# Patient Record
Sex: Male | Born: 1945 | Race: White | Hispanic: No | Marital: Married | Smoking: Never smoker
Health system: Southern US, Community
[De-identification: ages and names within clinical notes are randomized; demographics above are authoritative.]

## PROBLEM LIST (undated history)

## (undated) DIAGNOSIS — K2970 Gastritis, unspecified, without bleeding: Secondary | ICD-10-CM

## (undated) DIAGNOSIS — F0280 Dementia in other diseases classified elsewhere without behavioral disturbance: Secondary | ICD-10-CM

## (undated) DIAGNOSIS — G20A1 Parkinson's disease without dyskinesia, without mention of fluctuations: Secondary | ICD-10-CM

## (undated) DIAGNOSIS — G629 Polyneuropathy, unspecified: Secondary | ICD-10-CM

## (undated) DIAGNOSIS — M5030 Other cervical disc degeneration, unspecified cervical region: Secondary | ICD-10-CM

## (undated) DIAGNOSIS — G20 Parkinson's disease: Secondary | ICD-10-CM

## (undated) DIAGNOSIS — G459 Transient cerebral ischemic attack, unspecified: Secondary | ICD-10-CM

## (undated) DIAGNOSIS — G20C Parkinsonism, unspecified: Secondary | ICD-10-CM

## (undated) DIAGNOSIS — E785 Hyperlipidemia, unspecified: Secondary | ICD-10-CM

## (undated) DIAGNOSIS — IMO0002 Reserved for concepts with insufficient information to code with codable children: Secondary | ICD-10-CM

## (undated) DIAGNOSIS — E538 Deficiency of other specified B group vitamins: Secondary | ICD-10-CM

## (undated) HISTORY — DX: Parkinson's disease without dyskinesia, without mention of fluctuations: G20.A1

## (undated) HISTORY — DX: Polyneuropathy, unspecified: G62.9

## (undated) HISTORY — DX: Gastritis, unspecified, without bleeding: K29.70

## (undated) HISTORY — PX: APPENDECTOMY: SHX54

## (undated) HISTORY — PX: BACK SURGERY: SHX140

## (undated) HISTORY — DX: Transient cerebral ischemic attack, unspecified: G45.9

## (undated) HISTORY — DX: Reserved for concepts with insufficient information to code with codable children: IMO0002

## (undated) HISTORY — DX: Deficiency of other specified B group vitamins: E53.8

## (undated) HISTORY — DX: Hyperlipidemia, unspecified: E78.5

## (undated) HISTORY — DX: Parkinson's disease: G20

## (undated) HISTORY — DX: Dementia in other diseases classified elsewhere, unspecified severity, without behavioral disturbance, psychotic disturbance, mood disturbance, and anxiety: F02.80

## (undated) HISTORY — DX: Parkinsonism, unspecified: G20.C

---

## 2001-01-24 ENCOUNTER — Encounter: Payer: Self-pay | Admitting: Emergency Medicine

## 2001-01-24 ENCOUNTER — Emergency Department (HOSPITAL_COMMUNITY): Admission: EM | Admit: 2001-01-24 | Discharge: 2001-01-24 | Payer: Self-pay | Admitting: Emergency Medicine

## 2005-02-26 ENCOUNTER — Emergency Department (HOSPITAL_COMMUNITY): Admission: EM | Admit: 2005-02-26 | Discharge: 2005-02-26 | Payer: Self-pay | Admitting: Family Medicine

## 2005-03-02 ENCOUNTER — Emergency Department (HOSPITAL_COMMUNITY): Admission: EM | Admit: 2005-03-02 | Discharge: 2005-03-02 | Payer: Self-pay | Admitting: Family Medicine

## 2006-08-19 ENCOUNTER — Emergency Department (HOSPITAL_COMMUNITY): Admission: EM | Admit: 2006-08-19 | Discharge: 2006-08-19 | Payer: Self-pay | Admitting: Family Medicine

## 2006-09-28 ENCOUNTER — Encounter: Admission: RE | Admit: 2006-09-28 | Discharge: 2006-09-28 | Payer: Self-pay | Admitting: Neurosurgery

## 2006-10-02 ENCOUNTER — Ambulatory Visit (HOSPITAL_COMMUNITY): Admission: RE | Admit: 2006-10-02 | Discharge: 2006-10-03 | Payer: Self-pay | Admitting: Neurosurgery

## 2007-01-05 ENCOUNTER — Encounter: Admission: RE | Admit: 2007-01-05 | Discharge: 2007-02-04 | Payer: Self-pay | Admitting: Neurology

## 2008-11-01 ENCOUNTER — Encounter: Admission: RE | Admit: 2008-11-01 | Discharge: 2008-11-01 | Payer: Self-pay | Admitting: Family Medicine

## 2008-11-06 ENCOUNTER — Emergency Department (HOSPITAL_COMMUNITY): Admission: EM | Admit: 2008-11-06 | Discharge: 2008-11-06 | Payer: Self-pay | Admitting: Emergency Medicine

## 2008-11-24 ENCOUNTER — Encounter: Admission: RE | Admit: 2008-11-24 | Discharge: 2008-11-24 | Payer: Self-pay | Admitting: Gastroenterology

## 2008-12-07 LAB — HM COLONOSCOPY

## 2010-03-25 ENCOUNTER — Encounter (HOSPITAL_COMMUNITY): Admission: RE | Admit: 2010-03-25 | Discharge: 2010-04-24 | Payer: Self-pay | Admitting: Family Medicine

## 2010-04-25 ENCOUNTER — Encounter (HOSPITAL_COMMUNITY): Admission: RE | Admit: 2010-04-25 | Discharge: 2010-05-25 | Payer: Self-pay | Admitting: Family Medicine

## 2010-12-19 LAB — DIFFERENTIAL
Basophils Absolute: 0 10*3/uL (ref 0.0–0.1)
Eosinophils Absolute: 0.1 10*3/uL (ref 0.0–0.7)
Lymphocytes Relative: 18 % (ref 12–46)
Lymphs Abs: 0.9 10*3/uL (ref 0.7–4.0)
Monocytes Absolute: 0.5 10*3/uL (ref 0.1–1.0)
Neutro Abs: 3.7 10*3/uL (ref 1.7–7.7)

## 2010-12-19 LAB — CBC
HCT: 38.7 % — ABNORMAL LOW (ref 39.0–52.0)
MCHC: 34.8 g/dL (ref 30.0–36.0)
MCV: 86.6 fL (ref 78.0–100.0)
Platelets: 194 10*3/uL (ref 150–400)
RDW: 14 % (ref 11.5–15.5)
WBC: 5.3 10*3/uL (ref 4.0–10.5)

## 2010-12-19 LAB — COMPREHENSIVE METABOLIC PANEL
AST: 24 U/L (ref 0–37)
Alkaline Phosphatase: 64 U/L (ref 39–117)
Chloride: 102 mEq/L (ref 96–112)
Glucose, Bld: 99 mg/dL (ref 70–99)
Potassium: 4.2 mEq/L (ref 3.5–5.1)
Sodium: 138 mEq/L (ref 135–145)
Total Protein: 5.9 g/dL — ABNORMAL LOW (ref 6.0–8.3)

## 2010-12-19 LAB — URINALYSIS, ROUTINE W REFLEX MICROSCOPIC
Bilirubin Urine: NEGATIVE
Hgb urine dipstick: NEGATIVE
pH: 6 (ref 5.0–8.0)

## 2010-12-19 LAB — URINE MICROSCOPIC-ADD ON

## 2011-01-24 NOTE — Op Note (Signed)
Alex Frazier, Alex Frazier                ACCOUNT NO.:  [id number]   MEDICAL RECORD NO.:  [id number]          PATIENT TYPE:  OIB   LOCATION:  3002                         FACILITY:  [facility]   PHYSICIAN:  Kathaleen Maser. Pool, M.D.    DATE OF BIRTH:  07/29/1946   DATE OF PROCEDURE:  10/02/2006  DATE OF DISCHARGE:                               OPERATIVE REPORT   PREOPERATIVE DIAGNOSIS:  L4-L5 stenosis.   POSTOPERATIVE DIAGNOSIS:  L4-L5 stenosis.   PROCEDURE NOTE:  Bilateral L4-L5 decompressive laminotomies and  foraminotomies.   SURGEON:  Kathaleen Maser. Pool, M.D.   ASSISTANT:  Tia Alert, M.D.   ANESTHESIA:  General endotracheal anesthesia.   INDICATIONS FOR PROCEDURE:  Mr. Code is a 65 year old male with a  history of back and bilateral lower extremity pain, paresthesias and  weakness consistent with L5 radiculopathies bilaterally.  The patient  failed conservative management.  Workup demonstrates evidence of lateral  recess stenosis bilaterally affecting both L5 nerve roots.  The patient  presents now for bilateral decompressive laminotomies and foraminotomies  in hopes of improving his symptoms.   OPERATIVE NOTE:  The patient was taken to the operating room and placed  on the operating table in the supine position.  After an adequate level  of anesthesia was achieved, the patient was positioned prone onto a  Wilson frame and appropriately padded.  The patient's lumbar region was  prepped and draped sterilely.  A 10 blade was used to make a linear  incision overlying L4-L5 interspace.  This was carried down sharply and  subperiosteal dissection performed bilaterally exposing the lamina and  facet joints of L4-L5.  Deep self-retaining retractor was placed.  Interoperative x-ray was taken and the level was confirmed.   Decompressive laminotomies were then performed bilaterally using a high  speed drill and Kerrison rongeurs to remove the inferior aspect of the  lamina L4, medial aspect of  the L4-L5 facet joint, and the superior rim  of L5 lamina.  The ligamentum flavum was then elevated and resected in  piecemeal fashion using Kerrison rongeurs.  The underlying thecal sac  and exiting L5 nerve roots were identified.  Wide decompressive  foraminotomy was then performed along the course of the exiting L5 nerve  roots bilaterally.  At this point, a very thorough decompression had  been achieved.  The disc space was examined and found to be free from  herniation.  There was no residual stenosis.  There was no evidence of  injury to the thecal sac or nerve roots.  The wound was then irrigated  with antibiotic solution.  Gelfoam was placed topically for hemostasis  which was found to be good.  The microscope and  retractor system were removed.  Hemostasis in the muscle was achieved  with electrocautery. The wound was then closed in layers with Vicryl  sutures.  Steri-Strips and sterile dressing were applied.  There were no  interoperative complications.  The patient tolerated the procedure well  and he returns to the recovery room for postoperative care.  ______________________________  Kathaleen Maser Pool, M.D.     HAP/MEDQ  D:  10/02/2006  T:  10/02/2006  Job:  [id number]

## 2011-03-18 ENCOUNTER — Encounter: Payer: Self-pay | Admitting: Family Medicine

## 2011-03-18 DIAGNOSIS — G20 Parkinson's disease: Secondary | ICD-10-CM | POA: Insufficient documentation

## 2011-03-18 DIAGNOSIS — K2970 Gastritis, unspecified, without bleeding: Secondary | ICD-10-CM | POA: Insufficient documentation

## 2011-03-18 DIAGNOSIS — IMO0002 Reserved for concepts with insufficient information to code with codable children: Secondary | ICD-10-CM | POA: Insufficient documentation

## 2011-03-18 DIAGNOSIS — E785 Hyperlipidemia, unspecified: Secondary | ICD-10-CM | POA: Insufficient documentation

## 2011-07-08 DIAGNOSIS — G609 Hereditary and idiopathic neuropathy, unspecified: Secondary | ICD-10-CM | POA: Insufficient documentation

## 2011-07-08 DIAGNOSIS — G20A1 Parkinson's disease without dyskinesia, without mention of fluctuations: Secondary | ICD-10-CM | POA: Insufficient documentation

## 2011-07-08 DIAGNOSIS — G20C Parkinsonism, unspecified: Secondary | ICD-10-CM | POA: Insufficient documentation

## 2012-01-12 DIAGNOSIS — E785 Hyperlipidemia, unspecified: Secondary | ICD-10-CM | POA: Diagnosis not present

## 2012-01-12 DIAGNOSIS — G609 Hereditary and idiopathic neuropathy, unspecified: Secondary | ICD-10-CM | POA: Diagnosis not present

## 2012-01-12 DIAGNOSIS — G20 Parkinson's disease: Secondary | ICD-10-CM | POA: Diagnosis not present

## 2012-03-09 DIAGNOSIS — G20 Parkinson's disease: Secondary | ICD-10-CM | POA: Diagnosis not present

## 2012-03-09 DIAGNOSIS — G609 Hereditary and idiopathic neuropathy, unspecified: Secondary | ICD-10-CM | POA: Diagnosis not present

## 2012-05-12 ENCOUNTER — Ambulatory Visit
Admission: RE | Admit: 2012-05-12 | Discharge: 2012-05-12 | Disposition: A | Discharge status: Home / Self Care | Payer: Medicare Other | Source: Ambulatory Visit | Attending: Family Medicine | Admitting: Family Medicine

## 2012-05-12 ENCOUNTER — Other Ambulatory Visit: Payer: Self-pay | Admitting: Family Medicine

## 2012-05-12 DIAGNOSIS — T1490XA Injury, unspecified, initial encounter: Secondary | ICD-10-CM

## 2012-05-12 DIAGNOSIS — S51809A Unspecified open wound of unspecified forearm, initial encounter: Secondary | ICD-10-CM | POA: Diagnosis not present

## 2012-05-12 DIAGNOSIS — M25529 Pain in unspecified elbow: Secondary | ICD-10-CM | POA: Diagnosis not present

## 2012-05-14 DIAGNOSIS — M7150 Other bursitis, not elsewhere classified, unspecified site: Secondary | ICD-10-CM | POA: Diagnosis not present

## 2012-05-18 DIAGNOSIS — M7150 Other bursitis, not elsewhere classified, unspecified site: Secondary | ICD-10-CM | POA: Diagnosis not present

## 2012-06-02 DIAGNOSIS — Z23 Encounter for immunization: Secondary | ICD-10-CM | POA: Diagnosis not present

## 2012-07-26 DIAGNOSIS — H25019 Cortical age-related cataract, unspecified eye: Secondary | ICD-10-CM | POA: Diagnosis not present

## 2012-07-26 DIAGNOSIS — H2510 Age-related nuclear cataract, unspecified eye: Secondary | ICD-10-CM | POA: Diagnosis not present

## 2012-07-26 DIAGNOSIS — H52209 Unspecified astigmatism, unspecified eye: Secondary | ICD-10-CM | POA: Diagnosis not present

## 2012-11-07 ENCOUNTER — Encounter: Payer: Self-pay | Admitting: *Deleted

## 2012-12-14 DIAGNOSIS — G609 Hereditary and idiopathic neuropathy, unspecified: Secondary | ICD-10-CM | POA: Diagnosis not present

## 2012-12-14 DIAGNOSIS — G20 Parkinson's disease: Secondary | ICD-10-CM | POA: Diagnosis not present

## 2012-12-14 DIAGNOSIS — G20A1 Parkinson's disease without dyskinesia, without mention of fluctuations: Secondary | ICD-10-CM | POA: Diagnosis not present

## 2013-01-05 ENCOUNTER — Telehealth: Payer: Self-pay | Admitting: Family Medicine

## 2013-01-05 MED ORDER — CARBIDOPA-LEVODOPA 25-100 MG PO TABS: 1.0000 | ORAL_TABLET | Freq: Three times a day (TID) | ORAL | Status: DC

## 2013-01-05 NOTE — Telephone Encounter (Signed)
Rx Refilled  

## 2013-01-07 ENCOUNTER — Encounter: Payer: Self-pay | Admitting: Family Medicine

## 2013-01-07 ENCOUNTER — Ambulatory Visit (INDEPENDENT_AMBULATORY_CARE_PROVIDER_SITE_OTHER): Payer: Medicare Other | Admitting: Family Medicine

## 2013-01-07 VITALS — BP 110/72 | HR 70 | Temp 98.0°F | Resp 14 | Wt 165.0 lb

## 2013-01-07 DIAGNOSIS — E785 Hyperlipidemia, unspecified: Secondary | ICD-10-CM

## 2013-01-07 LAB — BASIC METABOLIC PANEL
BUN: 17 mg/dL (ref 6–23)
Creat: 0.87 mg/dL (ref 0.50–1.35)
Potassium: 4.9 mEq/L (ref 3.5–5.3)
Sodium: 140 mEq/L (ref 135–145)

## 2013-01-07 LAB — HEPATIC FUNCTION PANEL
ALT: 25 U/L (ref 0–53)
AST: 24 U/L (ref 0–37)
Alkaline Phosphatase: 86 U/L (ref 39–117)
Bilirubin, Direct: 0.1 mg/dL (ref 0.0–0.3)
Total Bilirubin: 0.5 mg/dL (ref 0.3–1.2)

## 2013-01-07 LAB — LIPID PANEL
Cholesterol: 135 mg/dL (ref 0–200)
HDL: 41 mg/dL (ref >39)

## 2013-01-07 NOTE — Progress Notes (Signed)
Subjective:    Patient ID: Alex Frazier, male    DOB: [birth year], 67 y.o.   MRN: [id number]  HPI Patient is here for followup of his hyperlipidemia. He is currently taking pravastatin 80 mg by mouth daily. He denies any myalgias or right quadrant pain. Of note his Parkinson's disease seems to be worsening. He hasn't a pronounced resting tremor in his right upper extremity. He is reporting more disequilibrium. Having more frequent falls. He is currently taking Sinemet immediate release 25/100 by mouth 3 times a day.  He is also taking Cymbalta 60 mg a day and let us 75 mg twice a day for neuropathy.  He is scheduled to see the Parkinson's disease specialist at [facility] in July.  Past Medical History  Diagnosis Date  . Bulging discs   . Gastritis   . Parkinsonian syndrome   . Hyperlipidemia   . Peripheral neuropathy   . B12 deficiency     Borderline   Current Outpatient Prescriptions on File Prior to Visit  Medication Sig Dispense Refill  . aspirin 325 MG tablet Take 325 mg by mouth daily.        . carbidopa-levodopa (SINEMET IR) 25-100 MG per tablet Take 1 tablet by mouth 3 (three) times daily.  90 tablet  5  . DULoxetine (CYMBALTA) 60 MG capsule Take 60 mg by mouth daily.      . pravastatin (PRAVACHOL) 80 MG tablet Take 80 mg by mouth daily.         No current facility-administered medications on file prior to visit.   Allergies  Allergen Reactions  . Cymbalta (Duloxetine Hcl)   . Indomethacin   . Lyrica (Pregabalin)    History   Social History  . Marital Status: Married    Spouse Name: N/A    Number of Children: N/A  . Years of Education: N/A   Occupational History  . Not on file.   Social History Main Topics  . Smoking status: Never Smoker   . Smokeless tobacco: Not on file  . Alcohol Use: No  . Drug Use: No  . Sexually Active: Not on file   Other Topics Concern  . Not on file   Social History Narrative  . No narrative on file      Review of  Systems  All other systems reviewed and are negative.       Objective:   Physical Exam  Vitals reviewed. Constitutional: He is oriented to person, place, and time. He appears well-developed and well-nourished.  HENT:  Head: Normocephalic.  Right Ear: External ear normal.  Left Ear: External ear normal.  Nose: Nose normal.  Mouth/Throat: Oropharynx is clear and moist.  Neck: Neck supple. No JVD present. No thyromegaly present.  Cardiovascular: Normal rate, regular rhythm and normal heart sounds.   No murmur heard. Pulmonary/Chest: Effort normal and breath sounds normal. No respiratory distress. He has no wheezes. He has no rales.  Abdominal: Soft. Bowel sounds are normal. He exhibits no distension. There is no tenderness. There is no rebound.  Lymphadenopathy:    He has no cervical adenopathy.  Neurological: He is alert and oriented to person, place, and time.   patient has a flat affect. He also has slowing of his facial expressions. He has decreased eye blinking. He has a resting tremor in his entire right upper extremity.  He has a slightly ataxic gait. He is walking with a cane.        Assessment & Plan:  1. HLD (hyperlipidemia) Check a lipid panel. His goal LDL is less than 1:30. From a general medical standpoint patient seems to be stable. I am concerned about his worsening symptoms related to Parkinson's.  We could consider increasing the Sinemet versus adding Mirapex. I would like to defer this decision to the specialist at [facility]. If there is a delay in seeing a specialist we could empirically try one of these options to this clinic. - Basic Metabolic Panel - Hepatic Function Panel - Lipid Panel

## 2013-03-25 DIAGNOSIS — G20 Parkinson's disease: Secondary | ICD-10-CM | POA: Diagnosis not present

## 2013-03-25 DIAGNOSIS — G4752 REM sleep behavior disorder: Secondary | ICD-10-CM | POA: Diagnosis not present

## 2013-03-30 ENCOUNTER — Encounter: Payer: Self-pay | Admitting: Family Medicine

## 2013-03-30 ENCOUNTER — Telehealth: Payer: Self-pay | Admitting: Family Medicine

## 2013-03-31 MED ORDER — PREGABALIN 75 MG PO CAPS: 75.0000 mg | ORAL_CAPSULE | Freq: Two times a day (BID) | ORAL | Status: DC

## 2013-03-31 MED ORDER — DULOXETINE HCL 60 MG PO CPEP: 60.0000 mg | ORAL_CAPSULE | Freq: Every day | ORAL | Status: DC

## 2013-03-31 NOTE — Telephone Encounter (Signed)
Rx Refilled and lyrica faxed

## 2013-04-04 ENCOUNTER — Encounter: Payer: Self-pay | Admitting: Family Medicine

## 2013-04-04 ENCOUNTER — Ambulatory Visit (INDEPENDENT_AMBULATORY_CARE_PROVIDER_SITE_OTHER): Payer: Medicare Other | Admitting: Family Medicine

## 2013-04-04 VITALS — BP 110/68 | HR 78 | Temp 98.2°F | Resp 16 | Wt 167.0 lb

## 2013-04-04 DIAGNOSIS — G629 Polyneuropathy, unspecified: Secondary | ICD-10-CM | POA: Insufficient documentation

## 2013-04-04 DIAGNOSIS — L723 Sebaceous cyst: Secondary | ICD-10-CM

## 2013-04-04 MED ORDER — SULFAMETHOXAZOLE-TMP DS 800-160 MG PO TABS: 1.0000 | ORAL_TABLET | Freq: Two times a day (BID) | ORAL | Status: DC

## 2013-04-04 NOTE — Progress Notes (Signed)
  Subjective:    Patient ID: Alex Frazier, male    DOB: 1946/02/17, 67 y.o.   MRN: [id number]  HPI  Patient has had a history of a small sebaceous cyst on the right posterior neck around the level of C5 for years.  Recently he began "picking" at the cyst.  The area is now red hot fluctuant and painful for 4 days. He denies fevers chills nausea or vomiting.  He denies headache or neck pain.  Past Medical History  Diagnosis Date  . Bulging discs   . Gastritis   . Parkinsonian syndrome   . Hyperlipidemia   . Peripheral neuropathy   . B12 deficiency     Borderline  . Neuropathy    Current Outpatient Prescriptions on File Prior to Visit  Medication Sig Dispense Refill  . aspirin 325 MG tablet Take 325 mg by mouth daily.        Marland Kitchen docusate sodium (COLACE) 50 MG capsule Take by mouth 2 (two) times daily.      . DULoxetine (CYMBALTA) 60 MG capsule Take 1 capsule (60 mg total) by mouth daily.  90 capsule  2  . Fish Oil-Cholecalciferol (OMEGA-3 FISH OIL/VITAMIN D3) 1000-1000 MG-UNIT CAPS Take by mouth.      . pravastatin (PRAVACHOL) 80 MG tablet Take 80 mg by mouth daily.        . pregabalin (LYRICA) 75 MG capsule Take 1 capsule (75 mg total) by mouth 2 (two) times daily.  180 capsule  2   No current facility-administered medications on file prior to visit.   Allergies  Allergen Reactions  . Cymbalta (Duloxetine Hcl)   . Indomethacin   . Lyrica (Pregabalin)    History   Social History  . Marital Status: Married    Spouse Name: N/A    Number of Children: N/A  . Years of Education: N/A   Occupational History  . Not on file.   Social History Main Topics  . Smoking status: Never Smoker   . Smokeless tobacco: Not on file  . Alcohol Use: No  . Drug Use: No  . Sexually Active: Not on file   Other Topics Concern  . Not on file   Social History Narrative  . No narrative on file     Review of Systems  All other systems reviewed and are negative.       Objective:   Physical Exam  Constitutional: He appears well-developed and well-nourished.  Cardiovascular: Normal rate and regular rhythm.   Pulmonary/Chest: Effort normal and breath sounds normal.  Skin: Skin is warm. There is erythema.   3 cm red, hot, inflamed sebaceous cyst on the right posterior neck at the level of C5.        Assessment & Plan:  1. Inflamed sebaceous cyst The area was anesthetized with 0.1% lidocaine without epinephrine.  A 4 mm cruciate incision was made.  Copious purulent exudate was expressed and a wound culture was sent. The cyst cavity was then probed with Q-tips soaked in hydrogen peroxide.  The cavity was packed with 10 cm of one quarter-inch iodoform gauze.  Wound care was discussed. The wound was dressed with gauze and Coban.  I sent a prescription for Bactrim 1 by mouth twice a day for 7 days to their pharmacy. However I instructed them not to fill the prescription and less signs of infection develop as I feel the incision and drainage is sufficient.  Followup in one week or sooner if worsening.

## 2013-04-04 NOTE — Addendum Note (Signed)
Addended by: Reginia Forts on: 04/04/2013 02:58 PM   Modules accepted: Orders

## 2013-04-08 LAB — CULTURE, ROUTINE-ABSCESS

## 2013-05-05 DIAGNOSIS — G20 Parkinson's disease: Secondary | ICD-10-CM | POA: Diagnosis not present

## 2013-05-11 DIAGNOSIS — G20 Parkinson's disease: Secondary | ICD-10-CM | POA: Diagnosis not present

## 2013-06-16 ENCOUNTER — Ambulatory Visit (INDEPENDENT_AMBULATORY_CARE_PROVIDER_SITE_OTHER): Payer: Medicare Other | Admitting: *Deleted

## 2013-06-16 VITALS — Temp 98.5°F

## 2013-06-16 DIAGNOSIS — Z23 Encounter for immunization: Secondary | ICD-10-CM

## 2013-07-08 ENCOUNTER — Telehealth: Payer: Self-pay | Admitting: Family Medicine

## 2013-07-08 MED ORDER — PRAVASTATIN SODIUM 80 MG PO TABS: 80.0000 mg | ORAL_TABLET | Freq: Every day | ORAL | Status: DC

## 2013-07-08 NOTE — Telephone Encounter (Signed)
Medication refilled per protocol. 

## 2013-07-11 ENCOUNTER — Encounter: Payer: Self-pay | Admitting: Family Medicine

## 2013-07-11 ENCOUNTER — Ambulatory Visit (INDEPENDENT_AMBULATORY_CARE_PROVIDER_SITE_OTHER): Payer: Medicare Other | Admitting: Family Medicine

## 2013-07-11 VITALS — BP 126/74 | HR 68 | Temp 97.8°F | Resp 14 | Wt 170.0 lb

## 2013-07-11 DIAGNOSIS — E785 Hyperlipidemia, unspecified: Secondary | ICD-10-CM

## 2013-07-11 LAB — COMPLETE METABOLIC PANEL WITH GFR
AST: 21 U/L (ref 0–37)
Albumin: 4.1 g/dL (ref 3.5–5.2)
BUN: 12 mg/dL (ref 6–23)
Calcium: 9.5 mg/dL (ref 8.4–10.5)
Chloride: 104 mEq/L (ref 96–112)
Glucose, Bld: 90 mg/dL (ref 70–99)
Potassium: 5.3 mEq/L (ref 3.5–5.3)
Total Protein: 6.4 g/dL (ref 6.0–8.3)

## 2013-07-11 LAB — CBC WITH DIFFERENTIAL/PLATELET
Eosinophils Absolute: 0.1 10*3/uL (ref 0.0–0.7)
Hemoglobin: 14.8 g/dL (ref 13.0–17.0)
Lymphocytes Relative: 26 % (ref 12–46)
Lymphs Abs: 1.1 10*3/uL (ref 0.7–4.0)
MCH: 30.1 pg (ref 26.0–34.0)
Monocytes Relative: 12 % (ref 3–12)
Neutro Abs: 2.4 10*3/uL (ref 1.7–7.7)
Neutrophils Relative %: 60 % (ref 43–77)
RBC: 4.92 MIL/uL (ref 4.22–5.81)
WBC: 4 10*3/uL (ref 4.0–10.5)

## 2013-07-11 LAB — LIPID PANEL
Cholesterol: 135 mg/dL (ref 0–200)
VLDL: 30 mg/dL (ref 0–40)

## 2013-07-11 NOTE — Progress Notes (Signed)
Subjective:    Patient ID: Alex Frazier, male    DOB: [birth year], 67 y.o.   MRN: [id number]  HPI  Patient is here today for followup of his hyperlipidemia. He is currently taking Pravachol 80 mg by mouth daily. He denies any myalgia or right upper quadrant pain. He does suffer from Parkinson's disease. He also suffers from peripheral neuropathy. The side effects of his medical conditions are unchanged. He has had his flu shot. He is due for Prevnar 13. Past Medical History  Diagnosis Date  . Bulging discs   . Gastritis   . Parkinsonian syndrome   . Hyperlipidemia   . Peripheral neuropathy   . B12 deficiency     Borderline  . Neuropathy    Current Outpatient Prescriptions on File Prior to Visit  Medication Sig Dispense Refill  . aspirin 325 MG tablet Take 325 mg by mouth daily.        . carbidopa-levodopa (SINEMET IR) 25-100 MG per tablet Take 2 tablets by mouth 3 (three) times daily.       . clonazePAM (KLONOPIN) 0.5 MG tablet Take 0.5 mg by mouth 2 (two) times daily as needed for anxiety.      . docusate sodium (COLACE) 50 MG capsule Take by mouth 2 (two) times daily.      . DULoxetine (CYMBALTA) 60 MG capsule Take 1 capsule (60 mg total) by mouth daily.  90 capsule  2  . Fish Oil-Cholecalciferol (OMEGA-3 FISH OIL/VITAMIN D3) 1000-1000 MG-UNIT CAPS Take by mouth.      . pravastatin (PRAVACHOL) 80 MG tablet Take 1 tablet (80 mg total) by mouth daily.  90 tablet  1  . pregabalin (LYRICA) 75 MG capsule Take 1 capsule (75 mg total) by mouth 2 (two) times daily.  180 capsule  2   No current facility-administered medications on file prior to visit.   Allergies  Allergen Reactions  . Cymbalta [Duloxetine Hcl]   . Indomethacin   . Lyrica [Pregabalin]    History   Social History  . Marital Status: Married    Spouse Name: N/A    Number of Children: N/A  . Years of Education: N/A   Occupational History  . Not on file.   Social History Main Topics  . Smoking status: Never  Smoker   . Smokeless tobacco: Not on file  . Alcohol Use: No  . Drug Use: No  . Sexual Activity: Not on file   Other Topics Concern  . Not on file   Social History Narrative  . No narrative on file     Review of Systems  All other systems reviewed and are negative.       Objective:   Physical Exam  Vitals reviewed. HENT:  Mouth/Throat: No oropharyngeal exudate.  Eyes: Conjunctivae are normal. No scleral icterus.  Neck: Neck supple. No JVD present. No thyromegaly present.  Cardiovascular: Normal rate, regular rhythm and normal heart sounds.  Exam reveals no gallop and no friction rub.   No murmur heard. Pulmonary/Chest: Effort normal and breath sounds normal. No respiratory distress. He has no wheezes. He has no rales. He exhibits no tenderness.  Abdominal: Soft. Bowel sounds are normal. He exhibits no distension. There is no tenderness. There is no rebound.  Musculoskeletal: He exhibits no edema.  Lymphadenopathy:    He has no cervical adenopathy.    Patient has bradykinesia. He has no resting tremor. His movement seems more fluid since they have increased the dose of Sinemet  Assessment & Plan:  1. HLD (hyperlipidemia) , Check a fasting lipid panel. His goal LDL is less than 130. I recommended the Prevnar 13 for pneumonia prevention. The remainder of his immunizations and preventive care are up to date - COMPLETE METABOLIC PANEL WITH GFR - Lipid panel - CBC with Differential

## 2013-07-13 ENCOUNTER — Other Ambulatory Visit: Payer: Self-pay | Admitting: Family Medicine

## 2013-07-13 ENCOUNTER — Encounter: Payer: Self-pay | Admitting: Family Medicine

## 2013-07-13 MED ORDER — PRAVASTATIN SODIUM 80 MG PO TABS: 80.0000 mg | ORAL_TABLET | Freq: Every day | ORAL | Status: DC

## 2013-07-13 NOTE — Telephone Encounter (Signed)
Rx Refilled  

## 2013-08-01 DIAGNOSIS — H25019 Cortical age-related cataract, unspecified eye: Secondary | ICD-10-CM | POA: Diagnosis not present

## 2013-08-01 DIAGNOSIS — D2310 Other benign neoplasm of skin of unspecified eyelid, including canthus: Secondary | ICD-10-CM | POA: Diagnosis not present

## 2013-08-01 DIAGNOSIS — H2510 Age-related nuclear cataract, unspecified eye: Secondary | ICD-10-CM | POA: Diagnosis not present

## 2013-08-17 DIAGNOSIS — G20 Parkinson's disease: Secondary | ICD-10-CM | POA: Diagnosis not present

## 2013-09-20 DIAGNOSIS — G589 Mononeuropathy, unspecified: Secondary | ICD-10-CM | POA: Diagnosis not present

## 2013-09-20 DIAGNOSIS — G20 Parkinson's disease: Secondary | ICD-10-CM | POA: Diagnosis not present

## 2013-09-20 DIAGNOSIS — M216X9 Other acquired deformities of unspecified foot: Secondary | ICD-10-CM | POA: Diagnosis not present

## 2013-10-10 ENCOUNTER — Other Ambulatory Visit: Payer: Self-pay | Admitting: Family Medicine

## 2013-10-10 MED ORDER — PREGABALIN 75 MG PO CAPS: 75.0000 mg | ORAL_CAPSULE | Freq: Two times a day (BID) | ORAL | Status: DC

## 2013-10-10 NOTE — Telephone Encounter (Signed)
rx was printed and faxed to pharmacy 

## 2013-10-18 ENCOUNTER — Ambulatory Visit (INDEPENDENT_AMBULATORY_CARE_PROVIDER_SITE_OTHER): Payer: Medicare Other | Admitting: Family Medicine

## 2013-10-18 DIAGNOSIS — Z23 Encounter for immunization: Secondary | ICD-10-CM | POA: Diagnosis not present

## 2014-01-09 ENCOUNTER — Ambulatory Visit: Payer: Medicare Other | Admitting: Family Medicine

## 2014-01-09 ENCOUNTER — Ambulatory Visit (INDEPENDENT_AMBULATORY_CARE_PROVIDER_SITE_OTHER): Payer: Medicare Other | Admitting: Family Medicine

## 2014-01-09 ENCOUNTER — Encounter: Payer: Self-pay | Admitting: Family Medicine

## 2014-01-09 VITALS — BP 122/74 | HR 64 | Temp 97.7°F | Resp 16 | Ht 67.0 in | Wt 169.0 lb

## 2014-01-09 DIAGNOSIS — E785 Hyperlipidemia, unspecified: Secondary | ICD-10-CM

## 2014-01-09 MED ORDER — FLUTICASONE PROPIONATE 50 MCG/ACT NA SUSP: 2.0000 | Freq: Every day | NASAL | Status: DC

## 2014-01-09 NOTE — Progress Notes (Signed)
Subjective:    Patient ID: Alex Frazier, male    DOB: 14-Jul-1946, 68 y.o.   MRN: [id number]  HPI   Patient is here today for followup of his hyperlipidemia. He is currently taking Pravachol 80 mg by mouth daily. He denies any myalgia or right upper quadrant pain. He does suffer from Parkinson's disease. He also suffers from peripheral neuropathy. Past Medical History  Diagnosis Date  . Bulging discs   . Gastritis   . Parkinsonian syndrome   . Hyperlipidemia   . Peripheral neuropathy   . B12 deficiency     Borderline  . Neuropathy    Current Outpatient Prescriptions on File Prior to Visit  Medication Sig Dispense Refill  . aspirin 325 MG tablet Take 325 mg by mouth daily.        . carbidopa-levodopa (SINEMET IR) 25-100 MG per tablet Take 2 tablets by mouth 3 (three) times daily.       . clonazePAM (KLONOPIN) 0.5 MG tablet Take 0.5 mg by mouth 2 (two) times daily as needed for anxiety.      . docusate sodium (COLACE) 50 MG capsule Take by mouth 2 (two) times daily.      . DULoxetine (CYMBALTA) 60 MG capsule Take 1 capsule (60 mg total) by mouth daily.  90 capsule  2  . Fish Oil-Cholecalciferol (OMEGA-3 FISH OIL/VITAMIN D3) 1000-1000 MG-UNIT CAPS Take by mouth.      . pravastatin (PRAVACHOL) 80 MG tablet Take 1 tablet (80 mg total) by mouth daily.  90 tablet  3  . pregabalin (LYRICA) 75 MG capsule Take 1 capsule (75 mg total) by mouth 2 (two) times daily.  180 capsule  4   No current facility-administered medications on file prior to visit.   Allergies  Allergen Reactions  . Cymbalta [Duloxetine Hcl]   . Indomethacin   . Lyrica [Pregabalin]    History   Social History  . Marital Status: Married    Spouse Name: N/A    Number of Children: N/A  . Years of Education: N/A   Occupational History  . Not on file.   Social History Main Topics  . Smoking status: Never Smoker   . Smokeless tobacco: Not on file  . Alcohol Use: No  . Drug Use: No  . Sexual Activity: Not on  file   Other Topics Concern  . Not on file   Social History Narrative  . No narrative on file     Review of Systems  All other systems reviewed and are negative.      Objective:   Physical Exam  Vitals reviewed. HENT:  Mouth/Throat: No oropharyngeal exudate.  Eyes: Conjunctivae are normal. No scleral icterus.  Neck: Neck supple. No JVD present. No thyromegaly present.  Cardiovascular: Normal rate, regular rhythm and normal heart sounds.  Exam reveals no gallop and no friction rub.   No murmur heard. Pulmonary/Chest: Effort normal and breath sounds normal. No respiratory distress. He has no wheezes. He has no rales. He exhibits no tenderness.  Abdominal: Soft. Bowel sounds are normal. He exhibits no distension. There is no tenderness. There is no rebound.  Musculoskeletal: He exhibits no edema.  Lymphadenopathy:    He has no cervical adenopathy.    Patient has bradykinesia. He has no resting tremor. His movement seems more fluid since they have increased the dose of Sinemet      Assessment & Plan:  1. HLD (hyperlipidemia) , Check a fasting lipid panel. His goal LDL is  less than 130. The remainder of his immunizations and preventive care are up to date - COMPLETE METABOLIC PANEL WITH GFR - Lipid panel - CBC with Differential

## 2014-01-11 ENCOUNTER — Other Ambulatory Visit: Payer: Medicare Other

## 2014-01-11 DIAGNOSIS — E785 Hyperlipidemia, unspecified: Secondary | ICD-10-CM

## 2014-01-11 LAB — COMPLETE METABOLIC PANEL WITH GFR
ALK PHOS: 126 U/L — AB (ref 39–117)
ALT: 9 U/L (ref 0–53)
AST: 24 U/L (ref 0–37)
Albumin: 4.2 g/dL (ref 3.5–5.2)
BUN: 24 mg/dL — AB (ref 6–23)
CO2: 26 mEq/L (ref 19–32)
CREATININE: 0.9 mg/dL (ref 0.50–1.35)
Calcium: 9.1 mg/dL (ref 8.4–10.5)
Chloride: 101 mEq/L (ref 96–112)
GFR, Est African American: >89 mL/min
GFR, Est Non African American: 88 mL/min
Glucose, Bld: 97 mg/dL (ref 70–99)
Potassium: 4.2 mEq/L (ref 3.5–5.3)
Sodium: 137 mEq/L (ref 135–145)
Total Bilirubin: 0.7 mg/dL (ref 0.2–1.2)
Total Protein: 6.8 g/dL (ref 6.0–8.3)

## 2014-01-11 LAB — LIPID PANEL
CHOLESTEROL: 126 mg/dL (ref 0–200)
HDL: 39 mg/dL — ABNORMAL LOW (ref >39)
LDL Cholesterol: 75 mg/dL (ref 0–99)
TRIGLYCERIDES: 62 mg/dL (ref <150)
Total CHOL/HDL Ratio: 3.2 Ratio
VLDL: 12 mg/dL (ref 0–40)

## 2014-01-12 ENCOUNTER — Other Ambulatory Visit: Payer: Self-pay | Admitting: *Deleted

## 2014-01-12 MED ORDER — DULOXETINE HCL 60 MG PO CPEP: 60.0000 mg | ORAL_CAPSULE | Freq: Every day | ORAL | Status: DC

## 2014-01-12 NOTE — Telephone Encounter (Signed)
Refill appropriate and filled per protocol. 

## 2014-01-18 ENCOUNTER — Other Ambulatory Visit: Payer: Self-pay | Admitting: Family Medicine

## 2014-01-18 MED ORDER — DULOXETINE HCL 60 MG PO CPEP: 60.0000 mg | ORAL_CAPSULE | Freq: Every day | ORAL | Status: DC

## 2014-01-18 NOTE — Telephone Encounter (Signed)
Rx Refilled  

## 2014-01-20 DIAGNOSIS — G609 Hereditary and idiopathic neuropathy, unspecified: Secondary | ICD-10-CM | POA: Diagnosis not present

## 2014-01-20 DIAGNOSIS — R259 Unspecified abnormal involuntary movements: Secondary | ICD-10-CM | POA: Diagnosis not present

## 2014-01-20 DIAGNOSIS — Z7982 Long term (current) use of aspirin: Secondary | ICD-10-CM | POA: Diagnosis not present

## 2014-01-20 DIAGNOSIS — Z9181 History of falling: Secondary | ICD-10-CM | POA: Diagnosis not present

## 2014-01-20 DIAGNOSIS — G20 Parkinson's disease: Secondary | ICD-10-CM | POA: Diagnosis not present

## 2014-01-20 DIAGNOSIS — R269 Unspecified abnormalities of gait and mobility: Secondary | ICD-10-CM | POA: Diagnosis not present

## 2014-01-31 ENCOUNTER — Other Ambulatory Visit: Payer: Medicare Other

## 2014-01-31 DIAGNOSIS — R748 Abnormal levels of other serum enzymes: Secondary | ICD-10-CM | POA: Diagnosis not present

## 2014-01-31 LAB — COMPREHENSIVE METABOLIC PANEL
ALT: 10 U/L (ref 0–53)
AST: 17 U/L (ref 0–37)
Albumin: 4 g/dL (ref 3.5–5.2)
Alkaline Phosphatase: 96 U/L (ref 39–117)
BUN: 20 mg/dL (ref 6–23)
CALCIUM: 9.2 mg/dL (ref 8.4–10.5)
CHLORIDE: 103 meq/L (ref 96–112)
CO2: 31 meq/L (ref 19–32)
Creat: 0.75 mg/dL (ref 0.50–1.35)
Glucose, Bld: 108 mg/dL — ABNORMAL HIGH (ref 70–99)
Potassium: 4.3 mEq/L (ref 3.5–5.3)
SODIUM: 140 meq/L (ref 135–145)
Total Bilirubin: 0.4 mg/dL (ref 0.2–1.2)
Total Protein: 6.2 g/dL (ref 6.0–8.3)

## 2014-01-31 LAB — GAMMA GT: GGT: 9 U/L (ref 7–51)

## 2014-04-07 ENCOUNTER — Other Ambulatory Visit: Payer: Self-pay | Admitting: Family Medicine

## 2014-04-07 MED ORDER — PREGABALIN 75 MG PO CAPS: 75.0000 mg | ORAL_CAPSULE | Freq: Two times a day (BID) | ORAL | Status: DC

## 2014-04-07 NOTE — Telephone Encounter (Signed)
Rx printed, signed and faxed to pharm 

## 2014-06-20 DIAGNOSIS — G20 Parkinson's disease: Secondary | ICD-10-CM | POA: Diagnosis not present

## 2014-06-20 DIAGNOSIS — G629 Polyneuropathy, unspecified: Secondary | ICD-10-CM | POA: Diagnosis not present

## 2014-06-29 ENCOUNTER — Ambulatory Visit (INDEPENDENT_AMBULATORY_CARE_PROVIDER_SITE_OTHER): Payer: Medicare Other | Admitting: *Deleted

## 2014-06-29 DIAGNOSIS — Z23 Encounter for immunization: Secondary | ICD-10-CM | POA: Diagnosis not present

## 2014-06-30 ENCOUNTER — Other Ambulatory Visit: Payer: Self-pay | Admitting: Family Medicine

## 2014-06-30 MED ORDER — PRAVASTATIN SODIUM 80 MG PO TABS: 80.0000 mg | ORAL_TABLET | Freq: Every day | ORAL | Status: DC

## 2014-06-30 NOTE — Telephone Encounter (Signed)
med sent to [facility]

## 2014-07-13 ENCOUNTER — Ambulatory Visit: Payer: Medicare Other | Admitting: Family Medicine

## 2014-08-01 ENCOUNTER — Ambulatory Visit (INDEPENDENT_AMBULATORY_CARE_PROVIDER_SITE_OTHER): Payer: Medicare Other | Admitting: Family Medicine

## 2014-08-01 ENCOUNTER — Encounter: Payer: Self-pay | Admitting: Family Medicine

## 2014-08-01 VITALS — BP 128/72 | HR 70 | Temp 97.9°F | Resp 16 | Ht 67.0 in | Wt 167.0 lb

## 2014-08-01 DIAGNOSIS — E785 Hyperlipidemia, unspecified: Secondary | ICD-10-CM

## 2014-08-02 ENCOUNTER — Encounter: Payer: Self-pay | Admitting: Family Medicine

## 2014-08-02 NOTE — Progress Notes (Signed)
Subjective:    Patient ID: Alex Frazier, male    DOB: 1945-11-23, 68 y.o.   MRN: [id number]  HPI   Patient is here today for followup of his hyperlipidemia. He is currently taking Pravachol 80 mg by mouth daily. He denies any myalgia or right upper quadrant pain. He does suffer from Parkinson's disease. He also suffers from peripheral neuropathy.  Recently his neurologist had him discontinue Lyrica. Patient has not seen any increase in his neuropathic pain since discontinuing Lyrica. Overall he is doing well and his review of systems is completely negative. He denies any chest pain, shortness of breath, dyspnea on exertion. He denies any nausea vomiting or diarrhea. He continues to have problems with motion due to his movement disorder. This is severely debilitating for the patient. Past Medical History  Diagnosis Date  . Bulging discs   . Gastritis   . Parkinsonian syndrome   . Hyperlipidemia   . Peripheral neuropathy   . B12 deficiency     Borderline  . Neuropathy    Current Outpatient Prescriptions on File Prior to Visit  Medication Sig Dispense Refill  . aspirin 325 MG tablet Take 325 mg by mouth daily.      . carbidopa-levodopa (SINEMET IR) 25-100 MG per tablet Take 2 tablets by mouth 3 (three) times daily.     . clonazePAM (KLONOPIN) 0.5 MG tablet Take 0.5 mg by mouth 2 (two) times daily as needed for anxiety.    . docusate sodium (COLACE) 50 MG capsule Take by mouth 2 (two) times daily.    . DULoxetine (CYMBALTA) 60 MG capsule Take 1 capsule (60 mg total) by mouth daily. 90 capsule 3  . Fish Oil-Cholecalciferol (OMEGA-3 FISH OIL/VITAMIN D3) 1000-1000 MG-UNIT CAPS Take by mouth.    . fluticasone (FLONASE) 50 MCG/ACT nasal spray Place 2 sprays into both nostrils daily. 16 g 6  . pravastatin (PRAVACHOL) 80 MG tablet Take 1 tablet (80 mg total) by mouth daily. 90 tablet 3  . pregabalin (LYRICA) 75 MG capsule Take 1 capsule (75 mg total) by mouth 2 (two) times daily. (Patient not  taking: Reported on 08/01/2014) 180 capsule 4   No current facility-administered medications on file prior to visit.   Allergies  Allergen Reactions  . Cymbalta [Duloxetine Hcl]   . Indomethacin   . Lyrica [Pregabalin]    History   Social History  . Marital Status: Married    Spouse Name: N/A    Number of Children: N/A  . Years of Education: N/A   Occupational History  . Not on file.   Social History Main Topics  . Smoking status: Never Smoker   . Smokeless tobacco: Not on file  . Alcohol Use: No  . Drug Use: No  . Sexual Activity: Not on file   Other Topics Concern  . Not on file   Social History Narrative     Review of Systems  All other systems reviewed and are negative.      Objective:   Physical Exam  Constitutional: He appears well-developed and well-nourished.  HENT:  Nose: Nose normal.  Mouth/Throat: Oropharynx is clear and moist.  Eyes: Conjunctivae are normal. No scleral icterus.  Neck: Neck supple. No JVD present. No thyromegaly present.  Cardiovascular: Normal rate and regular rhythm.  Exam reveals no friction rub.   No murmur heard. Pulmonary/Chest: Effort normal and breath sounds normal. No respiratory distress. He has no wheezes. He has no rales.  Abdominal: Soft. Bowel sounds are normal.  He exhibits no distension. There is no tenderness. There is no rebound and no guarding.  Musculoskeletal: He exhibits no edema.  Lymphadenopathy:    He has no cervical adenopathy.  Skin: Skin is warm. No rash noted. No erythema. No pallor.  Vitals reviewed.   Patient has bradykinesia. He has no resting tremor. His movement seems more fluid since they have increased the dose of Sinemet      Assessment & Plan:  1. HLD (hyperlipidemia) , Check a fasting lipid panel. His goal LDL is less than 130. The remainder of his immunizations and preventive care are up to date - COMPLETE METABOLIC PANEL WITH GFR - Lipid panel - CBC with Differential

## 2014-08-07 DIAGNOSIS — H25013 Cortical age-related cataract, bilateral: Secondary | ICD-10-CM | POA: Diagnosis not present

## 2014-08-07 DIAGNOSIS — D2312 Other benign neoplasm of skin of left eyelid, including canthus: Secondary | ICD-10-CM | POA: Diagnosis not present

## 2014-08-07 DIAGNOSIS — H2513 Age-related nuclear cataract, bilateral: Secondary | ICD-10-CM | POA: Diagnosis not present

## 2014-08-09 ENCOUNTER — Other Ambulatory Visit: Payer: Medicare Other

## 2014-08-09 DIAGNOSIS — E785 Hyperlipidemia, unspecified: Secondary | ICD-10-CM

## 2014-08-09 LAB — COMPLETE METABOLIC PANEL WITH GFR
ALK PHOS: 106 U/L (ref 39–117)
ALT: 9 U/L (ref 0–53)
AST: 26 U/L (ref 0–37)
Albumin: 4.4 g/dL (ref 3.5–5.2)
BILIRUBIN TOTAL: 0.6 mg/dL (ref 0.2–1.2)
BUN: 17 mg/dL (ref 6–23)
CO2: 29 mEq/L (ref 19–32)
CREATININE: 0.81 mg/dL (ref 0.50–1.35)
Calcium: 9.5 mg/dL (ref 8.4–10.5)
Chloride: 102 mEq/L (ref 96–112)
GLUCOSE: 91 mg/dL (ref 70–99)
Potassium: 4.3 mEq/L (ref 3.5–5.3)
Sodium: 140 mEq/L (ref 135–145)
Total Protein: 7 g/dL (ref 6.0–8.3)

## 2014-08-09 LAB — LIPID PANEL
CHOLESTEROL: 130 mg/dL (ref 0–200)
HDL: 42 mg/dL (ref >39)
LDL Cholesterol: 69 mg/dL (ref 0–99)
TRIGLYCERIDES: 95 mg/dL (ref <150)
Total CHOL/HDL Ratio: 3.1 Ratio
VLDL: 19 mg/dL (ref 0–40)

## 2014-08-09 LAB — CBC WITH DIFFERENTIAL/PLATELET
Basophils Absolute: 0.1 10*3/uL (ref 0.0–0.1)
Basophils Relative: 1 % (ref 0–1)
EOS ABS: 0.1 10*3/uL (ref 0.0–0.7)
Eosinophils Relative: 1 % (ref 0–5)
HEMATOCRIT: 44.4 % (ref 39.0–52.0)
Hemoglobin: 15.5 g/dL (ref 13.0–17.0)
Lymphocytes Relative: 23 % (ref 12–46)
Lymphs Abs: 1.3 10*3/uL (ref 0.7–4.0)
MCH: 29.9 pg (ref 26.0–34.0)
MCHC: 34.9 g/dL (ref 30.0–36.0)
MCV: 85.5 fL (ref 78.0–100.0)
MONOS PCT: 8 % (ref 3–12)
MPV: 9 fL — ABNORMAL LOW (ref 9.4–12.4)
Monocytes Absolute: 0.4 10*3/uL (ref 0.1–1.0)
NEUTROS ABS: 3.8 10*3/uL (ref 1.7–7.7)
Neutrophils Relative %: 67 % (ref 43–77)
Platelets: 218 10*3/uL (ref 150–400)
RBC: 5.19 MIL/uL (ref 4.22–5.81)
RDW: 14 % (ref 11.5–15.5)
WBC: 5.6 10*3/uL (ref 4.0–10.5)

## 2014-08-10 ENCOUNTER — Encounter: Payer: Self-pay | Admitting: Family Medicine

## 2014-08-15 ENCOUNTER — Telehealth: Payer: Self-pay | Admitting: Family Medicine

## 2014-08-15 NOTE — Telephone Encounter (Signed)
Patient is calling about lab results, their computer is not working and we sent them through my chart  [phone]

## 2014-08-16 NOTE — Telephone Encounter (Signed)
Pt computer not working.  Told lab work all normal.  Make appt for 6 months.  Pt ask copy of lab be mailed to him - done

## 2014-09-14 DIAGNOSIS — G20 Parkinson's disease: Secondary | ICD-10-CM | POA: Diagnosis not present

## 2014-09-15 ENCOUNTER — Telehealth: Payer: Self-pay | Admitting: Family Medicine

## 2014-09-15 NOTE — Telephone Encounter (Signed)
Patient is calling to speak with with dr pickard or you, would not give me ANY details  [phone]

## 2014-09-18 NOTE — Telephone Encounter (Signed)
Patient would like to try viagra.  I left a box of samples for him to pick up up front, 50 mg poqday prn.

## 2014-09-18 NOTE — Telephone Encounter (Signed)
Called pt and he would not tell me what he wanted but he wants you to call him so he can talk to you about "him"

## 2014-10-10 DIAGNOSIS — G20 Parkinson's disease: Secondary | ICD-10-CM | POA: Diagnosis not present

## 2014-12-19 DIAGNOSIS — G20 Parkinson's disease: Secondary | ICD-10-CM | POA: Diagnosis not present

## 2014-12-19 DIAGNOSIS — Z79899 Other long term (current) drug therapy: Secondary | ICD-10-CM | POA: Diagnosis not present

## 2014-12-27 ENCOUNTER — Other Ambulatory Visit: Payer: Self-pay | Admitting: Family Medicine

## 2014-12-27 NOTE — Telephone Encounter (Signed)
Refill appropriate and filled per protocol. 

## 2015-01-30 ENCOUNTER — Ambulatory Visit (INDEPENDENT_AMBULATORY_CARE_PROVIDER_SITE_OTHER): Payer: Medicare Other | Admitting: Family Medicine

## 2015-01-30 ENCOUNTER — Encounter: Payer: Self-pay | Admitting: Family Medicine

## 2015-01-30 VITALS — BP 110/70 | HR 68 | Temp 98.1°F | Resp 16 | Wt 165.0 lb

## 2015-01-30 DIAGNOSIS — D485 Neoplasm of uncertain behavior of skin: Secondary | ICD-10-CM

## 2015-01-30 DIAGNOSIS — Z125 Encounter for screening for malignant neoplasm of prostate: Secondary | ICD-10-CM | POA: Diagnosis not present

## 2015-01-30 DIAGNOSIS — E785 Hyperlipidemia, unspecified: Secondary | ICD-10-CM | POA: Diagnosis not present

## 2015-01-30 LAB — COMPLETE METABOLIC PANEL WITH GFR
ALT: 12 U/L (ref 0–53)
AST: 28 U/L (ref 0–37)
Albumin: 4.1 g/dL (ref 3.5–5.2)
Alkaline Phosphatase: 98 U/L (ref 39–117)
BILIRUBIN TOTAL: 0.5 mg/dL (ref 0.2–1.2)
BUN: 23 mg/dL (ref 6–23)
CALCIUM: 8.8 mg/dL (ref 8.4–10.5)
CO2: 27 mEq/L (ref 19–32)
Chloride: 104 mEq/L (ref 96–112)
Creat: 0.79 mg/dL (ref 0.50–1.35)
Glucose, Bld: 94 mg/dL (ref 70–99)
POTASSIUM: 4.2 meq/L (ref 3.5–5.3)
Sodium: 138 mEq/L (ref 135–145)
TOTAL PROTEIN: 6.6 g/dL (ref 6.0–8.3)

## 2015-01-30 LAB — LIPID PANEL
Cholesterol: 117 mg/dL (ref 0–200)
HDL: 37 mg/dL — AB (ref >=40)
LDL Cholesterol: 63 mg/dL (ref 0–99)
Total CHOL/HDL Ratio: 3.2 Ratio
Triglycerides: 84 mg/dL (ref <150)
VLDL: 17 mg/dL (ref 0–40)

## 2015-01-30 NOTE — Progress Notes (Signed)
Subjective:    Patient ID: Alex Frazier, male    DOB: [birth year], 69 y.o.   MRN: [id number]  HPI   Patient is here today for followup of his hyperlipidemia. He is currently taking Pravachol 80 mg by mouth daily. He denies any myalgia or right upper quadrant pain. Overall he is doing well and his review of systems is completely negative. He denies any chest pain, shortness of breath, dyspnea on exertion. He denies any nausea vomiting or diarrhea. He continues to have problems with motion due to his movement disorder. Pneumovax, prevnar, and zostavax are up to date.  Colonoscopy was last in 2010.  Patient is due for a PSA/DRE.  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 Past Medical History  Diagnosis Date  . Bulging discs   . Gastritis   . Parkinsonian syndrome   . Hyperlipidemia   . Peripheral neuropathy   . B12 deficiency     Borderline  . Neuropathy    Current Outpatient Prescriptions on File Prior to Visit  Medication Sig Dispense Refill  . aspirin 325 MG tablet Take 325 mg by mouth daily.      . carbidopa-levodopa (SINEMET IR) 25-100 MG per tablet Take 2 tablets by mouth 3 (three) times daily.     . clonazePAM (KLONOPIN) 0.5 MG tablet Take 0.5 mg by mouth 2 (two) times daily as needed for anxiety.    . docusate sodium (COLACE) 50 MG capsule Take by mouth 2 (two) times daily.    . DULoxetine (CYMBALTA) 60 MG capsule TAKE 1 BY MOUTH DAILY 90 capsule 0  . Fish Oil-Cholecalciferol (OMEGA-3 FISH OIL/VITAMIN D3) 1000-1000 MG-UNIT CAPS Take by mouth.    . fluticasone (FLONASE) 50 MCG/ACT nasal spray Place 2 sprays into both nostrils daily. 16 g 6  . pravastatin (PRAVACHOL) 80 MG tablet Take 1 tablet (80 mg total) by mouth daily. 90 tablet 3  . pregabalin (LYRICA) 75 MG capsule Take 1 capsule (75 mg total) by mouth 2 (two) times  daily. (Patient not taking: Reported on 08/01/2014) 180 capsule 4   No current facility-administered medications on file prior to visit.   Allergies  Allergen Reactions  . Cymbalta [Duloxetine Hcl]   . Indomethacin   . Lyrica [Pregabalin]    History   Social History  . Marital Status: Married    Spouse Name: N/A  . Number of Children: N/A  . Years of Education: N/A   Occupational History  . Not on file.   Social History Main Topics  . Smoking status: Never Smoker   . Smokeless tobacco: Not on file  . Alcohol Use: No  . Drug Use: No  . Sexual Activity: Not on file   Other Topics Concern  . Not on file   Social History Narrative     Review of Systems  All other systems reviewed and are negative.      Objective:   Physical Exam  Constitutional: He appears well-developed and well-nourished.  HENT:  Nose: Nose normal.  Mouth/Throat: Oropharynx is clear and moist.  Eyes: Conjunctivae are normal. No scleral icterus.  Neck: Neck supple. No JVD present. No thyromegaly present.  Cardiovascular: Normal rate and regular rhythm.  Exam reveals no friction rub.   No murmur heard. Pulmonary/Chest: Effort normal and breath sounds normal. No respiratory distress. He has no  wheezes. He has no rales.  Abdominal: Soft. Bowel sounds are normal. He exhibits no distension. There is no tenderness. There is no rebound and no guarding.  Musculoskeletal: He exhibits no edema.  Lymphadenopathy:    He has no cervical adenopathy.  Skin: Skin is warm. No rash noted. No erythema. No pallor.  Vitals reviewed.   Patient has bradykinesia. He has no resting tremor. Has a slow shuffling gait and walks with a four-pronged cane.    Assessment & Plan:  HLD (hyperlipidemia) - Plan: COMPLETE METABOLIC PANEL WITH GFR, Lipid panel  Prostate cancer screening - Plan: PSA, Medicare  Neoplasm of uncertain behavior of skin   , Check a fasting lipid panel. His goal LDL is less than 130. The  remainder of his immunizations and preventive care are up to date - COMPLETE METABOLIC PANEL WITH GFR - Lipid panel  I will also check a PSA. His prostate exam today is completely normal. The patient does have external skin tags from previous hemorrhoids. Other precancerous lesions on his left temple were treated with cryotherapy using liquid nitrogen for a total of 30 seconds each. If the lesions persist I would recommend a surgical biopsy. Also recommended that the patient start performing more physical exercise at home such as stand up for Down's out of a chair using a walker for balance, biceps curls, tricep extensions, and overhead presses using low weights which is 5 pounds just a bit up his stamina and his poor muscle strength to help with his balance and prevent falling - CBC with Differential

## 2015-01-31 LAB — PSA, MEDICARE: PSA: 0.91 ng/mL (ref <=4.00)

## 2015-02-01 ENCOUNTER — Encounter: Payer: Self-pay | Admitting: Family Medicine

## 2015-03-27 DIAGNOSIS — G20 Parkinson's disease: Secondary | ICD-10-CM | POA: Diagnosis not present

## 2015-03-27 DIAGNOSIS — G629 Polyneuropathy, unspecified: Secondary | ICD-10-CM | POA: Diagnosis not present

## 2015-04-10 ENCOUNTER — Other Ambulatory Visit: Payer: Self-pay | Admitting: Family Medicine

## 2015-04-10 NOTE — Telephone Encounter (Signed)
Refill appropriate and filled per protocol. 

## 2015-04-26 DIAGNOSIS — R1310 Dysphagia, unspecified: Secondary | ICD-10-CM | POA: Diagnosis not present

## 2015-04-26 DIAGNOSIS — G20 Parkinson's disease: Secondary | ICD-10-CM | POA: Diagnosis not present

## 2015-04-26 DIAGNOSIS — R948 Abnormal results of function studies of other organs and systems: Secondary | ICD-10-CM | POA: Diagnosis not present

## 2015-04-26 DIAGNOSIS — G629 Polyneuropathy, unspecified: Secondary | ICD-10-CM | POA: Diagnosis not present

## 2015-06-28 ENCOUNTER — Ambulatory Visit (INDEPENDENT_AMBULATORY_CARE_PROVIDER_SITE_OTHER): Payer: Medicare Other | Admitting: *Deleted

## 2015-06-28 DIAGNOSIS — Z23 Encounter for immunization: Secondary | ICD-10-CM

## 2015-06-28 NOTE — Progress Notes (Signed)
Patient ID: Alex Frazier, male   DOB: Jan 11, 1946, 69 y.o.   MRN: [id number]  Patient seen in office for Influenza Vaccination.   Tolerated IM administration well.   Immunization history updated.

## 2015-07-09 ENCOUNTER — Other Ambulatory Visit: Payer: Self-pay | Admitting: Family Medicine

## 2015-07-30 ENCOUNTER — Ambulatory Visit (INDEPENDENT_AMBULATORY_CARE_PROVIDER_SITE_OTHER): Payer: Medicare Other | Admitting: Family Medicine

## 2015-07-30 ENCOUNTER — Encounter: Payer: Self-pay | Admitting: Family Medicine

## 2015-07-30 VITALS — BP 106/68 | HR 84 | Temp 98.1°F | Resp 18 | Wt 167.0 lb

## 2015-07-30 DIAGNOSIS — E785 Hyperlipidemia, unspecified: Secondary | ICD-10-CM

## 2015-07-30 DIAGNOSIS — Z1211 Encounter for screening for malignant neoplasm of colon: Secondary | ICD-10-CM

## 2015-07-30 LAB — COMPLETE METABOLIC PANEL WITH GFR
ALT: 12 U/L (ref 9–46)
AST: 22 U/L (ref 10–35)
Albumin: 3.7 g/dL (ref 3.6–5.1)
Alkaline Phosphatase: 100 U/L (ref 40–115)
BILIRUBIN TOTAL: 0.5 mg/dL (ref 0.2–1.2)
BUN: 17 mg/dL (ref 7–25)
CALCIUM: 9.1 mg/dL (ref 8.6–10.3)
CHLORIDE: 104 mmol/L (ref 98–110)
CO2: 26 mmol/L (ref 20–31)
Creat: 0.79 mg/dL (ref 0.70–1.25)
GFR, Est African American: >89 mL/min (ref >=60)
GFR, Est Non African American: >89 mL/min (ref >=60)
GLUCOSE: 85 mg/dL (ref 70–99)
Potassium: 4.2 mmol/L (ref 3.5–5.3)
Sodium: 138 mmol/L (ref 135–146)
Total Protein: 6 g/dL — ABNORMAL LOW (ref 6.1–8.1)

## 2015-07-30 LAB — LIPID PANEL
Cholesterol: 117 mg/dL — ABNORMAL LOW (ref 125–200)
HDL: 30 mg/dL — ABNORMAL LOW (ref >=40)
LDL CALC: 56 mg/dL (ref <130)
TRIGLYCERIDES: 154 mg/dL — AB (ref <150)
Total CHOL/HDL Ratio: 3.9 Ratio (ref <=5.0)
VLDL: 31 mg/dL — AB (ref <30)

## 2015-07-30 LAB — CBC WITH DIFFERENTIAL/PLATELET
Basophils Absolute: 0 10*3/uL (ref 0.0–0.1)
Basophils Relative: 0 % (ref 0–1)
EOS PCT: 3 % (ref 0–5)
Eosinophils Absolute: 0.1 10*3/uL (ref 0.0–0.7)
HCT: 41.1 % (ref 39.0–52.0)
Hemoglobin: 14.8 g/dL (ref 13.0–17.0)
LYMPHS PCT: 30 % (ref 12–46)
Lymphs Abs: 1.4 10*3/uL (ref 0.7–4.0)
MCH: 30.6 pg (ref 26.0–34.0)
MCHC: 36 g/dL (ref 30.0–36.0)
MCV: 84.9 fL (ref 78.0–100.0)
MPV: 9.1 fL (ref 8.6–12.4)
Monocytes Absolute: 0.5 10*3/uL (ref 0.1–1.0)
Monocytes Relative: 11 % (ref 3–12)
NEUTROS ABS: 2.6 10*3/uL (ref 1.7–7.7)
NEUTROS PCT: 56 % (ref 43–77)
PLATELETS: 207 10*3/uL (ref 150–400)
RBC: 4.84 MIL/uL (ref 4.22–5.81)
RDW: 13.6 % (ref 11.5–15.5)
WBC: 4.6 10*3/uL (ref 4.0–10.5)

## 2015-07-30 NOTE — Progress Notes (Signed)
Subjective:    Patient ID: Alex Frazier, male    DOB: Jun 03, 1946, 69 y.o.   MRN: [id number]  HPI  01/30/15 Patient is here today for followup of his hyperlipidemia. He is currently taking Pravachol 80 mg by mouth daily. He denies any myalgia or right upper quadrant pain. Overall he is doing well and his review of systems is completely negative. He denies any chest pain, shortness of breath, dyspnea on exertion. He denies any nausea vomiting or diarrhea. He continues to have problems with motion due to his movement disorder. Pneumovax, prevnar, and zostavax are up to date.  Colonoscopy was last in 2010.  Patient is due for a PSA/DRE.  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  At that time, my plan was: , Check a fasting lipid panel. His goal LDL is less than 130. The remainder of his immunizations and preventive care are up to date - COMPLETE METABOLIC PANEL WITH GFR - Lipid panel - CBC with Differential  I will also check a PSA. His prostate exam today is completely normal. The patient does have external skin tags from previous hemorrhoids. Other precancerous lesions on his left temple were treated with cryotherapy using liquid nitrogen for a total of 30 seconds each. If the lesions persist I would recommend a surgical biopsy. Also recommended that the patient start performing more physical exercise at home such as stand up/sit downs out of a chair using a walker for balance, biceps curls, tricep extensions, and overhead presses using low weights such as 5 pounds just to build up his stamina and improve his poor muscle strength to help with his balance and prevent falling  07/30/15 He is here for follow up.  Its been 5 years since his last colonoscopy.  I would like to give him stool cards to screen for colon cancer. His immunizations are  UTD.  Continues to have poor balance and generalized weakness.  He has been trying to use the stationary bike daily.    Past Medical History  Diagnosis Date  . Bulging discs   . Gastritis   . Parkinsonian syndrome   . Hyperlipidemia   . Peripheral neuropathy   . B12 deficiency     Borderline  . Neuropathy    Current Outpatient Prescriptions on File Prior to Visit  Medication Sig Dispense Refill  . aspirin 325 MG tablet Take 325 mg by mouth daily.      . carbidopa-levodopa (SINEMET IR) 25-100 MG per tablet Take 2 tablets by mouth 3 (three) times daily.     . DULoxetine (CYMBALTA) 60 MG capsule TAKE 1 BY MOUTH DAILY 90 capsule 3  . Fish Oil-Cholecalciferol (OMEGA-3 FISH OIL/VITAMIN D3) 1000-1000 MG-UNIT CAPS Take by mouth.    . fluticasone (FLONASE) 50 MCG/ACT nasal spray Place 2 sprays into both nostrils daily. 16 g 6  . Polyethylene Glycol 3350 (MIRALAX PO) Take by mouth.    . pramipexole (MIRAPEX) 0.125 MG tablet Take 0.125 mg by mouth 3 (three) times daily.    . pravastatin (PRAVACHOL) 80 MG tablet TAKE 1 BY MOUTH DAILY 90 tablet 3  . saw palmetto 500 MG capsule Take 500 mg by mouth daily.     No current facility-administered medications on file prior to visit.   Allergies  Allergen Reactions  . Indomethacin   . Lyrica [Pregabalin]  Social History   Social History  . Marital Status: Married    Spouse Name: N/A  . Number of Children: N/A  . Years of Education: N/A   Occupational History  . Not on file.   Social History Main Topics  . Smoking status: Never Smoker   . Smokeless tobacco: Not on file  . Alcohol Use: No  . Drug Use: No  . Sexual Activity: Not on file   Other Topics Concern  . Not on file   Social History Narrative     Review of Systems  All other systems reviewed and are negative.      Objective:   Physical Exam  Constitutional: He appears well-developed and well-nourished.  HENT:  Nose: Nose normal.  Mouth/Throat: Oropharynx is clear  and moist.  Eyes: Conjunctivae are normal. No scleral icterus.  Neck: Neck supple. No JVD present. No thyromegaly present.  Cardiovascular: Normal rate and regular rhythm.  Exam reveals no friction rub.   No murmur heard. Pulmonary/Chest: Effort normal and breath sounds normal. No respiratory distress. He has no wheezes. He has no rales.  Abdominal: Soft. Bowel sounds are normal. He exhibits no distension. There is no tenderness. There is no rebound and no guarding.  Musculoskeletal: He exhibits no edema.  Lymphadenopathy:    He has no cervical adenopathy.  Skin: Skin is warm. No rash noted. No erythema. No pallor.  Vitals reviewed.   Patient has bradykinesia. He has no resting tremor. Has a slow shuffling gait and walks with a four-pronged cane.    Assessment & Plan:  Screen for colon cancer - Plan: Fecal occult blood, imunochemical, Fecal occult blood, imunochemical, Fecal occult blood, imunochemical  HLD (hyperlipidemia) - Plan: CBC with Differential/Platelet, COMPLETE METABOLIC PANEL WITH GFR, Lipid panel  Check fasting lipid panel. Goal LDL cholesterol is less than 130. Check CMP to monitor for liver toxicity. I continue to try to encourage knee extensions and knee flexion exercises. I will also check fecal occult blood cards 3 to screen for colon cancer.

## 2015-08-01 ENCOUNTER — Encounter: Payer: Self-pay | Admitting: Family Medicine

## 2015-08-01 ENCOUNTER — Telehealth: Payer: Self-pay | Admitting: Family Medicine

## 2015-08-01 DIAGNOSIS — E785 Hyperlipidemia, unspecified: Secondary | ICD-10-CM

## 2015-08-01 MED ORDER — PRAVASTATIN SODIUM 20 MG PO TABS: 20.0000 mg | ORAL_TABLET | Freq: Every day | ORAL | Status: DC

## 2015-08-01 NOTE — Telephone Encounter (Signed)
-----   Message from Susy Frizzle, MD sent at 07/31/2015  7:05 AM EST ----- Labs are excellent.  I think his cholesterol is so good we can decrease pravastatin to 20 mg poqday and recheck in 6 months.

## 2015-08-01 NOTE — Telephone Encounter (Signed)
Wife aware of lab results and med change.

## 2015-08-10 ENCOUNTER — Encounter: Payer: Self-pay | Admitting: Family Medicine

## 2015-08-10 DIAGNOSIS — H25011 Cortical age-related cataract, right eye: Secondary | ICD-10-CM | POA: Diagnosis not present

## 2015-08-10 DIAGNOSIS — H524 Presbyopia: Secondary | ICD-10-CM | POA: Diagnosis not present

## 2015-08-10 DIAGNOSIS — H2513 Age-related nuclear cataract, bilateral: Secondary | ICD-10-CM | POA: Diagnosis not present

## 2015-08-10 DIAGNOSIS — D2312 Other benign neoplasm of skin of left eyelid, including canthus: Secondary | ICD-10-CM | POA: Diagnosis not present

## 2015-08-10 LAB — HM DIABETES EYE EXAM

## 2015-11-13 ENCOUNTER — Encounter: Payer: Self-pay | Admitting: *Deleted

## 2016-01-08 DIAGNOSIS — E139 Other specified diabetes mellitus without complications: Secondary | ICD-10-CM | POA: Diagnosis not present

## 2016-01-08 DIAGNOSIS — G20 Parkinson's disease: Secondary | ICD-10-CM | POA: Diagnosis not present

## 2016-01-08 DIAGNOSIS — G629 Polyneuropathy, unspecified: Secondary | ICD-10-CM | POA: Diagnosis not present

## 2016-01-08 DIAGNOSIS — Z7982 Long term (current) use of aspirin: Secondary | ICD-10-CM | POA: Diagnosis not present

## 2016-01-08 DIAGNOSIS — Z79899 Other long term (current) drug therapy: Secondary | ICD-10-CM | POA: Diagnosis not present

## 2016-01-31 ENCOUNTER — Ambulatory Visit (INDEPENDENT_AMBULATORY_CARE_PROVIDER_SITE_OTHER): Payer: Medicare Other | Admitting: Family Medicine

## 2016-01-31 ENCOUNTER — Encounter: Payer: Self-pay | Admitting: Family Medicine

## 2016-01-31 VITALS — BP 110/70 | HR 72 | Temp 98.1°F | Resp 18 | Wt 168.0 lb

## 2016-01-31 DIAGNOSIS — E785 Hyperlipidemia, unspecified: Secondary | ICD-10-CM | POA: Diagnosis not present

## 2016-01-31 DIAGNOSIS — Z125 Encounter for screening for malignant neoplasm of prostate: Secondary | ICD-10-CM | POA: Diagnosis not present

## 2016-01-31 LAB — LIPID PANEL
CHOL/HDL RATIO: 3.6 ratio (ref <=5.0)
Cholesterol: 160 mg/dL (ref 125–200)
HDL: 45 mg/dL (ref >=40)
LDL Cholesterol: 92 mg/dL (ref <130)
Triglycerides: 115 mg/dL (ref <150)
VLDL: 23 mg/dL (ref <30)

## 2016-01-31 NOTE — Progress Notes (Signed)
Subjective:    Patient ID: Alex Frazier, male    DOB: 08-19-1946, 70 y.o.   MRN: [id number]  HPI  01/30/15 Patient is here today for followup of his hyperlipidemia. He is currently taking Pravachol 80 mg by mouth daily. He denies any myalgia or right upper quadrant pain. Overall he is doing well and his review of systems is completely negative. He denies any chest pain, shortness of breath, dyspnea on exertion. He denies any nausea vomiting or diarrhea. He continues to have problems with motion due to his movement disorder. Pneumovax, prevnar, and zostavax are up to date.  Colonoscopy was last in 2010.  Patient is due for a PSA/DRE.  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  At that time, my plan was: , Check a fasting lipid panel. His goal LDL is less than 130. The remainder of his immunizations and preventive care are up to date - COMPLETE METABOLIC PANEL WITH GFR - Lipid panel - CBC with Differential  I will also check a PSA. His prostate exam today is completely normal. The patient does have external skin tags from previous hemorrhoids. Other precancerous lesions on his left temple were treated with cryotherapy using liquid nitrogen for a total of 30 seconds each. If the lesions persist I would recommend a surgical biopsy. Also recommended that the patient start performing more physical exercise at home such as stand up/sit downs out of a chair using a walker for balance, biceps curls, tricep extensions, and overhead presses using low weights such as 5 pounds just to build up his stamina and improve his poor muscle strength to help with his balance and prevent falling  07/30/15 He is here for follow up.  Its been 5 years since his last colonoscopy.  I would like to give him stool cards to screen for colon cancer. His immunizations are  UTD.  Continues to have poor balance and generalized weakness.  He has been trying to use the stationary bike daily.  At that time, my plan was: Check fasting lipid panel. Goal LDL cholesterol is less than 130. Check CMP to monitor for liver toxicity. I continue to try to encourage knee extensions and knee flexion exercises. I will also check fecal occult blood cards 3 to screen for colon cancer.  01/31/16 Patient is here today for follow-up. At his last visit, his LDL cholesterol was found to be well below 100. His HDL cholesterol was low at 30. Therefore I decreased his pravastatin to 20 mg a day and he is here today to recheck that. Is also been 1 year since his last PSA and he is due to recheck that today. Otherwise he is doing fairly well. He is also still battling Parkinson's disease with bradykinesia and difficulty moving and weakness. He also reports more neuropathy in his feet at night. His neurologist recently switched to Cymbalta to nighttime which seems to help and also gave him a cream for neuropathic pain which seems to help.  Past Medical History  Diagnosis Date  . Bulging discs   . Gastritis   . Parkinsonian syndrome ([place])   . Hyperlipidemia   . Peripheral neuropathy ([place])   . B12 deficiency     Borderline  . Neuropathy [facility])    Current Outpatient Prescriptions on File Prior to Visit  Medication Sig Dispense Refill  .  aspirin 325 MG tablet Take 325 mg by mouth daily.      . Carbidopa-Levodopa ER (SINEMET CR) 25-100 MG tablet controlled release Take 1 tablet by mouth at bedtime.  2  . DULoxetine (CYMBALTA) 60 MG capsule TAKE 1 BY MOUTH DAILY 90 capsule 3  . Fish Oil-Cholecalciferol (OMEGA-3 FISH OIL/VITAMIN D3) 1000-1000 MG-UNIT CAPS Take by mouth.    . fluticasone (FLONASE) 50 MCG/ACT nasal spray Place 2 sprays into both nostrils daily. 16 g 6  . Polyethylene Glycol 3350 (MIRALAX PO) Take by mouth.    . pramipexole (MIRAPEX) 0.125 MG tablet Take 0.125 mg by mouth 3 (three)  times daily.    . pravastatin (PRAVACHOL) 20 MG tablet Take 1 tablet (20 mg total) by mouth daily at 6 PM. 90 tablet 3  . saw palmetto 500 MG capsule Take 500 mg by mouth daily.     No current facility-administered medications on file prior to visit.   Allergies  Allergen Reactions  . Indomethacin   . Lyrica [Pregabalin]    Social History   Social History  . Marital Status: Married    Spouse Name: N/A  . Number of Children: N/A  . Years of Education: N/A   Occupational History  . Not on file.   Social History Main Topics  . Smoking status: Never Smoker   . Smokeless tobacco: Not on file  . Alcohol Use: No  . Drug Use: No  . Sexual Activity: Not on file   Other Topics Concern  . Not on file   Social History Narrative     Review of Systems  All other systems reviewed and are negative.      Objective:   Physical Exam  Constitutional: He appears well-developed and well-nourished.  HENT:  Nose: Nose normal.  Mouth/Throat: Oropharynx is clear and moist.  Eyes: Conjunctivae are normal. No scleral icterus.  Neck: Neck supple. No JVD present. No thyromegaly present.  Cardiovascular: Normal rate and regular rhythm.  Exam reveals no friction rub.   No murmur heard. Pulmonary/Chest: Effort normal and breath sounds normal. No respiratory distress. He has no wheezes. He has no rales.  Abdominal: Soft. Bowel sounds are normal. He exhibits no distension. There is no tenderness. There is no rebound and no guarding.  Musculoskeletal: He exhibits no edema.  Lymphadenopathy:    He has no cervical adenopathy.  Skin: Skin is warm. No rash noted. No erythema. No pallor.  Vitals reviewed.   Patient has bradykinesia. He has no resting tremor. Has a slow shuffling gait and walks with a four-pronged cane.    Assessment & Plan:  HLD (hyperlipidemia) - Plan: Lipid panel  Prostate cancer screening - Plan: PSA  I will check a PSA level drawn lab work. I will also check a fasting  lipid panel. If his LDL cholesterol is still less than 100, I will likely discontinue pravastatin as I believe that the side effect profile outweighs the benefit he is getting with such excellent cholesterol.

## 2016-02-01 ENCOUNTER — Encounter: Payer: Self-pay | Admitting: Family Medicine

## 2016-02-01 LAB — PSA: PSA: 1.06 ng/mL (ref <=4.00)

## 2016-02-21 DIAGNOSIS — G629 Polyneuropathy, unspecified: Secondary | ICD-10-CM | POA: Diagnosis not present

## 2016-02-21 DIAGNOSIS — Z79899 Other long term (current) drug therapy: Secondary | ICD-10-CM | POA: Diagnosis not present

## 2016-02-21 DIAGNOSIS — G20 Parkinson's disease: Secondary | ICD-10-CM | POA: Diagnosis not present

## 2016-02-21 DIAGNOSIS — E7800 Pure hypercholesterolemia, unspecified: Secondary | ICD-10-CM | POA: Diagnosis not present

## 2016-02-28 ENCOUNTER — Telehealth: Payer: Self-pay | Admitting: Family Medicine

## 2016-02-28 DIAGNOSIS — R269 Unspecified abnormalities of gait and mobility: Secondary | ICD-10-CM

## 2016-02-28 DIAGNOSIS — Z9181 History of falling: Secondary | ICD-10-CM

## 2016-02-28 NOTE — Telephone Encounter (Signed)
Patient wife calling to say that Greysen needs therapy to help open up his gate for his walking, please call his wife regarding this so she can explain more in detail  [phone]

## 2016-02-29 NOTE — Telephone Encounter (Signed)
Referral place for PT for abnormal gait and fall prevention d/t foot drop and newly acquired foot brace

## 2016-02-29 NOTE — Telephone Encounter (Signed)
[facility]

## 2016-03-07 ENCOUNTER — Ambulatory Visit: Payer: Medicare Other | Attending: Family Medicine | Admitting: Physical Therapy

## 2016-03-07 DIAGNOSIS — M6281 Muscle weakness (generalized): Secondary | ICD-10-CM | POA: Insufficient documentation

## 2016-03-07 DIAGNOSIS — R29818 Other symptoms and signs involving the nervous system: Secondary | ICD-10-CM | POA: Insufficient documentation

## 2016-03-07 DIAGNOSIS — R2689 Other abnormalities of gait and mobility: Secondary | ICD-10-CM | POA: Insufficient documentation

## 2016-03-07 DIAGNOSIS — R2681 Unsteadiness on feet: Secondary | ICD-10-CM | POA: Diagnosis not present

## 2016-03-07 NOTE — Therapy (Signed)
[facility] [address] [place], [place], [address] Phone: [phone]   Fax:  [phone]  Physical Therapy Evaluation  Patient Details  Name: Alex Frazier MRN: [id number] Date of Birth: 06/05/1946 Referring Provider: Dennard Schaumann  Encounter Date: 03/07/2016      PT End of Session - 03/07/16 1151    Visit Number 1   Number of Visits 9   Date for PT Re-Evaluation 05/06/16   Authorization Type Medicare/BCBS-GCODE every 10th visit   PT Start Time 0936   PT Stop Time 1019   PT Time Calculation (min) 43 min   Equipment Utilized During Treatment Gait belt   Activity Tolerance Patient tolerated treatment well   Behavior During Therapy [facility] for tasks assessed/performed  Pt reports he does not want to come back to therapy      Past Medical History  Diagnosis Date  . Bulging discs   . Gastritis   . Parkinsonian syndrome ([place])   . Hyperlipidemia   . Peripheral neuropathy ([place])   . B12 deficiency     Borderline  . Neuropathy [facility])     Past Surgical History  Procedure Laterality Date  . Appendectomy    . Back surgery      There were no vitals filed for this visit.       Subjective Assessment - 03/07/16 0942    Subjective Pt is a 70 year old male who was diagnosed with Parkinson's 2-3 years ago.  He has noticed foot drag on L, with worsening balance over the past 1-2 years.  He is to get an AFO for L LE.  Pt uses 4-wheeled rolling walker; in home, he uses a cane.  Pt has had 3 falls within 4 days approximately one month ago.  He has had at least 12 falls in the past 6 months.   Wife reprots difficulty getting in and out of recliner.   Patient is accompained by: Family member  wife, Alex Frazier   Pertinent History Parkinson's disease x 2-3 years   Patient Stated Goals Pt's goal is to improve walking and decrease falls.   Currently in Pain? Yes   Pain Score 8    Pain Location Foot  legs   Pain Orientation Right;Left    Pain Descriptors / Indicators Burning  Stinging   Pain Type Chronic pain   Pain Onset More than a month ago   Pain Frequency Constant   Aggravating Factors  unsure   Pain Relieving Factors pain medications alleviate   Multiple Pain Sites Yes   Pain Score 4   Pain Location Neck   Pain Orientation Right;Left   Pain Descriptors / Indicators Aching   Pain Type Acute pain   Pain Onset 1 to 4 weeks ago  after series of 3 falls in early June   Pain Frequency Constant   Aggravating Factors  Turning head   Pain Relieving Factors pillow, positioning            [facility] PT Assessment - 03/07/16 0955    Assessment   Medical Diagnosis Parkinson's disease   Referring Provider [place]   Onset Date/Surgical Date --  within past 6 months-worsening gait   Precautions   Precautions Fall   Balance Screen   Has the patient fallen in the past 6 months Yes   How many times? 12   Has the patient had a decrease in activity level because of a fear of falling?  Yes   Is the patient reluctant  to leave their home because of a fear of falling?  Yes   [place] Private residence   Living Arrangements Spouse/significant other   Available Help at Discharge Family   Type of [place] to enter   Entrance Stairs-Number of Steps 4   Entrance Stairs-Rails Right   [place] One level;Laundry or work area in basement;Able to live on main level with Programmer, applications - 4 wheels;Cane - single point;Shower seat;Grab bars - tub/shower;Walker - 2 wheels   Prior Function   Level of Independence Independent with basic ADLs;Independent with household mobility without device  sitting to bathe and do ADLs   Vocation Retired   Leisure Has foot pedaler bike, which he uses 30 minutes per day   ROM / Strength   AROM / PROM / Strength Strength;PROM   PROM   Overall PROM  Deficits   Overall PROM Comments rigidity noted/increased tone noted  bilateral LEs   Strength   Overall Strength Deficits   Overall Strength Comments Grossly tested RLE 4/5, LLE 3+/5 hip flexors, quads, hamstrings; bilateral ankle dorsiflexion 3-/5   Transfers   Transfers Sit to Stand;Stand to Sit   Sit to Stand 6: Modified independent (Device/Increase time);With upper extremity assist;From chair/3-in-1   Five time sit to stand comments  unable to perform without UE support   Stand to Sit 6: Modified independent (Device/Increase time);With upper extremity assist;To chair/3-in-1   Ambulation/Gait   Ambulation/Gait Yes   Ambulation/Gait Assistance 5: Supervision;4: Min guard   Ambulation Distance (Feet) 80 Feet   Assistive device Rollator   Gait Pattern Step-through pattern;Decreased step length - right;Decreased step length - left;Decreased dorsiflexion - right;Decreased dorsiflexion - left;Scissoring;Trunk flexed;Narrow base of support;Poor foot clearance - left;Poor foot clearance - right  foot drag on L multiple times with gait   Ambulation Surface Level;Indoor   Gait velocity 12.35 sec= 2.66 ft/sec   Gait Comments 2 episodes of L foot dragging with gait during eval.   Standardized Balance Assessment   Standardized Balance Assessment Timed Up and Go Test   Timed Up and Go Test   Normal TUG (seconds) 19.61   Cognitive TUG (seconds) 21.81   High Level Balance   High Level Balance Comments unable to stand without UE supprot-increased sway         Upon entering treatment gym from lobby, pt noted to experience near freezing-episode, where walker gets too far in front and feet do not move forward.  Wife reports this is new today; different from foot drag.  Pt has already seen Ronalee Belts with [employer] for AFO for L foot drag and is awaiting AFO.                        PT Long Term Goals - 03/07/16 1157    PT LONG TERM GOAL #1   Title Pt will verbalize understanding of HEP for improved balance, transfers, gait for improved functional  mobility.  TARGET 04/06/16   Time 4   Period Weeks   Status New   PT LONG TERM GOAL #2   Title Pt will perform at least 6 of 10 reps of sit<>stand transfers with minimal UE support, for improved transfer efficiency and safety.   Time 4   Period Weeks   Status New   PT LONG TERM GOAL #3   Title Pt will improve TUG score to less than or equal to  16 seconds for decreased fall risk.   Time 4   Period Weeks   Status New   PT LONG TERM GOAL #4   Title Pt will perform at least 3 minutes of standing with intermittent UE support with supervision, for improved standing balance and performance of ADLs.   Time 4   Period Weeks   Status New   PT LONG TERM GOAL #5   Title Pt will verbalize understanding of fall prevention in home environment.   Time 4   Period Weeks   Status New               Plan - 12-Mar-2016 1153    Clinical Impression Statement Pt is a 70 year old male with history of Parkinsonism and neuropathy who presents to OP PT with decreased balance, decreased timing and coordination of gait, decreased strength, bradykinesia, rigidity, dyskinesias.  Pt has history of 12 falls in the past 6 months; pt at high risk for falls per TUG and TUG cognitive; Berg unable to be fully completed due to pt not able to stand unsupported without therapist support.  Pt would benefit from skilled PT to address the above stated deficits to decrease fall risk and improve fucntional mobiltiy.   Rehab Potential Good   PT Frequency 2x / week   PT Duration 4 weeks  plus eval   PT Treatment/Interventions ADLs/Self Care Home Management;Therapeutic exercise;Therapeutic activities;Functional mobility training;Gait training;Neuromuscular re-education;Balance training;Patient/family education   PT Next Visit Plan Fall prevention, initiate HEP for sit<>stand transfers, turning, gait activities (once AFO has arrived)   Consulted and Agree with Plan of Care Patient;Family member/caregiver   Family Member  Consulted wife-[name]      Patient will benefit from skilled therapeutic intervention in order to improve the following deficits and impairments:  Abnormal gait, Decreased balance, Decreased mobility, Decreased range of motion, Decreased strength, Difficulty walking, Postural dysfunction, Impaired tone  Visit Diagnosis: Other abnormalities of gait and mobility  Unsteadiness on feet  Muscle weakness (generalized)  Other symptoms and signs involving the nervous system      G-Codes - 2016/03/12 1200    Functional Assessment Tool Used TUG 19.61 sec, TUG cog 21.81 sec, unable to stand without UE supported; 12 falls in 6 months   Mobility: Walking and Moving Around Current Status [id number]) At least 40 percent but less than 60 percent impaired, limited or restricted   Mobility: Walking and Moving Around Goal Status [phone]) At least 20 percent but less than 40 percent impaired, limited or restricted       Problem List Patient Active Problem List   Diagnosis Date Noted  . Neuropathy ([place])   . Bulging discs   . Gastritis   . Parkinsonian syndrome ([place])   . Hyperlipidemia     [place] W. March 12, 2016, 12:01 PM [facility] [address] [place], [place], [address] Phone: [phone]   Fax:  [phone]  Name: Alex Frazier MRN: [id number] Date of Birth: Mar 25, 1946

## 2016-03-14 ENCOUNTER — Encounter: Payer: Self-pay | Admitting: Family Medicine

## 2016-03-14 ENCOUNTER — Ambulatory Visit (INDEPENDENT_AMBULATORY_CARE_PROVIDER_SITE_OTHER): Payer: Medicare Other | Admitting: Family Medicine

## 2016-03-14 ENCOUNTER — Ambulatory Visit
Admission: RE | Admit: 2016-03-14 | Discharge: 2016-03-14 | Disposition: A | Discharge status: Home / Self Care | Payer: Medicare Other | Source: Ambulatory Visit | Attending: Family Medicine | Admitting: Family Medicine

## 2016-03-14 VITALS — BP 108/64 | HR 92 | Temp 98.3°F | Resp 16 | Wt 168.0 lb

## 2016-03-14 DIAGNOSIS — M542 Cervicalgia: Secondary | ICD-10-CM | POA: Diagnosis not present

## 2016-03-14 DIAGNOSIS — M47812 Spondylosis without myelopathy or radiculopathy, cervical region: Secondary | ICD-10-CM | POA: Diagnosis not present

## 2016-03-14 MED ORDER — PREDNISONE 20 MG PO TABS: ORAL_TABLET | ORAL | Status: DC

## 2016-03-14 MED ORDER — DIAZEPAM 5 MG PO TABS: 5.0000 mg | ORAL_TABLET | Freq: Three times a day (TID) | ORAL | Status: DC | PRN

## 2016-03-14 NOTE — Progress Notes (Signed)
Subjective:    Patient ID: Alex Frazier, male    DOB: June 06, 1946, 70 y.o.   MRN: [id number]  HPI  Patient has fallen 3 or 4 times in the last month. Since that time he has had pain in his neck around the level of C3. The pain is located on the lateral aspects bilaterally. He has no tenderness to palpation over the spinous processes. There is no extreme tenderness to palpation over the transverse process. It is more of a muscular soreness. However he does have pain with rotation of the neck in forward flexion of the neck. He denies any neuropathy in his arms or weakness in his arms. Past Medical History  Diagnosis Date  . Bulging discs   . Gastritis   . Parkinsonian syndrome ([place])   . Hyperlipidemia   . Peripheral neuropathy ([place])   . B12 deficiency     Borderline  . Neuropathy [facility])    Past Surgical History  Procedure Laterality Date  . Appendectomy    . Back surgery     Current Outpatient Prescriptions on File Prior to Visit  Medication Sig Dispense Refill  . aspirin 325 MG tablet Take 325 mg by mouth daily.      . carbidopa-levodopa (SINEMET IR) 25-100 MG tablet 1 tablet 3 (three) times daily.     . Carbidopa-Levodopa ER (SINEMET CR) 25-100 MG tablet controlled release Take 1 tablet by mouth at bedtime.  2  . DULoxetine (CYMBALTA) 60 MG capsule TAKE 1 BY MOUTH DAILY 90 capsule 3  . Fish Oil-Cholecalciferol (OMEGA-3 FISH OIL/VITAMIN D3) 1000-1000 MG-UNIT CAPS Take by mouth.    . fluticasone (FLONASE) 50 MCG/ACT nasal spray Place 2 sprays into both nostrils daily. 16 g 6  . Polyethylene Glycol 3350 (MIRALAX PO) Take by mouth.    . pramipexole (MIRAPEX) 0.125 MG tablet Take 0.125 mg by mouth 3 (three) times daily.    . pravastatin (PRAVACHOL) 20 MG tablet Take 1 tablet (20 mg total) by mouth daily at 6 PM. 90 tablet 3  . saw palmetto 500 MG capsule Take 500 mg by mouth daily.     No current facility-administered medications on file prior to visit.   Allergies  Allergen  Reactions  . Indomethacin   . Lyrica [Pregabalin]    Social History   Social History  . Marital Status: Married    Spouse Name: N/A  . Number of Children: N/A  . Years of Education: N/A   Occupational History  . Not on file.   Social History Main Topics  . Smoking status: Never Smoker   . Smokeless tobacco: Not on file  . Alcohol Use: No  . Drug Use: No  . Sexual Activity: Not on file   Other Topics Concern  . Not on file   Social History Narrative      Review of Systems  All other systems reviewed and are negative.      Objective:   Physical Exam  Cardiovascular: Normal rate, regular rhythm and normal heart sounds.   Pulmonary/Chest: Effort normal and breath sounds normal.  Musculoskeletal:       Cervical back: He exhibits decreased range of motion, tenderness, pain and spasm. He exhibits no bony tenderness.  Vitals reviewed.         Assessment & Plan:  Neck pain - Plan: diazepam (VALIUM) 5 MG tablet, DG Cervical Spine Complete, predniSONE (DELTASONE) 20 MG tablet, DISCONTINUED: predniSONE (DELTASONE) 20 MG tablet I believe the patient has cervical paraspinal  muscle spasms. However I would like to obtain an x-ray of his neck to rule out an occult fracture which I believe is highly unlikely. If x-ray is normal, proceed with a prednisone taper pack for inflammation and diazepam 5 mg every 8 hours as needed for muscle spasms. If not improving by next week, I would recommend physical therapy consultation

## 2016-03-19 ENCOUNTER — Ambulatory Visit: Payer: Medicare Other | Attending: Family Medicine | Admitting: Physical Therapy

## 2016-03-19 DIAGNOSIS — R2681 Unsteadiness on feet: Secondary | ICD-10-CM

## 2016-03-19 DIAGNOSIS — M6281 Muscle weakness (generalized): Secondary | ICD-10-CM | POA: Diagnosis not present

## 2016-03-19 DIAGNOSIS — R29818 Other symptoms and signs involving the nervous system: Secondary | ICD-10-CM | POA: Diagnosis not present

## 2016-03-19 DIAGNOSIS — R2689 Other abnormalities of gait and mobility: Secondary | ICD-10-CM

## 2016-03-19 NOTE — Therapy (Signed)
[facility] [address] [name], [place], [address] Phone: [phone]   Fax:  [phone]  Physical Therapy Treatment  Patient Details  Name: Alex Frazier MRN: [id number] Date of Birth: 01/02/1946 Referring Provider: Dennard Schaumann  Encounter Date: 03/19/2016      PT End of Session - 03/19/16 1231    Visit Number 2   Number of Visits 9   Date for PT Re-Evaluation 05/06/16   Authorization Type Medicare/BCBS-GCODE every 10th visit   PT Start Time 1020   PT Stop Time 1107   PT Time Calculation (min) 47 min   Equipment Utilized During Treatment Gait belt   Activity Tolerance Patient tolerated treatment well   Behavior During Therapy [facility] for tasks assessed/performed  Pt reports he does not want to come back to therapy      Past Medical History  Diagnosis Date  . Bulging discs   . Gastritis   . Parkinsonian syndrome ([place])   . Hyperlipidemia   . Peripheral neuropathy ([place])   . B12 deficiency     Borderline  . Neuropathy [facility])     Past Surgical History  Procedure Laterality Date  . Appendectomy    . Back surgery      There were no vitals filed for this visit.      Subjective Assessment - 03/19/16 1024    Subjective Pt denies falls since last visit.  Went to MD about his neck pain and is on short term predisone.     Patient is accompained by: Family member  wife,[name]   Pertinent History Parkinson's disease x 2-3 years   Patient Stated Goals Pt's goal is to improve walking and decrease falls.   Currently in Pain? Yes   Pain Score 3    Pain Location Neck   Pain Orientation Right;Left   Pain Descriptors / Indicators Aching   Pain Type Acute pain   Pain Onset 1 to 4 weeks ago   Aggravating Factors  unsure   Pain Relieving Factors meds      Pt wearing AFO on L from [employer].  Reports picking it up since last visit but has not been wearing due to pressure/pain on lateral heel.  Examined pts skin.  No  areas of redness noted but pt had only put brace on just prior to coming to PT today.  Pt states "I haven't been wearing it.  I almost put it in the closet."  AFO causing pressure from upright at lateral heel.  Called Ross and [employer] to discuss discomfort.  Ross able to see pt immediatey after his PT appointment today.  Harrington Challenger states he has a similar brace that he wanted to try with patient and may switch the AFO out.  Gait with Rollator x 110' x 3 and 220' x 1 with L AFO.  Pt needing cues for safety, bil foot clearance, heel strike, appropriate step length, staying close to Rollator, and brake use.  Pt tends to internally rotate R foot as well and with narrow BOS.  Pt able to improve gait with cues but needs min assist for balance at times.  Leans posteriorly with static standing and at times with decreased knee extension bilaterally.  Sit<>stand transfers working on forward weight shift/nose over toes, hand placement, foot placement and scooting forward to edge of mat.  Pt able to perform without posterior lean with constant cues but quickly reverts back.  Pt with decreased control of descent and wife reports pt does this  at home as well.  Pt unable to stand statically without UE support.  Educated pt on importance of safety and using appropriate assistive device, not wearing slippers to decrease shuffling in home environment and importance of overall mobility in regards to PD.  Pt slow to respond to questions and frequently looks to his wife to answer for him.  See education section for details.           PT Education - 03/19/16 1228    Education provided Yes   Education Details transfer training, using appropriate assistive device to prevent falls, follow up with [employer] on AFO discomfort, safety with Rollator, trying various assistive devices to determine safest for home use, need to increase endurance/mobility outside of therapy safely.   Person(s) Educated Patient;Spouse   Methods  Explanation;Demonstration;Verbal cues   Comprehension Verbalized understanding;Need further instruction             PT Long Term Goals - 03/07/16 1157    PT LONG TERM GOAL #1   Title Pt will verbalize understanding of HEP for improved balance, transfers, gait for improved functional mobility.  TARGET 04/06/16   Time 4   Period Weeks   Status New   PT LONG TERM GOAL #2   Title Pt will perform at least 6 of 10 reps of sit<>stand transfers with minimal UE support, for improved transfer efficiency and safety.   Time 4   Period Weeks   Status New   PT LONG TERM GOAL #3   Title Pt will improve TUG score to less than or equal to 16 seconds for decreased fall risk.   Time 4   Period Weeks   Status New   PT LONG TERM GOAL #4   Title Pt will perform at least 3 minutes of standing with intermittent UE support with supervision, for improved standing balance and performance of ADLs.   Time 4   Period Weeks   Status New   PT LONG TERM GOAL #5   Title Pt will verbalize understanding of fall prevention in home environment.   Time 4   Period Weeks   Status New               Plan - 03/19/16 1231    Clinical Impression Statement Pt appears very hesitant to PT and wifes recommendations concerning mobility and safety.  Continues to be at risk for falls and needs cues and assist for safety.  Continue PT per POC.   Rehab Potential Good   PT Frequency 2x / week   PT Duration 4 weeks  plus eval   PT Treatment/Interventions ADLs/Self Care Home Management;Therapeutic exercise;Therapeutic activities;Functional mobility training;Gait training;Neuromuscular re-education;Balance training;Patient/family education   PT Next Visit Plan Follow up on AFO adjustment/switching out by [employer] Harrington Challenger) on 03/19/16.  Continue fall prevention education and safety with most appropriate assistive device.  Try RW vs clinic Rollator (per wife request as she may be purchasing one to leaving in car and one to use  in house) to determine most appropriate device., initiate HEP for sit<>stand transfers.   Consulted and Agree with Plan of Care Patient;Family member/caregiver   Family Member Consulted wife-[name]      Patient will benefit from skilled therapeutic intervention in order to improve the following deficits and impairments:  Abnormal gait, Decreased balance, Decreased mobility, Decreased range of motion, Decreased strength, Difficulty walking, Postural dysfunction, Impaired tone  Visit Diagnosis: Other abnormalities of gait and mobility  Unsteadiness on feet  Muscle weakness (generalized)  Other symptoms and signs involving the nervous system     Problem List Patient Active Problem List   Diagnosis Date Noted  . Neuropathy ([place])   . Bulging discs   . Gastritis   . Parkinsonian syndrome ([place])   . Hyperlipidemia     Narda Bonds 03/19/2016, 12:37 PM  [facility] [address] [place], [place], [address] Phone: [phone]   Fax:  [phone]  Name: CAISEN MUNDIE MRN: [id number] Date of Birth: 1945/10/10    Narda Bonds, [place] 03/19/2016 12:37 PM Phone: [phone] Fax: [phone]

## 2016-03-19 NOTE — Patient Instructions (Addendum)
1 

## 2016-03-25 ENCOUNTER — Ambulatory Visit: Payer: Medicare Other | Admitting: Physical Therapy

## 2016-03-25 DIAGNOSIS — R2681 Unsteadiness on feet: Secondary | ICD-10-CM

## 2016-03-25 DIAGNOSIS — R2689 Other abnormalities of gait and mobility: Secondary | ICD-10-CM

## 2016-03-25 DIAGNOSIS — R29818 Other symptoms and signs involving the nervous system: Secondary | ICD-10-CM

## 2016-03-25 DIAGNOSIS — M6281 Muscle weakness (generalized): Secondary | ICD-10-CM

## 2016-03-25 NOTE — Patient Instructions (Signed)
Functional Quadriceps: Sit to Stand    Sit on edge of chair, feet flat on floor. Lean forward getting nose over your toes.  While staying forward stand upright, extending knees fully. Repeat 10 times per set. Do 5 sessions per day.  http://orth.exer.us/734   Copyright  VHI. All rights reserved.   EXTENSION: Sitting - Resistance Band (Active)    Sit with feet flat. Against yellow resistance band, straighten right knee. Complete 2 sets of 10 repetitions. Repeat on left leg.  Perform 3 sessions per day.  Copyright  VHI. All rights reserved.   FLEXION: Sitting - Resistance Band (Active)    Sit, both feet flat. Against yellow resistance band, lift right knee toward ceiling. Complete 2 sets of 10 repetitions. Repeat on left leg.  Perform 3 sessions per day.  http://gtsc.exer.us/20   Copyright  VHI. All rights reserved.

## 2016-03-25 NOTE — Therapy (Signed)
[facility] [address] [place], [place], [address] Phone: [phone]   Fax:  [phone]  Physical Therapy Treatment  Patient Details  Name: Alex Frazier MRN: [id number] Date of Birth: Dec 03, 1945 Referring Provider: Dennard Schaumann  Encounter Date: 03/25/2016      PT End of Session - 03/25/16 1433    Visit Number 3   Number of Visits 9   Date for PT Re-Evaluation 05/06/16   Authorization Type Medicare/BCBS-GCODE every 10th visit   PT Start Time 1319   PT Stop Time 1410   PT Time Calculation (min) 51 min   Equipment Utilized During Treatment Gait belt   Activity Tolerance Patient tolerated treatment well   Behavior During Therapy [facility] for tasks assessed/performed  Pt reports he does not want to come back to therapy      Past Medical History  Diagnosis Date  . Bulging discs   . Gastritis   . Parkinsonian syndrome ([place])   . Hyperlipidemia   . Peripheral neuropathy ([place])   . B12 deficiency     Borderline  . Neuropathy [facility])     Past Surgical History  Procedure Laterality Date  . Appendectomy    . Back surgery      There were no vitals filed for this visit.          [facility] Adult PT Treatment/Exercise - 03/25/16 0001    Transfers   Transfers Sit to Stand;Stand to Sit   Sit to Stand 4: Min assist;4: Min guard;5: Supervision   Stand to Sit 5: Supervision;4: Min guard;4: Min assist   Number of Reps 10 reps;2 sets   Comments needed constant cues to maintain forward weight shift as well as prevent posterior lean against back of mat; quickly reverts back to posterior lean;also used blue therapy ball for forward weight shift and lean to lift bottom off floor x 8 reps with min assist   Ambulation/Gait   Ambulation/Gait Yes   Ambulation/Gait Assistance 5: Supervision;4: Min guard   Ambulation Distance (Feet) 220 Feet  x 1 with RW and 330' x 1 with Rollator   Assistive device Rollator;Rolling walker;Other  (Comment)  L AFO   Gait Pattern Step-through pattern;Decreased step length - right;Decreased step length - left;Decreased dorsiflexion - right;Decreased dorsiflexion - left;Scissoring;Trunk flexed;Narrow base of support;Poor foot clearance - left;Poor foot clearance - right   Ambulation Surface Level;Indoor   Gait Comments needs frequent cues to maintain proper distance to walker;used cue to "kick" under seat of rollator to prevent rollator from advancing too far forward;heavy reliance on bil UE weight bearing with both devices and able to decrease with cues   Self-Care   Self-Care Other Self-Care Comments   Other Self-Care Comments  Pt and wife continue to report decreased motivation for increased mobility as well as decreased socialization out in community due to impaired mobility.  Pt tearful during session stating that he doesnt go out as much any more because he is embarassed.  Pt reports spending a significant amount of time in his recliner at home.  Pt states he does not feel like therapy can help him and that he will continue to decline like his father did with his parkinsons (pt again tearful during this discussion).  Reiterated importance of increased mobility around the house along with HEP to improve strength and mobility and encouraged pt to continue to try PT.  Pt and wife agree at this time.  Pt continues to c/o bil foot pain/numbness from neuropathy.  Wife asking about over the counter gel inserts as pt had a callous on L heel that now has decreased and she is wondering if his sensation has increased now that callous gone.  Encouraged to try gel inserts.   Knee/Hip Exercises: Seated   Long Arc Quad Both;2 sets;10 reps;Other (comment)  yellow theraband   Marching Both;2 sets;10 reps;Other (comment)  yellow theraband                PT Education - 03/25/16 1431    Education provided Yes   Education Details transfer training, HEP, decreased weight bearing thru UE's with  assistive devices, importance of mobility around the home and HEP as part of strengthening program    Person(s) Educated Patient;Spouse   Methods Explanation;Demonstration;Handout;Verbal cues   Comprehension Verbalized understanding;Other (comment)  needs reinforcement             PT Long Term Goals - 03/07/16 1157    PT LONG TERM GOAL #1   Title Pt will verbalize understanding of HEP for improved balance, transfers, gait for improved functional mobility.  TARGET 04/06/16   Time 4   Period Weeks   Status New   PT LONG TERM GOAL #2   Title Pt will perform at least 6 of 10 reps of sit<>stand transfers with minimal UE support, for improved transfer efficiency and safety.   Time 4   Period Weeks   Status New   PT LONG TERM GOAL #3   Title Pt will improve TUG score to less than or equal to 16 seconds for decreased fall risk.   Time 4   Period Weeks   Status New   PT LONG TERM GOAL #4   Title Pt will perform at least 3 minutes of standing with intermittent UE support with supervision, for improved standing balance and performance of ADLs.   Time 4   Period Weeks   Status New   PT LONG TERM GOAL #5   Title Pt will verbalize understanding of fall prevention in home environment.   Time 4   Period Weeks   Status New               Plan - 03/25/16 1433    Clinical Impression Statement Pt tearful during session and continues to be somewhat reistant to therapy and increased mobility.  Continues with decreased overall strength, endurance and fall risk.  Continue PT per POC.   Rehab Potential Good   PT Frequency 2x / week   PT Duration 4 weeks  plus eval   PT Treatment/Interventions ADLs/Self Care Home Management;Therapeutic exercise;Therapeutic activities;Functional mobility training;Gait training;Neuromuscular re-education;Balance training;Patient/family education   PT Next Visit Plan Start with Scifit for flexibility and strengthening.  Continue gait with RW vs Rollaotor to  determine most appropriate device working on decreased UE weight bearing.   Consulted and Agree with Plan of Care Patient;Family member/caregiver   Family Member Consulted wife-[name]      Patient will benefit from skilled therapeutic intervention in order to improve the following deficits and impairments:  Abnormal gait, Decreased balance, Decreased mobility, Decreased range of motion, Decreased strength, Difficulty walking, Postural dysfunction, Impaired tone  Visit Diagnosis: Other abnormalities of gait and mobility  Unsteadiness on feet  Muscle weakness (generalized)  Other symptoms and signs involving the nervous system     Problem List Patient Active Problem List   Diagnosis Date Noted  . Neuropathy ([place])   . Bulging discs   . Gastritis   . Parkinsonian syndrome (  [place])   . Hyperlipidemia     Narda Bonds 03/25/2016, 2:37 PM  [facility] [address] [place], [place], [address] Phone: [phone]   Fax:  [phone]  Name: Alex Frazier MRN: [id number] Date of Birth: 06/09/1946    Narda Bonds, [place] 03/25/2016 2:37 PM Phone: [phone] Fax: [phone]

## 2016-03-27 ENCOUNTER — Ambulatory Visit: Payer: Medicare Other | Admitting: Physical Therapy

## 2016-03-27 DIAGNOSIS — R2689 Other abnormalities of gait and mobility: Secondary | ICD-10-CM

## 2016-03-27 DIAGNOSIS — M6281 Muscle weakness (generalized): Secondary | ICD-10-CM

## 2016-03-27 DIAGNOSIS — R2681 Unsteadiness on feet: Secondary | ICD-10-CM

## 2016-03-27 DIAGNOSIS — R29818 Other symptoms and signs involving the nervous system: Secondary | ICD-10-CM | POA: Diagnosis not present

## 2016-03-27 NOTE — Patient Instructions (Signed)
Axial Extension (Chin Tuck)    Pull chin in and lengthen back of neck. Hold __10__ seconds while counting out loud. Repeat __10__ times. Do __1-2__ sessions per day.  http://gt2.exer.us/449   Copyright  VHI. All rights reserved.  Shoulder (Scapula) Retraction    Pull shoulders back, squeezing shoulder blades together. Repeat _10___ times per session. Do __1-2__ sessions per day. Position: Standing or sitting  Copyright  VHI. All rights reserved.    THINK ABOUT SITTING AS TALL AS THE WALL OR STANDING AS TALL AS THE WALL WITH YOUR BEST POSTURE.

## 2016-03-28 NOTE — Therapy (Signed)
[facility] [address] [place], [place], [address] Phone: [phone]   Fax:  [phone]  Physical Therapy Treatment  Patient Details  Name: Alex Frazier MRN: [id number] Date of Birth: Sep 29, 1945 Referring Provider: Dennard Schaumann  Encounter Date: 03/27/2016      PT End of Session - 03/28/16 1255    Visit Number 4   Number of Visits 9   Date for PT Re-Evaluation 05/06/16   Authorization Type Medicare/BCBS-GCODE every 10th visit   PT Start Time 1317   PT Stop Time 1403   PT Time Calculation (min) 46 min   Equipment Utilized During Treatment Gait belt   Activity Tolerance Patient tolerated treatment well   Behavior During Therapy [facility] for tasks assessed/performed  Pt reports he does not want to come back to therapy      Past Medical History  Diagnosis Date  . Bulging discs   . Gastritis   . Parkinsonian syndrome ([place])   . Hyperlipidemia   . Peripheral neuropathy ([place])   . B12 deficiency     Borderline  . Neuropathy [facility])     Past Surgical History  Procedure Laterality Date  . Appendectomy    . Back surgery      There were no vitals filed for this visit.      Subjective Assessment - 03/27/16 1320    Subjective No falls.  Wife requests review of HEP to clarify band position on lower extremities.   Patient is accompained by: Family member  wife   Pertinent History Parkinson's disease x 2-3 years   Patient Stated Goals Pt's goal is to improve walking and decrease falls.   Currently in Pain? Yes   Pain Score 4    Pain Location Neck   Pain Orientation Right;Left   Pain Descriptors / Indicators Aching   Pain Type Acute pain   Pain Onset 1 to 4 weeks ago   Pain Frequency Constant   Aggravating Factors  turning head   Pain Relieving Factors heat, medications               Therapeutic Exercise: Using yellow theraband, pt performs/reviews seated LAQ and marching provided as HEP last visit.   Therapist provides cues for band placement.           [facility] Adult PT Treatment/Exercise - 03/28/16 0001    Transfers   Transfers Sit to Stand;Stand to Sit   Sit to Stand 4: Min assist;4: Min guard;With upper extremity assist;From bed   Sit to Stand Details (indicate cue type and reason) Cues provided for technique of scooting forward, increased forward lean and upright posture upon standing.   Stand to Sit 4: Min guard;With upper extremity assist;To chair/3-in-1   Number of Reps 10 reps  then 5 reps   Comments Pt tends to use backs of legs to push up from mat.  Needs frequent cues for proper technique.   Ambulation/Gait   Ambulation/Gait Yes   Ambulation/Gait Assistance 5: Supervision;4: Min guard   Ambulation Distance (Feet) 220 Feet  x 2   Assistive device Rollator;Rolling walker;Other (Comment)  wearing L AFO   Gait Pattern Step-through pattern;Decreased step length - right;Decreased step length - left;Decreased dorsiflexion - right;Decreased dorsiflexion - left;Scissoring;Trunk flexed;Narrow base of support;Poor foot clearance - left;Poor foot clearance - right   Gait Comments Cues provided for upright posture, to decrease reliance on UE support of walker, to decrease forward lean on walker; cues for staying close to BOS of walker.  Discussed need to have walker at all times.  They currently use rollator for outdoors and may have access to a family member's rolling walker for home.  PT recommends use of rolling walker or rollator at home, as pt is so reliant on UE support.   Exercises   Exercises Knee/Hip   Knee/Hip Exercises: Aerobic   Stepper Seated Sci-Fit stepper x 8 minutes, level 1.5>2, cues to stay RPM > 80 for improved intensity of movement..      Gait activities-continued:  Pt ambulates 110 ft x 2 with rollator walker, with cues for upright posture, decreased UE reliance.  Neuro Re-education: In addition to transfer training with weightshifting and cues for proper  technqiue, -Postural re-education: -seated modified PWR! Up x 10 reps for forward lean then upright posture -seated at wall  With best upright posture-scapular retraction and neck retraction-cues for best upright posture.  Discussed how pt/wife can use visual/target of wall for improved posture.          PT Education - 03/28/16 1255    Education provided Yes   Education Details HEP review; HEP additions for postural exercises   Person(s) Educated Patient;Spouse   Methods Handout;Demonstration;Explanation   Comprehension Verbalized understanding;Returned demonstration;Verbal cues required             PT Long Term Goals - 03/07/16 1157    PT LONG TERM GOAL #1   Title Pt will verbalize understanding of HEP for improved balance, transfers, gait for improved functional mobility.  TARGET 04/06/16   Time 4   Period Weeks   Status New   PT LONG TERM GOAL #2   Title Pt will perform at least 6 of 10 reps of sit<>stand transfers with minimal UE support, for improved transfer efficiency and safety.   Time 4   Period Weeks   Status New   PT LONG TERM GOAL #3   Title Pt will improve TUG score to less than or equal to 16 seconds for decreased fall risk.   Time 4   Period Weeks   Status New   PT LONG TERM GOAL #4   Title Pt will perform at least 3 minutes of standing with intermittent UE support with supervision, for improved standing balance and performance of ADLs.   Time 4   Period Weeks   Status New   PT LONG TERM GOAL #5   Title Pt will verbalize understanding of fall prevention in home environment.   Time 4   Period Weeks   Status New               Plan - 03/28/16 1255    Clinical Impression Statement Provided review/reinforcement of HEP provided last visit.  Worked on walking activities with rollator vs rolling walker.  Pt appears to be safe with both devices; they may have access to rolling walker and PT encouraged consistent use of walker for improved safety with  gait.  Pt will continue to benefit from further skilled PT to address balance, posture, strength and gait.   Rehab Potential Good   PT Frequency 2x / week   PT Duration 4 weeks  plus eval   PT Treatment/Interventions ADLs/Self Care Home Management;Therapeutic exercise;Therapeutic activities;Functional mobility training;Gait training;Neuromuscular re-education;Balance training;Patient/family education   PT Next Visit Plan Standing balance/standing strengthening; work on sitting/standing PWR! Moves; weigthshifting in standing for decreased U weightbearing;   Consulted and Agree with Plan of Care Patient;Family member/caregiver   Family Member Consulted wife-[name]      Patient  will benefit from skilled therapeutic intervention in order to improve the following deficits and impairments:  Abnormal gait, Decreased balance, Decreased mobility, Decreased range of motion, Decreased strength, Difficulty walking, Postural dysfunction, Impaired tone  Visit Diagnosis: Other abnormalities of gait and mobility  Unsteadiness on feet  Muscle weakness (generalized)     Problem List Patient Active Problem List   Diagnosis Date Noted  . Neuropathy ([place])   . Bulging discs   . Gastritis   . Parkinsonian syndrome ([place])   . Hyperlipidemia     Tarique Loveall W. 03/28/2016, 12:59 PM  Frazier Butt., PT  Frazier Butt., PT [facility] [address] [place], [place], [address] Phone: [phone]   Fax:  [phone]  Name: Alex Frazier MRN: [id number] Date of Birth: 1946/03/15

## 2016-04-01 ENCOUNTER — Ambulatory Visit: Payer: Medicare Other | Admitting: Physical Therapy

## 2016-04-01 DIAGNOSIS — R29818 Other symptoms and signs involving the nervous system: Secondary | ICD-10-CM | POA: Diagnosis not present

## 2016-04-01 DIAGNOSIS — R2681 Unsteadiness on feet: Secondary | ICD-10-CM | POA: Diagnosis not present

## 2016-04-01 DIAGNOSIS — M6281 Muscle weakness (generalized): Secondary | ICD-10-CM

## 2016-04-01 DIAGNOSIS — R2689 Other abnormalities of gait and mobility: Secondary | ICD-10-CM

## 2016-04-01 NOTE — Therapy (Signed)
[facility] [address] [place], [place], [address] Phone: [phone]   Fax:  [phone]  Physical Therapy Treatment  Patient Details  Name: Alex Frazier MRN: [id number] Date of Birth: 22-Sep-1945 Referring Provider: Dennard Schaumann  Encounter Date: 04/01/2016      PT End of Session - 04/01/16 1423    Visit Number 5   Number of Visits 9   Date for PT Re-Evaluation 05/06/16   Authorization Type Medicare/BCBS-GCODE every 10th visit   PT Start Time 1106   PT Stop Time 1149   PT Time Calculation (min) 43 min   Equipment Utilized During Treatment Gait belt   Activity Tolerance Patient tolerated treatment well   Behavior During Therapy [facility] for tasks assessed/performed  Pt reports he does not want to come back to therapy      Past Medical History:  Diagnosis Date  . B12 deficiency    Borderline  . Bulging discs   . Gastritis   . Hyperlipidemia   . Neuropathy ([place])   . Parkinsonian syndrome ([place])   . Peripheral neuropathy [facility])     Past Surgical History:  Procedure Laterality Date  . APPENDECTOMY    . BACK SURGERY      There were no vitals filed for this visit.      Subjective Assessment - 04/01/16 1108    Subjective Denies falls or changes.  Not consistently performing exercises or walking program.   Patient is accompained by: Family member  wife and daughter   Pertinent History Parkinson's disease x 2-3 years   Patient Stated Goals Pt's goal is to improve walking and decrease falls.   Currently in Pain? Yes   Pain Score 2    Pain Location Neck   Pain Orientation Right;Left   Pain Descriptors / Indicators Aching   Pain Type Acute pain   Pain Onset 1 to 4 weeks ago   Pain Frequency Constant   Aggravating Factors  turning head   Pain Relieving Factors massage, heat medication        Standing at counter for bil LE hip abduction, hamstring curl and hip flexion x 10 reps;partial squats at counter x  10 x 2 with verbal and tactile cues to perform correctly. Forward step weight shifts at counter with back of chair for additional UE support x 10 x 2 with cues.  Tends to rotate stationary leg externally for stability and has decreased BOS.  Sit<>stand x 10 x 2 from mat then x 10 x 2 from chair with cues for forward weight shift and foot placement.  Gait with Rollator x 300' x 1 and 110' x 1 with min guard assist.  Pt needs cues to decrease reliance on UE's and relax shoulders.  Used mirror for visual cues as well.        PT Education - 04/01/16 1421    Education provided Yes   Education Details Importance of increased activity and exercises at home   Person(s) Educated Patient;Spouse;Child(ren)   Methods Explanation   Comprehension Verbalized understanding;Other (comment)  need continued reinforcement             PT Long Term Goals - 03/07/16 1157      PT LONG TERM GOAL #1   Title Pt will verbalize understanding of HEP for improved balance, transfers, gait for improved functional mobility.  TARGET 04/06/16   Time 4   Period Weeks   Status New     PT LONG TERM GOAL #2  Title Pt will perform at least 6 of 10 reps of sit<>stand transfers with minimal UE support, for improved transfer efficiency and safety.   Time 4   Period Weeks   Status New     PT LONG TERM GOAL #3   Title Pt will improve TUG score to less than or equal to 16 seconds for decreased fall risk.   Time 4   Period Weeks   Status New     PT LONG TERM GOAL #4   Title Pt will perform at least 3 minutes of standing with intermittent UE support with supervision, for improved standing balance and performance of ADLs.   Time 4   Period Weeks   Status New     PT LONG TERM GOAL #5   Title Pt will verbalize understanding of fall prevention in home environment.   Time 4   Period Weeks   Status New               Plan - 04/01/16 1423    Clinical Impression Statement Continue to reiterate importance  of mobility and HEP outside of therapy session to improve strength and endurance.  Pt worked well during session today.  Continue PT per POC.   Rehab Potential Good   PT Frequency 2x / week   PT Duration 4 weeks  plus eval   PT Treatment/Interventions ADLs/Self Care Home Management;Therapeutic exercise;Therapeutic activities;Functional mobility training;Gait training;Neuromuscular re-education;Balance training;Patient/family education   PT Next Visit Plan Provide standing balance/strengthening exercises at counter; work on sitting/standing PWR! Moves   Consulted and Agree with Plan of Care Patient;Family member/caregiver   Family Member Consulted wife-[name] and daughter-Amy      Patient will benefit from skilled therapeutic intervention in order to improve the following deficits and impairments:  Abnormal gait, Decreased balance, Decreased mobility, Decreased range of motion, Decreased strength, Difficulty walking, Postural dysfunction, Impaired tone  Visit Diagnosis: Other abnormalities of gait and mobility  Unsteadiness on feet  Muscle weakness (generalized)  Other symptoms and signs involving the nervous system     Problem List Patient Active Problem List   Diagnosis Date Noted  . Neuropathy ([place])   . Bulging discs   . Gastritis   . Parkinsonian syndrome ([place])   . Hyperlipidemia     Narda Bonds 04/01/2016, 2:33 PM  [facility] [address] [place], [place], [address] Phone: [phone]   Fax:  [phone]  Name: Alex Frazier MRN: [id number] Date of Birth: 10-15-1945   Narda Bonds, [place] 04/01/16 2:33 PM Phone: [phone] Fax: [phone]

## 2016-04-03 ENCOUNTER — Ambulatory Visit: Payer: Medicare Other | Admitting: Physical Therapy

## 2016-04-03 DIAGNOSIS — R2681 Unsteadiness on feet: Secondary | ICD-10-CM

## 2016-04-03 DIAGNOSIS — R29818 Other symptoms and signs involving the nervous system: Secondary | ICD-10-CM

## 2016-04-03 DIAGNOSIS — R2689 Other abnormalities of gait and mobility: Secondary | ICD-10-CM

## 2016-04-03 DIAGNOSIS — M6281 Muscle weakness (generalized): Secondary | ICD-10-CM | POA: Diagnosis not present

## 2016-04-03 NOTE — Therapy (Signed)
[facility] [address] [place], [place], [address] Phone: [phone]   Fax:  [phone]  Physical Therapy Treatment  Patient Details  Name: Alex Frazier MRN: [id number] Date of Birth: 26-Nov-1945 Referring Provider: Dennard Schaumann  Encounter Date: 04/03/2016      PT End of Session - 04/03/16 1238    Visit Number 6   Number of Visits 9   Date for PT Re-Evaluation 05/06/16   Authorization Type Medicare/BCBS-GCODE every 10th visit   PT Start Time 1105   PT Stop Time 1148   PT Time Calculation (min) 43 min   Activity Tolerance Patient tolerated treatment well   Behavior During Therapy [facility] for tasks assessed/performed      Past Medical History:  Diagnosis Date  . B12 deficiency    Borderline  . Bulging discs   . Gastritis   . Hyperlipidemia   . Neuropathy ([place])   . Parkinsonian syndrome ([place])   . Peripheral neuropathy [facility])     Past Surgical History:  Procedure Laterality Date  . APPENDECTOMY    . BACK SURGERY      There were no vitals filed for this visit.      Subjective Assessment - 04/03/16 1111    Subjective Had bad night with neck pain.  Was on heating pad for 5 hours.   Patient is accompained by: Family member  wife   Pertinent History Parkinson's disease x 2-3 years   Patient Stated Goals Pt's goal is to improve walking and decrease falls.   Currently in Pain? Yes   Pain Score 3    Pain Location Neck   Pain Orientation Right;Left   Pain Descriptors / Indicators Aching   Pain Type Acute pain   Pain Onset 1 to 4 weeks ago   Pain Frequency Constant   Aggravating Factors  turning head   Pain Relieving Factors massage, heat   Multiple Pain Sites Yes   Pain Score 3   Pain Location Shoulder   Pain Orientation Left   Pain Descriptors / Indicators Aching   Pain Type Acute pain   Pain Onset 1 to 4 weeks ago   Pain Frequency Constant   Aggravating Factors  from neck   Pain Relieving Factors  heating pad          Balance Exercises - 04/03/16 1205      OTAGO PROGRAM   Head Movements Sitting;5 reps   Neck Movements Sitting;5 reps   Back Extension Standing;5 reps  holding Rollator   Trunk Movements Standing;5 reps  holding rollator   Ankle Movements Sitting;10 reps   Knee Extensor 10 reps;Weight (comment)  yellow theraband   Knee Flexor 10 reps;Weight (comment)  yellow theraband   Hip ABductor 10 reps  yellow theraband   Ankle Plantorflexors --  unable in standing   Ankle Dorsiflexors 20 reps, support   Knee Bends 10 reps, support   Backwards Walking Support   Sideways Walking Assistive device  counter for support   Overall OTAGO Comments Instructed pt and wife to bring booklet in for next visit to continue to add to program.  Also discussed having supervision of wife for safety with any standing exercises.           PT Education - 04/03/16 1237    Education provided Yes   Education Details OTAGO program   Person(s) Educated Patient;Spouse   Methods Explanation;Demonstration;Verbal cues;Handout   Comprehension Verbalized understanding;Returned demonstration  PT Long Term Goals - 03/07/16 1157      PT LONG TERM GOAL #1   Title Pt will verbalize understanding of HEP for improved balance, transfers, gait for improved functional mobility.  TARGET 04/06/16   Time 4   Period Weeks   Status New     PT LONG TERM GOAL #2   Title Pt will perform at least 6 of 10 reps of sit<>stand transfers with minimal UE support, for improved transfer efficiency and safety.   Time 4   Period Weeks   Status New     PT LONG TERM GOAL #3   Title Pt will improve TUG score to less than or equal to 16 seconds for decreased fall risk.   Time 4   Period Weeks   Status New     PT LONG TERM GOAL #4   Title Pt will perform at least 3 minutes of standing with intermittent UE support with supervision, for improved standing balance and performance of ADLs.   Time 4    Period Weeks   Status New     PT LONG TERM GOAL #5   Title Pt will verbalize understanding of fall prevention in home environment.   Time 4   Period Weeks   Status New               Plan - 04/03/16 1239    Clinical Impression Statement Pt slowly progressing toward goals and did work on exercises since last visit.  Continue PT per POC.   Rehab Potential Good   PT Frequency 2x / week   PT Duration 4 weeks  plus eval   PT Treatment/Interventions ADLs/Self Care Home Management;Therapeutic exercise;Therapeutic activities;Functional mobility training;Gait training;Neuromuscular re-education;Balance training;Patient/family education   PT Next Visit Plan Finish OTAGO instruction   Consulted and Agree with Plan of Care Patient;Family member/caregiver   Family Member Consulted wife-[name]      Patient will benefit from skilled therapeutic intervention in order to improve the following deficits and impairments:  Abnormal gait, Decreased balance, Decreased mobility, Decreased range of motion, Decreased strength, Difficulty walking, Postural dysfunction, Impaired tone  Visit Diagnosis: Other abnormalities of gait and mobility  Unsteadiness on feet  Muscle weakness (generalized)  Other symptoms and signs involving the nervous system     Problem List Patient Active Problem List   Diagnosis Date Noted  . Neuropathy ([place])   . Bulging discs   . Gastritis   . Parkinsonian syndrome ([place])   . Hyperlipidemia     Narda Bonds 04/03/2016, 12:40 PM  [facility] [address] [place], [place], [address] Phone: [phone]   Fax:  [phone]  Name: Alex Frazier MRN: [id number] Date of Birth: 01/19/1946   Narda Bonds, [place] 04/03/16 12:40 PM Phone: [phone] Fax: [phone]

## 2016-04-03 NOTE — Patient Instructions (Signed)
Provided OTAGO Booklet

## 2016-04-07 ENCOUNTER — Encounter: Payer: Self-pay | Admitting: Family Medicine

## 2016-04-07 ENCOUNTER — Ambulatory Visit (INDEPENDENT_AMBULATORY_CARE_PROVIDER_SITE_OTHER): Payer: Medicare Other | Admitting: Family Medicine

## 2016-04-07 DIAGNOSIS — M5030 Other cervical disc degeneration, unspecified cervical region: Secondary | ICD-10-CM | POA: Insufficient documentation

## 2016-04-07 MED ORDER — MELOXICAM 7.5 MG PO TABS: 7.5000 mg | ORAL_TABLET | Freq: Every day | ORAL | 1 refills | Status: DC

## 2016-04-07 NOTE — Progress Notes (Signed)
Patient ID: Alex Frazier, male   DOB: September 01, 1946, 70 y.o.   MRN: [id number]    Subjective:    Patient ID: Alex Frazier, male    DOB: July 12, 1946, 70 y.o.   MRN: [id number]  Patient presents for Neck Pain (reports that neck pain has not improved- also reports shoulder pain) Patient with persistent neck pain. He was seen on July 7 with neck pain for the past few months. He had multiple falls he does have under line Parkinson's disease as well as peripheral neuropathies currently in physical therapy for his gait instability. X-ray was done which did not show any fracture his pain was mostly located around the C3-C4 level he does have degenerative changes with some disc space narrowing and osteophyte that it changes at C5-C6 he also had some mild foraminal narrowing around C3-C6. This thing he was having a lot of spasm as well. He was placed on prednisone taper and also given Valium 5 mg as needed. The next tablet on to be physical therapy with TENS unit The medicine seemed to help in the beginning but now he's still getting sharp pains and is unable to turn his neck is freely. He was given some exercises from a therapist last week but he has not been able to do these because of discomfort. He gets it mostly into the left side of his shoulder as well as his upper back and his neck denies any tingling or numbness in the fingertips    Review Of Systems:  GEN- denies fatigue, fever, weight loss,weakness, recent illness HEENT- denies eye drainage, change in vision, nasal discharge, CVS- denies chest pain, palpitations RESP- denies SOB, cough, wheeze MSK-+ joint pain, muscle aches, injury Neuro- denies headache, dizziness, syncope, seizure activity       Objective:    BP 120/74 (BP Location: Left Arm, Patient Position: Sitting, Cuff Size: Normal)   Pulse 78   Temp 98 F (36.7 C) (Oral)   Resp 14   Ht 5\' 8"  (1.727 m)   Wt 168 lb (76.2 kg)   BMI 25.54 kg/m  GEN- NAD, alert and oriented  x3 HEENT- PERRL, EOMI, non injected sclera, pink conjunctiva, MMM, oropharynx clear Neck- Supple, decreased ROM with flexion/ext and rotation, TTP upper trapezius, very stiff, mild TTP over C3-C4 region, neg spurlings,  MSK- Thoracic spine NT  EXT- No edema Pulses- Radial 2+        Assessment & Plan:      Problem List Items Addressed This Visit    DDD (degenerative disc disease), cervical    DDD with some foraminal narrowing, no loss of disc height noted Trial of Meloxicam once a day, has tolerated ibuprofen Will get set up with therapy tomorrow to start working on neck- TENS unit/ Iontophoresis okay to try  If he fails PT recommend further imaging with MRI or CT of neck and referral to ortho      Relevant Medications   meloxicam (MOBIC) 7.5 MG tablet   Other Relevant Orders   Ambulatory referral to Physical Therapy    Other Visit Diagnoses   None.     Note: This dictation was prepared with Dragon dictation along with smaller phrase technology. Any transcriptional errors that result from this process are unintentional.

## 2016-04-07 NOTE — Patient Instructions (Addendum)
Take meloxicam with food once a day  Therapy for your neck F/U Dr. Dennard Schaumann in 4 weeks if no improvement with therapy

## 2016-04-07 NOTE — Assessment & Plan Note (Signed)
DDD with some foraminal narrowing, no loss of disc height noted Trial of Meloxicam once a day, has tolerated ibuprofen Will get set up with therapy tomorrow to start working on neck- TENS unit/ Iontophoresis okay to try

## 2016-04-08 ENCOUNTER — Ambulatory Visit: Payer: Medicare Other | Attending: Family Medicine | Admitting: Physical Therapy

## 2016-04-08 DIAGNOSIS — R2689 Other abnormalities of gait and mobility: Secondary | ICD-10-CM | POA: Insufficient documentation

## 2016-04-08 DIAGNOSIS — R29818 Other symptoms and signs involving the nervous system: Secondary | ICD-10-CM

## 2016-04-08 DIAGNOSIS — R293 Abnormal posture: Secondary | ICD-10-CM | POA: Diagnosis not present

## 2016-04-08 DIAGNOSIS — M6281 Muscle weakness (generalized): Secondary | ICD-10-CM | POA: Diagnosis not present

## 2016-04-08 DIAGNOSIS — R2681 Unsteadiness on feet: Secondary | ICD-10-CM | POA: Insufficient documentation

## 2016-04-08 NOTE — Therapy (Signed)
[facility] [address] [place], [place], [address] Phone: [phone]   Fax:  [phone]  Physical Therapy Treatment  Patient Details  Name: Alex Frazier MRN: [id number] Date of Birth: 12-22-1945 Referring Provider: Dennard Schaumann  Encounter Date: 04/08/2016      PT End of Session - 04/08/16 1559    Visit Number 7   Number of Visits 9   Date for PT Re-Evaluation 05/06/16   Authorization Type Medicare/BCBS-GCODE every 10th visit   PT Start Time 1104   PT Stop Time 1146   PT Time Calculation (min) 42 min   Activity Tolerance Patient tolerated treatment well   Behavior During Therapy [facility] for tasks assessed/performed      Past Medical History:  Diagnosis Date  . B12 deficiency    Borderline  . Bulging discs   . Gastritis   . Hyperlipidemia   . Neuropathy ([place])   . Parkinsonian syndrome ([place])   . Peripheral neuropathy [facility])     Past Surgical History:  Procedure Laterality Date  . APPENDECTOMY    . BACK SURGERY      There were no vitals filed for this visit.      Subjective Assessment - 04/08/16 1555    Subjective Pt reports having increased neck pain and went to the MD again.  "Did you get the orders from his doctor?" stated wife.     Patient is accompained by: Family member  wife   Pertinent History Parkinson's disease x 2-3 years   Patient Stated Goals Pt's goal is to improve walking and decrease falls.   Currently in Pain? Yes   Pain Score 6   decreased to 2/10 after session   Pain Location Neck   Pain Orientation Right;Left   Pain Type Acute pain   Pain Onset 1 to 4 weeks ago   Pain Frequency Intermittent   Aggravating Factors  turning head   Pain Relieving Factors heat, massage, meds       Hot pack x 10 minutes to upper trapezius.  Skin inspected after 3 and 5 minutes and at end of treatment with no issues observed.  Manual therapy to bil trapezius in supine and in sitting along with  head/neck lateral flexion and rotation.  Discussed MD order and need to primary PT to assess at next session for neck pain and treatment options as well at to determine if recertification needed for additional visits.  Pt and wife agree.        PT Education - 04/08/16 1558    Education provided Yes   Education Details Primary PT to determine treatment for neck pain at next sesssion;using heat for only 30-60 minutes then off for 30-60 minutes;trying moist heating pad for home use   Person(s) Educated Patient;Spouse   Methods Explanation   Comprehension Verbalized understanding             PT Long Term Goals - 03/07/16 1157      PT LONG TERM GOAL #1   Title Pt will verbalize understanding of HEP for improved balance, transfers, gait for improved functional mobility.  TARGET 04/06/16   Time 4   Period Weeks   Status New     PT LONG TERM GOAL #2   Title Pt will perform at least 6 of 10 reps of sit<>stand transfers with minimal UE support, for improved transfer efficiency and safety.   Time 4   Period Weeks   Status New     PT  LONG TERM GOAL #3   Title Pt will improve TUG score to less than or equal to 16 seconds for decreased fall risk.   Time 4   Period Weeks   Status New     PT LONG TERM GOAL #4   Title Pt will perform at least 3 minutes of standing with intermittent UE support with supervision, for improved standing balance and performance of ADLs.   Time 4   Period Weeks   Status New     PT LONG TERM GOAL #5   Title Pt will verbalize understanding of fall prevention in home environment.   Time 4   Period Weeks   Status New               Plan - 04/08/16 1600    Clinical Impression Statement Pt limited today by neck pain and reports not being able to work on exercises at home due to pain.  Continues with elevated shoulders and forward head and increased UE pressure with ambulation.  Continue PT per POC.   Rehab Potential Good   PT Frequency 2x / week    PT Duration 4 weeks  plus eval   PT Treatment/Interventions ADLs/Self Care Home Management;Therapeutic exercise;Therapeutic activities;Functional mobility training;Gait training;Neuromuscular re-education;Balance training;Patient/family education   PT Next Visit Plan Primary PT to assess for neck pain/treatment.  [place] instruction if time allows and pt able to tolerate.   Consulted and Agree with Plan of Care Patient;Family member/caregiver   Family Member Consulted wife-[name]      Patient will benefit from skilled therapeutic intervention in order to improve the following deficits and impairments:  Abnormal gait, Decreased balance, Decreased mobility, Decreased range of motion, Decreased strength, Difficulty walking, Postural dysfunction, Impaired tone  Visit Diagnosis: Other symptoms and signs involving the nervous system  Muscle weakness (generalized)     Problem List Patient Active Problem List   Diagnosis Date Noted  . DDD (degenerative disc disease), cervical 04/07/2016  . Neuropathy ([place])   . Bulging discs   . Gastritis   . Parkinsonian syndrome ([place])   . Hyperlipidemia     Narda Bonds 04/08/2016, 4:07 PM  [facility] [address] [place], [place], [address] Phone: [phone]   Fax:  [phone]  Name: Alex Frazier MRN: [id number] Date of Birth: October 29, 1945   Einar Grad [place] 04/08/16 4:07 PM Phone: [phone] Fax: [phone]

## 2016-04-09 ENCOUNTER — Ambulatory Visit: Payer: Medicare Other | Admitting: Physical Therapy

## 2016-04-09 DIAGNOSIS — R29818 Other symptoms and signs involving the nervous system: Secondary | ICD-10-CM

## 2016-04-09 DIAGNOSIS — R293 Abnormal posture: Secondary | ICD-10-CM

## 2016-04-09 DIAGNOSIS — R2689 Other abnormalities of gait and mobility: Secondary | ICD-10-CM

## 2016-04-09 DIAGNOSIS — M6281 Muscle weakness (generalized): Secondary | ICD-10-CM

## 2016-04-09 DIAGNOSIS — R2681 Unsteadiness on feet: Secondary | ICD-10-CM | POA: Diagnosis not present

## 2016-04-09 NOTE — Therapy (Signed)
[facility] [address] [place], [place], [address] Phone: [phone]   Fax:  [phone]  Physical Therapy Treatment  Patient Details  Name: Alex Frazier MRN: [id number] Date of Birth: 1946/06/30 Referring Provider: Dennard Schaumann  Encounter Date: 04/08/2016      PT End of Session - 04/08/16 1559    Visit Number 7   Number of Visits 9   Date for PT Re-Evaluation 05/06/16   Authorization Type Medicare/BCBS-GCODE every 10th visit   PT Start Time 1104   PT Stop Time 1146   PT Time Calculation (min) 42 min   Activity Tolerance Patient tolerated treatment well   Behavior During Therapy [facility] for tasks assessed/performed      Past Medical History:  Diagnosis Date  . B12 deficiency    Borderline  . Bulging discs   . Gastritis   . Hyperlipidemia   . Neuropathy ([place])   . Parkinsonian syndrome ([place])   . Peripheral neuropathy [facility])     Past Surgical History:  Procedure Laterality Date  . APPENDECTOMY    . BACK SURGERY      There were no vitals filed for this visit.      Subjective Assessment - 04/08/16 1555    Subjective Pt reports having increased neck pain and went to the MD again.  "Did you get the orders from his doctor?" stated wife.     Patient is accompained by: Family member  wife   Pertinent History Parkinson's disease x 2-3 years   Patient Stated Goals Pt's goal is to improve walking and decrease falls.   Currently in Pain? Yes   Pain Score 6   decreased to 2/10 after session   Pain Location Neck   Pain Orientation Right;Left   Pain Type Acute pain   Pain Onset 1 to 4 weeks ago   Pain Frequency Intermittent   Aggravating Factors  turning head   Pain Relieving Factors heat, massage, meds                                 PT Education - 04/08/16 1558    Education provided Yes   Education Details Primary PT to determine treatment for neck pain at next sesssion;using heat  for only 30-60 minutes then off for 30-60 minutes;trying moist heating pad for home use   Person(s) Educated Patient;Spouse   Methods Explanation   Comprehension Verbalized understanding             PT Long Term Goals - 03/07/16 1157      PT LONG TERM GOAL #1   Title Pt will verbalize understanding of HEP for improved balance, transfers, gait for improved functional mobility.  TARGET 04/06/16   Time 4   Period Weeks   Status New     PT LONG TERM GOAL #2   Title Pt will perform at least 6 of 10 reps of sit<>stand transfers with minimal UE support, for improved transfer efficiency and safety.   Time 4   Period Weeks   Status New     PT LONG TERM GOAL #3   Title Pt will improve TUG score to less than or equal to 16 seconds for decreased fall risk.   Time 4   Period Weeks   Status New     PT LONG TERM GOAL #4   Title Pt will perform at least 3 minutes of standing with intermittent UE  support with supervision, for improved standing balance and performance of ADLs.   Time 4   Period Weeks   Status New     PT LONG TERM GOAL #5   Title Pt will verbalize understanding of fall prevention in home environment.   Time 4   Period Weeks   Status New               Plan - 04/08/16 1600    Clinical Impression Statement Pt limited today by neck pain and reports not being able to work on exercises at home due to pain.  Continues with elevated shoulders and forward head and increased UE pressure with ambulation.  Continue PT per POC.   Rehab Potential Good   PT Frequency 2x / week   PT Duration 4 weeks  plus eval   PT Treatment/Interventions ADLs/Self Care Home Management;Therapeutic exercise;Therapeutic activities;Functional mobility training;Gait training;Neuromuscular re-education;Balance training;Patient/family education   PT Next Visit Plan Primary PT to assess for neck pain/treatment.  [place] instruction if time allows and pt able to tolerate.   Consulted and Agree  with Plan of Care Patient;Family member/caregiver   Family Member Consulted wife-[name]      Patient will benefit from skilled therapeutic intervention in order to improve the following deficits and impairments:  Abnormal gait, Decreased balance, Decreased mobility, Decreased range of motion, Decreased strength, Difficulty walking, Postural dysfunction, Impaired tone  Visit Diagnosis: Other symptoms and signs involving the nervous system  Muscle weakness (generalized)     Problem List Patient Active Problem List   Diagnosis Date Noted  . DDD (degenerative disc disease), cervical 04/07/2016  . Neuropathy ([place])   . Bulging discs   . Gastritis   . Parkinsonian syndrome ([place])   . Hyperlipidemia    The above treatment session was provided by Nita Sells, LPTA.  She discussed changes in orders, as noted above with primary PT.  This PT will assess further at next visit to determine updates to POC to assess pain. MARRIOTT,AMY W. 04/09/2016, 10:07 AM Mady Haagensen, PT 04/09/16 10:10 AM Phone: [phone] Fax: [facility] [address] [place], [place], [address] Phone: [phone]   Fax:  [phone]  Name: DERAL CHRISTIANS MRN: [id number] Date of Birth: 21-Jun-1946

## 2016-04-10 NOTE — Patient Instructions (Signed)
Provided patient with osteoporosis based decompression position exercises in supine:  -shoulder press x 5 reps  -head press x 5 reps  To be performed once per day.

## 2016-04-10 NOTE — Therapy (Addendum)
[facility] [address] [place], [place], [address] Phone: [phone]   Fax:  [phone]  Physical Therapy Treatment  Patient Details  Name: Alex Frazier MRN: [id number] Date of Birth: 1946/04/07 Referring Provider: Dennard Schaumann  Encounter Date: 04/09/2016      PT End of Session - 04/10/16 1843    Visit Number 8   Number of Visits 16   Date for PT Re-Evaluation 06/08/16   Authorization Type Medicare/BCBS-GCODE every 10th visit   PT Start Time 1109   PT Stop Time 1149   PT Time Calculation (min) 40 min   Activity Tolerance Patient tolerated treatment well   Behavior During Therapy [facility] for tasks assessed/performed      Past Medical History:  Diagnosis Date  . B12 deficiency    Borderline  . Bulging discs   . Gastritis   . Hyperlipidemia   . Neuropathy ([place])   . Parkinsonian syndrome ([place])   . Peripheral neuropathy [facility])     Past Surgical History:  Procedure Laterality Date  . APPENDECTOMY    . BACK SURGERY      There were no vitals filed for this visit.      Subjective Assessment - 04/09/16 1112    Subjective Used heat last night some and the heat may have helped.   Patient is accompained by: Family member  wife   Pertinent History Parkinson's disease x 2-3 years   Patient Stated Goals Pt's goal is to improve walking and decrease falls.   Currently in Pain? Yes   Pain Score 4   3/10 at end of session   Pain Location Neck   Pain Orientation Right;Left   Pain Descriptors / Indicators Aching;Sharp   Pain Type Acute pain   Pain Onset 1 to 4 weeks ago   Pain Frequency Intermittent   Aggravating Factors  turning head   Pain Relieving Factors heat, massage, medications         Neuro Re-education:   -Sit<>stand x 8 reps from 18" chair, with definite use of UE support, with slight posterior lean upon standing.  Cues for scooting forward in chair and increased forward lean. -TUG:  Several trials  needed, as pt needs cues for sequence of test.  TUG score:  20.65 seconds with rollator walker. -Attempted standing at edge of mat, with rollator in front of patient, several reps, with pt unable to stand >10 seconds without UE support.  There Ex:   -Supine decompression exercises:  Neck retraction x 5 reps, then shoulder press x 5 reps, 2 sets, with tactile cues. -Passive ROM/stretch in supine, 3 reps each side to upper traps. -Passive ROM/stretch into gentle distraction at occipital condyles, with pt in supine positioning.  Self Care: Discussed pt's neck/cervical pain.  Discussed pt's new orders, POC updates.  Discussed posture/positioning role in pt's pain due to overstretching of neck and upper back musculature.  Discussed postural awareness in addition to performance of exercises, throughout the day.  Pt and wife in agreement to continue with therapy and to address neck pain through combination of exercises for postural strengthening and repositioning, as well as possibility of manual/passive stretching, modalities (ultrasound/heat>TENS for home if needed).  Pt/wife in agreement.                 [facility] Adult PT Treatment/Exercise - 04/10/16 1411      Posture/Postural Control   Posture/Postural Control Postural limitations   Postural Limitations Rounded Shoulders;Forward head;Posterior pelvic tilt  Limitations in neck ROM:  Neck flexion 18 degrees, neck extension 13 degrees.  Lateral neck flexion to R:  14 degrees, to L:  14 degrees.  Neck rotation to R:  43 degrees, to L:  41 degrees.           PT Education - 04/10/16 1841    Education provided Yes   Education Details Additions to HEP-see instructions; continue to perform seated scapular retraction and neck retraction (best posture) exercises; awareness of posture; updates to POC   Person(s) Educated Patient;Spouse   Methods Explanation;Demonstration;Handout   Comprehension Verbalized understanding;Returned  demonstration             PT Long Term Goals - 04/09/16 1114      PT LONG TERM GOAL #1   Title Pt will verbalize understanding of HEP for improved balance, transfers, gait for improved functional mobility.  TARGET 04/06/16  GOAL EXTENDED:  New TARGET DATE 05/09/16   Time 4  4 weeks-recert 09/09/85 visit   Period Weeks   Status On-going     PT LONG TERM GOAL #2   Title Pt will perform at least 6 of 10 reps of sit<>stand transfers with minimal UE support, for improved transfer efficiency and safety.  EXTENDED GOAL:  NEW TARGET DATE:  05/09/16   Baseline Pt needs definite UE support for sit<>stand transfers.   Time 4  4 weeks per recert 04/14/75   Period Weeks   Status On-going     PT LONG TERM GOAL #3   Title Pt will improve TUG score to less than or equal to 16 seconds for decreased fall risk.  GOAL EXTENDED:  NEW TARGET DATE:  05/09/16   Baseline 20.65 sec 04/09/16   Time 4  4 weeks-per recert 03/09/08   Period Weeks   Status Not Met     PT LONG TERM GOAL #4   Title Pt will perform at least 3 minutes of standing with intermittent UE support with supervision, for improved standing balance and performance of ADLs.  GOAL EXTENDED:  NEW TARGET DATE 05/09/16   Baseline Pt stands <10 seconds without UE support prior to LOB posteriorly.   Time 4  4 weeks-per recert 12/14/07   Period Weeks   Status On-going     PT LONG TERM GOAL #5   Title Pt will verbalize understanding of fall prevention in home environment.  GOAL EXTENDED:  NEW TARGET DATE:  05/09/16   Time 4  4 weeks per recert 02/07/82   Period Weeks   Status On-going     Additional Long Term Goals   Additional Long Term Goals Yes     PT LONG TERM GOAL #6   Title Pt will verbalize understanding of at least 3 means to manage cervical/neck pain with functional activities.  TARGET DATE:  05/09/16   Baseline Pain rated 4/10 bilateral upper traps   Time 4   Period Weeks   Status New               Plan - 04/10/16 1844    Clinical  Impression Statement Goals checked today-pt has not yet fully met LTGs, and they remain appropriate.  Pt has had exacerbation of neck and cervical pain, likely due somewhat to posture/positioning with forward head, rounded and elevated shoulders, over time.  Pt noted to have increased tightness in bilateral traps and tender to palpation along occipital condyles bilaterally.  Pt will benefit from focused work on posture, positioning through exercises as well as pain  reduction/management through possible use of modalities.  See recert/update to POC this visit.   Rehab Potential Good   PT Frequency 2x / week   PT Duration 4 weeks  plus eval   PT Treatment/Interventions ADLs/Self Care Home Management;Therapeutic exercise;Therapeutic activities;Functional mobility training;Gait training;Neuromuscular re-education;Balance training;Patient/family education;Moist Heat;Ultrasound;Electrical Stimulation;Iontophoresis 49m/ml Dexamethasone;Manual techniques;Passive range of motion   PT Next Visit Plan neck stretching, massage/possible ultrasound to upper and middle traps.  Instruct in stretching and additional posture exercises as needed.   Consulted and Agree with Plan of Care Patient;Family member/caregiver   Family Member Consulted wife-[name]      Patient will benefit from skilled therapeutic intervention in order to improve the following deficits and impairments:  Abnormal gait, Decreased balance, Decreased mobility, Decreased range of motion, Decreased strength, Difficulty walking, Postural dysfunction, Impaired tone  Visit Diagnosis: Other symptoms and signs involving the nervous system  Abnormal posture  Muscle weakness (generalized)  Other abnormalities of gait and mobility  Unsteadiness on feet     Problem List Patient Active Problem List   Diagnosis Date Noted  . DDD (degenerative disc disease), cervical 04/07/2016  . Neuropathy ([place]   . Bulging discs   . Gastritis   . Parkinsonian  syndrome ([place]   . Hyperlipidemia     Randee Huston W. 04/10/2016, 6:51 PM [place] [address]Phone: [phone]  Fax:  [phone] Name: Alex RICKETSONMRN: [id number] of Birth: 103/12/1945 AMady Haagensen PT 04/12/16 8:56 AM Phone: [phone]: [phone]

## 2016-04-16 ENCOUNTER — Ambulatory Visit: Payer: Medicare Other | Admitting: Physical Therapy

## 2016-04-16 DIAGNOSIS — R2689 Other abnormalities of gait and mobility: Secondary | ICD-10-CM

## 2016-04-16 DIAGNOSIS — R2681 Unsteadiness on feet: Secondary | ICD-10-CM | POA: Diagnosis not present

## 2016-04-16 DIAGNOSIS — M6281 Muscle weakness (generalized): Secondary | ICD-10-CM | POA: Diagnosis not present

## 2016-04-16 DIAGNOSIS — R293 Abnormal posture: Secondary | ICD-10-CM | POA: Diagnosis not present

## 2016-04-16 DIAGNOSIS — R29818 Other symptoms and signs involving the nervous system: Secondary | ICD-10-CM | POA: Diagnosis not present

## 2016-04-16 NOTE — Therapy (Signed)
[facility] [address] [place], [place], [address] Phone: [phone]   Fax:  [phone]  Physical Therapy Treatment  Patient Details  Name: Alex Frazier MRN: [id number] Date of Birth: 03/17/1946 Referring Provider: Dennard Schaumann  Encounter Date: 04/16/2016      PT End of Session - 04/16/16 1113    Visit Number 9   Number of Visits 16   PT Start Time 1020   PT Stop Time 1105   PT Time Calculation (min) 45 min   Activity Tolerance Patient tolerated treatment well   Behavior During Therapy [facility] for tasks assessed/performed      Past Medical History:  Diagnosis Date  . B12 deficiency    Borderline  . Bulging discs   . Gastritis   . Hyperlipidemia   . Neuropathy ([place])   . Parkinsonian syndrome ([place])   . Peripheral neuropathy [facility])     Past Surgical History:  Procedure Laterality Date  . APPENDECTOMY    . BACK SURGERY      There were no vitals filed for this visit.      Subjective Assessment - 04/16/16 1107    Subjective Pt stated he doesn't feel he's made any progress; wife states he is more balanced; pt's main focus is his neck pain;  wife stated imaging was negative; imaging stated ther was mild degenerative change with no acute abnormality   Currently in Pain? Yes   Pain Score 5    Pain Location Neck   Pain Orientation Right;Left   Pain Descriptors / Indicators Aching   Pain Onset 1 to 4 weeks ago   Pain Frequency Intermittent   Aggravating Factors  posture; turning head   Pain Relieving Factors heat, massage , meds   Multiple Pain Sites Yes   Pain Score 5   Pain Location Shoulder   Pain Orientation Left   Pain Onset 1 to 4 weeks ago   Pain Frequency Intermittent   Aggravating Factors  shoulder mvt overhead   Pain Relieving Factors rest         Manual therapy; Pt positioned supine; eventually fully supine w/no pillow to support; grade 2-4 posterior to anterior cervical vertebrae  mobilization and STM to posterior cervical musculature ; gentle UE stretching with overhead reach/assisted                        PT Education - 04/16/16 1112    Education provided Yes   Education Details discussed PD dx and how the HEP is set up to promote extension mvts and fight the effects of PD that are affecting his posture and overall mvt.   Also educated/encouraged on a shift of using what he can do to help fight the disease vs focus on all the negative effects on his life   Person(s) Educated Patient;Spouse   Methods Explanation   Comprehension Verbalized understanding             PT Long Term Goals - 04/09/16 1114      PT LONG TERM GOAL #1   Title Pt will verbalize understanding of HEP for improved balance, transfers, gait for improved functional mobility.  TARGET 04/06/16  GOAL EXTENDED:  New TARGET DATE 05/09/16   Time 4  4 weeks-recert 10/10/00 visit   Period Weeks   Status On-going     PT LONG TERM GOAL #2   Title Pt will perform at least 6 of 10 reps of sit<>stand transfers with  minimal UE support, for improved transfer efficiency and safety.  EXTENDED GOAL:  NEW TARGET DATE:  05/09/16   Baseline Pt needs definite UE support for sit<>stand transfers.   Time 4  4 weeks per recert 02/12/53   Period Weeks   Status On-going     PT LONG TERM GOAL #3   Title Pt will improve TUG score to less than or equal to 16 seconds for decreased fall risk.  GOAL EXTENDED:  NEW TARGET DATE:  05/09/16   Baseline 20.65 sec 04/09/16   Time 4  4 weeks-per recert 12/16/18   Period Weeks   Status Not Met     PT LONG TERM GOAL #4   Title Pt will perform at least 3 minutes of standing with intermittent UE support with supervision, for improved standing balance and performance of ADLs.  GOAL EXTENDED:  NEW TARGET DATE 05/09/16   Baseline Pt stands <10 seconds without UE support prior to LOB posteriorly.   Time 4  4 weeks-per recert 1/0/07   Period Weeks   Status On-going     PT  LONG TERM GOAL #5   Title Pt will verbalize understanding of fall prevention in home environment.  GOAL EXTENDED:  NEW TARGET DATE:  05/09/16   Time 4  4 weeks per recert 09/09/17   Period Weeks   Status On-going     Additional Long Term Goals   Additional Long Term Goals Yes     PT LONG TERM GOAL #6   Title Pt will verbalize understanding of at least 3 means to manage cervical/neck pain with functional activities.  TARGET DATE:  05/09/16   Baseline Pain rated 4/10 bilateral upper traps   Time 4   Period Weeks   Status New               Plan - 04/16/16 1114    Clinical Impression Statement Pt's neck pain if affecting his ability to participate in the HEP; after assesement of neck ; his symptoms are consistent with soft tissue pain due to poor / forward head posture and rounded shoulders;  after positioning him in a fully neurtal and slightly extended posture, and doing STM to cervical muscles his pain resolved;  As with last assessement he will benefit from tx to improve posture then we can furhter advance the balance ex's    PT Next Visit Plan continue with manual therapy as needed  ; cont to adapt HEP so he can be successful and more independent   Consulted and Agree with Plan of Care Patient;Family member/caregiver   Family Member Consulted wife- Horris Latino      Patient will benefit from skilled therapeutic intervention in order to improve the following deficits and impairments:  Abnormal gait, Postural dysfunction, Decreased mobility, Decreased balance, Pain, Improper body mechanics  Visit Diagnosis: Other symptoms and signs involving the nervous system  Abnormal posture  Muscle weakness (generalized)  Other abnormalities of gait and mobility  Unsteadiness on feet     Problem List Patient Active Problem List   Diagnosis Date Noted  . DDD (degenerative disc disease), cervical 04/07/2016  . Neuropathy ([place])   . Bulging discs   . Gastritis   . Parkinsonian syndrome  ([place])   . Hyperlipidemia     Rosaura Carpenter D PT DPT 04/16/2016, 11:19 AM  [facility] [address] [place], [place], [address] Phone: [phone]   Fax:  [phone]  Name: Alex Frazier MRN: [id number] Date  of Birth: 1946/05/16

## 2016-04-18 ENCOUNTER — Ambulatory Visit: Payer: Medicare Other | Admitting: Physical Therapy

## 2016-04-18 VITALS — BP 118/55 | HR 79

## 2016-04-18 DIAGNOSIS — R29818 Other symptoms and signs involving the nervous system: Secondary | ICD-10-CM | POA: Diagnosis not present

## 2016-04-18 DIAGNOSIS — R2681 Unsteadiness on feet: Secondary | ICD-10-CM | POA: Diagnosis not present

## 2016-04-18 DIAGNOSIS — R2689 Other abnormalities of gait and mobility: Secondary | ICD-10-CM

## 2016-04-18 DIAGNOSIS — R293 Abnormal posture: Secondary | ICD-10-CM | POA: Diagnosis not present

## 2016-04-18 DIAGNOSIS — M6281 Muscle weakness (generalized): Secondary | ICD-10-CM

## 2016-04-18 NOTE — Therapy (Signed)
[facility] [address] [place], [place], [address] Phone: [phone]   Fax:  [phone]  Physical Therapy Treatment  Patient Details  Name: Alex Frazier MRN: [id number] Date of Birth: 23-Oct-1945 Referring Provider: Dennard Schaumann  Encounter Date: 04/18/2016      PT End of Session - 04/18/16 1116    PT Start Time 1020   PT Stop Time 1105   PT Time Calculation (min) 45 min   Activity Tolerance Patient tolerated treatment well   Behavior During Therapy Agitated;WFL for tasks assessed/performed      Past Medical History:  Diagnosis Date  . B12 deficiency    Borderline  . Bulging discs   . Gastritis   . Hyperlipidemia   . Neuropathy ([place])   . Parkinsonian syndrome ([place])   . Peripheral neuropathy [facility])     Past Surgical History:  Procedure Laterality Date  . APPENDECTOMY    . BACK SURGERY      Vitals:   04/18/16 1027  BP: (!) 118/55  Pulse: 79        Subjective Assessment - 04/18/16 1111    Subjective Pt stated he felt his nerves were shot after last vist; c/o shoulder pain today as well as neck pain; asked him what he remembered about our discussion about tx last visit and his response was "PD wont kill me"  ;  he tends to be very negative and his wife is frustrated but  dealing with his deficits   Currently in Pain? Yes   Pain Score 5    Pain Location Neck   Pain Orientation Right   Pain Descriptors / Indicators Aching   Pain Type Chronic pain   Pain Onset 1 to 4 weeks ago   Pain Frequency Intermittent   Aggravating Factors  flexed posture ; turning head   Pain Relieving Factors heat, meds   Pain Score 5   Pain Location Shoulder   Pain Orientation Left   Pain Descriptors / Indicators Aching   Pain Type Acute pain   Pain Onset 1 to 4 weeks ago   Pain Frequency Intermittent         Manual therapy  With pt in supine; progressed from sitting position to fully supine using adjustable plinth;   Posterior /anterior cervical glides grade 2-4 and SNAG for neck rotation;  Posterior gleno humeral glide grade 3 to bilateral shoulders;  Gentle manual stretch of pecs and SCM and scalenes                        PT Education - 04/18/16 1059    Education provided Yes   Education Details cont discussion about PD and dealing with his loss of independence and his wife's frustration with care;  recommended cont to work towards the HEP we set up with emphasis on getting neck neutral - get supine - do arm lifts overhead while supine ; progressivley use less pillows until totally neutral;    Person(s) Educated Patient;Spouse   Methods Explanation   Comprehension Verbalized understanding             PT Long Term Goals - 04/09/16 1114      PT LONG TERM GOAL #1   Title Pt will verbalize understanding of HEP for improved balance, transfers, gait for improved functional mobility.  TARGET 04/06/16  GOAL EXTENDED:  New TARGET DATE 05/09/16   Time 4  4 weeks-recert 04/10/28 visit   Period [name]  Status On-going     PT LONG TERM GOAL #2   Title Pt will perform at least 6 of 10 reps of sit<>stand transfers with minimal UE support, for improved transfer efficiency and safety.  EXTENDED GOAL:  NEW TARGET DATE:  05/09/16   Baseline Pt needs definite UE support for sit<>stand transfers.   Time 4  4 weeks per recert 10/17/77   Period Weeks   Status On-going     PT LONG TERM GOAL #3   Title Pt will improve TUG score to less than or equal to 16 seconds for decreased fall risk.  GOAL EXTENDED:  NEW TARGET DATE:  05/09/16   Baseline 20.65 sec 04/09/16   Time 4  4 weeks-per recert 04/16/20   Period Weeks   Status Not Met     PT LONG TERM GOAL #4   Title Pt will perform at least 3 minutes of standing with intermittent UE support with supervision, for improved standing balance and performance of ADLs.  GOAL EXTENDED:  NEW TARGET DATE 05/09/16   Baseline Pt stands <10 seconds without UE support  prior to LOB posteriorly.   Time 4  4 weeks-per recert 09/17/39   Period Weeks   Status On-going     PT LONG TERM GOAL #5   Title Pt will verbalize understanding of fall prevention in home environment.  GOAL EXTENDED:  NEW TARGET DATE:  05/09/16   Time 4  4 weeks per recert 03/12/07   Period Weeks   Status On-going     Additional Long Term Goals   Additional Long Term Goals Yes     PT LONG TERM GOAL #6   Title Pt will verbalize understanding of at least 3 means to manage cervical/neck pain with functional activities.  TARGET DATE:  05/09/16   Baseline Pain rated 4/10 bilateral upper traps   Time 4   Period Weeks   Status New               Plan - May 06, 2016 1117    Clinical Impression Statement Able to get pt in neutral and even a bit extended on the plinth; and able to get PROM of neck to 60 deg bilaterally; limited by pain;  he was albe to be fully supine and raise arms to 170 shoulder flexion;  he is able to achieve the desired posture and ROM with gravity eliminated; and this reduces his neck and shoulder pain; if he can be conisitent with the  aarom we can progress strengthening and get him out of flexed posture in standing;  then we can make some significant progress in reducing fall risk and gait ability    Consulted and Agree with Plan of Care Patient;Family member/caregiver   Family Member Consulted wife - Horris Latino      Patient will benefit from skilled therapeutic intervention in order to improve the following deficits and impairments:     Visit Diagnosis: Abnormal posture  Muscle weakness (generalized)  Other abnormalities of gait and mobility  Unsteadiness on feet       G-Codes - May 06, 2016 1102    Functional Assessment Tool Used TUG 16 sec   Mobility: Walking and Moving Around Current Status (X4481) At least 40 percent but less than 60 percent impaired, limited or restricted   Mobility: Walking and Moving Around Goal Status (E5631) At least 1 percent but less  than 20 percent impaired, limited or restricted      Problem List Patient Active Problem List   Diagnosis  Date Noted  . DDD (degenerative disc disease), cervical 04/07/2016  . Neuropathy ([place])   . Bulging discs   . Gastritis   . Parkinsonian syndrome ([place])   . Hyperlipidemia     Rosaura Carpenter D PT DPT  04/18/2016, 11:22 AM  [facility] [address] [place], [place], [address] Phone: [phone]   Fax:  [phone]  Name: Alex Frazier MRN: [id number] Date of Birth: [birth year]

## 2016-04-22 ENCOUNTER — Ambulatory Visit (INDEPENDENT_AMBULATORY_CARE_PROVIDER_SITE_OTHER): Payer: Medicare Other | Admitting: Family Medicine

## 2016-04-22 VITALS — BP 110/70 | HR 96 | Temp 98.7°F | Resp 16

## 2016-04-22 DIAGNOSIS — M542 Cervicalgia: Secondary | ICD-10-CM

## 2016-04-22 DIAGNOSIS — M25512 Pain in left shoulder: Secondary | ICD-10-CM

## 2016-04-22 DIAGNOSIS — M5030 Other cervical disc degeneration, unspecified cervical region: Secondary | ICD-10-CM | POA: Diagnosis not present

## 2016-04-22 NOTE — Progress Notes (Signed)
Subjective:    Patient ID: Alex Frazier, male    DOB: [birth year], 70 y.o.   MRN: [id number]  HPI 03/14/16 Patient has fallen 3 or 4 times in the last month. Since that time he has had pain in his neck around the level of C3. The pain is located on the lateral aspects bilaterally. He has no tenderness to palpation over the spinous processes. There is no extreme tenderness to palpation over the transverse process. It is more of a muscular soreness. However he does have pain with rotation of the neck in forward flexion of the neck. He denies any neuropathy in his arms or weakness in his arms.  At that time, my plan was: I believe the patient has cervical paraspinal muscle spasms. However I would like to obtain an x-ray of his neck to rule out an occult fracture which I believe is highly unlikely. If x-ray is normal, proceed with a prednisone taper pack for inflammation and diazepam 5 mg every 8 hours as needed for muscle spasms. If not improving by next week, I would recommend physical therapy consultation   04/22/16 Patient tried the Valium and the prednisone with no relief. He returned and saw my partner who recommended physical therapy for his neck. He is seen no benefit with physical therapy. He is still unable to rotate his neck more than 15. He continues to have pain in both sides of his neck around the level of C3-C4. The pain is severe and keeps him from sleeping. However over the last few weeks he is also developed pain in his left shoulder. He has pain with abduction greater than 70. He has a positive Hawkins sign. A positive empty can test. However he also has a positive Spurling sign. He has no numbness or weakness or tingling in his left arm. Past Medical History:  Diagnosis Date  . B12 deficiency    Borderline  . Bulging discs   . Gastritis   . Hyperlipidemia   . Neuropathy ([place])   . Parkinsonian syndrome ([place])   . Peripheral neuropathy [facility])    Past Surgical History:  Procedure  Laterality Date  . APPENDECTOMY    . BACK SURGERY     Current Outpatient Prescriptions on File Prior to Visit  Medication Sig Dispense Refill  . aspirin 325 MG tablet Take 325 mg by mouth daily.      . carbidopa-levodopa (SINEMET IR) 25-100 MG tablet 1 tablet 3 (three) times daily.     . Carbidopa-Levodopa ER (SINEMET CR) 25-100 MG tablet controlled release Take 1 tablet by mouth at bedtime.  2  . diazepam (VALIUM) 5 MG tablet Take 1 tablet (5 mg total) by mouth every 8 (eight) hours as needed for muscle spasms. 30 tablet 1  . DULoxetine (CYMBALTA) 60 MG capsule TAKE 1 BY MOUTH DAILY 90 capsule 3  . Fish Oil-Cholecalciferol (OMEGA-3 FISH OIL/VITAMIN D3) 1000-1000 MG-UNIT CAPS Take by mouth.    . fluticasone (FLONASE) 50 MCG/ACT nasal spray Place 2 sprays into both nostrils daily. 16 g 6  . meloxicam (MOBIC) 7.5 MG tablet Take 1 tablet (7.5 mg total) by mouth daily. 30 tablet 1  . Polyethylene Glycol 3350 (MIRALAX PO) Take by mouth.    . pramipexole (MIRAPEX) 0.125 MG tablet Take 0.125 mg by mouth 3 (three) times daily.    . pravastatin (PRAVACHOL) 20 MG tablet Take 1 tablet (20 mg total) by mouth daily at 6 PM. 90 tablet 3  . saw palmetto 500  MG capsule Take 500 mg by mouth daily.     No current facility-administered medications on file prior to visit.    Allergies  Allergen Reactions  . Indomethacin   . Lyrica [Pregabalin]    Social History   Social History  . Marital status: Married    Spouse name: N/A  . Number of children: N/A  . Years of education: N/A   Occupational History  . Not on file.   Social History Main Topics  . Smoking status: Never Smoker  . Smokeless tobacco: Never Used  . Alcohol use No  . Drug use: No  . Sexual activity: Not Currently   Other Topics Concern  . Not on file   Social History Narrative  . No narrative on file      Review of Systems  All other systems reviewed and are negative.      Objective:   Physical Exam  Cardiovascular:  Normal rate, regular rhythm and normal heart sounds.   Pulmonary/Chest: Effort normal and breath sounds normal.  Musculoskeletal:       Left shoulder: He exhibits decreased range of motion, tenderness, pain and decreased strength. He exhibits no bony tenderness, no crepitus and no spasm.       Cervical back: He exhibits decreased range of motion, tenderness, pain and spasm. He exhibits no bony tenderness.  Vitals reviewed.         Assessment & Plan:  Neck pain - Plan: MR Cervical Spine Wo Contrast  Left shoulder pain  DDD (degenerative disc disease), cervical - Plan: MR Cervical Spine Wo Contrast  Patient is tried anti-inflammatories, muscle relaxers, pain medication, and physical therapy with no relief in his neck. Obtain an MRI of his neck to determine if there is a bulging disc or ruptured disc that may be amenable to intervention such as cortisone injections to help alleviate his pain. I believe the pain in his left shoulder is due to rotator cuff tendinitis. Using sterile technique, I injected his left shoulder with 2 mL's of lidocaine 2 mL of Marcaine, and 2 mL of 40 mg per mL Kenalog.

## 2016-04-23 ENCOUNTER — Ambulatory Visit: Payer: Medicare Other | Admitting: Physical Therapy

## 2016-04-23 ENCOUNTER — Encounter: Payer: Self-pay | Admitting: Family Medicine

## 2016-04-23 DIAGNOSIS — R2689 Other abnormalities of gait and mobility: Secondary | ICD-10-CM | POA: Diagnosis not present

## 2016-04-23 DIAGNOSIS — R29818 Other symptoms and signs involving the nervous system: Secondary | ICD-10-CM | POA: Diagnosis not present

## 2016-04-23 DIAGNOSIS — M6281 Muscle weakness (generalized): Secondary | ICD-10-CM | POA: Diagnosis not present

## 2016-04-23 DIAGNOSIS — R293 Abnormal posture: Secondary | ICD-10-CM

## 2016-04-23 DIAGNOSIS — R2681 Unsteadiness on feet: Secondary | ICD-10-CM

## 2016-04-23 NOTE — Therapy (Signed)
[facility] [address] [place], [place], [address] Phone: [phone]   Fax:  [phone]  Physical Therapy Treatment  Patient Details  Name: Alex Frazier MRN: [id number] Date of Birth: [birth year] Referring Provider: Dennard Schaumann  Encounter Date: 04/23/2016      PT End of Session - 04/23/16 1203    Visit Number 11   Number of Visits 16   Date for PT Re-Evaluation 06/08/16   Authorization Type Medicare/BCBS-GCODE every 10th visit   PT Start Time 0847   PT Stop Time 0932   PT Time Calculation (min) 45 min   Activity Tolerance Patient tolerated treatment well   Behavior During Therapy [facility] for tasks assessed/performed      Past Medical History:  Diagnosis Date  . B12 deficiency    Borderline  . Bulging discs   . Gastritis   . Hyperlipidemia   . Neuropathy ([place])   . Parkinsonian syndrome ([place])   . Peripheral neuropathy [facility])     Past Surgical History:  Procedure Laterality Date  . APPENDECTOMY    . BACK SURGERY      There were no vitals filed for this visit.      Subjective Assessment - 04/23/16 0853    Subjective Pt went to the md and per wife the md wants to hold on neck exercises until after MRI and to focus on LE's and balance.   Patient is accompained by: Family member  wife   Pertinent History Parkinson's disease x 2-3 years   Patient Stated Goals Pt's goal is to improve walking and decrease falls.   Currently in Pain? Yes   Pain Score 3    Pain Location Neck   Pain Orientation Right   Pain Descriptors / Indicators Aching   Pain Type Chronic pain   Pain Onset 1 to 4 weeks ago   Pain Frequency Intermittent   Aggravating Factors  flexed posture, turning head   Pain Relieving Factors heat, meds   Multiple Pain Sites Yes   Pain Score 2   Pain Location Shoulder   Pain Orientation Left   Pain Descriptors / Indicators Aching   Pain Type Acute pain   Pain Onset 1 to 4 weeks ago   Pain  Frequency Intermittent   Aggravating Factors  overhead movement   Pain Relieving Factors injection helped              [facility] Adult PT Treatment/Exercise - 04/23/16 0001      Transfers   Transfers Sit to Stand;Stand to Sit   Sit to Stand With upper extremity assist;From bed;5: Supervision   Sit to Stand Details (indicate cue type and reason) cues for technique   Stand to Sit 5: Supervision   Number of Reps 10 reps   Comments Pt tends to use backs of legs to push up from mat.  Needs frequent cues for proper technique.     Ambulation/Gait   Ambulation/Gait Yes   Ambulation/Gait Assistance 5: Supervision   Ambulation Distance (Feet) 330 Feet  plus 120' x 2   Assistive device Rollator   Gait Pattern Step-through pattern;Decreased step length - right;Decreased step length - left;Decreased dorsiflexion - right;Decreased dorsiflexion - left;Scissoring;Trunk flexed;Narrow base of support;Poor foot clearance - left;Poor foot clearance - right   Ambulation Surface Level;Indoor   Gait Comments Cues for posture, decreased UE reliance on Rollator and keeping rollator close to pt;Discussed how technique with gait is contributing to poor posture as well as neck and  shoulder pain     Posture/Postural Control   Posture/Postural Control Postural limitations   Postural Limitations Rounded Shoulders;Forward head;Posterior pelvic tilt     Self-Care   Self-Care Other Self-Care Comments   Other Self-Care Comments  Discussed importance of taking medication on time and consistently as well as taking Sinemet 30 minutes before a meal or 60-90 minutes after for optimal absorption.  Provided booklet from [facility] on Nutrition and on Medication.  Wife reports she has been giving pt his meds with milk.  Discussed taking meds with non-protein food if needed for upset stomach.  Continue to reinforce importance of mobility and strengthening outside of PT sessions and how 2 PT sessions/week will not allow for progress  alone.  Wife appears frustrated with pts lack of mobility and continues to encourage pt.     Knee/Hip Exercises: Seated   Long Arc Quad Both;2 sets;10 reps;Weights   Long Arc Quad Weight 4 lbs.   Long [employer] Limitations pt tends to shrug shoulders and tense up with LE exercises as well as gait.  Discussed relaxing UE's when performing LE exercises.   Marching Both;2 sets;10 reps;Weights   Marching Weights 4 lbs.                PT Education - 04/23/16 1159    Education provided Yes   Education Details Marked OTAGO exercises for pt to continue to perform (not doing neck exercises per MD until MRI), importance of mobility and strengthening at home in addition to therapy, importance of posture and neck pain including shoulder hiking during gait with Rollator   Person(s) Educated Patient;Spouse   Methods Explanation;Demonstration;Verbal cues   Comprehension Verbalized understanding             PT Long Term Goals - 04/09/16 1114      PT LONG TERM GOAL #1   Title Pt will verbalize understanding of HEP for improved balance, transfers, gait for improved functional mobility.  TARGET 04/06/16  GOAL EXTENDED:  New TARGET DATE 05/09/16   Time 4  4 weeks-recert 04/10/28 visit   Period Weeks   Status On-going     PT LONG TERM GOAL #2   Title Pt will perform at least 6 of 10 reps of sit<>stand transfers with minimal UE support, for improved transfer efficiency and safety.  EXTENDED GOAL:  NEW TARGET DATE:  05/09/16   Baseline Pt needs definite UE support for sit<>stand transfers.   Time 4  4 weeks per recert 09/16/14   Period Weeks   Status On-going     PT LONG TERM GOAL #3   Title Pt will improve TUG score to less than or equal to 16 seconds for decreased fall risk.  GOAL EXTENDED:  NEW TARGET DATE:  05/09/16   Baseline 20.65 sec 04/09/16   Time 4  4 weeks-per recert 6/0/60   Period Weeks   Status Not Met     PT LONG TERM GOAL #4   Title Pt will perform at least 3 minutes of standing  with intermittent UE support with supervision, for improved standing balance and performance of ADLs.  GOAL EXTENDED:  NEW TARGET DATE 05/09/16   Baseline Pt stands <10 seconds without UE support prior to LOB posteriorly.   Time 4  4 weeks-per recert 0/4/59   Period Weeks   Status On-going     PT LONG TERM GOAL #5   Title Pt will verbalize understanding of fall prevention in home environment.  GOAL EXTENDED:  NEW  TARGET DATE:  05/09/16   Time 4  4 weeks per recert 09/15/47   Period Weeks   Status On-going     Additional Long Term Goals   Additional Long Term Goals Yes     PT LONG TERM GOAL #6   Title Pt will verbalize understanding of at least 3 means to manage cervical/neck pain with functional activities.  TARGET DATE:  05/09/16   Baseline Pain rated 4/10 bilateral upper traps   Time 4   Period Weeks   Status New               Plan - 04/23/16 1203    Clinical Impression Statement Pt report shoulder pain improved after injection but still having neck pain.  MD has ordered MRI and wants to avoid neck exercises until after MRI.  Pt continues to have decreased mobility and not performing LE strengthening or balance exercises at home.  Wife continues to encourage without success.  Continue PT per POC.   Rehab Potential Good   PT Frequency 2x / week   PT Duration 4 weeks   PT Treatment/Interventions ADLs/Self Care Home Management;Therapeutic exercise;Therapeutic activities;Functional mobility training;Gait training;Neuromuscular re-education;Balance training;Patient/family education;Moist Heat;Ultrasound;Electrical Stimulation;Iontophoresis 96m/ml Dexamethasone;Manual techniques;Passive range of motion   PT Next Visit Plan follow up on if MRI scheduled for cervical spine, continue LE strengthening, gait and endurance   Consulted and Agree with Plan of Care Patient;Family member/caregiver   Family Member Consulted wife - BHorris Latino     Patient will benefit from skilled therapeutic  intervention in order to improve the following deficits and impairments:  Abnormal gait, Postural dysfunction, Decreased mobility, Decreased balance, Pain, Improper body mechanics  Visit Diagnosis: Abnormal posture  Muscle weakness (generalized)  Other abnormalities of gait and mobility  Unsteadiness on feet  Other symptoms and signs involving the nervous system     Problem List Patient Active Problem List   Diagnosis Date Noted  . DDD (degenerative disc disease), cervical 04/07/2016  . Neuropathy ([place]   . Bulging discs   . Gastritis   . Parkinsonian syndrome ([place]   . Hyperlipidemia     RNarda Bonds8/16/2017, 12:19 PM  [place] [address]Phone: [phone]  Fax:  [phone] Name: Alex BRADBURNMRN: [id number] of Birth: [birth year]  DNarda Bonds PDelawareCKildeer08/16/17 12:20 PM Phone: [phone]: [phone]

## 2016-04-24 ENCOUNTER — Ambulatory Visit: Payer: Medicare Other | Admitting: Family Medicine

## 2016-04-25 ENCOUNTER — Ambulatory Visit: Payer: Medicare Other | Admitting: Family Medicine

## 2016-04-25 ENCOUNTER — Ambulatory Visit: Payer: Medicare Other | Admitting: Physical Therapy

## 2016-04-25 ENCOUNTER — Telehealth: Payer: Self-pay | Admitting: Family Medicine

## 2016-04-25 DIAGNOSIS — R2681 Unsteadiness on feet: Secondary | ICD-10-CM

## 2016-04-25 DIAGNOSIS — R2689 Other abnormalities of gait and mobility: Secondary | ICD-10-CM

## 2016-04-25 DIAGNOSIS — M6281 Muscle weakness (generalized): Secondary | ICD-10-CM | POA: Diagnosis not present

## 2016-04-25 DIAGNOSIS — R293 Abnormal posture: Secondary | ICD-10-CM

## 2016-04-25 DIAGNOSIS — R29818 Other symptoms and signs involving the nervous system: Secondary | ICD-10-CM

## 2016-04-25 NOTE — Therapy (Signed)
[facility] [address] [place], [place], [address] Phone: [phone]   Fax:  [phone]  Physical Therapy Treatment  Patient Details  Name: Alex Frazier MRN: [id number] Date of Birth: 11-03-1945 Referring Provider: Dennard Schaumann  Encounter Date: 04/25/2016      PT End of Session - 04/25/16 1120    Visit Number 12   Number of Visits 16   PT Start Time 0786   PT Stop Time 1100   PT Time Calculation (min) 45 min   Activity Tolerance Patient tolerated treatment well      Past Medical History:  Diagnosis Date  . B12 deficiency    Borderline  . Bulging discs   . Gastritis   . Hyperlipidemia   . Neuropathy ([place])   . Parkinsonian syndrome ([place])   . Peripheral neuropathy [facility])     Past Surgical History:  Procedure Laterality Date  . APPENDECTOMY    . BACK SURGERY      There were no vitals filed for this visit.      Subjective Assessment - 04/25/16 1110    Subjective Pt is having good relief from the shoulder injection; he's in a much more positive mood today; wife stated they have been doing the three leg ex's for HEP; and willing to keep working on it;  the MRI for the neck has not been scheduled as of yet;     Patient is accompained by: Family member   Currently in Pain? Yes   Pain Score 3    Pain Location Neck   Pain Descriptors / Indicators Aching   Pain Type Neuropathic pain   Pain Frequency Intermittent   Multiple Pain Sites Yes   Pain Score 1   Pain Location Shoulder   Pain Orientation Left   Pain Descriptors / Indicators Sore   Pain Type Acute pain   Pain Relieving Factors injection helped; arom did not cause problems today                          [facility] Adult PT Treatment/Exercise - 04/25/16 0001      Transfers   Transfers Sit to Stand;Supine to Sit;Floor to Transfer   Sit to Stand 5: Supervision  5 reps    Supine to Sit 5: Supervision  verbal ques for body mechanics  and technique   Floor to Transfer 4: Min guard  1 rep     Exercises   Exercises Shoulder  t-bar AROM in supine 5 min                PT Education - 04/25/16 1118    Education provided Yes   Education Details problem solved best technique to get in/out of bed; recommneded doing this as an exericse by repeating the transfer 5+ times; also did a floor transfer and with supervision this could be part of the daily exercise routine   Person(s) Educated Spouse;Patient   Methods Explanation;Demonstration   Comprehension Verbalized understanding;Returned demonstration             PT Long Term Goals - 04/09/16 1114      PT LONG TERM GOAL #1   Title Pt will verbalize understanding of HEP for improved balance, transfers, gait for improved functional mobility.  TARGET 04/06/16  GOAL EXTENDED:  New TARGET DATE 05/09/16   Time 4  4 weeks-recert 03/13/43 visit   Period Weeks   Status On-going     PT LONG  TERM GOAL #2   Title Pt will perform at least 6 of 10 reps of sit<>stand transfers with minimal UE support, for improved transfer efficiency and safety.  EXTENDED GOAL:  NEW TARGET DATE:  05/09/16   Baseline Pt needs definite UE support for sit<>stand transfers.   Time 4  4 weeks per recert 04/09/16   Period Weeks   Status On-going     PT LONG TERM GOAL #3   Title Pt will improve TUG score to less than or equal to 16 seconds for decreased fall risk.  GOAL EXTENDED:  NEW TARGET DATE:  05/09/16   Baseline 20.65 sec 04/09/16   Time 4  4 weeks-per recert 04/09/16   Period Weeks   Status Not Met     PT LONG TERM GOAL #4   Title Pt will perform at least 3 minutes of standing with intermittent UE support with supervision, for improved standing balance and performance of ADLs.  GOAL EXTENDED:  NEW TARGET DATE 05/09/16   Baseline Pt stands <10 seconds without UE support prior to LOB posteriorly.   Time 4  4 weeks-per recert 04/09/16   Period Weeks   Status On-going     PT LONG TERM GOAL #5    Title Pt will verbalize understanding of fall prevention in home environment.  GOAL EXTENDED:  NEW TARGET DATE:  05/09/16   Time 4  4 weeks per recert 04/09/16   Period Weeks   Status On-going     Additional Long Term Goals   Additional Long Term Goals Yes     PT LONG TERM GOAL #6   Title Pt will verbalize understanding of at least 3 means to manage cervical/neck pain with functional activities.  TARGET DATE:  05/09/16   Baseline Pain rated 4/10 bilateral upper traps   Time 4   Period Weeks   Status New               Plan - 04/25/16 1120    Clinical Impression Statement Shoulder pain is improving  able to get near full ROM today;  pt needs a lot of support emotionally; his wife is great in helping him to 'do the right things' but he does struggle with his loss of function and difficulties of losing some of his indpendence;  he responded well to the ex's today; no c/o increase in pain    Rehab Potential Good   PT Next Visit Plan cont to work on functional ex's  ;  ie. floor transfers and quadruped ex's   Consulted and Agree with Plan of Care Patient;Family member/caregiver      Patient will benefit from skilled therapeutic intervention in order to improve the following deficits and impairments:  Abnormal gait, Decreased coordination, Decreased mobility, Decreased activity tolerance, Difficulty walking, Hypomobility, Improper body mechanics, Pain  Visit Diagnosis: Abnormal posture  Muscle weakness (generalized)  Other abnormalities of gait and mobility  Unsteadiness on feet  Other symptoms and signs involving the nervous system     Problem List Patient Active Problem List   Diagnosis Date Noted  . DDD (degenerative disc disease), cervical 04/07/2016  . Neuropathy ([facility])   . Bulging discs   . Gastritis   . Parkinsonian syndrome ([facility])   . Hyperlipidemia     Vashti Hey D PT DPT 04/25/2016, 11:24 AM  [facility] [address] [place], [place], [address] Phone: [phone]   Fax:  [phone]  Name: GIBRIL MASTRO MRN: [id number]  Date of Birth: 08/18/1946

## 2016-04-25 NOTE — Telephone Encounter (Signed)
Called the Neuro rehab center and told them to discontinue neck therapy but continue with other modalities.  They want [place] to fax an order from PCP, will do.

## 2016-04-25 NOTE — Telephone Encounter (Signed)
-----   Message from Susy Frizzle, MD sent at 04/22/2016 12:04 PM EDT ----- Please send in order to physical therapy to discontinue the physical therapy on his neck for continued physical therapy for balance exercises related to his Parkinson's disease

## 2016-04-29 ENCOUNTER — Ambulatory Visit: Payer: Medicare Other | Admitting: Physical Therapy

## 2016-04-29 ENCOUNTER — Encounter: Payer: Self-pay | Admitting: Physical Therapy

## 2016-04-29 DIAGNOSIS — R2689 Other abnormalities of gait and mobility: Secondary | ICD-10-CM | POA: Diagnosis not present

## 2016-04-29 DIAGNOSIS — M6281 Muscle weakness (generalized): Secondary | ICD-10-CM | POA: Diagnosis not present

## 2016-04-29 DIAGNOSIS — R293 Abnormal posture: Secondary | ICD-10-CM | POA: Diagnosis not present

## 2016-04-29 DIAGNOSIS — R29818 Other symptoms and signs involving the nervous system: Secondary | ICD-10-CM

## 2016-04-29 DIAGNOSIS — R2681 Unsteadiness on feet: Secondary | ICD-10-CM

## 2016-04-29 NOTE — Therapy (Signed)
[facility] [address] [place], [place], [address] Phone: [phone]   Fax:  [phone]  Physical Therapy Treatment  Patient Details  Name: Alex Frazier MRN: [id number] Date of Birth: August 27, 1946 Referring Provider: Dennard Schaumann  Encounter Date: 04/29/2016      PT End of Session - 04/29/16 1216    Visit Number 13   Number of Visits 16   Date for PT Re-Evaluation 06/08/16   Authorization Type Medicare/BCBS-GCODE every 10th visit   PT Start Time 0846   PT Stop Time 0930   PT Time Calculation (min) 44 min   Equipment Utilized During Treatment Gait belt   Activity Tolerance Patient tolerated treatment well   Behavior During Therapy [facility] for tasks assessed/performed      Past Medical History:  Diagnosis Date  . B12 deficiency    Borderline  . Bulging discs   . Gastritis   . Hyperlipidemia   . Neuropathy ([place])   . Parkinsonian syndrome ([place])   . Peripheral neuropathy [facility]     Past Surgical History:  Procedure Laterality Date  . APPENDECTOMY    . BACK SURGERY      There were no vitals filed for this visit.      Subjective Assessment - 04/29/16 0851    Subjective (P)  No falls or issues. Doing some exercises.    Patient is accompained by: (P)  Family member   Pertinent History (P)  Parkinson's disease x 2-3 years   Patient Stated Goals (P)  Pt's goal is to improve walking and decrease falls.   Currently in Pain? (P)  Yes   Pain Score (P)  3   in last week, worst 5/10, best 2/10   Pain Location (P)  Neck   Pain Orientation (P)  Right   Pain Descriptors / Indicators (P)  Aching   Pain Type (P)  Neuropathic pain   Pain Onset (P)  1 to 4 weeks ago   Pain Frequency (P)  Constant   Aggravating Factors  (P)  flexed posture, turning head   Pain Relieving Factors (P)  heating pad, meds      Therapeutic Activities:  PT demo, instructed in technique for sit to/from stand from chairs with armrest using UEs  with tactile & verbal cues. Patient requires stabilization on walker or person upon arising to stop anterior balance loss. Progressed to stand from 24" stool with his UEs on PT shoulders to facilitate increased LE use.  Sitting balance on bar stool without back support or UE support with feet supported on ground. Trunk flexion (hands to front of his knees) to upright with BUE scapular retraction / extension 10 reps with verbal, visual & tactile cues.   Neuromuscular ON:[id number]: Balance activities in parallel bars for safety with light BUE support progressed to intermittent with mirror for visual feedback, manual, verbal cues from PT.  Standing crossways on 2" beam: static feet part eyes open, weight shifts from pelvis ant/post & right/left, head turns right/left & up/down and stepping forward on/off alternating LEs with 3-5 sec stabilization and stepping backward on/off alternating LEs with 3-5sec stabilization.   Gait Training with rollator walker:  PT instructed in safe use of wheel locks, proper hand position to engage quicker, turning around obstacles & upright posture. Patient ambulated 200' X 2 around furniture with tactile, verbal cues.  PT demo, instructed wife in using vision to things in front of patient & tactile cues upper back to facilitate upright posture. She  verbalized understanding.                                  PT Long Term Goals - 04/09/16 1114      PT LONG TERM GOAL #1   Title Pt will verbalize understanding of HEP for improved balance, transfers, gait for improved functional mobility.  TARGET 04/06/16  GOAL EXTENDED:  New TARGET DATE 05/09/16   Time 4  4 weeks-recert 04/09/16 visit   Period Weeks   Status On-going     PT LONG TERM GOAL #2   Title Pt will perform at least 6 of 10 reps of sit<>stand transfers with minimal UE support, for improved transfer efficiency and safety.  EXTENDED GOAL:  NEW TARGET DATE:  05/09/16   Baseline Pt needs definite  UE support for sit<>stand transfers.   Time 4  4 weeks per recert 04/09/16   Period Weeks   Status On-going     PT LONG TERM GOAL #3   Title Pt will improve TUG score to less than or equal to 16 seconds for decreased fall risk.  GOAL EXTENDED:  NEW TARGET DATE:  05/09/16   Baseline 20.65 sec 04/09/16   Time 4  4 weeks-per recert 04/09/16   Period Weeks   Status Not Met     PT LONG TERM GOAL #4   Title Pt will perform at least 3 minutes of standing with intermittent UE support with supervision, for improved standing balance and performance of ADLs.  GOAL EXTENDED:  NEW TARGET DATE 05/09/16   Baseline Pt stands <10 seconds without UE support prior to LOB posteriorly.   Time 4  4 weeks-per recert 04/09/16   Period Weeks   Status On-going     PT LONG TERM GOAL #5   Title Pt will verbalize understanding of fall prevention in home environment.  GOAL EXTENDED:  NEW TARGET DATE:  05/09/16   Time 4  4 weeks per recert 04/09/16   Period Weeks   Status On-going     Additional Long Term Goals   Additional Long Term Goals Yes     PT LONG TERM GOAL #6   Title Pt will verbalize understanding of at least 3 means to manage cervical/neck pain with functional activities.  TARGET DATE:  05/09/16   Baseline Pain rated 4/10 bilateral upper traps   Time 4   Period Weeks   Status New               Plan - 04/29/16 1218    Clinical Impression Statement Patient improved posture with gait with tactile & verbal cues. Pt's wife seems to understand how to facilitate upright posture with gait. Patient has difficulty using hip or ankle strategy for balance recovery.    Rehab Potential Good   PT Frequency 2x / week   PT Duration 4 weeks   PT Treatment/Interventions ADLs/Self Care Home Management;Therapeutic exercise;Therapeutic activities;Functional mobility training;Gait training;Neuromuscular re-education;Balance training;Patient/family education;Moist Heat;Ultrasound;Electrical Stimulation;Iontophoresis 4mg/ml  Dexamethasone;Manual techniques;Passive range of motion   PT Next Visit Plan cont to work on functional ex's  ;  ie. floor transfers and quadruped ex's   Consulted and Agree with Plan of Care Patient;Family member/caregiver   Family Member Consulted wife - [name]      Patient will benefit from skilled therapeutic intervention in order to improve the following deficits and impairments:  Abnormal gait, Decreased coordination, Decreased mobility, Decreased activity tolerance, Difficulty walking, Hypomobility,   Improper body mechanics, Pain  Visit Diagnosis: Abnormal posture  Muscle weakness (generalized)  Other abnormalities of gait and mobility  Unsteadiness on feet  Other symptoms and signs involving the nervous system     Problem List Patient Active Problem List   Diagnosis Date Noted  . DDD (degenerative disc disease), cervical 04/07/2016  . Neuropathy ([facility])   . Bulging discs   . Gastritis   . Parkinsonian syndrome ([facility])   . Hyperlipidemia     , PT, DPT 04/29/2016, 12:23 PM  [facility] [address] [place], [place], [address] Phone: [phone]   Fax:  [phone]  Name: Alex Frazier MRN: [id number] Date of Birth: 06/26/1946    

## 2016-04-30 ENCOUNTER — Ambulatory Visit: Payer: Medicare Other | Admitting: Physical Therapy

## 2016-04-30 DIAGNOSIS — R2689 Other abnormalities of gait and mobility: Secondary | ICD-10-CM

## 2016-04-30 DIAGNOSIS — R29818 Other symptoms and signs involving the nervous system: Secondary | ICD-10-CM | POA: Diagnosis not present

## 2016-04-30 DIAGNOSIS — R293 Abnormal posture: Secondary | ICD-10-CM

## 2016-04-30 DIAGNOSIS — M6281 Muscle weakness (generalized): Secondary | ICD-10-CM

## 2016-04-30 DIAGNOSIS — R2681 Unsteadiness on feet: Secondary | ICD-10-CM | POA: Diagnosis not present

## 2016-05-01 ENCOUNTER — Other Ambulatory Visit: Payer: Self-pay | Admitting: Family Medicine

## 2016-05-01 ENCOUNTER — Ambulatory Visit
Admission: RE | Admit: 2016-05-01 | Discharge: 2016-05-01 | Disposition: A | Discharge status: Home / Self Care | Payer: Medicare Other | Source: Ambulatory Visit | Attending: Family Medicine | Admitting: Family Medicine

## 2016-05-01 DIAGNOSIS — M50221 Other cervical disc displacement at C4-C5 level: Secondary | ICD-10-CM | POA: Diagnosis not present

## 2016-05-01 DIAGNOSIS — M542 Cervicalgia: Secondary | ICD-10-CM

## 2016-05-01 DIAGNOSIS — M6281 Muscle weakness (generalized): Secondary | ICD-10-CM

## 2016-05-01 DIAGNOSIS — R2681 Unsteadiness on feet: Secondary | ICD-10-CM

## 2016-05-01 DIAGNOSIS — M5030 Other cervical disc degeneration, unspecified cervical region: Secondary | ICD-10-CM

## 2016-05-01 DIAGNOSIS — M50222 Other cervical disc displacement at C5-C6 level: Secondary | ICD-10-CM | POA: Diagnosis not present

## 2016-05-01 NOTE — Therapy (Signed)
[facility] [address] [place], [place], [address] Phone: [phone]   Fax:  [phone]  Physical Therapy Treatment  Patient Details  Name: Alex Frazier MRN: [id number] Date of Birth: 07-07-1946 Referring Provider: Dennard Schaumann  Encounter Date: 04/30/2016      PT End of Session - 04/30/16 1313    Visit Number 14   Number of Visits 16   Date for PT Re-Evaluation 06/08/16   Authorization Type Medicare/BCBS-GCODE every 10th visit   PT Start Time 1103   PT Stop Time 1147   PT Time Calculation (min) 44 min   Equipment Utilized During Treatment Gait belt   Activity Tolerance Patient tolerated treatment well   Behavior During Therapy [facility] for tasks assessed/performed      Past Medical History:  Diagnosis Date  . B12 deficiency    Borderline  . Bulging discs   . Gastritis   . Hyperlipidemia   . Neuropathy ([place])   . Parkinsonian syndrome ([place])   . Peripheral neuropathy [facility])     Past Surgical History:  Procedure Laterality Date  . APPENDECTOMY    . BACK SURGERY      There were no vitals filed for this visit.      Subjective Assessment - 04/30/16 1309    Subjective Pt reports still having neck pain.  Is scheduled for MRI tomorrow evening.  Wife continues to report decreased motivation and activity at home.   Patient is accompained by: Family member   Pertinent History Parkinson's disease x 2-3 years   Patient Stated Goals Pt's goal is to improve walking and decrease falls.   Currently in Pain? Yes   Pain Score 4    Pain Location Neck   Pain Orientation Right   Pain Descriptors / Indicators Aching   Pain Type Acute pain   Pain Onset More than a month ago   Pain Frequency Intermittent   Aggravating Factors  turning head   Pain Relieving Factors heat, meds   Multiple Pain Sites No     Sit<>stand from mat x 10 reps working on technique and decreasing posterior lean and decreasing UE assist used In  parallel bars for forward and backward walking with bil UE support trying to decrease amount of UE weight bearing.  Pt unable to ambulate without bil UE or else needs min assist to balance when holding with 1 UE.  Pt with LOB with backward walking and needing cues to maintain forward head and trunk and to increase step size.  Tends to lean posterior with upper body.  In parallel bars working on hip and ankle strategy performing hip bumps to bar behind pt and coming forward to upright working on control and not going past midline.  Pt with poor abdominal initiation and would actually "fall" onto front parallel bar to keep from having to use core to control midline.  Performed alternating LE hip abduction, marching and extension x 10 with bil UE support.  Pt with difficulty with SLS on the R hip.  Scifit level 2.0 all 4 extremities x 10 minutes working on increasing rpm> in 30 seconds bouts.  Discussed increasing the resistance on pts foot pedaler at home as pt reports its too easy.            PT Education - 04/30/16 1311    Education provided Yes   Education Details Not performing previous HEP's that involve the neck and only focusing on LE's (per MD order), importance of mobility  on strength and endurance, over activation of whole body vs isolated muscle movement   Person(s) Educated Patient;Spouse   Methods Explanation;Demonstration;Verbal cues   Comprehension Verbalized understanding             PT Long Term Goals - 04/09/16 1114      PT LONG TERM GOAL #1   Title Pt will verbalize understanding of HEP for improved balance, transfers, gait for improved functional mobility.  TARGET 04/06/16  GOAL EXTENDED:  New TARGET DATE 05/09/16   Time 4  4 weeks-recert 10/13/07 visit   Period Weeks   Status On-going     PT LONG TERM GOAL #2   Title Pt will perform at least 6 of 10 reps of sit<>stand transfers with minimal UE support, for improved transfer efficiency and safety.  EXTENDED GOAL:  NEW  TARGET DATE:  05/09/16   Baseline Pt needs definite UE support for sit<>stand transfers.   Time 4  4 weeks per recert 03/10/52   Period Weeks   Status On-going     PT LONG TERM GOAL #3   Title Pt will improve TUG score to less than or equal to 16 seconds for decreased fall risk.  GOAL EXTENDED:  NEW TARGET DATE:  05/09/16   Baseline 20.65 sec 04/09/16   Time 4  4 weeks-per recert 10/17/90   Period Weeks   Status Not Met     PT LONG TERM GOAL #4   Title Pt will perform at least 3 minutes of standing with intermittent UE support with supervision, for improved standing balance and performance of ADLs.  GOAL EXTENDED:  NEW TARGET DATE 05/09/16   Baseline Pt stands <10 seconds without UE support prior to LOB posteriorly.   Time 4  4 weeks-per recert 12/09/66   Period Weeks   Status On-going     PT LONG TERM GOAL #5   Title Pt will verbalize understanding of fall prevention in home environment.  GOAL EXTENDED:  NEW TARGET DATE:  05/09/16   Time 4  4 weeks per recert 11/09/17   Period Weeks   Status On-going     Additional Long Term Goals   Additional Long Term Goals Yes     PT LONG TERM GOAL #6   Title Pt will verbalize understanding of at least 3 means to manage cervical/neck pain with functional activities.  TARGET DATE:  05/09/16   Baseline Pain rated 4/10 bilateral upper traps   Time 4   Period Weeks   Status New               Plan - 04/30/16 1313    Clinical Impression Statement Pt continues with c/o neck pain.  Overcompensates when trying to isolate muscle movement by using "whole body" and tenses up.  Can get pt to isolate with cues.  Continues with difficulty with SLS and poor hip and ankle strategies.  Continue PT per POC.   Rehab Potential Good   PT Frequency 2x / week   PT Duration 4 weeks   PT Treatment/Interventions ADLs/Self Care Home Management;Therapeutic exercise;Therapeutic activities;Functional mobility training;Gait training;Neuromuscular re-education;Balance  training;Patient/family education;Moist Heat;Ultrasound;Electrical Stimulation;Iontophoresis 92m/ml Dexamethasone;Manual techniques;Passive range of motion   PT Next Visit Plan Follow up on MRI results;Follow up on order from Dr PDennard Schaumannfor [facility]McNary(requested 04/30/16);Continue LE strengthening   Consulted and Agree with Plan of Care Patient;Family member/caregiver   Family Member Consulted wife - BHorris Latino     Patient will benefit from skilled therapeutic intervention in order to  improve the following deficits and impairments:  Abnormal gait, Decreased coordination, Decreased mobility, Decreased activity tolerance, Difficulty walking, Hypomobility, Improper body mechanics, Pain  Visit Diagnosis: Other abnormalities of gait and mobility  Muscle weakness (generalized)  Other symptoms and signs involving the nervous system  Unsteadiness on feet  Abnormal posture     Problem List Patient Active Problem List   Diagnosis Date Noted  . DDD (degenerative disc disease), cervical 04/07/2016  . Neuropathy ([place])   . Bulging discs   . Gastritis   . Parkinsonian syndrome ([place])   . Hyperlipidemia     Narda Bonds 05/01/2016, 12:22 PM  [facility] [address] [place], [place], [address] Phone: [phone]   Fax:  [phone]  Name: MYKAI WENDORF MRN: [id number] Date of Birth: [birth year]   Narda Bonds, [place] 05/01/16 12:22 PM Phone: [phone] Fax: [phone]

## 2016-05-06 ENCOUNTER — Ambulatory Visit: Payer: Medicare Other | Admitting: Physical Therapy

## 2016-05-06 ENCOUNTER — Other Ambulatory Visit: Payer: Self-pay | Admitting: *Deleted

## 2016-05-06 DIAGNOSIS — R29818 Other symptoms and signs involving the nervous system: Secondary | ICD-10-CM

## 2016-05-06 DIAGNOSIS — M6281 Muscle weakness (generalized): Secondary | ICD-10-CM | POA: Diagnosis not present

## 2016-05-06 DIAGNOSIS — M542 Cervicalgia: Secondary | ICD-10-CM

## 2016-05-06 DIAGNOSIS — R2689 Other abnormalities of gait and mobility: Secondary | ICD-10-CM | POA: Diagnosis not present

## 2016-05-06 DIAGNOSIS — R2681 Unsteadiness on feet: Secondary | ICD-10-CM | POA: Diagnosis not present

## 2016-05-06 DIAGNOSIS — M25512 Pain in left shoulder: Secondary | ICD-10-CM

## 2016-05-06 DIAGNOSIS — R293 Abnormal posture: Secondary | ICD-10-CM

## 2016-05-06 DIAGNOSIS — M5030 Other cervical disc degeneration, unspecified cervical region: Secondary | ICD-10-CM

## 2016-05-06 NOTE — Patient Instructions (Signed)
FLEXION: Sitting - Resistance Band (Active)    Sit, both feet flat. Against yellow resistance band, lift right knee toward ceiling. Complete 10 repetitions. Perform 1 session per day.  http://gtsc.exer.us/20   Copyright  VHI. All rights reserved.

## 2016-05-06 NOTE — Therapy (Signed)
[facility] [address] [place], [place], [address] Phone: [phone]   Fax:  [phone]  Physical Therapy Treatment  Patient Details  Name: Alex Frazier MRN: [id number] Date of Birth: [birth year] Referring Provider: Dennard Schaumann  Encounter Date: 05/06/2016      PT End of Session - 05/06/16 1313    Visit Number 15   Number of Visits 16   Date for PT Re-Evaluation 06/08/16   Authorization Type Medicare/BCBS-GCODE every 10th visit   PT Start Time 0933   PT Stop Time 1018   PT Time Calculation (min) 45 min   Activity Tolerance Patient tolerated treatment well   Behavior During Therapy [facility] for tasks assessed/performed      Past Medical History:  Diagnosis Date  . B12 deficiency    Borderline  . Bulging discs   . Gastritis   . Hyperlipidemia   . Neuropathy ([place])   . Parkinsonian syndrome ([place])   . Peripheral neuropathy [facility])     Past Surgical History:  Procedure Laterality Date  . APPENDECTOMY    . BACK SURGERY      There were no vitals filed for this visit.      Subjective Assessment - 05/06/16 0940    Subjective Pt reports having a fall this weekend while reaching to turn light off.  Was using his cane instead of Rollator.  Denies injury.   Patient is accompained by: Family member  wife   Pertinent History Parkinson's disease x 2-3 years   Patient Stated Goals Pt's goal is to improve walking and decrease falls.   Currently in Pain? Yes   Pain Score 4    Pain Location Neck   Pain Orientation Right;Left   Pain Descriptors / Indicators Aching   Pain Type Acute pain   Pain Onset More than a month ago   Pain Frequency Intermittent   Aggravating Factors  turning head   Pain Relieving Factors heat, not turning head   Multiple Pain Sites No     Reviewed exercises that have been provided to pt since initiation of PT.   Performed seated LAQ bil with yellow theraband x 10, seated hip flexion bil with  yellow theraband x 10, standing hamstring curl bil with yellow theraband x 10, standing hip abduction with yellow theraband x 10, standing trunk rotation x 10 at Rollator, squats x 10 at Rollator with cues for proper technique. Performed sit<>stand x 10 for strengthening exercise.  Needs cues to prevent posterior lean against mat and for equal weight bearing.  Verbally reviewed side stepping at counter and seated ankle pumps   Performed seated anterior<>posterior pelvic tilts x 15 with minimal assist by PTA with sheet around low back to initiate tilt.  Seated scapular retraction x 10 with tactile cues for technique.  Self-care:See education for details      PT Education - 05/06/16 1310    Education provided Yes   Education Details Reviewed and revised HEP (removed neck exercises provided initially), importance of compliance with exercise program, posture implications on neck and shoulder pain, finish checking goals next session and PT to discuss discharge, provided order for Rollator to pt/wife from Dr Gregor Hams) Educated Patient;Spouse   Methods Explanation;Demonstration;Tactile cues;Verbal cues;Handout   Comprehension Verbalized understanding;Other (comment)  needs continued encouragement             PT Long Term Goals - 05/06/16 1312      PT LONG TERM GOAL #1   Title  Pt will verbalize understanding of HEP for improved balance, transfers, gait for improved functional mobility.  TARGET 04/06/16  GOAL EXTENDED:  New TARGET DATE 05/09/16   Baseline Revised and reviewed HEP on 05/06/16.  Pt not consistently performing HEP at home.   Time 4  4 weeks-recert 01/10/08 visit   Period Weeks   Status Partially Met     PT LONG TERM GOAL #2   Title Pt will perform at least 6 of 10 reps of sit<>stand transfers with minimal UE support, for improved transfer efficiency and safety.  EXTENDED GOAL:  NEW TARGET DATE:  05/09/16   Baseline Pt needs definite UE support for sit<>stand transfers.    Time 4  4 weeks per recert 04/08/18   Period Weeks   Status On-going     PT LONG TERM GOAL #3   Title Pt will improve TUG score to less than or equal to 16 seconds for decreased fall risk.  GOAL EXTENDED:  NEW TARGET DATE:  05/09/16   Baseline 20.65 sec 04/09/16   Time 4  4 weeks-per recert 09/11/76   Period Weeks   Status Not Met     PT LONG TERM GOAL #4   Title Pt will perform at least 3 minutes of standing with intermittent UE support with supervision, for improved standing balance and performance of ADLs.  GOAL EXTENDED:  NEW TARGET DATE 05/09/16   Baseline Pt stands <10 seconds without UE support prior to LOB posteriorly.   Time 4  4 weeks-per recert 10/17/54   Period Weeks   Status On-going     PT LONG TERM GOAL #5   Title Pt will verbalize understanding of fall prevention in home environment.  GOAL EXTENDED:  NEW TARGET DATE:  05/09/16   Time 4  4 weeks per recert 10/09/28   Period Weeks   Status On-going     PT LONG TERM GOAL #6   Title Pt will verbalize understanding of at least 3 means to manage cervical/neck pain with functional activities.  TARGET DATE:  05/09/16   Baseline Pain rated 4/10 bilateral upper traps   Time 4   Period Weeks   Status New               Plan - 05/06/16 1314    Clinical Impression Statement Pt partially met LTG #1.  He has been provided with HEP but does not consistently perform at home and needs cues for proper technique.  Pt continues to have decreased overall mobility and motivation at home.  Had MRI last week but has not discussed results with MD yet.  Discussed with pt and wife the plan to finish checking goals at next session and discuss discharge per POC.     Rehab Potential Good   PT Frequency 2x / week   PT Duration 4 weeks   PT Treatment/Interventions ADLs/Self Care Home Management;Therapeutic exercise;Therapeutic activities;Functional mobility training;Gait training;Neuromuscular re-education;Balance training;Patient/family  education;Moist Heat;Ultrasound;Electrical Stimulation;Iontophoresis 57m/ml Dexamethasone;Manual techniques;Passive range of motion   PT Next Visit Plan Finish checking goals and discuss discharge.   Consulted and Agree with Plan of Care Patient;Family member/caregiver   Family Member Consulted wife - BHorris Latino     Patient will benefit from skilled therapeutic intervention in order to improve the following deficits and impairments:  Abnormal gait, Decreased coordination, Decreased mobility, Decreased activity tolerance, Difficulty walking, Hypomobility, Improper body mechanics, Pain  Visit Diagnosis: Other abnormalities of gait and mobility  Muscle weakness (generalized)  Other symptoms and signs involving  the nervous system  Unsteadiness on feet  Abnormal posture     Problem List Patient Active Problem List   Diagnosis Date Noted  . DDD (degenerative disc disease), cervical 04/07/2016  . Neuropathy ([place])   . Bulging discs   . Gastritis   . Parkinsonian syndrome ([place])   . Hyperlipidemia     Narda Bonds 05/06/2016, 1:21 PM  [facility] [address] [place], [place], [address] Phone: [phone]   Fax:  [phone]  Name: STEPHAN NELIS MRN: [id number] Date of Birth: [birth year]   Narda Bonds, [place] 05/06/16 1:21 PM Phone: [phone] Fax: [phone]

## 2016-05-09 ENCOUNTER — Ambulatory Visit: Payer: Medicare Other | Attending: Family Medicine | Admitting: Physical Therapy

## 2016-05-09 DIAGNOSIS — R2681 Unsteadiness on feet: Secondary | ICD-10-CM | POA: Insufficient documentation

## 2016-05-09 DIAGNOSIS — R293 Abnormal posture: Secondary | ICD-10-CM | POA: Diagnosis not present

## 2016-05-09 DIAGNOSIS — R2689 Other abnormalities of gait and mobility: Secondary | ICD-10-CM | POA: Insufficient documentation

## 2016-05-09 NOTE — Patient Instructions (Signed)

## 2016-05-09 NOTE — Therapy (Signed)
[facility] [address] [place], [place], [address] Phone: [phone]   Fax:  [phone]  Physical Therapy Treatment  Patient Details  Name: Alex Frazier MRN: [id number] Date of Birth: 1945/12/05 Referring Provider: Dennard Schaumann  Encounter Date: 05/09/2016      PT End of Session - 05/09/16 1407    Visit Number 16   Number of Visits 16   Date for PT Re-Evaluation 06/08/16   Authorization Type Medicare/BCBS-GCODE every 10th visit   PT Start Time 1106   PT Stop Time 1146   PT Time Calculation (min) 40 min   Activity Tolerance Patient tolerated treatment well   Behavior During Therapy [facility] for tasks assessed/performed      Past Medical History:  Diagnosis Date  . B12 deficiency    Borderline  . Bulging discs   . Gastritis   . Hyperlipidemia   . Neuropathy ([place])   . Parkinsonian syndrome ([place])   . Peripheral neuropathy [facility])     Past Surgical History:  Procedure Laterality Date  . APPENDECTOMY    . BACK SURGERY      There were no vitals filed for this visit.      Subjective Assessment - 05/09/16 1109    Subjective Wife reports being able to get up from sitting better.  Using rollator walker better.   Patient is accompained by: Family member  wife   Pertinent History Parkinson's disease x 2-3 years   Patient Stated Goals Pt's goal is to improve walking and decrease falls.   Currently in Pain? Yes   Pain Score 4    Pain Location Neck   Pain Orientation Right;Left   Pain Descriptors / Indicators Aching   Pain Type Acute pain   Pain Onset More than a month ago   Aggravating Factors  turning head   Pain Relieving Factors heat, keeping head still; today is a better day                    Neuro Re-education:  Cues provided for initial scooting forward and use of UEs to boost up from chair.      [facility] Adult PT Treatment/Exercise - 05/09/16 0001      Transfers   Transfers Sit to  Stand;Stand to Sit   Sit to Stand 5: Supervision   Stand to Sit 5: Supervision   Number of Reps 10 reps  5 reps from mat; 10 reps from 18" chair     Ambulation/Gait   Ambulation/Gait Yes   Ambulation/Gait Assistance 5: Supervision   Ambulation Distance (Feet) 120 Feet   Assistive device Rollator   Gait Pattern Step-through pattern;Decreased step length - right;Decreased step length - left;Decreased dorsiflexion - right;Decreased dorsiflexion - left;Scissoring;Trunk flexed;Narrow base of support;Poor foot clearance - left;Poor foot clearance - right   Gait velocity 14.29 sec = 2.3 ft/sec   Gait Comments Attempted standing without UE support, 10 seconds, 20 seconds, with posterior lean, needs therapist assist to regain balance.     Self-Care   Self-Care Other Self-Care Comments   Other Self-Care Comments  Provided information on fall prevention, progress towards goals, continued neck pain and ways to manage; plans for discharge.  Discussed return screen in 6 months-pt/wife in agreement.     Self Care continued:  Discussed importance of HEP and consistent performance.  Wife asks to reiterate which exercises to continue-PT advises pt to continue the OTAGO based exercises in his current exercise folder.  Discussed management of  neck pain including posture exercises/best posture positioning.  Reviewed exercises that pt has questions on.  Discussed return screen in 6 months and pt in agreement.           PT Education - May 25, 2016 1407    Education provided Yes   Education Details POC, Progress towards goals, plans for discharge; fall prevention   Person(s) Educated Patient;Spouse   Methods Explanation;Handout   Comprehension Verbalized understanding             PT Long Term Goals - 05-25-16 1112      PT LONG TERM GOAL #1   Title Pt will verbalize understanding of HEP for improved balance, transfers, gait for improved functional mobility.  TARGET 04/06/16  GOAL EXTENDED:  New  TARGET DATE 2016/05/25   Baseline Revised and reviewed HEP on 05/06/16.  Pt not consistently performing HEP at home.   Time 4  4 weeks-recert 09/13/08 visit   Period Weeks   Status Partially Met     PT LONG TERM GOAL #2   Title Pt will perform at least 6 of 10 reps of sit<>stand transfers with minimal UE support, for improved transfer efficiency and safety.  EXTENDED GOAL:  NEW TARGET DATE:  May 25, 2016   Baseline --   Time 4  4 weeks per recert 05/15/03   Period Weeks   Status Achieved     PT LONG TERM GOAL #3   Title Pt will improve TUG score to less than or equal to 16 seconds for decreased fall risk.  GOAL EXTENDED:  NEW TARGET DATE:  05-25-16   Baseline 20.65 sec 04/09/16; 19.04 sec at best 25-May-2016   Time 4  4 weeks-per recert 01/10/08   Period Weeks   Status Not Met     PT LONG TERM GOAL #4   Title Pt will perform at least 3 minutes of standing with intermittent UE support with supervision, for improved standing balance and performance of ADLs.  GOAL EXTENDED:  NEW TARGET DATE 2016-05-25   Baseline Pt stands <10 seconds without UE support prior to LOB posteriorly.   Time 4  4 weeks-per recert 04/08/18   Period Weeks   Status Not Met     PT LONG TERM GOAL #5   Title Pt will verbalize understanding of fall prevention in home environment.  GOAL EXTENDED:  NEW TARGET DATE:  05-25-16   Time 4  4 weeks per recert 09/11/76   Period Weeks   Status Achieved     PT LONG TERM GOAL #6   Title Pt will verbalize understanding of at least 3 means to manage cervical/neck pain with functional activities.  TARGET DATE:  2016-05-25   Baseline verbalizes heat, exercises, plans to follow up with Dr. Nelva Bush per Dr. Dennard Schaumann referral   Time 4   Period Weeks   Status Achieved             Patient will benefit from skilled therapeutic intervention in order to improve the following deficits and impairments:     Visit Diagnosis: Other abnormalities of gait and mobility  Abnormal posture  Unsteadiness on feet        G-Codes - 25-May-2016 1409    Functional Assessment Tool Used TUG 19.04 sec, gait velocity 2.3 ft/sec   Functional Limitation Mobility: Walking and moving around   Mobility: Walking and Moving Around Goal Status [phone]) At least 40 percent but less than 60 percent impaired, limited or restricted   Mobility: Walking and Moving Around Discharge Status [phone])  At least 40 percent but less than 60 percent impaired, limited or restricted      Problem List Patient Active Problem List   Diagnosis Date Noted  . DDD (degenerative disc disease), cervical 04/07/2016  . Neuropathy ([place])   . Bulging discs   . Gastritis   . Parkinsonian syndrome ([place])   . Hyperlipidemia     Alenah Sarria W. 05/09/2016, 2:10 PM  Frazier Butt., PT [facility] [address] [place], [place], [address] Phone: [phone]   Fax:  [phone]  Name: Alex Frazier MRN: [id number] Date of Birth: 03-18-1946    PHYSICAL THERAPY DISCHARGE SUMMARY  Visits from Start of Care: 16  Current functional level related to goals / functional outcomes:     PT Long Term Goals - 05/09/16 1112      PT LONG TERM GOAL #1   Title Pt will verbalize understanding of HEP for improved balance, transfers, gait for improved functional mobility.  TARGET 04/06/16  GOAL EXTENDED:  New TARGET DATE 05/09/16   Baseline Revised and reviewed HEP on 05/06/16.  Pt not consistently performing HEP at home.   Time 4  4 weeks-recert 09/13/92 visit   Period Weeks   Status Partially Met     PT LONG TERM GOAL #2   Title Pt will perform at least 6 of 10 reps of sit<>stand transfers with minimal UE support, for improved transfer efficiency and safety.  EXTENDED GOAL:  NEW TARGET DATE:  05/09/16   Baseline --   Time 4  4 weeks per recert 5/0/38   Period Weeks   Status Achieved     PT LONG TERM GOAL #3   Title Pt will improve TUG score to less than or equal to 16 seconds for decreased fall  risk.  GOAL EXTENDED:  NEW TARGET DATE:  05/09/16   Baseline 20.65 sec 04/09/16; 19.04 sec at best 05/09/16   Time 4  4 weeks-per recert 04/16/27   Period Weeks   Status Not Met     PT LONG TERM GOAL #4   Title Pt will perform at least 3 minutes of standing with intermittent UE support with supervision, for improved standing balance and performance of ADLs.  GOAL EXTENDED:  NEW TARGET DATE 05/09/16   Baseline Pt stands <10 seconds without UE support prior to LOB posteriorly.   Time 4  4 weeks-per recert 0/0/34   Period Weeks   Status Not Met     PT LONG TERM GOAL #5   Title Pt will verbalize understanding of fall prevention in home environment.  GOAL EXTENDED:  NEW TARGET DATE:  05/09/16   Time 4  4 weeks per recert 05/09/78   Period Weeks   Status Achieved     PT LONG TERM GOAL #6   Title Pt will verbalize understanding of at least 3 means to manage cervical/neck pain with functional activities.  TARGET DATE:  05/09/16   Baseline verbalizes heat, exercises, plans to follow up with Dr. Nelva Bush per Dr. Dennard Schaumann referral   Time 4   Period Weeks   Status Achieved       Remaining deficits: Posture, neck pain, stiffness, balance   Education / Equipment: Pt/wife have been educated in HEP, fall prevention, ways to manage neck pain.  Plan: Patient agrees to discharge.  Patient goals were partially met. Patient is being discharged due to                                                     ?????  maximizing rehab potential at this time.  Pt would benefit from PT screen in 6 months due to progressive nature of disease.         Mady Haagensen, PT 05/09/16 2:16 PM Phone: [phone] Fax: [phone]

## 2016-05-30 DIAGNOSIS — M47812 Spondylosis without myelopathy or radiculopathy, cervical region: Secondary | ICD-10-CM | POA: Diagnosis not present

## 2016-06-02 ENCOUNTER — Other Ambulatory Visit: Payer: Self-pay | Admitting: Family Medicine

## 2016-06-23 DIAGNOSIS — M47812 Spondylosis without myelopathy or radiculopathy, cervical region: Secondary | ICD-10-CM | POA: Diagnosis not present

## 2016-06-26 ENCOUNTER — Ambulatory Visit (INDEPENDENT_AMBULATORY_CARE_PROVIDER_SITE_OTHER): Payer: Medicare Other | Admitting: *Deleted

## 2016-06-26 DIAGNOSIS — Z23 Encounter for immunization: Secondary | ICD-10-CM

## 2016-06-26 NOTE — Progress Notes (Signed)
Patient ID: Alex Frazier, male   DOB: 09-19-1945, 70 y.o.   MRN: [id number]  Patient seen in office for Influenza Vaccination.   Tolerated IM administration well.   Immunization history updated.

## 2016-07-15 ENCOUNTER — Other Ambulatory Visit: Payer: Self-pay | Admitting: Family Medicine

## 2016-07-22 DIAGNOSIS — G608 Other hereditary and idiopathic neuropathies: Secondary | ICD-10-CM | POA: Diagnosis not present

## 2016-07-22 DIAGNOSIS — G20 Parkinson's disease: Secondary | ICD-10-CM | POA: Diagnosis not present

## 2016-07-22 DIAGNOSIS — Z79899 Other long term (current) drug therapy: Secondary | ICD-10-CM | POA: Diagnosis not present

## 2016-07-22 DIAGNOSIS — G629 Polyneuropathy, unspecified: Secondary | ICD-10-CM | POA: Diagnosis not present

## 2016-07-29 ENCOUNTER — Other Ambulatory Visit: Payer: Self-pay | Admitting: Family Medicine

## 2016-07-29 DIAGNOSIS — M47812 Spondylosis without myelopathy or radiculopathy, cervical region: Secondary | ICD-10-CM | POA: Diagnosis not present

## 2016-07-29 MED ORDER — DULOXETINE HCL 60 MG PO CPEP: 60.0000 mg | ORAL_CAPSULE | Freq: Every day | ORAL | 3 refills | Status: DC

## 2016-08-05 ENCOUNTER — Encounter: Payer: Self-pay | Admitting: Family Medicine

## 2016-08-05 ENCOUNTER — Ambulatory Visit (INDEPENDENT_AMBULATORY_CARE_PROVIDER_SITE_OTHER): Payer: Medicare Other | Admitting: Family Medicine

## 2016-08-05 VITALS — BP 142/80 | HR 60 | Temp 98.1°F | Resp 18 | Ht 68.0 in | Wt 158.0 lb

## 2016-08-05 DIAGNOSIS — R634 Abnormal weight loss: Secondary | ICD-10-CM

## 2016-08-05 DIAGNOSIS — E7800 Pure hypercholesterolemia, unspecified: Secondary | ICD-10-CM | POA: Diagnosis not present

## 2016-08-05 DIAGNOSIS — R339 Retention of urine, unspecified: Secondary | ICD-10-CM | POA: Diagnosis not present

## 2016-08-05 LAB — CBC WITH DIFFERENTIAL/PLATELET
BASOS PCT: 0 %
Basophils Absolute: 0 cells/uL (ref 0–200)
EOS PCT: 0 %
Eosinophils Absolute: 0 cells/uL — ABNORMAL LOW (ref 15–500)
HCT: 44.7 % (ref 38.5–50.0)
Hemoglobin: 15.1 g/dL (ref 13.0–17.0)
LYMPHS ABS: 1110 {cells}/uL (ref 850–3900)
Lymphocytes Relative: 15 %
MCH: 30.1 pg (ref 27.0–33.0)
MCHC: 33.8 g/dL (ref 32.0–36.0)
MCV: 89 fL (ref 80.0–100.0)
MONOS PCT: 9 %
MPV: 8.8 fL (ref 7.5–12.5)
Monocytes Absolute: 666 cells/uL (ref 200–950)
NEUTROS ABS: 5624 {cells}/uL (ref 1500–7800)
Neutrophils Relative %: 76 %
PLATELETS: 257 10*3/uL (ref 140–400)
RBC: 5.02 MIL/uL (ref 4.20–5.80)
RDW: 14.2 % (ref 11.0–15.0)
WBC: 7.4 10*3/uL (ref 3.8–10.8)

## 2016-08-05 LAB — COMPLETE METABOLIC PANEL WITH GFR
ALT: 26 U/L (ref 9–46)
AST: 31 U/L (ref 10–35)
Albumin: 3.9 g/dL (ref 3.6–5.1)
Alkaline Phosphatase: 82 U/L (ref 40–115)
BILIRUBIN TOTAL: 0.5 mg/dL (ref 0.2–1.2)
BUN: 20 mg/dL (ref 7–25)
CO2: 30 mmol/L (ref 20–31)
CREATININE: 0.77 mg/dL (ref 0.70–1.18)
Calcium: 9.7 mg/dL (ref 8.6–10.3)
Chloride: 102 mmol/L (ref 98–110)
GFR, Est African American: >89 mL/min (ref >=60)
GFR, Est Non African American: >89 mL/min (ref >=60)
GLUCOSE: 92 mg/dL (ref 70–99)
Potassium: 4.7 mmol/L (ref 3.5–5.3)
SODIUM: 139 mmol/L (ref 135–146)
TOTAL PROTEIN: 6.5 g/dL (ref 6.1–8.1)

## 2016-08-05 LAB — LIPID PANEL
Cholesterol: 161 mg/dL (ref <200)
HDL: 53 mg/dL (ref >40)
LDL CALC: 92 mg/dL (ref <100)
Total CHOL/HDL Ratio: 3 Ratio (ref <5.0)
Triglycerides: 82 mg/dL (ref <150)
VLDL: 16 mg/dL (ref <30)

## 2016-08-05 LAB — TSH: TSH: 1.43 m[IU]/L (ref 0.40–4.50)

## 2016-08-05 MED ORDER — TAMSULOSIN HCL 0.4 MG PO CAPS: 0.4000 mg | ORAL_CAPSULE | Freq: Every day | ORAL | 3 refills | Status: DC

## 2016-08-05 NOTE — Progress Notes (Signed)
Subjective:    Patient ID: Alex Frazier, male    DOB: August 20, 1946, 70 y.o.   MRN: [id number]  HPI Patient is here today for a checkup. The pain he is experiencing in his neck in August is finally improved. He is been receiving trigger point injections and what sounds like epidural steroid injections through Dr. Irven [place] office. He is seeing significant benefit with this. He is also taking meloxicam however he reports epigastric discomfort and bloating. Given the fact that he is on aspirin and meloxicam I recommended he discontinue the meloxicam. Hopefully this will improve the epigastric discomfort. He has lost 10 pounds since his last office visit in July. He is asymptomatic other than the symptoms he has with his Parkinson's disease. He denies any fevers cough nausea vomiting diarrhea or melena or hematochezia. PSA was checked in May and was normal. Given the low loss, believe it would be prudent to check fecal occult blood cards to evaluate for possible GI problems. He is also due to check a TSH given his weight loss. He is here today mainly to recheck his cholesterol.  Past Medical History:  Diagnosis Date  . B12 deficiency    Borderline  . Bulging discs   . Gastritis   . Hyperlipidemia   . Neuropathy ([place])   . Parkinsonian syndrome ([place])   . Peripheral neuropathy [facility])    Current Outpatient Prescriptions on File Prior to Visit  Medication Sig Dispense Refill  . aspirin 325 MG tablet Take 325 mg by mouth daily.      . carbidopa-levodopa (SINEMET IR) 25-100 MG tablet 1 tablet 3 (three) times daily.     . Carbidopa-Levodopa ER (SINEMET CR) 25-100 MG tablet controlled release Take 1 tablet by mouth at bedtime.  2  . diazepam (VALIUM) 5 MG tablet Take 1 tablet (5 mg total) by mouth every 8 (eight) hours as needed for muscle spasms. 30 tablet 1  . DULoxetine (CYMBALTA) 60 MG capsule Take 1 capsule (60 mg total) by mouth daily. 90 capsule 3  . Fish Oil-Cholecalciferol (OMEGA-3 FISH OIL/VITAMIN  D3) 1000-1000 MG-UNIT CAPS Take by mouth.    . fluticasone (FLONASE) 50 MCG/ACT nasal spray Place 2 sprays into both nostrils daily. 16 g 6  . meloxicam (MOBIC) 7.5 MG tablet take 1 tablet by mouth once daily 30 tablet 1  . Polyethylene Glycol 3350 (MIRALAX PO) Take by mouth.    . pramipexole (MIRAPEX) 0.125 MG tablet Take 0.125 mg by mouth 3 (three) times daily.    . pravastatin (PRAVACHOL) 20 MG tablet Take 1 tablet (20 mg total) by mouth daily at 6 PM. 90 tablet 3  . saw palmetto 500 MG capsule Take 500 mg by mouth daily.     No current facility-administered medications on file prior to visit.    Allergies  Allergen Reactions  . Indomethacin   . Lyrica [Pregabalin]    Social History   Social History  . Marital status: Married    Spouse name: N/A  . Number of children: N/A  . Years of education: N/A   Occupational History  . Not on file.   Social History Main Topics  . Smoking status: Never Smoker  . Smokeless tobacco: Never Used  . Alcohol use No  . Drug use: No  . Sexual activity: Not Currently   Other Topics Concern  . Not on file   Social History Narrative  . No narrative on file     Review of Systems  All other  systems reviewed and are negative.      Objective:   Physical Exam  Constitutional: He appears well-developed and well-nourished.  HENT:  Nose: Nose normal.  Mouth/Throat: Oropharynx is clear and moist.  Eyes: Conjunctivae are normal. No scleral icterus.  Neck: Neck supple. No JVD present. No thyromegaly present.  Cardiovascular: Normal rate and regular rhythm.  Exam reveals no friction rub.   No murmur heard. Pulmonary/Chest: Effort normal and breath sounds normal. No respiratory distress. He has no wheezes. He has no rales.  Abdominal: Soft. Bowel sounds are normal. He exhibits no distension. There is no tenderness. There is no rebound and no guarding.  Musculoskeletal: He exhibits no edema.  Lymphadenopathy:    He has no cervical  adenopathy.  Skin: Skin is warm. No rash noted. No erythema. No pallor.  Vitals reviewed.   Patient has bradykinesia. He has no resting tremor. Has a slow shuffling gait and walks with a four-pronged cane.    Assessment & Plan:  Pure hypercholesterolemia - Plan: CBC with Differential/Platelet, COMPLETE METABOLIC PANEL WITH GFR, Lipid panel  Urinary retention - Plan: tamsulosin (FLOMAX) 0.4 MG CAPS capsule  Loss of weight - Plan: TSH, Fecal occult blood, imunochemical, Fecal occult blood, imunochemical, Fecal occult blood, imunochemical We will begin by working up his loss of weight by checking a thyroid as well as fecal occult blood cards 3 to evaluate for occult GI malignancy. I will also check a CBC and a CMP. If labs are normal and there is no evidence of GI bleeding, if the weight loss persist, the next step would be imaging of the chest abdomen and pelvis. Patient's biggest concern at this point is urinary hesitancy and urinary frequency. He will get the urge to urinate but then he reports a weak dribbling stream. He reports increased urinary frequency but also weak urinary stream. Symptoms are consistent with BPH versus neurogenic bladder. I will empirically start the patient on Flomax 0.4 mg by mouth daily and recheck in 3-4 weeks consider urodynamic studies if symptoms persist

## 2016-08-06 DIAGNOSIS — Z6822 Body mass index (BMI) 22.0-22.9, adult: Secondary | ICD-10-CM | POA: Diagnosis not present

## 2016-08-06 DIAGNOSIS — M47812 Spondylosis without myelopathy or radiculopathy, cervical region: Secondary | ICD-10-CM | POA: Diagnosis not present

## 2016-08-07 ENCOUNTER — Other Ambulatory Visit: Payer: Medicare Other

## 2016-08-07 DIAGNOSIS — R634 Abnormal weight loss: Secondary | ICD-10-CM | POA: Diagnosis not present

## 2016-08-08 LAB — FECAL OCCULT BLOOD, IMMUNOCHEMICAL
FECAL OCCULT BLOOD: NEGATIVE
Fecal Occult Blood: NEGATIVE
Fecal Occult Blood: NEGATIVE

## 2016-08-11 ENCOUNTER — Encounter: Payer: Self-pay | Admitting: Family Medicine

## 2016-08-11 ENCOUNTER — Ambulatory Visit
Admission: RE | Admit: 2016-08-11 | Discharge: 2016-08-11 | Disposition: A | Discharge status: Home / Self Care | Payer: Medicare Other | Source: Ambulatory Visit | Attending: Family Medicine | Admitting: Family Medicine

## 2016-08-11 ENCOUNTER — Other Ambulatory Visit: Payer: Self-pay | Admitting: Family Medicine

## 2016-08-11 DIAGNOSIS — R634 Abnormal weight loss: Secondary | ICD-10-CM

## 2016-08-11 DIAGNOSIS — H25013 Cortical age-related cataract, bilateral: Secondary | ICD-10-CM | POA: Diagnosis not present

## 2016-08-11 DIAGNOSIS — H43813 Vitreous degeneration, bilateral: Secondary | ICD-10-CM | POA: Diagnosis not present

## 2016-08-11 DIAGNOSIS — H2513 Age-related nuclear cataract, bilateral: Secondary | ICD-10-CM | POA: Diagnosis not present

## 2016-08-11 LAB — HM DIABETES EYE EXAM

## 2016-08-12 ENCOUNTER — Encounter: Payer: Self-pay | Admitting: Family Medicine

## 2016-08-15 ENCOUNTER — Other Ambulatory Visit: Payer: Self-pay

## 2016-09-05 DIAGNOSIS — G20 Parkinson's disease: Secondary | ICD-10-CM | POA: Diagnosis not present

## 2016-09-05 DIAGNOSIS — E7800 Pure hypercholesterolemia, unspecified: Secondary | ICD-10-CM | POA: Diagnosis not present

## 2016-09-05 DIAGNOSIS — Z79899 Other long term (current) drug therapy: Secondary | ICD-10-CM | POA: Diagnosis not present

## 2016-10-10 ENCOUNTER — Other Ambulatory Visit: Payer: Self-pay | Admitting: Family Medicine

## 2016-10-10 DIAGNOSIS — E785 Hyperlipidemia, unspecified: Secondary | ICD-10-CM

## 2016-10-20 ENCOUNTER — Other Ambulatory Visit: Payer: Self-pay | Admitting: Family Medicine

## 2016-10-20 DIAGNOSIS — E785 Hyperlipidemia, unspecified: Secondary | ICD-10-CM

## 2016-10-20 MED ORDER — PRAVASTATIN SODIUM 20 MG PO TABS: ORAL_TABLET | ORAL | 1 refills | Status: DC

## 2016-10-20 NOTE — Telephone Encounter (Signed)
Mail order pharmacy never got refill Pravastatin.  Refill resent

## 2016-11-13 ENCOUNTER — Ambulatory Visit: Payer: Medicare Other | Admitting: Physical Therapy

## 2016-12-04 ENCOUNTER — Ambulatory Visit: Payer: Medicare Other | Attending: Family Medicine | Admitting: Physical Therapy

## 2016-12-04 DIAGNOSIS — R2689 Other abnormalities of gait and mobility: Secondary | ICD-10-CM

## 2016-12-04 NOTE — Therapy (Signed)
[facility] [address] [place], [place], [address] Phone: [phone]   Fax:  [phone]  Patient Details  Name: Alex Frazier MRN: [id number] Date of Birth: 02-May-1946 Referring Provider:  Susy Frizzle, MD  Encounter Date: 12/04/2016  Physical Therapy Parkinson's Disease Screen   Timed Up and Go test: 24.44 sec  10 meter walk test:13.84 sec (2.37 ft/sec)  5 time sit to stand test:  Unable without use of hands for UE assist   Pt reports having a fall several days ago; has had several falls in the past 6 months, with falls occurring outside with cane.    Patient would benefit from Physical Therapy evaluation due to recent history of falls, wife's reports of decreased safety with gait and transfers.      Ulis Kaps W. 12/04/2016, 10:31 AM  Frazier Butt., PT  [facility] [address] [place], [place], [address] Phone: [phone]   Fax:  [phone]

## 2016-12-08 ENCOUNTER — Other Ambulatory Visit: Payer: Self-pay | Admitting: Family Medicine

## 2016-12-08 DIAGNOSIS — G20 Parkinson's disease: Secondary | ICD-10-CM

## 2016-12-31 ENCOUNTER — Ambulatory Visit (HOSPITAL_COMMUNITY)
Admission: EM | Admit: 2016-12-31 | Discharge: 2016-12-31 | Disposition: A | Discharge status: Home / Self Care | Payer: Medicare Other | Attending: Internal Medicine | Admitting: Internal Medicine

## 2016-12-31 ENCOUNTER — Encounter (HOSPITAL_COMMUNITY): Payer: Self-pay | Admitting: Emergency Medicine

## 2016-12-31 DIAGNOSIS — M79602 Pain in left arm: Secondary | ICD-10-CM

## 2016-12-31 DIAGNOSIS — W19XXXA Unspecified fall, initial encounter: Secondary | ICD-10-CM

## 2016-12-31 NOTE — ED Provider Notes (Signed)
CSN: [id number]     Arrival date & time 12/31/16  1854 History   First MD Initiated Contact with Patient 12/31/16 2047     Chief Complaint  Patient presents with  . Fall  . Arm Pain   (Consider location/radiation/quality/duration/timing/severity/associated sxs/prior Treatment) 71 year old male coming in by his wife has a history of Parkinson disease and neuropathy. He has a propensity to fall. He has fallen twice in the past several days. He fell 10 days ago and was holding onto the refrigerator door with a left hand and is had pain in the left upper extremity since that time. He did not fall onto the floor. Near or about the same time he had another fall in which she fell onto the left arm. He is complaining of persistent soreness to various areas of the upper arm and left elbow. Denies injury to the head, neck, back, right upper extremity or lower extremities. He is fully awake and alert and lucid.      Past Medical History:  Diagnosis Date  . B12 deficiency    Borderline  . Bulging discs   . Gastritis   . Hyperlipidemia   . Neuropathy   . Parkinsonian syndrome ([place])   . Peripheral neuropathy    Past Surgical History:  Procedure Laterality Date  . APPENDECTOMY    . BACK SURGERY     History reviewed. No pertinent family history. Social History  Substance Use Topics  . Smoking status: Never Smoker  . Smokeless tobacco: Never Used  . Alcohol use No    Review of Systems  Constitutional: Negative.   HENT: Negative.   Respiratory: Negative.   Gastrointestinal: Negative.   Genitourinary: Negative.   Musculoskeletal: Negative for back pain, joint swelling, neck pain and neck stiffness.       As per HPI  Skin: Negative.   Neurological: Negative for dizziness, facial asymmetry, weakness, numbness and headaches.    Allergies  Indomethacin and Lyrica [pregabalin]  Home Medications   Prior to Admission medications   Medication Sig Start Date End Date Taking? Authorizing  Provider  aspirin 325 MG tablet Take 325 mg by mouth daily.      Historical Provider, MD  carbidopa-levodopa (SINEMET IR) 25-100 MG tablet 1 tablet 3 (three) times daily.  09/07/15   Historical Provider, MD  Carbidopa-Levodopa ER (SINEMET CR) 25-100 MG tablet controlled release Take 1 tablet by mouth at bedtime. 07/07/15   Historical Provider, MD  diazepam (VALIUM) 5 MG tablet Take 1 tablet (5 mg total) by mouth every 8 (eight) hours as needed for muscle spasms. 03/14/16   Susy Frizzle, MD  DULoxetine (CYMBALTA) 60 MG capsule Take 1 capsule (60 mg total) by mouth daily. 07/29/16   Susy Frizzle, MD  Fish Oil-Cholecalciferol (OMEGA-3 FISH OIL/VITAMIN D3) 1000-1000 MG-UNIT CAPS Take by mouth.    Historical Provider, MD  fluticasone (FLONASE) 50 MCG/ACT nasal spray Place 2 sprays into both nostrils daily. 01/09/14   Susy Frizzle, MD  meloxicam (MOBIC) 7.5 MG tablet take 1 tablet by mouth once daily 06/02/16   Susy Frizzle, MD  Polyethylene Glycol 3350 (MIRALAX PO) Take by mouth.    Historical Provider, MD  pramipexole (MIRAPEX) 0.125 MG tablet Take 0.125 mg by mouth 3 (three) times daily.    Historical Provider, MD  pravastatin (PRAVACHOL) 20 MG tablet TAKE 1 TABLET BY MOUTH DAILY AT [facility] 10/20/16   Susy Frizzle, MD  saw palmetto 500 MG capsule Take 500 mg by mouth  daily.    Historical Provider, MD  tamsulosin (FLOMAX) 0.4 MG CAPS capsule Take 1 capsule (0.4 mg total) by mouth daily. 08/05/16   Susy Frizzle, MD   Meds Ordered and Administered this Visit  Medications - No data to display  BP 111/61 (BP Location: Right Arm)   Pulse 84   Temp 97.9 F (36.6 C) (Oral)   Resp 18   SpO2 100%  No data found.   Physical Exam  Constitutional: He is oriented to person, place, and time. He appears well-developed and well-nourished.  HENT:  Head: Normocephalic and atraumatic.  Eyes: EOM are normal. Left eye exhibits no discharge.  Neck: Normal range of motion. Neck supple.   Pulmonary/Chest: Effort normal.  Musculoskeletal:  [place] additional left upper extremity reveals no shoulder joint pain or tenderness. No swelling or asymmetry. She is able to abduct the arm to approximately 100. Limitation is due to pain in the left deltoid muscle. There is tenderness to the deltoid muscle and the medial tricep muscle. No bony tenderness. No tenderness along the shoulder joint line. No swelling or deformity. Flexion and extension of the elbow is brisk and complete. He is able to flex and extend against resistance. There is tenderness over the lateral epicondyles. No bony tenderness over the olecranon. No bony tenderness to the radius or ulna. Demonstrates supination and pronation which is complete. Hit fist strength is 5 over 5. Extension and flexion of the wrist is complete. Distal neurovascular motor sensory is grossly intact. Compression of the bones in regards to tapping along the longitudinal axis does not produce pain. No evidence of fracture. Soft tissue tenderness only.  Neurological: He is alert and oriented to person, place, and time. No cranial nerve deficit.  Skin: Skin is warm and dry. No erythema.  Psychiatric: He has a normal mood and affect.  Nursing note and vitals reviewed.   Urgent Care Course     Procedures (including critical care time)  Labs Review Labs Reviewed - No data to display  Imaging Review No results found.   Visual Acuity Review  Right Eye Distance:   Left Eye Distance:   Bilateral Distance:    Right Eye Near:   Left Eye Near:    Bilateral Near:         MDM   1. Fall, initial encounter   2. Left arm pain    Use ice on the outside of the left elbow. Other areas she may apply heat. You may continue giving 200 mg of ibuprofen every 6-8 hours if needed. When administering that medicine administer with a little food or milk. You may also administer Tylenol if that helps. Follow up with your primary care doctor next week. For any  new symptoms or problems may return.     Janne Napoleon, NP 12/31/16 2112    Janne Napoleon, NP 12/31/16 2112

## 2016-12-31 NOTE — Discharge Instructions (Signed)
Use ice on the outside of the left elbow. Other areas she may apply heat. You may continue giving 200 mg of ibuprofen every 6-8 hours if needed. When administering that medicine administer with a little food or milk. You may also administer Tylenol if that helps. Follow up with your primary care doctor next week. For any new symptoms or problems may return.

## 2016-12-31 NOTE — ED Triage Notes (Signed)
The patient presented to the [facility] with a complaint of left upper arm pain secondary to a fall that occurred 10 days ago.

## 2017-01-06 ENCOUNTER — Ambulatory Visit: Payer: Medicare Other | Admitting: Physical Therapy

## 2017-01-13 ENCOUNTER — Ambulatory Visit
Admission: RE | Admit: 2017-01-13 | Discharge: 2017-01-13 | Disposition: A | Discharge status: Home / Self Care | Payer: Medicare Other | Source: Ambulatory Visit | Attending: Family Medicine | Admitting: Family Medicine

## 2017-01-13 ENCOUNTER — Ambulatory Visit (INDEPENDENT_AMBULATORY_CARE_PROVIDER_SITE_OTHER): Payer: Medicare Other | Admitting: Family Medicine

## 2017-01-13 VITALS — BP 110/60 | HR 80 | Temp 98.0°F | Resp 16 | Ht 68.0 in | Wt 157.0 lb

## 2017-01-13 DIAGNOSIS — W19XXXA Unspecified fall, initial encounter: Secondary | ICD-10-CM

## 2017-01-13 DIAGNOSIS — M25512 Pain in left shoulder: Secondary | ICD-10-CM | POA: Diagnosis not present

## 2017-01-13 DIAGNOSIS — S4992XA Unspecified injury of left shoulder and upper arm, initial encounter: Secondary | ICD-10-CM | POA: Diagnosis not present

## 2017-01-13 DIAGNOSIS — M79602 Pain in left arm: Secondary | ICD-10-CM

## 2017-01-13 NOTE — Progress Notes (Signed)
Subjective:    Patient ID: Alex Frazier, male    DOB: July 30, 1946, 71 y.o.   MRN: [id number]  HPI Patient Parkinson's disease is progressing. He fell 2 weeks ago striking his left arm against the refrigerator as he fell. Since that time he complains of paroxysmal intermittent pain in his left arm that shoots from his left shoulder down to his left wrist. The pain is sharp and intense. However today he is pain-free. He has no pain with range of motion of his left shoulder. He has full abduction and abduction without pain. He has full internal Rotation without pain. He has a negative empty can sign. He also has full flexion and extension, supination and pronation of his left elbow without pain. There is no palpable deformity in the left wrist or in the left hand. There is no swelling or erythema and MRI of the neck performed in August of last year did show grade 1 retrolisthesis with moderate left foraminal stenosis. I question if this may have worsened after the fall possibly causing neuropathic pain in the left arm is his exam today is normal. He denies any numbness or tingling in the left arm. Past Medical History:  Diagnosis Date  . B12 deficiency    Borderline  . Bulging discs   . Gastritis   . Hyperlipidemia   . Neuropathy   . Parkinsonian syndrome ([place])   . Peripheral neuropathy    Past Surgical History:  Procedure Laterality Date  . APPENDECTOMY    . BACK SURGERY     Current Outpatient Prescriptions on File Prior to Visit  Medication Sig Dispense Refill  . aspirin 325 MG tablet Take 325 mg by mouth daily.      . carbidopa-levodopa (SINEMET IR) 25-100 MG tablet 1 tablet 3 (three) times daily.     . Carbidopa-Levodopa ER (SINEMET CR) 25-100 MG tablet controlled release Take 1 tablet by mouth at bedtime.  2  . DULoxetine (CYMBALTA) 60 MG capsule Take 1 capsule (60 mg total) by mouth daily. 90 capsule 3  . Fish Oil-Cholecalciferol (OMEGA-3 FISH OIL/VITAMIN D3) 1000-1000 MG-UNIT CAPS  Take by mouth.    . fluticasone (FLONASE) 50 MCG/ACT nasal spray Place 2 sprays into both nostrils daily. 16 g 6  . meloxicam (MOBIC) 7.5 MG tablet take 1 tablet by mouth once daily 30 tablet 1  . Polyethylene Glycol 3350 (MIRALAX PO) Take by mouth.    . pramipexole (MIRAPEX) 0.125 MG tablet Take 0.125 mg by mouth 3 (three) times daily.    . pravastatin (PRAVACHOL) 20 MG tablet TAKE 1 TABLET BY MOUTH DAILY AT 6PM 90 tablet 1  . saw palmetto 500 MG capsule Take 500 mg by mouth daily.     No current facility-administered medications on file prior to visit.    Allergies  Allergen Reactions  . Indomethacin   . Lyrica [Pregabalin]    Social History   Social History  . Marital status: Married    Spouse name: N/A  . Number of children: N/A  . Years of education: N/A   Occupational History  . Not on file.   Social History Main Topics  . Smoking status: Never Smoker  . Smokeless tobacco: Never Used  . Alcohol use No  . Drug use: No  . Sexual activity: Not Currently   Other Topics Concern  . Not on file   Social History Narrative  . No narrative on file     Review of Systems  All other  systems reviewed and are negative.      Objective:   Physical Exam  Cardiovascular: Normal rate, regular rhythm and normal heart sounds.   Pulmonary/Chest: Effort normal and breath sounds normal.  Musculoskeletal:       Left shoulder: He exhibits pain. He exhibits normal range of motion, no tenderness and no bony tenderness.       Left elbow: He exhibits normal range of motion, no swelling and no effusion. Tenderness found. Medial epicondyle tenderness noted.       Cervical back: He exhibits decreased range of motion and pain.       Back:       Left upper arm: He exhibits no tenderness, no bony tenderness, no swelling, no edema and no deformity.  Vitals reviewed.         Assessment & Plan:  Left arm pain - Plan: DG Shoulder Left, DG Humerus Left  Fall, initial encounter  Exam  is unremarkable. Pain makes me suspicious for neuropathic pain. I will obtain an x-ray of the left shoulder as this seems to be the area where he complains of the most pain even though his exam today is normal and also obtain an x-ray of the left humerus and elbow. If this is normal, I will treat the patient with an anti-inflammatory for a few weeks if symptoms will improve. Consider neuropathic pain from the cervical spine as a possible cause. Meanwhile the patient needs really to get started in physical therapy. I spent 15 minutes discussing this with him. His Parkinson's is progressing and is becoming more unsteady on his feet and his falling more often. I explained that this will eventually lead to a fall risk, required placement if he is not careful. I strongly encouraged him to consider physical therapy but the present time is not interested.

## 2017-01-14 ENCOUNTER — Other Ambulatory Visit: Payer: Self-pay | Admitting: Family Medicine

## 2017-01-14 MED ORDER — DICLOFENAC SODIUM 75 MG PO TBEC: 75.0000 mg | DELAYED_RELEASE_TABLET | Freq: Two times a day (BID) | ORAL | 3 refills | Status: DC

## 2017-01-19 ENCOUNTER — Ambulatory Visit: Payer: Medicare Other | Admitting: Physical Therapy

## 2017-02-03 ENCOUNTER — Ambulatory Visit: Payer: BLUE CROSS/BLUE SHIELD | Admitting: Family Medicine

## 2017-02-12 ENCOUNTER — Ambulatory Visit: Payer: Medicare Other | Attending: Family Medicine | Admitting: Physical Therapy

## 2017-02-12 DIAGNOSIS — M6281 Muscle weakness (generalized): Secondary | ICD-10-CM | POA: Insufficient documentation

## 2017-02-12 DIAGNOSIS — R2689 Other abnormalities of gait and mobility: Secondary | ICD-10-CM

## 2017-02-12 DIAGNOSIS — R2681 Unsteadiness on feet: Secondary | ICD-10-CM | POA: Diagnosis not present

## 2017-02-12 DIAGNOSIS — R293 Abnormal posture: Secondary | ICD-10-CM | POA: Insufficient documentation

## 2017-02-12 NOTE — Therapy (Signed)
[facility] [address] [place], [place], [address] Phone: [phone]   Fax:  [phone]  Physical Therapy Evaluation  Patient Details  Name: Alex Frazier MRN: [id number] Date of Birth: April 25, 1946 Referring Provider: Jenna Luo  Encounter Date: 02/12/2017      PT End of Session - 02/12/17 1206    Visit Number 1   Number of Visits 9   Date for PT Re-Evaluation 04/13/17   Authorization Type Medicare Traditional primary, BCBS 2nd-GCODE every 10th visit   PT Start Time 0847   PT Stop Time 0934   PT Time Calculation (min) 47 min   Equipment Utilized During Treatment Gait belt   Activity Tolerance Patient tolerated treatment well   Behavior During Therapy [facility] for tasks assessed/performed      Past Medical History:  Diagnosis Date  . B12 deficiency    Borderline  . Bulging discs   . Gastritis   . Hyperlipidemia   . Neuropathy   . Parkinsonian syndrome ([place])   . Peripheral neuropathy     Past Surgical History:  Procedure Laterality Date  . APPENDECTOMY    . BACK SURGERY      There were no vitals filed for this visit.       Subjective Assessment - 02/12/17 0853    Subjective Pt had been seen for PT screen in March 2018, recommending PT.  Delay in coming into therapy due to wife having medical issue.  Reports having at least 10 falls in the past 6 months.  He uses 4-wheeled RW in the home.  He feels more comfortable outside with cane.  Several falls occurred when not using walker.  Has been to medical doctor several times due to L shoulder pain from falls.   Patient is accompained by: Family member  wife   Pertinent History bulging discs, neuropathy, Parkinsonism, L shoulder pain from recent falls   Patient Stated Goals Pt's goals for therapy (wife reports he just doesn't want to be here).  Wife's goal is to be safer with walker.   Currently in Pain? Yes   Pain Score 5    Pain Location Arm   Pain  Orientation Left;Upper   Pain Descriptors / Indicators Throbbing   Pain Type Chronic pain  approx 2 months   Pain Onset More than a month ago   Pain Frequency Constant   Aggravating Factors  pushing on it aggravates   Pain Relieving Factors pain gel   Effect of Pain on Daily Activities PT will monitor pain, but will not address specifically as a goal, due to being followed medically/out of scope of this eval.            [facility] PT Assessment - 02/12/17 0901      Assessment   Medical Diagnosis Parkinson's disease   Referring Provider Jenna Luo   Onset Date/Surgical Date 12/04/16     Precautions   Precautions Fall   Required Braces or Orthoses Other Brace/Splint  wears AFO on LLE due to previous foot drop     Balance Screen   Has the patient fallen in the past 6 months Yes   How many times? 10   Has the patient had a decrease in activity level because of a fear of falling?  Yes   Is the patient reluctant to leave their home because of a fear of falling?  Yes     [place]  Spouse/significant other   Available Help at Discharge Family  Wife injured her back-unable to lift >10#   Type of [place] to enter  plans for ramp at back entry   Entrance Stairs-Number of Steps 4   Entrance Stairs-Rails Right   Home Layout One level;Laundry or work area in basement;Able to live on main level with Programmer, applications - 4 wheels;Cane - single point;Shower seat;Grab bars - tub/shower;Walker - 2 wheels     Prior Function   Level of Independence Independent with basic ADLs;Independent with household mobility with device;Independent with community mobility with device   Leisure Enjoys mechanic-type work on [employer]     Observation/Other Assessments   Focus on Therapeutic Outcomes (FOTO)  NA     Posture/Postural Control   Posture/Postural Control Postural  limitations   Postural Limitations Rounded Shoulders;Forward head   Posture Comments Upon standing, pt has increased forward lean, "hanging" on anterior aspect of hips, creating instability.     ROM / Strength   AROM / PROM / Strength Strength     Strength   Overall Strength Deficits   Strength Assessment Site Hip;Knee;Ankle   Right/Left Hip Right;Left   Right Hip Flexion 3+/5   Left Hip Flexion 3+/5   Right/Left Knee Right;Left   Right Knee Flexion 3+/5   Right Knee Extension 3+/5   Left Knee Flexion 3+/5   Left Knee Extension 3+/5   Right/Left Ankle Right;Left   Right Ankle Dorsiflexion 3-/5   Left Ankle Dorsiflexion --  NA-wearing L AFO     Transfers   Transfers Sit to Stand;Stand to Sit   Sit to Stand 5: Supervision;With upper extremity assist;From chair/3-in-1;With armrests   Stand to Sit 5: Supervision;With upper extremity assist;With armrests;To chair/3-in-1;Uncontrolled descent;Without upper extremity assist  Does not lock brakes of walker prior to sitting     Ambulation/Gait   Ambulation/Gait Yes   Ambulation/Gait Assistance 5: Supervision;4: Min guard   Ambulation Distance (Feet) 120 Feet   Assistive device Rollator   Gait Pattern Step-through pattern;Decreased step length - right;Decreased step length - left;Decreased hip/knee flexion - right;Decreased hip/knee flexion - left;Decreased dorsiflexion - right;Decreased dorsiflexion - left;Poor foot clearance - left;Poor foot clearance - right  Significant forward lean onto walker   Ambulation Surface Level;Indoor   Gait velocity 15.09 sec = 2.17 ft/sec     Standardized Balance Assessment   Standardized Balance Assessment Timed Up and Go Test;Berg Balance Test     Berg Balance Test   Sit to Stand Able to stand  independently using hands   Standing Unsupported Unable to stand 30 seconds unassisted   Sitting with Back Unsupported but Feet Supported on Floor or Stool Able to sit safely and securely 2 minutes   Stand  to Sit Sits independently, has uncontrolled descent   Transfers Able to transfer with verbal cueing and /or supervision   Standing Unsupported with Eyes Closed Needs help to keep from falling   Standing Ubsupported with Feet Together Needs help to attain position and unable to hold for 15 seconds   From Standing, Reach Forward with Outstretched Arm Loses balance while trying/requires external support   From Standing Position, Pick up Object from Floor Unable to try/needs assist to keep balance   From Standing Position, Turn to Look Behind Over each Shoulder Needs assist to keep from losing balance and falling   Turn 360 Degrees Needs assistance while turning   Standing Unsupported, Alternately Place  Feet on Step/Stool Needs assistance to keep from falling or unable to try   Standing Unsupported, One Foot in [employer] balance while stepping or standing   Standing on One Leg Unable to try or needs assist to prevent fall   Total Score 10   Berg comment: Pt unable to stand unsupported >13.34 seconds (on third trial).  Scores <45/56 indicate increased fall risk; Scores <37/56 indicate need for walker for safety with walking.     Timed Up and Go Test   Normal TUG (seconds) 22.66   Cognitive TUG (seconds) 24.57  stopped counting upon standing   TUG Comments Scores >13.5-15 seconds indicate increased fall risk.            Objective measurements completed on examination: See above findings.                  PT Education - 02/12/17 1204    Education provided Yes   Education Details Educated in results of Berg, TUG and gait velocity, indicating increased fall risk.  Educated in need for use of rollator walker at all times (indoors and outdoors) for safety based on Berg score; discussed safety/technique with transfers   Person(s) Educated Patient   Methods Explanation;Demonstration   Comprehension Verbalized understanding;Returned demonstration;Verbal cues required;Need further  instruction          PT Short Term Goals - 02/12/17 1214      PT SHORT TERM GOAL #1   Title Pt will perform HEP with wife's supervision for improved balance and gait.  TARGET 03/13/17   Time 4   Period Weeks   Status New     PT SHORT TERM GOAL #2   Title Pt will be able to stand at least 1 minute without UE support for improved standing balance.   Time 4   Period Weeks   Status New     PT SHORT TERM GOAL #3   Title Pt will perform at least 4 of 5 reps of sit<>stand transfers with minimal UE support, modified independently for improved transfer safety and efficiency.   Time 4   Period Weeks   Status New     PT SHORT TERM GOAL #4   Title Pt will improve TUG score to less than or equal to 18 seconds for decreased fall risk.   Time 4   Period Weeks   Status New     PT SHORT TERM GOAL #5   Title Pt will verbalize understanding of fall prevention in home environment.   Time 4   Period Weeks   Status New           PT Long Term Goals - 02/12/17 1217      PT LONG TERM GOAL #1   Title Pt will improve Berg Balance test to at least 20/56 for decreased fall risk.  TARGET 04/13/17   Time 8   Period Weeks   Status New     PT LONG TERM GOAL #2   Title Pt will perform at least 3 minutes of standing at home with intermittent UE support, for improved participation in ADLs.   Time 8   Period Weeks   Status New     PT LONG TERM GOAL #3   Title Pt will improve gait velocity to at least 2.4 ft/sec for improved gait efficiency and safety.   Time 8   Period Weeks   Status New  Plan - 2017-02-22 1205-12-15    Clinical Impression Statement Pt is a 71 year old male who presents to OP PT at recommendation of PT when pt was seen for PD screen on 12/04/16.  Pt has history of Parkinson's disease, at least 10 falls in the past 6 months, presenting with stiffness/rigidity, decreased balance, abnormal posture, decreased timing and coordation of gait, muscle weakness, decreased  safety awareness.  Pt is at increased fall risk per Berg score of 10/56 and TUG score of 22.66 sec.  Pt would benefit from further skilled PT to address the above stated deficits to decrease fall risk and improve functional mobility.   History and Personal Factors relevant to plan of care: bulging discs, neuropathy, Parkinsonism, L foot drop (with AFO), L shoulder pain; 10 falls in past 6 months   Clinical Presentation Evolving   Clinical Presentation due to: high fall risk, shoulder pain due to recent fall, decreased safety awareness   Clinical Decision Making Moderate   Rehab Potential Fair   Clinical Impairments Affecting Rehab Potential Pt's stated not wanting to continue with therapy; wife present to help carryover safety recommendations; pt agreeable to 1x/wk   PT Frequency 1x / week   PT Duration 8 weeks  plus eval   PT Treatment/Interventions ADLs/Self Care Home Management;Functional mobility training;Gait training;DME Instruction;Neuromuscular re-education;Patient/family education;Balance training;Therapeutic exercise;Therapeutic activities   PT Next Visit Plan Safety with walker, transfers; HEP for balance, safety with bathroom negotiation   Consulted and Agree with Plan of Care Patient;Family member/caregiver   Family Member Consulted wife      Patient will benefit from skilled therapeutic intervention in order to improve the following deficits and impairments:  Abnormal gait, Decreased balance, Decreased mobility, Decreased safety awareness, Decreased strength, Difficulty walking, Postural dysfunction, Impaired flexibility  Visit Diagnosis: Other abnormalities of gait and mobility  Unsteadiness on feet  Muscle weakness (generalized)  Abnormal posture      G-Codes - 02-22-17 12/15/1217    Functional Assessment Tool Used (Outpatient Only) gt vel 2.17 ft/sec, TUG 22.66 sec, cog 24.57 sec, Berg 10/56, stand unsupported 13.34 sec; 10 falls in past 6 months   Functional Limitation  Mobility: Walking and moving around   Mobility: Walking and Moving Around Current Status [phone]) At least 40 percent but less than 60 percent impaired, limited or restricted   Mobility: Walking and Moving Around Goal Status [phone]) At least 20 percent but less than 40 percent impaired, limited or restricted       Problem List Patient Active Problem List   Diagnosis Date Noted  . DDD (degenerative disc disease), cervical 04/07/2016  . Neuropathy   . Bulging discs   . Gastritis   . Parkinsonian syndrome ([place])   . Hyperlipidemia     Priya Matsen W. 02-22-17, 12:20 PM [name] [address] [place], [place], [address] Phone: [phone]   Fax:  [phone]  Name: Alex Frazier MRN: [id number] Date of Birth: 1946-07-22

## 2017-02-16 ENCOUNTER — Ambulatory Visit (INDEPENDENT_AMBULATORY_CARE_PROVIDER_SITE_OTHER): Payer: Medicare Other | Admitting: Family Medicine

## 2017-02-16 VITALS — BP 118/60 | HR 80 | Temp 98.0°F | Resp 14 | Ht 68.0 in | Wt 157.0 lb

## 2017-02-16 DIAGNOSIS — E7800 Pure hypercholesterolemia, unspecified: Secondary | ICD-10-CM

## 2017-02-16 DIAGNOSIS — M79602 Pain in left arm: Secondary | ICD-10-CM

## 2017-02-16 LAB — CBC WITH DIFFERENTIAL/PLATELET
BASOS PCT: 1 %
Basophils Absolute: 52 cells/uL (ref 0–200)
Eosinophils Absolute: 52 cells/uL (ref 15–500)
Eosinophils Relative: 1 %
HEMATOCRIT: 43.1 % (ref 38.5–50.0)
HEMOGLOBIN: 14.3 g/dL (ref 13.0–17.0)
LYMPHS ABS: 1248 {cells}/uL (ref 850–3900)
Lymphocytes Relative: 24 %
MCH: 29.2 pg (ref 27.0–33.0)
MCHC: 33.2 g/dL (ref 32.0–36.0)
MCV: 88 fL (ref 80.0–100.0)
MONO ABS: 468 {cells}/uL (ref 200–950)
MPV: 8.9 fL (ref 7.5–12.5)
Monocytes Relative: 9 %
Neutro Abs: 3380 cells/uL (ref 1500–7800)
Neutrophils Relative %: 65 %
Platelets: 244 10*3/uL (ref 140–400)
RBC: 4.9 MIL/uL (ref 4.20–5.80)
RDW: 14.3 % (ref 11.0–15.0)
WBC: 5.2 10*3/uL (ref 3.8–10.8)

## 2017-02-16 LAB — COMPLETE METABOLIC PANEL WITH GFR
ALBUMIN: 4 g/dL (ref 3.6–5.1)
ALK PHOS: 98 U/L (ref 40–115)
ALT: 15 U/L (ref 9–46)
AST: 20 U/L (ref 10–35)
BUN: 25 mg/dL (ref 7–25)
CALCIUM: 9.2 mg/dL (ref 8.6–10.3)
CHLORIDE: 103 mmol/L (ref 98–110)
CO2: 24 mmol/L (ref 20–31)
CREATININE: 0.91 mg/dL (ref 0.70–1.18)
GFR, Est African American: >89 mL/min (ref >=60)
GFR, Est Non African American: 85 mL/min (ref >=60)
Glucose, Bld: 86 mg/dL (ref 70–99)
Potassium: 4.4 mmol/L (ref 3.5–5.3)
Sodium: 138 mmol/L (ref 135–146)
TOTAL PROTEIN: 6.4 g/dL (ref 6.1–8.1)
Total Bilirubin: 0.5 mg/dL (ref 0.2–1.2)

## 2017-02-16 LAB — LIPID PANEL
CHOLESTEROL: 146 mg/dL (ref <200)
HDL: 36 mg/dL — ABNORMAL LOW (ref >40)
LDL Cholesterol: 84 mg/dL (ref <100)
Total CHOL/HDL Ratio: 4.1 Ratio (ref <5.0)
Triglycerides: 128 mg/dL (ref <150)
VLDL: 26 mg/dL (ref <30)

## 2017-02-16 NOTE — Progress Notes (Signed)
Subjective:    Patient ID: Alex Frazier, male    DOB: 1945-11-19, 71 y.o.   MRN: [id number]  HPI  01/13/17 Patient'sParkinson's disease is progressing. He fell 2 weeks ago striking his left arm against the refrigerator as he fell. Since that time he complains of paroxysmal intermittent pain in his left arm that shoots from his left shoulder down to his left wrist. The pain is sharp and intense. However today he is pain-free. He has no pain with range of motion of his left shoulder. He has full abduction and abduction without pain. He has full internal Rotation without pain. He has a negative empty can sign. He also has full flexion and extension, supination and pronation of his left elbow without pain. There is no palpable deformity in the left wrist or in the left hand. There is no swelling or erythema and MRI of the neck performed in August of last year did show grade 1 retrolisthesis with moderate left foraminal stenosis. I question if this may have worsened after the fall possibly causing neuropathic pain in the left arm is his exam today is normal. He denies any numbness or tingling in the left arm.  AT that time, my plan was: Exam is unremarkable. Pain makes me suspicious for neuropathic pain. I will obtain an x-ray of the left shoulder as this seems to be the area where he complains of the most pain even though his exam today is normal and also obtain an x-ray of the left humerus and elbow. If this is normal, I will treat the patient with an anti-inflammatory for a few weeks if symptoms will improve. Consider neuropathic pain from the cervical spine as a possible cause. Meanwhile the patient needs really to get started in physical therapy. I spent 15 minutes discussing this with him. His Parkinson's is progressing and is becoming more unsteady on his feet and his falling more often. I explained that this will eventually lead to a fall risk, required placement if he is not careful. I strongly  encouraged him to consider physical therapy but the present time is not interested.  02/16/17 X-rays were unremarkable. Patient was tried on diclofenac 75 mg by mouth twice a day to see no benefit since starting this medication. He is here today to recheck his cholesterol monitor his liver function test. However his biggest concern is persistent pain in his left shoulder. Now is having similar pain in his right shoulder. Pain is not exacerbated by abduction greater than 100. He has no pain with internal or external rotation although he does have crepitus in both shoulders left greater than right. His pain seems to be secondary to overuse. Pain is exacerbated when he uses his arms to lift himself up onto the bed and transfer from wheelchair to bed or vice versa. The pain is centered in his shoulder with no radicular symptoms into his neck or into his arms at the present time. The pain is similar bilaterally although the left is greater than the right Past Medical History:  Diagnosis Date  . B12 deficiency    Borderline  . Bulging discs   . Gastritis   . Hyperlipidemia   . Neuropathy   . Parkinsonian syndrome ([place])   . Peripheral neuropathy    Past Surgical History:  Procedure Laterality Date  . APPENDECTOMY    . BACK SURGERY     Current Outpatient Prescriptions on File Prior to Visit  Medication Sig Dispense Refill  . carbidopa-levodopa (SINEMET  IR) 25-100 MG tablet 1 tablet 3 (three) times daily.     . Carbidopa-Levodopa ER (SINEMET CR) 25-100 MG tablet controlled release Take 1 tablet by mouth at bedtime.  2  . diclofenac (VOLTAREN) 75 MG EC tablet Take 1 tablet (75 mg total) by mouth 2 (two) times daily. 60 tablet 3  . DULoxetine (CYMBALTA) 60 MG capsule Take 1 capsule (60 mg total) by mouth daily. 90 capsule 3  . Fish Oil-Cholecalciferol (OMEGA-3 FISH OIL/VITAMIN D3) 1000-1000 MG-UNIT CAPS Take by mouth.    . fluticasone (FLONASE) 50 MCG/ACT nasal spray Place 2 sprays into both nostrils  daily. 16 g 6  . Polyethylene Glycol 3350 (MIRALAX PO) Take by mouth.    . pramipexole (MIRAPEX) 0.125 MG tablet Take 0.125 mg by mouth 3 (three) times daily.    . pravastatin (PRAVACHOL) 20 MG tablet TAKE 1 TABLET BY MOUTH DAILY AT 6PM 90 tablet 1  . saw palmetto 500 MG capsule Take 500 mg by mouth daily.    Marland Kitchen aspirin 325 MG tablet Take 325 mg by mouth daily.       No current facility-administered medications on file prior to visit.    Allergies  Allergen Reactions  . Indomethacin   . Lyrica [Pregabalin]    Social History   Social History  . Marital status: Married    Spouse name: N/A  . Number of children: N/A  . Years of education: N/A   Occupational History  . Not on file.   Social History Main Topics  . Smoking status: Never Smoker  . Smokeless tobacco: Never Used  . Alcohol use No  . Drug use: No  . Sexual activity: Not Currently   Other Topics Concern  . Not on file   Social History Narrative  . No narrative on file     Review of Systems  All other systems reviewed and are negative.      Objective:   Physical Exam  Cardiovascular: Normal rate, regular rhythm and normal heart sounds.   No murmur heard. Pulmonary/Chest: Effort normal and breath sounds normal. No respiratory distress. He has no wheezes. He has no rales.  Abdominal: Soft. Bowel sounds are normal.  Musculoskeletal:       Right shoulder: He exhibits crepitus, pain and decreased strength. He exhibits normal range of motion, no tenderness and no bony tenderness.       Left shoulder: He exhibits crepitus, pain and decreased strength. He exhibits normal range of motion, no tenderness and no bony tenderness.       Right elbow: Normal.      Left elbow: He exhibits normal range of motion, no swelling and no effusion. No tenderness found. No medial epicondyle tenderness noted.       Cervical back: He exhibits decreased range of motion. He exhibits no pain.       Left upper arm: He exhibits no  tenderness, no bony tenderness, no swelling, no edema and no deformity.       Arms: Vitals reviewed.         Assessment & Plan:  Left arm pain  Pure hypercholesterolemia - Plan: CBC with Differential/Platelet, COMPLETE METABOLIC PANEL WITH GFR, Lipid panel  While here, will obtain a CMP and fasting lipid panel along with a CBC. His goal LDL cholesterol is less than 130. His blood pressure is excellent. I am at a loss as to what to do for his arm pain. The pain is centered in his shoulders. There is no  symptoms of PMR he has no pain in his gluteus. He has no pain in his hips or thighs. I suspect shoulder pain secondary to overuse and degenerative joint disease. We'll try a cortisone injection in the left shoulder today. Using sterile technique from a posterior approach, I injected the left shoulder with 2 mL of lidocaine, 2 mL of Marcaine, and 2 mL of 40 mg per mL Kenalog. Patient tolerated the procedure well with no complication. Reassess in one week and if beneficial we can perform the same procedure on the right shoulder

## 2017-02-17 ENCOUNTER — Encounter: Payer: Self-pay | Admitting: Family Medicine

## 2017-02-24 ENCOUNTER — Ambulatory Visit: Payer: Medicare Other | Admitting: Physical Therapy

## 2017-02-24 DIAGNOSIS — R2689 Other abnormalities of gait and mobility: Secondary | ICD-10-CM | POA: Diagnosis not present

## 2017-02-24 DIAGNOSIS — M6281 Muscle weakness (generalized): Secondary | ICD-10-CM | POA: Diagnosis not present

## 2017-02-24 DIAGNOSIS — R293 Abnormal posture: Secondary | ICD-10-CM

## 2017-02-24 DIAGNOSIS — R2681 Unsteadiness on feet: Secondary | ICD-10-CM

## 2017-02-24 NOTE — Therapy (Signed)
[facility] [address] [place], [place], [address] Phone: [phone]   Fax:  [phone]  Physical Therapy Treatment  Patient Details  Name: Alex Frazier MRN: [id number] Date of Birth: 12-13-1945 Referring Provider: Jenna Luo  Encounter Date: 02/24/2017      PT End of Session - 02/24/17 2218    Visit Number 2   Number of Visits 9   Date for PT Re-Evaluation 04/13/17   Authorization Type Medicare Traditional primary, BCBS 2nd-GCODE every 10th visit   PT Start Time 1019   PT Stop Time 1101   PT Time Calculation (min) 42 min   Equipment Utilized During Treatment Gait belt   Activity Tolerance Patient tolerated treatment well   Behavior During Therapy WFL for tasks assessed/performed      Past Medical History:  Diagnosis Date  . B12 deficiency    Borderline  . Bulging discs   . Gastritis   . Hyperlipidemia   . Neuropathy   . Parkinsonian syndrome ([place])   . Peripheral neuropathy     Past Surgical History:  Procedure Laterality Date  . APPENDECTOMY    . BACK SURGERY      There were no vitals filed for this visit.      Subjective Assessment - 02/24/17 1021    Subjective Think I've gotten relief from my shoulder pain with the shots.  No falls since the last visit.     Patient is accompained by: Family member  wife   Pertinent History bulging discs, neuropathy, Parkinsonism, L shoulder pain from recent falls   Patient Stated Goals Pt's goals for therapy (wife reports he just doesn't want to be here).  Wife's goal is to be safer with walker.   Currently in Pain? Yes   Pain Score 5    Pain Location Heel   Pain Orientation Right;Left   Pain Descriptors / Indicators Burning   Pain Type Chronic pain   Pain Onset More than a month ago   Pain Frequency Intermittent   Aggravating Factors  unsure   Pain Relieving Factors pain medication                         [facility] Adult PT  Treatment/Exercise - 02/24/17 1023      Transfers   Transfers Sit to Stand;Stand to Sit   Sit to Stand 5: Supervision;4: Min guard;With upper extremity assist;From chair/3-in-1;From bed   Sit to Stand Details Verbal cues for technique;Verbal cues for sequencing;Tactile cues for posture   Stand to Sit 5: Supervision;4: Min guard;With upper extremity assist;To chair/3-in-1;To bed   Number of Reps 2 sets;Other reps (comment)  5 reps from 20" mat, then from 18" chair   Comments At least 3 other reps from mat during session, with minimal initial cues.  Pt demo improved safety with transfers during PT session as compared to eval.     Ambulation/Gait   Ambulation/Gait Yes   Ambulation/Gait Assistance 5: Supervision;4: Min guard   Ambulation Distance (Feet) 25 Feet  x 2, then 125   Assistive device Rollator   Gait Pattern Step-through pattern;Decreased step length - right;Decreased step length - left;Decreased hip/knee flexion - right;Decreased hip/knee flexion - left;Decreased dorsiflexion - right;Decreased dorsiflexion - left;Poor foot clearance - left;Poor foot clearance - right  bilateral shoulder elevation; relaxed briefly with cues   Ambulation Surface Level;Indoor   Gait Comments Verbal and tactile cues provided during gait and standing to relax shoulders and  for upright posture.     High Level Balance   High Level Balance Activities Side stepping;Turns  Quarter turns:  walker<>counter<>chair, x 3 reps   High Level Balance Comments Sidestepping along counter x 2 reps each direction.  Anterior/posterior weightshifting x 15 reps hip/ankle strategy work.  Side step and weightshift x 10 reps each leg, then back step and weightshift x 10 reps each leg, cues for upright posture.     With sidestepping turns, cues provided for slowed turns with UE support.           PT Education - 02/24/17 2217    Education provided Yes   Education Details Proper technique for sit<>stand transfers,  HEP-see instructions   Person(s) Educated Patient;Spouse   Methods Explanation;Demonstration;Handout   Comprehension Verbalized understanding;Returned demonstration;Verbal cues required;Need further instruction          PT Short Term Goals - 02/12/17 1214      PT SHORT TERM GOAL #1   Title Pt will perform HEP with wife's supervision for improved balance and gait.  TARGET 03/13/17   Time 4   Period Weeks   Status New     PT SHORT TERM GOAL #2   Title Pt will be able to stand at least 1 minute without UE support for improved standing balance.   Time 4   Period Weeks   Status New     PT SHORT TERM GOAL #3   Title Pt will perform at least 4 of 5 reps of sit<>stand transfers with minimal UE support, modified independently for improved transfer safety and efficiency.   Time 4   Period Weeks   Status New     PT SHORT TERM GOAL #4   Title Pt will improve TUG score to less than or equal to 18 seconds for decreased fall risk.   Time 4   Period Weeks   Status New     PT SHORT TERM GOAL #5   Title Pt will verbalize understanding of fall prevention in home environment.   Time 4   Period Weeks   Status New           PT Long Term Goals - 02/12/17 1217      PT LONG TERM GOAL #1   Title Pt will improve Berg Balance test to at least 20/56 for decreased fall risk.   Time 8   Period Weeks   Status New     PT LONG TERM GOAL #2   Title Pt will perform at least 3 minutes of standing at home with intermittent UE support, for improved participation in ADLs.   Time 8   Period Weeks   Status New     PT LONG TERM GOAL #3   Title Pt will improve gait velocity to at least 2.4 ft/sec for improved gait efficiency and safety.   Time 8   Period Weeks   Status New               Plan - 02/24/17 2218    Clinical Impression Statement Skilled PT session focused on review and practice of safe, proper technique for sit<>stand transfers as well as initiation of HEP to address balance  and turns.  Pt needs max verbal and tactile cues for relaxed shoulders posture while standing and walking.  Cues provided for control and pacing of movement patterns for optimal safety.  Pt will continue to benefit from skilled PT to address transfers, gait and balance.   Rehab Potential Fair  Clinical Impairments Affecting Rehab Potential Pt's stated not wanting to continue with therapy; wife present to help carryover safety recommendations; pt agreeable to 1x/wk   PT Frequency 1x / week   PT Duration 8 weeks  plus eval   PT Treatment/Interventions ADLs/Self Care Home Management;Functional mobility training;Gait training;DME Instruction;Neuromuscular re-education;Patient/family education;Balance training;Therapeutic exercise;Therapeutic activities   PT Next Visit Plan Review HEP and continue to update for balance; discuss safety with bathroom negotiation   Consulted and Agree with Plan of Care Patient;Family member/caregiver   Family Member Consulted wife      Patient will benefit from skilled therapeutic intervention in order to improve the following deficits and impairments:  Abnormal gait, Decreased balance, Decreased mobility, Decreased safety awareness, Decreased strength, Difficulty walking, Postural dysfunction, Impaired flexibility  Visit Diagnosis: Unsteadiness on feet  Abnormal posture  Other abnormalities of gait and mobility     Problem List Patient Active Problem List   Diagnosis Date Noted  . DDD (degenerative disc disease), cervical 04/07/2016  . Neuropathy   . Bulging discs   . Gastritis   . Parkinsonian syndrome ([place])   . Hyperlipidemia     Jerolene Kupfer W. 02/24/2017, 10:23 PM  Frazier Butt., PT   [facility] [address] [place], [place], [address] Phone: [phone]   Fax:  [phone]  Name: Alex Frazier MRN: [id number] Date of Birth: [birth year]

## 2017-02-24 NOTE — Patient Instructions (Addendum)
Sit to Stand Transfers:  1. Scoot out to the edge of the chair 2. Place your feet flat on the floor, shoulder width apart.  Make sure your feet are tucked just under your knees. 3. Lean forward (nose over toes) with momentum, and stand up tall with your best posture.  If you need to use your arms, use them as a quick boost up to stand. 4. If you are in a low or soft chair, you can lean back and then forward up to stand, in order to get more momentum. 5. Once you are standing, make sure you are looking ahead and standing tall.  To sit down:  1. Back up until you feel the chair behind your legs. 2. Bend at you hips, reaching  Back for you chair, if needed, then slowly squat to sit down on your chair.  Side-Stepping    Standing at the counter.  Walk to left side with eyes open. Take even steps, leading with same foot. Make sure each foot lifts off the floor for the length of the counter.  Repeat in opposite direction. Repeat for _3___  Lengths of the counter each direction.  Copyright  VHI. All rights reserved.  Turning in Place: Solid Surface    Standing in place, lead with head and turn slowly making quarter turns toward left-from counter to your walker.  Reverse from walker to counter.  You can have a chair on the other side.  Think quarter turns or clock turns, :  12:00, 3:00, 6:00, 9:00.  Make sure to clear your feet and have support with your arms. Repeat _5___ times per session. Do _1-2__ sessions per day.  Think about doing this each time you turn!  Copyright  VHI. All rights reserved.

## 2017-03-04 ENCOUNTER — Ambulatory Visit: Payer: Medicare Other | Admitting: Physical Therapy

## 2017-03-04 DIAGNOSIS — M6281 Muscle weakness (generalized): Secondary | ICD-10-CM | POA: Diagnosis not present

## 2017-03-04 DIAGNOSIS — R2689 Other abnormalities of gait and mobility: Secondary | ICD-10-CM

## 2017-03-04 DIAGNOSIS — R2681 Unsteadiness on feet: Secondary | ICD-10-CM | POA: Diagnosis not present

## 2017-03-04 DIAGNOSIS — R293 Abnormal posture: Secondary | ICD-10-CM | POA: Diagnosis not present

## 2017-03-04 NOTE — Therapy (Signed)
[facility] [address] [place], [place], [address] Phone: [phone]   Fax:  [phone]  Physical Therapy Treatment  Patient Details  Name: Alex Frazier MRN: [id number] Date of Birth: Jan 09, 1946 Referring Provider: Jenna Luo  Encounter Date: 03/04/2017      PT End of Session - 03/04/17 2045    Visit Number 3   Number of Visits 9   Date for PT Re-Evaluation 04/13/17   Authorization Type Medicare Traditional primary, BCBS 2nd-GCODE every 10th visit   PT Start Time 1107   PT Stop Time 1147   PT Time Calculation (min) 40 min   Activity Tolerance Patient tolerated treatment well   Behavior During Therapy [facility] for tasks assessed/performed      Past Medical History:  Diagnosis Date  . B12 deficiency    Borderline  . Bulging discs   . Gastritis   . Hyperlipidemia   . Neuropathy   . Parkinsonian syndrome ([place])   . Peripheral neuropathy     Past Surgical History:  Procedure Laterality Date  . APPENDECTOMY    . BACK SURGERY      There were no vitals filed for this visit.      Subjective Assessment - 03/04/17 1110    Subjective Had a fall Monday night in the kitchen trying to back up using the walker.   Patient is accompained by: Family member  wife   Pertinent History bulging discs, neuropathy, Parkinsonism, L shoulder pain from recent falls   Patient Stated Goals Pt's goals for therapy (wife reports he just doesn't want to be here).  Wife's goal is to be safer with walker.   Currently in Pain? No/denies   Pain Onset More than a month ago                         [facility] Adult PT Treatment/Exercise - 03/04/17 1111      Transfers   Transfers Sit to Stand;Stand to Sit   Sit to Stand 5: Supervision;With upper extremity assist;From chair/3-in-1;From bed   Stand to Sit 5: Supervision;4: Min guard;With upper extremity assist;To chair/3-in-1;To bed   Number of Reps 2 sets;Other reps  (comment)  5 reps; then additional reps through session   Comments Practiced turns in tight spaces, with cues for weightshifting, widening BOS and increased foot clearance, using RW.     Ambulation/Gait   Ambulation/Gait Yes   Ambulation/Gait Assistance 5: Supervision;4: Min guard   Ambulation/Gait Assistance Details Additional short distance gait using RW (actually trying to simulate SW for home use in bathroom, but no SW available today, x 10 ft x 2, with therapist cues and min guard assistance.)   Ambulation Distance (Feet) 115 Feet  x 2, then 30 ft x 2   Assistive device Rollator   Gait Pattern Step-through pattern;Decreased step length - right;Decreased step length - left;Decreased hip/knee flexion - right;Decreased hip/knee flexion - left;Decreased dorsiflexion - right;Decreased dorsiflexion - left;Poor foot clearance - left;Poor foot clearance - right   Ambulation Surface Level;Indoor   Gait Comments Practiced turns as noted above, using rollator walker,with space turning, and with change of direction turning with gait.  Pt requires verbal cueing throughout.       High Level Balance   High Level Balance Activities Side stepping;Turns  Quarter turns:  walker<>counter<>chair 3 reps   High Level Balance Comments Reviewed sidestepping along counter, and quarter turns from HEP.  Pt needs cues for foot clearance  with sidestepping and cues for UE support safely with turns     Therapeutic Activites    Therapeutic Activities Other Therapeutic Activities   Other Therapeutic Activities Simulated bathroom activities, including use of quad cane along counter, short distance gait to simulate counter>toilet.  Cues for upright posture, and to avoid reaching forward for rails of commode.  Cues to slow pace and slow descent into sitting on simulated commode.  Simulated activities at kitchen table, including maneuvering from rollator>table to chair, to sit as well as safe return from chair to stand at  table and to walker.  Cues for widened BOS, sidestepping to turn and having plenty of space from walker to chair to avoid tripping hazard.                PT Education - 03/04/17 2044    Education provided Yes   Education Details Safety with getting to and from dinner table with rollator, turns, reviewed HEP; safety with bathroom negotiation.  Wife present to verbalize understanding for assistance with cueing patient at home.   Person(s) Educated Patient;Spouse   Methods Explanation;Demonstration;Verbal cues   Comprehension Verbalized understanding;Returned demonstration;Verbal cues required;Need further instruction          PT Short Term Goals - 02/12/17 1214      PT SHORT TERM GOAL #1   Title Pt will perform HEP with wife's supervision for improved balance and gait.  TARGET 03/13/17   Time 4   Period Weeks   Status New     PT SHORT TERM GOAL #2   Title Pt will be able to stand at least 1 minute without UE support for improved standing balance.   Time 4   Period Weeks   Status New     PT SHORT TERM GOAL #3   Title Pt will perform at least 4 of 5 reps of sit<>stand transfers with minimal UE support, modified independently for improved transfer safety and efficiency.   Time 4   Period Weeks   Status New     PT SHORT TERM GOAL #4   Title Pt will improve TUG score to less than or equal to 18 seconds for decreased fall risk.   Time 4   Period Weeks   Status New     PT SHORT TERM GOAL #5   Title Pt will verbalize understanding of fall prevention in home environment.   Time 4   Period Weeks   Status New           PT Long Term Goals - 02/12/17 1217      PT LONG TERM GOAL #1   Title Pt will improve Berg Balance test to at least 20/56 for decreased fall risk.   Time 8   Period Weeks   Status New     PT LONG TERM GOAL #2   Title Pt will perform at least 3 minutes of standing at home with intermittent UE support, for improved participation in ADLs.   Time 8    Period Weeks   Status New     PT LONG TERM GOAL #3   Title Pt will improve gait velocity to at least 2.4 ft/sec for improved gait efficiency and safety.   Time 8   Period Weeks   Status New               Plan - 03/04/17 2046    Clinical Impression Statement Skilled PT session focused on transfers, simulated bathroom and kitchen negotiation, with rollator (  or possibly SW or quad cane at home).  Patient demonstrates decreased safety awareness in these simulated situations, but does improve with verbal cues.  Wife is present to verbalize understanding of ways to cue patient for increased safety at home, but wife visibly frustrated at likely lack of patient carryover of safety awareness in home situations.     Rehab Potential Fair   Clinical Impairments Affecting Rehab Potential Pt's stated not wanting to continue with therapy; wife present to help carryover safety recommendations; pt agreeable to 1x/wk   PT Frequency 1x / week   PT Duration 8 weeks  plus eval   PT Treatment/Interventions ADLs/Self Care Home Management;Functional mobility training;Gait training;DME Instruction;Neuromuscular re-education;Patient/family education;Balance training;Therapeutic exercise;Therapeutic activities   PT Next Visit Plan Follow up on kitchen, bathroom negotiation (?use of SW in bathroom); update HEP as able for balance and functional strenghtening, posture   Consulted and Agree with Plan of Care Patient;Family member/caregiver   Family Member Consulted wife      Patient will benefit from skilled therapeutic intervention in order to improve the following deficits and impairments:  Abnormal gait, Decreased balance, Decreased mobility, Decreased safety awareness, Decreased strength, Difficulty walking, Postural dysfunction, Impaired flexibility  Visit Diagnosis: Other abnormalities of gait and mobility  Unsteadiness on feet     Problem List Patient Active Problem List   Diagnosis Date Noted   . DDD (degenerative disc disease), cervical 04/07/2016  . Neuropathy   . Bulging discs   . Gastritis   . Parkinsonian syndrome ([place])   . Hyperlipidemia     Vinicio Lynk W. 03/04/2017, 8:50 PM  Frazier Butt., PT   [facility] [address] [place], [place], [address] Phone: [phone]   Fax:  [phone]  Name: SIMUEL STEBNER MRN: [id number] Date of Birth: [birth year]

## 2017-03-05 DIAGNOSIS — R296 Repeated falls: Secondary | ICD-10-CM | POA: Diagnosis not present

## 2017-03-05 DIAGNOSIS — G20 Parkinson's disease: Secondary | ICD-10-CM | POA: Diagnosis not present

## 2017-03-06 ENCOUNTER — Ambulatory Visit: Payer: Medicare Other | Admitting: Physical Therapy

## 2017-03-09 ENCOUNTER — Ambulatory Visit: Payer: Medicare Other | Attending: Family Medicine | Admitting: Physical Therapy

## 2017-03-09 DIAGNOSIS — R2689 Other abnormalities of gait and mobility: Secondary | ICD-10-CM | POA: Diagnosis not present

## 2017-03-09 DIAGNOSIS — R2681 Unsteadiness on feet: Secondary | ICD-10-CM | POA: Diagnosis not present

## 2017-03-09 NOTE — Therapy (Signed)
[facility] [address] [place], [place], [address] Phone: [phone]   Fax:  [phone]  Physical Therapy Treatment  Patient Details  Name: Alex Frazier MRN: [id number] Date of Birth: 05/24/1946 Referring Provider: Jenna Frazier  Encounter Date: 03/09/2017      PT End of Session - 03/09/17 2151    Visit Number 4   Number of Visits 9   Date for PT Re-Evaluation 04/13/17   Authorization Type Medicare Traditional primary, BCBS 2nd-GCODE every 10th visit   PT Start Time 1018   PT Stop Time 1100   PT Time Calculation (min) 42 min   Activity Tolerance Patient tolerated treatment well   Behavior During Therapy [facility] for tasks assessed/performed      Past Medical History:  Diagnosis Date  . B12 deficiency    Borderline  . Bulging discs   . Gastritis   . Hyperlipidemia   . Neuropathy   . Parkinsonian syndrome ([place])   . Peripheral neuropathy     Past Surgical History:  Procedure Laterality Date  . APPENDECTOMY    . BACK SURGERY      There were no vitals filed for this visit.      Subjective Assessment - 03/09/17 1022    Subjective Went to see neurologist at [facility] last week.  They have suggested a U-step RW.  Fell Wednesday night, trying to back up to sit and fell backwards, with the walker still in my hands.   Patient is accompained by: Family member  wife   Pertinent History bulging discs, neuropathy, Parkinsonism, L shoulder pain from recent falls   Patient Stated Goals Pt's goals for therapy (wife reports he just doesn't want to be here).  Wife's goal is to be safer with walker.   Currently in Pain? No/denies   Pain Onset More than a month ago                         [facility] Adult PT Treatment/Exercise - 03/09/17 1027      Ambulation/Gait   Ambulation/Gait Yes   Ambulation/Gait Assistance 5: Supervision;4: Min guard   Ambulation/Gait Assistance Details Trial of U-step RW,  including turns around furniture, turning to sit at chair, x 2.   Ambulation Distance (Feet) 150 Feet  x 2 with U-step RW   Assistive device Rollator  U-Step RW   Gait Pattern Step-through pattern;Decreased step length - right;Decreased step length - left;Decreased hip/knee flexion - right;Decreased hip/knee flexion - left;Decreased dorsiflexion - right;Decreased dorsiflexion - left;Poor foot clearance - left;Poor foot clearance - right   Ambulation Surface Level;Indoor   Pre-Gait Activities Cues provided for technique of using U-ste RW; cues for increased step length.   Gait Comments Discussed options for U-step RW, including possible benefits.  Continued to focus on safety with transitions from assistive device to chair or to table to sit.  Pt requires cues for positioning of walker, releasing handles to activate brakes, deliberate steps clearing floor in side or back direction.  Practiced multiple reps of backward stepping, 5-10 ft, using U-step RW and using rollator walker, to highlight safety with slowed, deliberate pace and foot clearance.     Self-Care   Self-Care Other Self-Care Comments   Other Self-Care Comments  Discussed options for and potential benefits of U-step RW.  Discussed ongoing safety concerns for patient,especially with transition movements between walker to furniture or to sit.  Discussed techniques to reduce LOB, in posterior  direction. -see instructions.  Verbally reviewed transfer technqiue at kitchen table  and in bathroom discussed last visit.  Wife reports not enough room for standard walker for support in bathroom.                 PT Education - 03/09/17 2150    Education provided Yes   Education Details Potenital benefits from U-step RW; tips to reduce posterior LOB with gait   Person(s) Educated Patient;Spouse   Methods Explanation;Demonstration;Verbal cues   Comprehension Verbalized understanding;Returned demonstration;Verbal cues required           PT Short Term Goals - 02/12/17 1214      PT SHORT TERM GOAL #1   Title Pt will perform HEP with wife's supervision for improved balance and gait.  TARGET 03/13/17   Time 4   Period Weeks   Status New     PT SHORT TERM GOAL #2   Title Pt will be able to stand at least 1 minute without UE support for improved standing balance.   Time 4   Period Weeks   Status New     PT SHORT TERM GOAL #3   Title Pt will perform at least 4 of 5 reps of sit<>stand transfers with minimal UE support, modified independently for improved transfer safety and efficiency.   Time 4   Period Weeks   Status New     PT SHORT TERM GOAL #4   Title Pt will improve TUG score to less than or equal to 18 seconds for decreased fall risk.   Time 4   Period Weeks   Status New     PT SHORT TERM GOAL #5   Title Pt will verbalize understanding of fall prevention in home environment.   Time 4   Period Weeks   Status New           PT Long Term Goals - 02/12/17 1217      PT LONG TERM GOAL #1   Title Pt will improve Berg Balance test to at least 20/56 for decreased fall risk.   Time 8   Period Weeks   Status New     PT LONG TERM GOAL #2   Title Pt will perform at least 3 minutes of standing at home with intermittent UE support, for improved participation in ADLs.   Time 8   Period Weeks   Status New     PT LONG TERM GOAL #3   Title Pt will improve gait velocity to at least 2.4 ft/sec for improved gait efficiency and safety.   Time 8   Period Weeks   Status New               Plan - 03/09/17 2152    Clinical Impression Statement Trial of U-step RW this visit, based on pt's script for that type of walker from neurologist.  Discussed that U-step RW may provide additional support and safety with gait, and pt does note he feels more secure with this type of walker during trial in PT session.  PT continues to be concerned regarding safety with gait and transitions with backing up and with transitions to  sit.  Will continue to benefit from skilled PT for blaance, gait training, especially if pt decides to pursue U-step RW.   Rehab Potential Fair   Clinical Impairments Affecting Rehab Potential Pt's stated not wanting to continue with therapy; wife present to help carryover safety recommendations; pt agreeable to 1x/wk   PT  Frequency 1x / week   PT Duration 8 weeks  plus eval   PT Treatment/Interventions ADLs/Self Care Home Management;Functional mobility training;Gait training;DME Instruction;Neuromuscular re-education;Patient/family education;Balance training;Therapeutic exercise;Therapeutic activities   PT Next Visit Plan PT to follow up on how to pursue U-step RW; gait training with U-step RW; check LTGs and discuss further PT   Consulted and Agree with Plan of Care Patient;Family member/caregiver   Family Member Consulted wife      Patient will benefit from skilled therapeutic intervention in order to improve the following deficits and impairments:  Abnormal gait, Decreased balance, Decreased mobility, Decreased safety awareness, Decreased strength, Difficulty walking, Postural dysfunction, Impaired flexibility  Visit Diagnosis: Other abnormalities of gait and mobility  Unsteadiness on feet     Problem List Patient Active Problem List   Diagnosis Date Noted  . DDD (degenerative disc disease), cervical 04/07/2016  . Neuropathy   . Bulging discs   . Gastritis   . Parkinsonian syndrome ([place])   . Hyperlipidemia     Chase Knebel W. 03/09/2017, 9:58 PM  Frazier Butt., PT   [facility] [address] [place], [place], [address] Phone: [phone]   Fax:  [phone]  Name: Alex Frazier MRN: [id number] Date of Birth: October 20, 1945

## 2017-03-09 NOTE — Patient Instructions (Signed)
Tips to reduce freezing episodes with standing or walking backwards:  1. Stand tall with your feet wide, so that you can rock and weight shift through your hips. 2. In order to prevent you from taking faster, smaller steps, and losing your balance backwards, STOP, get your posture tall, and RESET your posture and balance.  Take a deep breath before taking the BIG step to start again in the backwards direction. 3.  To walk backwards-count your steps, as you take one step behind the other, until you feel the chair on the back of your legs.  (You can "stick your bottom out" as you back up) 4.  Once you feel the chair on the back of your legs, STOP and stand tall (don't keep walking backwards).  5.  As you prepare to sit, bend at your hips and reach back for the chair, as you slowly lower down into the chair.

## 2017-03-16 ENCOUNTER — Ambulatory Visit: Payer: Medicare Other | Admitting: Physical Therapy

## 2017-03-16 DIAGNOSIS — R2681 Unsteadiness on feet: Secondary | ICD-10-CM | POA: Diagnosis not present

## 2017-03-16 DIAGNOSIS — R2689 Other abnormalities of gait and mobility: Secondary | ICD-10-CM | POA: Diagnosis not present

## 2017-03-16 NOTE — Patient Instructions (Signed)
With Walking Backwards:  -SLOWLY walk backwards, one step at a time until you get to where you are going (You can count out loud as a cue to SLOW down)  -If you have to stop when you are walking backwards, you need to make sure your feet are staggered so that you can better keep your balance

## 2017-03-16 NOTE — Therapy (Signed)
[facility] [address] [place], [place], [address] Phone: [phone]   Fax:  [phone]  Physical Therapy Treatment  Patient Details  Name: Alex Frazier MRN: [id number] Date of Birth: [birth year] Referring Provider: Jenna Luo  Encounter Date: 03/16/2017      PT End of Session - 03/16/17 1711    Visit Number 5   Number of Visits 9   Date for PT Re-Evaluation 04/13/17   Authorization Type Medicare Traditional primary, BCBS 2nd-GCODE every 10th visit   PT Start Time 1020   PT Stop Time 1102   PT Time Calculation (min) 42 min   Equipment Utilized During Treatment Gait belt   Activity Tolerance Patient tolerated treatment well   Behavior During Therapy WFL for tasks assessed/performed      Past Medical History:  Diagnosis Date  . B12 deficiency    Borderline  . Bulging discs   . Gastritis   . Hyperlipidemia   . Neuropathy   . Parkinsonian syndrome ([place])   . Peripheral neuropathy     Past Surgical History:  Procedure Laterality Date  . APPENDECTOMY    . BACK SURGERY      There were no vitals filed for this visit.      Subjective Assessment - 03/16/17 1025    Subjective No pain, no falls, no changes since last week.  Interested in [employer], and wife will pursue.   Patient is accompained by: Family member  wife   Pertinent History bulging discs, neuropathy, Parkinsonism, L shoulder pain from recent falls   Patient Stated Goals Pt's goals for therapy (wife reports he just doesn't want to be here).  Wife's goal is to be safer with walker.   Currently in Pain? No/denies   Pain Onset More than a month ago                         [facility] Adult PT Treatment/Exercise - 03/16/17 1032      Transfers   Transfers Sit to Stand;Stand to Sit   Sit to Stand 5: Supervision;With upper extremity assist;From chair/3-in-1   Stand to Sit 5: Supervision;With upper extremity assist;To chair/3-in-1    Number of Reps 1 set  5 reps   Transfer Cueing Upon standing-attempted standing without UE support-2 attempts <3 seconds, then 11.15 second on 3rd attempt   Comments Cues for sidestepping to maneuver rollator<>table/chair, with supervision.     Ambulation/Gait   Ambulation/Gait Yes   Ambulation/Gait Assistance 5: Supervision;4: Min guard   Ambulation/Gait Assistance Details Trial of U-Step RW, with cues to stay close within walker's BOS.   Ambulation Distance (Feet) 150 Feet  x 2 U-step RW, 200 ft rollator   Assistive device Rollator  U-Step RW   Gait Pattern Step-through pattern;Decreased step length - right;Decreased step length - left;Decreased hip/knee flexion - right;Decreased hip/knee flexion - left;Decreased dorsiflexion - right;Decreased dorsiflexion - left;Poor foot clearance - left;Poor foot clearance - right   Ambulation Surface Level;Indoor   Gait velocity 15.87 sec = 2.07 ft/sec with rollator; 16.97 sec (1.89 ft/sec) with U-Step RW   Pre-Gait Activities Forward/backward walking with U-step RW, with cues for slowed counting out loud during retro walking, with cues to end retro walking in stagger stance and return to upright posture to avoid LOB posteriorly (U-step helps pt regain balance).  Also performed forward/back walking with pt's rollator walker, with cues for slowed steps in retro direction, cues for stagger stance and  return to upright posture.  Pt has narrowed BOS and LOB posteriorly and rollator's front wheels come up from the ground, with PT's assistance to regain balance.     Standardized Balance Assessment   Standardized Balance Assessment Timed Up and Go Test     Timed Up and Go Test   TUG Normal TUG   Normal TUG (seconds) 31.25     Self-Care   Self-Care Other Self-Care Comments   Other Self-Care Comments  Provided patient's wife with contact information for [facility] to follow up about insurance and how to obtain U-step RW.  Pt's wife unsure if they  can pursue if insurance will not cover.  Discussed potential benefits of U-step RW, including ability to have improved control in posterior direction as well as improved stability with gait and transfers.  Discussed POC (no further appt scheduled, but weeks remain in current POC).  Pt and wife agree to hold on PT for now until they can decide if they will pursue U-step RW and possibly return to PT for additional training for U-step.  Reiterated safety with transfers, initiation of gait, and short distance backward walking for transfers, with wife verbalizing that pt does not always carryover education from PT sessions at home regarding these things.                PT Education - 03/16/17 1710    Education provided Yes   Education Details Safety with walking backwards (short distances for transfers) with his current walker; also-see self care   Person(s) Educated Patient;Spouse   Methods Explanation;Demonstration;Verbal cues;Handout   Comprehension Verbalized understanding;Returned demonstration;Verbal cues required          PT Short Term Goals - 03/16/17 1026      PT SHORT TERM GOAL #1   Title Pt will perform HEP with wife's supervision for improved balance and gait.  TARGET 03/13/17   Time 4   Period Weeks   Status Unable to assess  due to time constraints     PT SHORT TERM GOAL #2   Title Pt will be able to stand at least 1 minute without UE support for improved standing balance.   Baseline 11.15 sec at best, 3rd attempt   Time 4   Period Weeks   Status Not Met     PT SHORT TERM GOAL #3   Title Pt will perform at least 4 of 5 reps of sit<>stand transfers with minimal UE support, modified independently for improved transfer safety and efficiency.   Baseline able to perform 5 transfers iwth minimal UE support; needs supervision and cues   Time 4   Period Weeks   Status Partially Met     PT SHORT TERM GOAL #4   Title Pt will improve TUG score to less than or equal to 18  seconds for decreased fall risk.   Time 4   Period Weeks   Status Not Met     PT SHORT TERM GOAL #5   Title Pt will verbalize understanding of fall prevention in home environment.   Baseline with wife's understanding   Time 4   Period Weeks   Status Achieved           PT Long Term Goals - 02/12/17 1217      PT LONG TERM GOAL #1   Title Pt will improve Berg Balance test to at least 20/56 for decreased fall risk.   Time 8   Period Weeks   Status New  PT LONG TERM GOAL #2   Title Pt will perform at least 3 minutes of standing at home with intermittent UE support, for improved participation in ADLs.   Time 8   Period Weeks   Status New     PT LONG TERM GOAL #3   Title Pt will improve gait velocity to at least 2.4 ft/sec for improved gait efficiency and safety.   Time 8   Period Weeks   Status New               Plan - 03/16/17 1716    Clinical Impression Statement STGs assessed this visit, with pt not meeting STG 2, 4.  STG 3 partially met and STG 5 met.  STG 1 not assessed due to time constraints.  Pt/wife appear interested in pursuing U-step rolling walker, due to the stability it provides for gait and transfers as well in posterior direction for short distances.  However, wife does voice concerns regarding financial issues regarding walker, but she will pursue options through company.  Will place patient on hold awaiting if they will get U-step RW, for additional training with the device.   Rehab Potential Fair   Clinical Impairments Affecting Rehab Potential Pt's stated not wanting to continue with therapy; wife present to help carryover safety recommendations; pt agreeable to 1x/wk   PT Frequency 1x / week   PT Duration 8 weeks  plus eval   PT Treatment/Interventions ADLs/Self Care Home Management;Functional mobility training;Gait training;DME Instruction;Neuromuscular re-education;Patient/family education;Balance training;Therapeutic exercise;Therapeutic  activities   PT Next Visit Plan Place on hold; if pt decides to obtain U-step RW, additional gait and transfer training with U-step   Consulted and Agree with Plan of Care Patient;Family member/caregiver   Family Member Consulted wife      Patient will benefit from skilled therapeutic intervention in order to improve the following deficits and impairments:  Abnormal gait, Decreased balance, Decreased mobility, Decreased safety awareness, Decreased strength, Difficulty walking, Postural dysfunction, Impaired flexibility  Visit Diagnosis: Other abnormalities of gait and mobility  Unsteadiness on feet     Problem List Patient Active Problem List   Diagnosis Date Noted  . DDD (degenerative disc disease), cervical 04/07/2016  . Neuropathy   . Bulging discs   . Gastritis   . Parkinsonian syndrome ([place])   . Hyperlipidemia     Ah Bott W. 03/16/2017, 5:23 PM Frazier Butt., PT [facility] [address] [place], [place], [address] Phone: [phone]   Fax:  [phone]  Name: Alex Frazier MRN: [id number] Date of Birth: [birth year]

## 2017-05-29 ENCOUNTER — Encounter: Payer: Self-pay | Admitting: Physical Therapy

## 2017-05-29 NOTE — Therapy (Signed)
[facility] [address] [place], [place], [address] Phone: [phone]   Fax:  [phone]  Patient Details  Name: Alex Frazier MRN: [id number] Date of Birth: Jun 08, 1946 Referring Provider:  No ref. provider found  Encounter Date: 2017-06-02    Addendum: G Codes Functional Assessment Tool: TUG 31.25 sec, gait velocity 1.89 ft/sec with trial of U-step RW, 2.07 ft/sec with RW (measures taken last visit 03/16/17) Functional Limitation: Mobility: Walking and Moving Around Goal Status  [phone]): CJ Discharge status:  CK  PHYSICAL THERAPY DISCHARGE SUMMARY  Visits from Start of Care: 5  Current functional level related to goals / functional outcomes: See STG measures; LTGs not assessed, due to pt not returning after 7.9.18 visit   Remaining deficits: Balance, decreased safety awareness, gait difficulty, decreased strength   Education / Equipment: Educated in safety, tips to reduce falls within home-educated patient and wife; educated in potential benefits of U-step RW-pt was placed on hold to pursue that walker.  Spoke via telephone to wife Jun 02, 2017, and she reports they have not gotten the walker, and patient is no longer interested in coming into therapy.  Plan: Patient agrees to discharge.  Patient goals were not met. Patient is being discharged due to the patient's request.  ?????        Krista Godsil W. 2017-06-02, 9:54 AM  Frazier Butt., PT  [facility] [address] [place], [place], [address] Phone: [phone]   Fax:  [phone]

## 2017-06-25 ENCOUNTER — Ambulatory Visit (INDEPENDENT_AMBULATORY_CARE_PROVIDER_SITE_OTHER): Payer: Medicare Other | Admitting: *Deleted

## 2017-06-25 DIAGNOSIS — Z23 Encounter for immunization: Secondary | ICD-10-CM

## 2017-08-10 ENCOUNTER — Other Ambulatory Visit: Payer: Self-pay | Admitting: Family Medicine

## 2017-08-10 MED ORDER — DULOXETINE HCL 60 MG PO CPEP: 60.0000 mg | ORAL_CAPSULE | Freq: Every day | ORAL | 3 refills | Status: DC

## 2017-08-11 DIAGNOSIS — H524 Presbyopia: Secondary | ICD-10-CM | POA: Diagnosis not present

## 2017-08-11 DIAGNOSIS — H25013 Cortical age-related cataract, bilateral: Secondary | ICD-10-CM | POA: Diagnosis not present

## 2017-08-11 DIAGNOSIS — H2513 Age-related nuclear cataract, bilateral: Secondary | ICD-10-CM | POA: Diagnosis not present

## 2017-08-11 DIAGNOSIS — H43813 Vitreous degeneration, bilateral: Secondary | ICD-10-CM | POA: Diagnosis not present

## 2017-08-13 ENCOUNTER — Other Ambulatory Visit: Payer: Self-pay

## 2017-08-18 ENCOUNTER — Ambulatory Visit: Payer: Medicare Other | Admitting: Family Medicine

## 2017-08-25 ENCOUNTER — Ambulatory Visit (INDEPENDENT_AMBULATORY_CARE_PROVIDER_SITE_OTHER): Payer: Medicare Other | Admitting: Family Medicine

## 2017-08-25 ENCOUNTER — Encounter: Payer: Self-pay | Admitting: Family Medicine

## 2017-08-25 VITALS — BP 126/68 | HR 78 | Temp 98.1°F | Resp 12 | Ht 68.0 in | Wt 154.0 lb

## 2017-08-25 DIAGNOSIS — G629 Polyneuropathy, unspecified: Secondary | ICD-10-CM | POA: Diagnosis not present

## 2017-08-25 DIAGNOSIS — E7800 Pure hypercholesterolemia, unspecified: Secondary | ICD-10-CM

## 2017-08-25 DIAGNOSIS — Z125 Encounter for screening for malignant neoplasm of prostate: Secondary | ICD-10-CM

## 2017-08-25 DIAGNOSIS — G20 Parkinson's disease: Secondary | ICD-10-CM

## 2017-08-25 NOTE — Progress Notes (Signed)
Subjective:    Patient ID: Alex Frazier, male    DOB: [birth year], 71 y.o.   MRN: [id number]  + +    Medication Refill    Patient is here today for a checkup.  He is due for a prostate exam along with a PSA.  Due for colonoscopy in 2020.  He continues to battle Parkinson disease.  Balance continues to deteriorate.  Denies memory loss or confusion.  Denies chest pain, sob, doe.  Denies myalgias or RUQ pain.  Immunizations are up to date.  Has tried PT but is not doing the exercises at home.  Denies any recent fall or injury.    Past Medical History:  Diagnosis Date  . B12 deficiency    Borderline  . Bulging discs   . Gastritis   . Hyperlipidemia   . Neuropathy   . Parkinsonian syndrome ([place])   . Peripheral neuropathy    Current Outpatient Medications on File Prior to Visit  Medication Sig Dispense Refill  . aspirin 325 MG tablet Take 325 mg by mouth daily.      . carbidopa-levodopa (SINEMET IR) 25-100 MG tablet 1.5 tablets 3 (three) times daily.     . Carbidopa-Levodopa ER (SINEMET CR) 25-100 MG tablet controlled release Take 1 tablet by mouth at bedtime.  2  . DULoxetine (CYMBALTA) 60 MG capsule Take 1 capsule (60 mg total) by mouth daily. 90 capsule 3  . Fish Oil-Cholecalciferol (OMEGA-3 FISH OIL/VITAMIN D3) 1000-1000 MG-UNIT CAPS Take by mouth.    . fluticasone (FLONASE) 50 MCG/ACT nasal spray Place 2 sprays into both nostrils daily. 16 g 6  . Polyethylene Glycol 3350 (MIRALAX PO) Take by mouth.    . pramipexole (MIRAPEX) 0.125 MG tablet Take 0.125 mg by mouth 3 (three) times daily.    . pravastatin (PRAVACHOL) 20 MG tablet TAKE 1 TABLET BY MOUTH DAILY AT 6PM 90 tablet 1  . saw palmetto 500 MG capsule Take 500 mg by mouth daily.     No current facility-administered medications on file prior to visit.    Allergies  Allergen Reactions  . Indomethacin   . Lyrica [Pregabalin]    Social History   Socioeconomic History  . Marital status: Married    Spouse name: Not on  file  . Number of children: Not on file  . Years of education: Not on file  . Highest education level: Not on file  Social Needs  . Financial resource strain: Not on file  . Food insecurity - worry: Not on file  . Food insecurity - inability: Not on file  . Transportation needs - medical: Not on file  . Transportation needs - non-medical: Not on file  Occupational History  . Not on file  Tobacco Use  . Smoking status: Never Smoker  . Smokeless tobacco: Never Used  Substance and Sexual Activity  . Alcohol use: No  . Drug use: No  . Sexual activity: Not Currently  Other Topics Concern  . Not on file  Social History Narrative  . Not on file     Review of Systems  All other systems reviewed and are negative.      Objective:   Physical Exam  Constitutional: He appears well-developed and well-nourished.  HENT:  Nose: Nose normal.  Mouth/Throat: Oropharynx is clear and moist.  Eyes: Conjunctivae are normal. No scleral icterus.  Neck: Neck supple. No JVD present. No thyromegaly present.  Cardiovascular: Normal rate and regular rhythm. Exam reveals no friction rub.  No  murmur heard. Pulmonary/Chest: Effort normal and breath sounds normal. No respiratory distress. He has no wheezes. He has no rales.  Abdominal: Soft. Bowel sounds are normal. He exhibits no distension. There is no tenderness. There is no rebound and no guarding.  Musculoskeletal: He exhibits no edema.  Lymphadenopathy:    He has no cervical adenopathy.  Skin: Skin is warm. No rash noted. No erythema. No pallor.  Vitals reviewed.   Patient has bradykinesia. He has no resting tremor. Has a slow shuffling gait and walks with a four-pronged cane.    Assessment & Plan:  Pure hypercholesterolemia  Neuropathy  Primary Parkinsonism ([place])  Prostate cancer screening - Plan: PSA  Weight loss is stable.  Immynizations are UTD.  Check PSA.  CHeck cbc, cmp, flp.  Consider cologuard when colonoscopy is due in 2020.

## 2017-08-26 LAB — PSA: PSA: 1 ng/mL (ref <=4.0)

## 2017-08-28 DIAGNOSIS — G20 Parkinson's disease: Secondary | ICD-10-CM | POA: Diagnosis not present

## 2017-08-28 DIAGNOSIS — G249 Dystonia, unspecified: Secondary | ICD-10-CM | POA: Diagnosis not present

## 2017-08-28 DIAGNOSIS — G629 Polyneuropathy, unspecified: Secondary | ICD-10-CM | POA: Diagnosis not present

## 2017-08-28 DIAGNOSIS — Z79899 Other long term (current) drug therapy: Secondary | ICD-10-CM | POA: Diagnosis not present

## 2017-08-28 DIAGNOSIS — R296 Repeated falls: Secondary | ICD-10-CM | POA: Diagnosis not present

## 2017-10-28 ENCOUNTER — Encounter: Payer: Self-pay | Admitting: Family Medicine

## 2017-10-28 DIAGNOSIS — E785 Hyperlipidemia, unspecified: Secondary | ICD-10-CM

## 2017-11-02 MED ORDER — PRAVASTATIN SODIUM 20 MG PO TABS: ORAL_TABLET | ORAL | 1 refills | Status: DC

## 2017-12-31 ENCOUNTER — Ambulatory Visit (INDEPENDENT_AMBULATORY_CARE_PROVIDER_SITE_OTHER): Payer: Medicare Other | Admitting: Family Medicine

## 2017-12-31 ENCOUNTER — Encounter: Payer: Self-pay | Admitting: Family Medicine

## 2017-12-31 VITALS — BP 112/62 | HR 80 | Temp 97.7°F | Resp 12 | Ht 68.0 in | Wt 157.0 lb

## 2017-12-31 DIAGNOSIS — G20 Parkinson's disease: Secondary | ICD-10-CM

## 2017-12-31 DIAGNOSIS — F22 Delusional disorders: Secondary | ICD-10-CM

## 2017-12-31 DIAGNOSIS — R443 Hallucinations, unspecified: Secondary | ICD-10-CM

## 2017-12-31 LAB — COMPLETE METABOLIC PANEL WITH GFR
AG RATIO: 1.8 (calc) (ref 1.0–2.5)
ALBUMIN MSPROF: 4.2 g/dL (ref 3.6–5.1)
ALT: 7 U/L — AB (ref 9–46)
AST: 23 U/L (ref 10–35)
Alkaline phosphatase (APISO): 105 U/L (ref 40–115)
BUN: 19 mg/dL (ref 7–25)
CALCIUM: 9.3 mg/dL (ref 8.6–10.3)
CO2: 29 mmol/L (ref 20–32)
Chloride: 105 mmol/L (ref 98–110)
Creat: 0.82 mg/dL (ref 0.70–1.18)
GFR, EST AFRICAN AMERICAN: 103 mL/min/{1.73_m2} (ref >=60)
GFR, EST NON AFRICAN AMERICAN: 89 mL/min/{1.73_m2} (ref >=60)
Globulin: 2.4 g/dL (calc) (ref 1.9–3.7)
Glucose, Bld: 117 mg/dL — ABNORMAL HIGH (ref 65–99)
POTASSIUM: 4.6 mmol/L (ref 3.5–5.3)
Sodium: 139 mmol/L (ref 135–146)
TOTAL PROTEIN: 6.6 g/dL (ref 6.1–8.1)
Total Bilirubin: 0.6 mg/dL (ref 0.2–1.2)

## 2017-12-31 LAB — URINALYSIS, ROUTINE W REFLEX MICROSCOPIC
BACTERIA UA: NONE SEEN /HPF
Bilirubin Urine: NEGATIVE
Glucose, UA: NEGATIVE
Hgb urine dipstick: NEGATIVE
Leukocytes, UA: NEGATIVE
Nitrite: NEGATIVE
RBC / HPF: NONE SEEN /HPF (ref 0–2)
SQUAMOUS EPITHELIAL / LPF: NONE SEEN /HPF (ref <=5)
Specific Gravity, Urine: 1.028 (ref 1.001–1.03)
pH: 5 (ref 5.0–8.0)

## 2017-12-31 LAB — CBC WITH DIFFERENTIAL/PLATELET
BASOS ABS: 28 {cells}/uL (ref 0–200)
Basophils Relative: 0.6 %
EOS ABS: 19 {cells}/uL (ref 15–500)
Eosinophils Relative: 0.4 %
HEMATOCRIT: 41.2 % (ref 38.5–50.0)
HEMOGLOBIN: 14 g/dL (ref 13.2–17.1)
Lymphs Abs: 1123 cells/uL (ref 850–3900)
MCH: 29.2 pg (ref 27.0–33.0)
MCHC: 34 g/dL (ref 32.0–36.0)
MCV: 86 fL (ref 80.0–100.0)
MONOS PCT: 9.9 %
MPV: 10.1 fL (ref 7.5–12.5)
NEUTROS ABS: 3064 {cells}/uL (ref 1500–7800)
Neutrophils Relative %: 65.2 %
Platelets: 234 10*3/uL (ref 140–400)
RBC: 4.79 10*6/uL (ref 4.20–5.80)
RDW: 12.8 % (ref 11.0–15.0)
Total Lymphocyte: 23.9 %
WBC mixed population: 465 cells/uL (ref 200–950)
WBC: 4.7 10*3/uL (ref 3.8–10.8)

## 2017-12-31 LAB — MICROSCOPIC MESSAGE

## 2017-12-31 NOTE — Progress Notes (Signed)
Subjective:    Patient ID: Alex Frazier, male    DOB: [birth year], 72 y.o.   MRN: [id number]  Over the last 2 months, the patient's wife states that he is constantly picking at his skin.  He is convinced that he sees bugs crawling on his skin.  He can feel the bugs crawling below the skin.  Today he brings in a napkin full bits and pieces of skin convinced that there are insects and parasites inside the napkin.  Wife denies any symptoms of infection.  She denies any fevers or chills cough, nausea, vomiting, diarrhea.  There is no unilateral neurologic deficit suggesting underlying CVA.  They deny any head injury.  There have been no recent medication changes.  The remainder of his review of systems is negative other than for fixed delusions of parasitosis and also visual hallucinations. Past Medical History:  Diagnosis Date  . B12 deficiency    Borderline  . Bulging discs   . Gastritis   . Hyperlipidemia   . Neuropathy   . Parkinsonian syndrome ([place])   . Peripheral neuropathy    Current Outpatient Medications on File Prior to Visit  Medication Sig Dispense Refill  . carbidopa-levodopa (SINEMET IR) 25-100 MG tablet 1.5 tablets 3 (three) times daily.     . Carbidopa-Levodopa ER (SINEMET CR) 25-100 MG tablet controlled release Take 1 tablet by mouth at bedtime.  2  . DULoxetine (CYMBALTA) 60 MG capsule Take 1 capsule (60 mg total) by mouth daily. 90 capsule 3  . Fish Oil-Cholecalciferol (OMEGA-3 FISH OIL/VITAMIN D3) 1000-1000 MG-UNIT CAPS Take by mouth.    . fluticasone (FLONASE) 50 MCG/ACT nasal spray Place 2 sprays into both nostrils daily. 16 g 6  . Polyethylene Glycol 3350 (MIRALAX PO) Take by mouth.    . pramipexole (MIRAPEX) 0.125 MG tablet Take 0.125 mg by mouth 3 (three) times daily.    . pravastatin (PRAVACHOL) 20 MG tablet TAKE 1 TABLET BY MOUTH DAILY AT 6PM 90 tablet 1  . saw palmetto 500 MG capsule Take 500 mg by mouth daily.     No current facility-administered medications on  file prior to visit.    Allergies  Allergen Reactions  . Indomethacin   . Lyrica [Pregabalin]    Social History   Socioeconomic History  . Marital status: Married    Spouse name: Not on file  . Number of children: Not on file  . Years of education: Not on file  . Highest education level: Not on file  Occupational History  . Not on file  Social Needs  . Financial resource strain: Not on file  . Food insecurity:    Worry: Not on file    Inability: Not on file  . Transportation needs:    Medical: Not on file    Non-medical: Not on file  Tobacco Use  . Smoking status: Never Smoker  . Smokeless tobacco: Never Used  Substance and Sexual Activity  . Alcohol use: No  . Drug use: No  . Sexual activity: Not Currently  Lifestyle  . Physical activity:    Days per week: Not on file    Minutes per session: Not on file  . Stress: Not on file  Relationships  . Social connections:    Talks on phone: Not on file    Gets together: Not on file    Attends religious service: Not on file    Active member of club or organization: Not on file    Attends meetings  of clubs or organizations: Not on file    Relationship status: Not on file  . Intimate partner violence:    Fear of current or ex partner: Not on file    Emotionally abused: Not on file    Physically abused: Not on file    Forced sexual activity: Not on file  Other Topics Concern  . Not on file  Social History Narrative  . Not on file     Review of Systems  All other systems reviewed and are negative.      Objective:   Physical Exam  Constitutional: He appears well-developed and well-nourished.  HENT:  Nose: Nose normal.  Mouth/Throat: Oropharynx is clear and moist.  Eyes: Conjunctivae are normal. No scleral icterus.  Neck: Neck supple. No JVD present. No thyromegaly present.  Cardiovascular: Normal rate and regular rhythm. Exam reveals no friction rub.  No murmur heard. Pulmonary/Chest: Effort normal and breath  sounds normal. No respiratory distress. He has no wheezes. He has no rales.  Abdominal: Soft. Bowel sounds are normal. He exhibits no distension. There is no tenderness. There is no rebound and no guarding.  Musculoskeletal: He exhibits no edema.  Lymphadenopathy:    He has no cervical adenopathy.  Skin: Skin is warm. No rash noted. No erythema. No pallor.  Vitals reviewed.   Patient has bradykinesia. He has no resting tremor. Has a slow shuffling gait and walks with a four-pronged cane.    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  Check CBC to evaluate for leukocytosis.  Check CMP to evaluate for electrolyte abnormalities.  However I believe hallucinations are likely due to a combination of medication such as dopamine agonist, underlying Parkinson's disease.  Rather than start the patient on a antipsychotic medication such as Seroquel, Haldol, Risperdal, I will decrease his dopamine agonist and discontinue Mirapex.  Recheck in 1 week.  Consider decreasing his dose of Sinemet however if we need to do this, I will run it by his neurologist first.

## 2018-01-01 ENCOUNTER — Encounter (INDEPENDENT_AMBULATORY_CARE_PROVIDER_SITE_OTHER): Payer: Self-pay

## 2018-01-07 ENCOUNTER — Ambulatory Visit: Payer: Medicare Other | Admitting: Family Medicine

## 2018-01-08 ENCOUNTER — Encounter: Payer: Self-pay | Admitting: Family Medicine

## 2018-01-08 ENCOUNTER — Ambulatory Visit (INDEPENDENT_AMBULATORY_CARE_PROVIDER_SITE_OTHER): Payer: Medicare Other | Admitting: Family Medicine

## 2018-01-08 VITALS — BP 120/60 | HR 82 | Temp 97.7°F | Resp 16 | Ht 68.0 in | Wt 155.0 lb

## 2018-01-08 DIAGNOSIS — F22 Delusional disorders: Secondary | ICD-10-CM

## 2018-01-08 DIAGNOSIS — G20 Parkinson's disease: Secondary | ICD-10-CM | POA: Diagnosis not present

## 2018-01-08 DIAGNOSIS — R443 Hallucinations, unspecified: Secondary | ICD-10-CM | POA: Diagnosis not present

## 2018-01-08 DIAGNOSIS — G20A1 Parkinson's disease without dyskinesia, without mention of fluctuations: Secondary | ICD-10-CM

## 2018-01-08 NOTE — Progress Notes (Signed)
Subjective:    Patient ID: Alex Frazier, male    DOB: 1946/05/10, 72 y.o.   MRN: [id number] 12/31/17 Over the last 2 months, the patient's wife states that he is constantly picking at his skin.  He is convinced that he sees bugs crawling on his skin.  He can feel the bugs crawling below the skin.  Today he brings in a napkin full bits and pieces of skin convinced that there are insects and parasites inside the napkin.  Wife denies any symptoms of infection.  She denies any fevers or chills cough, nausea, vomiting, diarrhea.  There is no unilateral neurologic deficit suggesting underlying CVA.  They deny any head injury.  There have been no recent medication changes.  The remainder of his review of systems is negative other than for fixed delusions of parasitosis and also visual hallucinations.  At that time, my plan was: Obtain urinalysis to evaluate for occult infection.  Check CBC to evaluate for leukocytosis.  Check CMP to evaluate for electrolyte abnormalities.  However I believe hallucinations are likely due to a combination of medication such as dopamine agonist, underlying Parkinson's disease.  Rather than start the patient on a antipsychotic medication such as Seroquel, Haldol, Risperdal, I will decrease his dopamine agonist and discontinue Mirapex.  Recheck in 1 week.  Consider decreasing his dose of Sinemet however if we need to do this, I will run it by his neurologist first.  01/08/18 Patient states that the hallucinations have stopped.  Both he and his wife deny any further hallucinations of insects crawling on his skin.  He is not picking at his skin.  He denies feeling anything crawling on his skin since discontinuing the Mirapex.  Thankfully, neither the patient or his wife have noticed any negative effects regarding his balance or gait or parkinsonian features since discontinuing Mirapex.  He continues to have masslike affect, bradykinesia, and shuffling gait however there has been no  perceptible change since the medication was discontinued. Past Medical History:  Diagnosis Date  . B12 deficiency    Borderline  . Bulging discs   . Gastritis   . Hyperlipidemia   . Neuropathy   . Parkinsonian syndrome ([place])   . Peripheral neuropathy    Current Outpatient Medications on File Prior to Visit  Medication Sig Dispense Refill  . carbidopa-levodopa (SINEMET IR) 25-100 MG tablet 1.5 tablets 3 (three) times daily.     . Carbidopa-Levodopa ER (SINEMET CR) 25-100 MG tablet controlled release Take 1 tablet by mouth at bedtime.  2  . DULoxetine (CYMBALTA) 60 MG capsule Take 1 capsule (60 mg total) by mouth daily. 90 capsule 3  . Fish Oil-Cholecalciferol (OMEGA-3 FISH OIL/VITAMIN D3) 1000-1000 MG-UNIT CAPS Take by mouth.    . fluticasone (FLONASE) 50 MCG/ACT nasal spray Place 2 sprays into both nostrils daily. 16 g 6  . Polyethylene Glycol 3350 (MIRALAX PO) Take by mouth.    . pramipexole (MIRAPEX) 0.125 MG tablet Take 0.125 mg by mouth 3 (three) times daily.    . pravastatin (PRAVACHOL) 20 MG tablet TAKE 1 TABLET BY MOUTH DAILY AT 6PM 90 tablet 1  . saw palmetto 500 MG capsule Take 500 mg by mouth daily.     No current facility-administered medications on file prior to visit.    Allergies  Allergen Reactions  . Indomethacin   . Lyrica [Pregabalin]    Social History   Socioeconomic History  . Marital status: Married    Spouse name: Not on file  .  Number of children: Not on file  . Years of education: Not on file  . Highest education level: Not on file  Occupational History  . Not on file  Social Needs  . Financial resource strain: Not on file  . Food insecurity:    Worry: Not on file    Inability: Not on file  . Transportation needs:    Medical: Not on file    Non-medical: Not on file  Tobacco Use  . Smoking status: Never Smoker  . Smokeless tobacco: Never Used  Substance and Sexual Activity  . Alcohol use: No  . Drug use: No  . Sexual activity: Not  Currently  Lifestyle  . Physical activity:    Days per week: Not on file    Minutes per session: Not on file  . Stress: Not on file  Relationships  . Social connections:    Talks on phone: Not on file    Gets together: Not on file    Attends religious service: Not on file    Active member of club or organization: Not on file    Attends meetings of clubs or organizations: Not on file    Relationship status: Not on file  . Intimate partner violence:    Fear of current or ex partner: Not on file    Emotionally abused: Not on file    Physically abused: Not on file    Forced sexual activity: Not on file  Other Topics Concern  . Not on file  Social History Narrative  . Not on file     Review of Systems  All other systems reviewed and are negative.      Objective:   Physical Exam  Constitutional: He appears well-developed and well-nourished.  HENT:  Nose: Nose normal.  Mouth/Throat: Oropharynx is clear and moist.  Eyes: Conjunctivae are normal. No scleral icterus.  Neck: Neck supple. No JVD present. No thyromegaly present.  Cardiovascular: Normal rate and regular rhythm. Exam reveals no friction rub.  No murmur heard. Pulmonary/Chest: Effort normal and breath sounds normal. No respiratory distress. He has no wheezes. He has no rales.  Abdominal: Soft. Bowel sounds are normal. He exhibits no distension. There is no tenderness. There is no rebound and no guarding.  Musculoskeletal: He exhibits no edema.  Lymphadenopathy:    He has no cervical adenopathy.  Skin: Skin is warm. No rash noted. No erythema. No pallor.  Vitals reviewed.   Patient has bradykinesia. He has no resting tremor. Has a slow shuffling gait and walks with a four-pronged cane.    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  Stay off Mirapex at the present  time but continue Sinemet until follow-up with neurology in August.  If the patient begins to have more issues with his balance, worsening bradykinesia, worsening tremor, consider resuming a low-dose Mirapex.  Patient and wife will monitor symptoms closely.

## 2018-02-23 DIAGNOSIS — Z79899 Other long term (current) drug therapy: Secondary | ICD-10-CM | POA: Diagnosis not present

## 2018-02-23 DIAGNOSIS — G9009 Other idiopathic peripheral autonomic neuropathy: Secondary | ICD-10-CM | POA: Diagnosis not present

## 2018-02-23 DIAGNOSIS — G20 Parkinson's disease: Secondary | ICD-10-CM | POA: Diagnosis not present

## 2018-02-23 DIAGNOSIS — G6289 Other specified polyneuropathies: Secondary | ICD-10-CM | POA: Diagnosis not present

## 2018-02-23 DIAGNOSIS — Z820 Family history of epilepsy and other diseases of the nervous system: Secondary | ICD-10-CM | POA: Diagnosis not present

## 2018-02-23 DIAGNOSIS — Z8349 Family history of other endocrine, nutritional and metabolic diseases: Secondary | ICD-10-CM | POA: Diagnosis not present

## 2018-03-04 ENCOUNTER — Ambulatory Visit: Payer: Medicare Other | Admitting: Family Medicine

## 2018-03-22 ENCOUNTER — Ambulatory Visit (INDEPENDENT_AMBULATORY_CARE_PROVIDER_SITE_OTHER): Payer: Medicare Other | Admitting: Family Medicine

## 2018-03-22 ENCOUNTER — Encounter: Payer: Self-pay | Admitting: Family Medicine

## 2018-03-22 VITALS — BP 120/68 | HR 74 | Temp 97.9°F | Resp 14 | Ht 68.0 in | Wt 157.0 lb

## 2018-03-22 DIAGNOSIS — R6889 Other general symptoms and signs: Secondary | ICD-10-CM | POA: Diagnosis not present

## 2018-03-22 DIAGNOSIS — F22 Delusional disorders: Secondary | ICD-10-CM

## 2018-03-22 DIAGNOSIS — G20 Parkinson's disease: Secondary | ICD-10-CM

## 2018-03-22 DIAGNOSIS — G629 Polyneuropathy, unspecified: Secondary | ICD-10-CM

## 2018-03-22 DIAGNOSIS — E7800 Pure hypercholesterolemia, unspecified: Secondary | ICD-10-CM | POA: Diagnosis not present

## 2018-03-22 NOTE — Progress Notes (Signed)
Subjective:    Patient ID: Alex Frazier, male    DOB: 1946/06/13, 72 y.o.   MRN: [id number] 12/31/17 Over the last 2 months, the patient's wife states that he is constantly picking at his skin.  He is convinced that he sees bugs crawling on his skin.  He can feel the bugs crawling below the skin.  Today he brings in a napkin full bits and pieces of skin convinced that there are insects and parasites inside the napkin.  Wife denies any symptoms of infection.  She denies any fevers or chills cough, nausea, vomiting, diarrhea.  There is no unilateral neurologic deficit suggesting underlying CVA.  They deny any head injury.  There have been no recent medication changes.  The remainder of his review of systems is negative other than for fixed delusions of parasitosis and also visual hallucinations.  At that time, my plan was: Obtain urinalysis to evaluate for occult infection.  Check CBC to evaluate for leukocytosis.  Check CMP to evaluate for electrolyte abnormalities.  However I believe hallucinations are likely due to a combination of medication such as dopamine agonist, underlying Parkinson's disease.  Rather than start the patient on a antipsychotic medication such as Seroquel, Haldol, Risperdal, I will decrease his dopamine agonist and discontinue Mirapex.  Recheck in 1 week.  Consider decreasing his dose of Sinemet however if we need to do this, I will run it by his neurologist first.  01/08/18 Patient states that the hallucinations have stopped.  Both he and his wife deny any further hallucinations of insects crawling on his skin.  He is not picking at his skin.  He denies feeling anything crawling on his skin since discontinuing the Mirapex.  Thankfully, neither the patient or his wife have noticed any negative effects regarding his balance or gait or parkinsonian features since discontinuing Mirapex.  He continues to have masslike affect, bradykinesia, and shuffling gait however there has been no  perceptible change since the medication was discontinued.  At that time, my plan was: Hallucinations and delusions of parasitosis have subsided since discontinuation of the medication.  Stay off Mirapex at the present time but continue Sinemet until follow-up with neurology in August.  If the patient begins to have more issues with his balance, worsening bradykinesia, worsening tremor, consider resuming a low-dose Mirapex.  Patient and wife will monitor symptoms closely.  03/22/18 From the standpoint of his delusions of parasitosis, he is doing much better off Mirapex.  He no longer complains of bugs crawling on his skin or bugs burrowing below his skin.  He has had significant increase in his symptoms of restless leg however.  He does not wake up at night due to this however his constant flailing keeps his wife awake at times.  At the present time they are not interested in trying other medications to treat restless leg that would not impact his dopamine such as gabapentin or Klonopin.  Overall he is doing okay.  His condition seems stable.  He denies any chest pain shortness of breath or dyspnea on exertion.  He does continue to endorse neuropathy in both legs for which he takes vitamin B12.  He is due to him check a vitamin B12 level.  He also complains of arthritic pain in his right hand at the fifth MCP joint and fifth PIP joint.  He is not currently taking any NSAID Past Medical History:  Diagnosis Date  . B12 deficiency    Borderline  . Bulging discs   .  Gastritis   . Hyperlipidemia   . Neuropathy   . Parkinsonian syndrome ([place])   . Peripheral neuropathy    Current Outpatient Medications on File Prior to Visit  Medication Sig Dispense Refill  . Alpha-Lipoic Acid 600 MG CAPS Take by mouth.    Marland Kitchen b complex vitamins tablet Take by mouth.    . carbidopa-levodopa (SINEMET IR) 25-100 MG tablet 2.5 tablets 3 (three) times daily.     . Carbidopa-Levodopa ER (SINEMET CR) 25-100 MG tablet controlled  release Take 1 tablet by mouth at bedtime.  2  . DULoxetine (CYMBALTA) 60 MG capsule Take 1 capsule (60 mg total) by mouth daily. 90 capsule 3  . Fish Oil-Cholecalciferol (OMEGA-3 FISH OIL/VITAMIN D3) 1000-1000 MG-UNIT CAPS Take by mouth.    . fluticasone (FLONASE) 50 MCG/ACT nasal spray Place 2 sprays into both nostrils daily. 16 g 6  . Melatonin 10 MG TABS Take by mouth.    . Polyethylene Glycol 3350 (MIRALAX PO) Take by mouth.    . pravastatin (PRAVACHOL) 20 MG tablet TAKE 1 TABLET BY MOUTH DAILY AT 6PM 90 tablet 1  . saw palmetto 500 MG capsule Take 500 mg by mouth daily.     No current facility-administered medications on file prior to visit.    Allergies  Allergen Reactions  . Indomethacin   . Lyrica [Pregabalin]    Social History   Socioeconomic History  . Marital status: Married    Spouse name: Not on file  . Number of children: Not on file  . Years of education: Not on file  . Highest education level: Not on file  Occupational History  . Not on file  Social Needs  . Financial resource strain: Not on file  . Food insecurity:    Worry: Not on file    Inability: Not on file  . Transportation needs:    Medical: Not on file    Non-medical: Not on file  Tobacco Use  . Smoking status: Never Smoker  . Smokeless tobacco: Never Used  Substance and Sexual Activity  . Alcohol use: No  . Drug use: No  . Sexual activity: Not Currently  Lifestyle  . Physical activity:    Days per week: Not on file    Minutes per session: Not on file  . Stress: Not on file  Relationships  . Social connections:    Talks on phone: Not on file    Gets together: Not on file    Attends religious service: Not on file    Active member of club or organization: Not on file    Attends meetings of clubs or organizations: Not on file    Relationship status: Not on file  . Intimate partner violence:    Fear of current or ex partner: Not on file    Emotionally abused: Not on file    Physically  abused: Not on file    Forced sexual activity: Not on file  Other Topics Concern  . Not on file  Social History Narrative  . Not on file     Review of Systems  All other systems reviewed and are negative.      Objective:   Physical Exam  Constitutional: He appears well-developed and well-nourished.  HENT:  Nose: Nose normal.  Mouth/Throat: Oropharynx is clear and moist.  Eyes: Conjunctivae are normal. No scleral icterus.  Neck: Neck supple. No JVD present. No thyromegaly present.  Cardiovascular: Normal rate and regular rhythm. Exam reveals no friction rub.  No murmur heard. Pulmonary/Chest: Effort normal and breath sounds normal. No respiratory distress. He has no wheezes. He has no rales.  Abdominal: Soft. Bowel sounds are normal. He exhibits no distension. There is no tenderness. There is no rebound and no guarding.  Musculoskeletal: He exhibits no edema.  Lymphadenopathy:    He has no cervical adenopathy.  Skin: Skin is warm. No rash noted. No erythema. No pallor.  Vitals reviewed.   Patient has bradykinesia. He has no resting tremor. Has a slow shuffling gait and walks with a four-pronged cane.    Assessment & Plan:  Pure hypercholesterolemia - Plan: CBC with Differential/Platelet, COMPLETE METABOLIC PANEL WITH GFR, Lipid panel  Neuropathy - Plan: Vitamin B12  Delusions of parasitosis ([facility])  Parkinson disease ([place])  There is no visible or palpable deformity in the fifth PIP and MCP joint.  He has full and painless range of motion.  I have recommended a trial of Aleve 500 mg daily for 2 weeks to see if his pain will improve.  If not, proceed with an x-ray.  Blood pressure is well controlled.  Check CBC, CMP, fasting lipid panel.  Given his neuropathy, I will also check a vitamin B12 level.  Prostate cancer screening is up-to-date as his PSA was just checked in December.  Immunizations are up-to-date.  Regular anticipatory guidance is provided.  If restless leg symptoms  worsen, consider trying Klonopin or gabapentin.  He also endorses symptoms of overactive bladder however patient will be a poor candidate for oxybutynin or other anticholinergic medications.  Could consider trying Myrbetriq

## 2018-03-23 ENCOUNTER — Encounter: Payer: Self-pay | Admitting: Family Medicine

## 2018-03-23 LAB — CBC WITH DIFFERENTIAL/PLATELET
Basophils Absolute: 38 cells/uL (ref 0–200)
Basophils Relative: 0.8 %
Eosinophils Absolute: 101 cells/uL (ref 15–500)
Eosinophils Relative: 2.1 %
HCT: 42.1 % (ref 38.5–50.0)
HEMOGLOBIN: 14.6 g/dL (ref 13.2–17.1)
Lymphs Abs: 782 cells/uL — ABNORMAL LOW (ref 850–3900)
MCH: 29.9 pg (ref 27.0–33.0)
MCHC: 34.7 g/dL (ref 32.0–36.0)
MCV: 86.1 fL (ref 80.0–100.0)
MONOS PCT: 9.4 %
MPV: 9.8 fL (ref 7.5–12.5)
NEUTROS PCT: 71.4 %
Neutro Abs: 3427 cells/uL (ref 1500–7800)
PLATELETS: 236 10*3/uL (ref 140–400)
RBC: 4.89 10*6/uL (ref 4.20–5.80)
RDW: 12.8 % (ref 11.0–15.0)
TOTAL LYMPHOCYTE: 16.3 %
WBC mixed population: 451 cells/uL (ref 200–950)
WBC: 4.8 10*3/uL (ref 3.8–10.8)

## 2018-03-23 LAB — COMPLETE METABOLIC PANEL WITH GFR
AG RATIO: 2 (calc) (ref 1.0–2.5)
ALBUMIN MSPROF: 4.3 g/dL (ref 3.6–5.1)
ALT: 11 U/L (ref 9–46)
AST: 25 U/L (ref 10–35)
Alkaline phosphatase (APISO): 107 U/L (ref 40–115)
BILIRUBIN TOTAL: 0.6 mg/dL (ref 0.2–1.2)
BUN: 20 mg/dL (ref 7–25)
CO2: 26 mmol/L (ref 20–32)
Calcium: 9.3 mg/dL (ref 8.6–10.3)
Chloride: 104 mmol/L (ref 98–110)
Creat: 0.81 mg/dL (ref 0.70–1.18)
GFR, Est African American: 104 mL/min/{1.73_m2} (ref >=60)
GFR, Est Non African American: 89 mL/min/{1.73_m2} (ref >=60)
GLUCOSE: 104 mg/dL — AB (ref 65–99)
Globulin: 2.2 g/dL (calc) (ref 1.9–3.7)
Potassium: 4.6 mmol/L (ref 3.5–5.3)
SODIUM: 140 mmol/L (ref 135–146)
Total Protein: 6.5 g/dL (ref 6.1–8.1)

## 2018-03-23 LAB — LIPID PANEL
CHOLESTEROL: 126 mg/dL (ref <200)
HDL: 37 mg/dL — ABNORMAL LOW (ref >40)
LDL CHOLESTEROL (CALC): 70 mg/dL
Non-HDL Cholesterol (Calc): 89 mg/dL (calc) (ref <130)
Total CHOL/HDL Ratio: 3.4 (calc) (ref <5.0)
Triglycerides: 103 mg/dL (ref <150)

## 2018-03-23 LAB — VITAMIN B12: VITAMIN B 12: 498 pg/mL (ref 200–1100)

## 2018-04-09 DIAGNOSIS — Z79899 Other long term (current) drug therapy: Secondary | ICD-10-CM | POA: Diagnosis not present

## 2018-04-09 DIAGNOSIS — G6289 Other specified polyneuropathies: Secondary | ICD-10-CM | POA: Diagnosis not present

## 2018-04-09 DIAGNOSIS — T887XXA Unspecified adverse effect of drug or medicament, initial encounter: Secondary | ICD-10-CM | POA: Diagnosis not present

## 2018-04-09 DIAGNOSIS — R1310 Dysphagia, unspecified: Secondary | ICD-10-CM | POA: Diagnosis not present

## 2018-04-09 DIAGNOSIS — R441 Visual hallucinations: Secondary | ICD-10-CM | POA: Diagnosis not present

## 2018-04-09 DIAGNOSIS — G20 Parkinson's disease: Secondary | ICD-10-CM | POA: Diagnosis not present

## 2018-04-09 DIAGNOSIS — G608 Other hereditary and idiopathic neuropathies: Secondary | ICD-10-CM | POA: Diagnosis not present

## 2018-04-09 DIAGNOSIS — R296 Repeated falls: Secondary | ICD-10-CM | POA: Diagnosis not present

## 2018-04-13 ENCOUNTER — Other Ambulatory Visit: Payer: Self-pay | Admitting: Family Medicine

## 2018-04-13 DIAGNOSIS — I4510 Unspecified right bundle-branch block: Secondary | ICD-10-CM

## 2018-05-01 ENCOUNTER — Encounter (HOSPITAL_COMMUNITY): Payer: Self-pay | Admitting: *Deleted

## 2018-05-01 ENCOUNTER — Ambulatory Visit (INDEPENDENT_AMBULATORY_CARE_PROVIDER_SITE_OTHER): Payer: Medicare Other

## 2018-05-01 ENCOUNTER — Ambulatory Visit (HOSPITAL_COMMUNITY)
Admission: EM | Admit: 2018-05-01 | Discharge: 2018-05-01 | Disposition: A | Discharge status: Home / Self Care | Payer: Medicare Other | Attending: Internal Medicine | Admitting: Internal Medicine

## 2018-05-01 DIAGNOSIS — M79672 Pain in left foot: Secondary | ICD-10-CM | POA: Diagnosis not present

## 2018-05-01 DIAGNOSIS — S82892A Other fracture of left lower leg, initial encounter for closed fracture: Secondary | ICD-10-CM | POA: Diagnosis not present

## 2018-05-01 DIAGNOSIS — M25572 Pain in left ankle and joints of left foot: Secondary | ICD-10-CM | POA: Diagnosis not present

## 2018-05-01 DIAGNOSIS — S82832A Other fracture of upper and lower end of left fibula, initial encounter for closed fracture: Secondary | ICD-10-CM | POA: Diagnosis not present

## 2018-05-01 NOTE — ED Triage Notes (Signed)
Reports getting tangled up in briar patch yesterday, injuring left ankle.  Left lateral ankle swollen & painful.  No change in pt's usual sensation.  States unable to bear weight on LLE.

## 2018-05-01 NOTE — ED Provider Notes (Signed)
[place]    CSN: [id number] Arrival date & time: 05/01/18  1350     History   Chief Complaint Chief Complaint  Patient presents with  . Ankle Injury    HPI Alex Frazier is a 72 y.o. male. presents with injuries to left foot and ankle that occurred yesterday while walking outdoors.  She has history of Parkinson's disease and states he had fallen twice patient denies any additional injuries or loss of consciousness.  Patient reports pain that he describes as pressure that is different from his typical neuropathy pain associated with his known Parkinson's disease.  Patient states he is unable to bear weight on the affected extremity.  Condition is acute in nature. Condition is made better by nothing. Condition is made worse by nothing. Patient states that he is unsure if the Aleve and Motrin taken prior to arrival at this facility has helped. Recommendation from ortho on call - boot or splint with no weight bearing activities. This was communicated to patient and wife who declines boot or splint at this time and is requesting to go to orthopedic urgent care tomorrow afternoon.    Past Medical History:  Diagnosis Date  . B12 deficiency    Borderline  . Bulging discs   . Gastritis   . Hyperlipidemia   . Neuropathy   . Parkinsonian syndrome ([place])   . Peripheral neuropathy     Patient Active Problem List   Diagnosis Date Noted  . DDD (degenerative disc disease), cervical 04/07/2016  . Neuropathy   . Bulging discs   . Gastritis   . Parkinsonian syndrome ([place])   . Hyperlipidemia     Past Surgical History:  Procedure Laterality Date  . APPENDECTOMY    . BACK SURGERY         Home Medications    Prior to Admission medications   Medication Sig Start Date End Date Taking? Authorizing Provider  Alpha-Lipoic Acid 600 MG CAPS Take by mouth.   Yes [provider]  b complex vitamins tablet Take by mouth.   Yes [provider]    carbidopa-levodopa (SINEMET IR) 25-100 MG tablet 2.5 tablets 3 (three) times daily.  09/07/15  Yes [provider]  Carbidopa-Levodopa ER (SINEMET CR) 25-100 MG tablet controlled release Take 1 tablet by mouth at bedtime. 07/07/15  Yes [provider]  DULoxetine (CYMBALTA) 60 MG capsule Take 1 capsule (60 mg total) by mouth daily. 08/10/17  Yes Susy Frizzle, MD  Fish Oil-Cholecalciferol (OMEGA-3 FISH OIL/VITAMIN D3) 1000-1000 MG-UNIT CAPS Take by mouth.   Yes [provider]  fluticasone (FLONASE) 50 MCG/ACT nasal spray Place 2 sprays into both nostrils daily. 01/09/14  Yes Susy Frizzle, MD  Melatonin 10 MG TABS Take by mouth.   Yes [provider]  Polyethylene Glycol 3350 (MIRALAX PO) Take by mouth.   Yes [provider]  pravastatin (PRAVACHOL) 20 MG tablet TAKE 1 TABLET BY MOUTH DAILY AT 6PM 11/02/17  Yes Susy Frizzle, MD  saw palmetto 500 MG capsule Take 500 mg by mouth daily.   Yes [provider]    Family History Family History  Problem Relation Age of Onset  . Parkinson's disease Mother   . Parkinson's disease Father   . Diabetes Sister   . Heart disease Brother     Social History Social History   Tobacco Use  . Smoking status: Never Smoker  . Smokeless tobacco: Never Used  Substance Use Topics  .  Alcohol use: No  . Drug use: No     Allergies   Indomethacin and Lyrica [pregabalin]   Review of Systems Review of Systems   Physical Exam Triage Vital Signs ED Triage Vitals  Enc Vitals Group     BP 05/01/18 1457 (!) 91/59     Pulse Rate 05/01/18 1457 76     Resp 05/01/18 1457 16     Temp 05/01/18 1457 98.4 F (36.9 C)     Temp Source 05/01/18 1457 Oral     SpO2 05/01/18 1457 98 %     Weight --      Height --      Head Circumference --      Peak Flow --      Pain Score 05/01/18 1512 7     Pain Loc --      Pain Edu? --      Excl. in [place]? --    No data found.  Updated Vital Signs BP (!)  93/49   Pulse 76   Temp 98.4 F (36.9 C) (Oral)   Resp 16   SpO2 98%   Visual Acuity Right Eye Distance:   Left Eye Distance:   Bilateral Distance:    Right Eye Near:   Left Eye Near:    Bilateral Near:     Physical Exam  Constitutional: He is oriented to person, place, and time. He appears well-developed and well-nourished.  HENT:  Head: Normocephalic.  Neck: Normal range of motion.  Pulmonary/Chest: Effort normal.  Musculoskeletal: Normal range of motion. He exhibits edema (lateral talus body).  Neurological: He is alert and oriented to person, place, and time.  Skin: Skin is dry.  Abrasions noted to left arm  Psychiatric: He has a normal mood and affect.  Nursing note and vitals reviewed.    UC Treatments / Results  Labs (all labs ordered are listed, but only abnormal results are displayed) Labs Reviewed - No data to display  EKG None  Radiology No results found.  Procedures Procedures (including critical care time)  Medications Ordered in UC Medications - No data to display  Initial Impression / Assessment and Plan / UC Course  I have reviewed the triage vital signs and the nursing notes.  Pertinent labs & imaging results that were available during my care of the patient were reviewed by me and considered in my medical decision making (see chart for details).      Final Clinical Impressions(s) / UC Diagnoses   Final diagnoses:  None   Discharge Instructions   None    ED Prescriptions    None     Controlled Substance Prescriptions [place] Controlled Substance Registry consulted? Not Applicable   Jacqualine Mau, NP 05/01/18 1735

## 2018-05-01 NOTE — Discharge Instructions (Addendum)
No weight bearing activities

## 2018-05-02 DIAGNOSIS — S82832A Other fracture of upper and lower end of left fibula, initial encounter for closed fracture: Secondary | ICD-10-CM | POA: Diagnosis not present

## 2018-05-03 ENCOUNTER — Other Ambulatory Visit: Payer: Self-pay | Admitting: Family Medicine

## 2018-05-03 DIAGNOSIS — E785 Hyperlipidemia, unspecified: Secondary | ICD-10-CM

## 2018-05-03 MED ORDER — PRAVASTATIN SODIUM 20 MG PO TABS: ORAL_TABLET | ORAL | 1 refills | Status: DC

## 2018-05-05 DIAGNOSIS — S82832D Other fracture of upper and lower end of left fibula, subsequent encounter for closed fracture with routine healing: Secondary | ICD-10-CM | POA: Diagnosis not present

## 2018-05-12 DIAGNOSIS — S82832D Other fracture of upper and lower end of left fibula, subsequent encounter for closed fracture with routine healing: Secondary | ICD-10-CM | POA: Diagnosis not present

## 2018-06-09 ENCOUNTER — Ambulatory Visit (INDEPENDENT_AMBULATORY_CARE_PROVIDER_SITE_OTHER): Payer: Medicare Other | Admitting: Family Medicine

## 2018-06-09 ENCOUNTER — Encounter: Payer: Self-pay | Admitting: Cardiovascular Disease

## 2018-06-09 ENCOUNTER — Ambulatory Visit (INDEPENDENT_AMBULATORY_CARE_PROVIDER_SITE_OTHER): Payer: Medicare Other | Admitting: Cardiovascular Disease

## 2018-06-09 VITALS — BP 90/54 | HR 74 | Ht 68.0 in | Wt 159.0 lb

## 2018-06-09 DIAGNOSIS — I4510 Unspecified right bundle-branch block: Secondary | ICD-10-CM | POA: Diagnosis not present

## 2018-06-09 DIAGNOSIS — Z23 Encounter for immunization: Secondary | ICD-10-CM | POA: Diagnosis not present

## 2018-06-09 NOTE — Progress Notes (Signed)
Cardiology Office Note:    Date:  06/09/2018   ID:  Alex Frazier, DOB 11/03/1945, MRN [id number]  PCP:  Susy Frizzle, MD  Cardiologist:  No primary care provider on file.  Electrophysiologist:  None   Referring MD: Susy Frizzle, MD   Problem list 1.  Right bundle branch block 2.  Parkinson's disease  Chief Complaint  Patient presents with  . Abnormal ECG    History of Present Illness:    Alex Frazier is a 72 y.o. male with a hx of right bundle branch block consents disease.  We are asked to see him today for further evaluation of his right bundle branch block by Dr. Dennard Schaumann.   His medical doctor wants to start him on Seroquel.  He thought it would be wise to evaluate him for any cardiac issues.  He   is being seen today for the evaluation starting Seroquel in the setting  of RBBB  at the request of Jenna Luo T, MD.  No CP or dyspnea.    No syncope . No N/V. Has had hallucinations - seems to have improved at this poin t  Does not exercise .   Wheelchair bound    Spends most of his time in a wheelchair.  He recently broke his left ankle which is also further confined him to a wheelchair.  Past Medical History:  Diagnosis Date  . B12 deficiency    Borderline  . Bulging discs   . Gastritis   . Hyperlipidemia   . Neuropathy   . Parkinsonian syndrome ([place])   . Peripheral neuropathy     Past Surgical History:  Procedure Laterality Date  . APPENDECTOMY    . BACK SURGERY      Current Medications: Current Meds  Medication Sig  . Alpha-Lipoic Acid 600 MG CAPS Take 1 capsule by mouth daily.   Marland Kitchen b complex vitamins tablet Take 1,000 tablets by mouth daily.   . carbidopa-levodopa (SINEMET IR) 25-100 MG tablet 3 tablets 3 (three) times daily.   . Carbidopa-Levodopa ER (SINEMET CR) 25-100 MG tablet controlled release Take 1 tablet by mouth at bedtime.  . DULoxetine (CYMBALTA) 60 MG capsule Take 1 capsule (60 mg total) by mouth daily.  . Fish  Oil-Cholecalciferol (OMEGA-3 FISH OIL/VITAMIN D3) 1000-1000 MG-UNIT CAPS Take by mouth.  . fluticasone (FLONASE) 50 MCG/ACT nasal spray Place 2 sprays into both nostrils daily.  . Polyethylene Glycol 3350 (MIRALAX PO) Take by mouth.  . pravastatin (PRAVACHOL) 20 MG tablet TAKE 1 TABLET BY MOUTH DAILY AT 6PM     Allergies:   Indomethacin and Lyrica [pregabalin]   Social History   Socioeconomic History  . Marital status: Married    Spouse name: Not on file  . Number of children: Not on file  . Years of education: Not on file  . Highest education level: Not on file  Occupational History  . Not on file  Social Needs  . Financial resource strain: Not on file  . Food insecurity:    Worry: Not on file    Inability: Not on file  . Transportation needs:    Medical: Not on file    Non-medical: Not on file  Tobacco Use  . Smoking status: Never Smoker  . Smokeless tobacco: Never Used  Substance and Sexual Activity  . Alcohol use: No  . Drug use: No  . Sexual activity: Not on file  Lifestyle  . Physical activity:    Days per week:  Not on file    Minutes per session: Not on file  . Stress: Not on file  Relationships  . Social connections:    Talks on phone: Not on file    Gets together: Not on file    Attends religious service: Not on file    Active member of club or organization: Not on file    Attends meetings of clubs or organizations: Not on file    Relationship status: Not on file  Other Topics Concern  . Not on file  Social History Narrative  . Not on file     Family History: The patient's family history includes Diabetes in his sister; Heart disease in his brother; Parkinson's disease in his father and mother.  ROS:   Please see the history of present illness.     All other systems reviewed and are negative.  EKGs/Labs/Other Studies Reviewed:    The following studies were reviewed today:   EKG: June 09, 2018: Normal sinus rhythm at 74.  Right bundle branch  block.  QTC is 439 ms. Recent Labs: 03/22/2018: ALT 11; BUN 20; Creat 0.81; Hemoglobin 14.6; Platelets 236; Potassium 4.6; Sodium 140  Recent Lipid Panel    Component Value Date/Time   CHOL 126 03/22/2018 0927   TRIG 103 03/22/2018 0927   HDL 37 (L) 03/22/2018 0927   CHOLHDL 3.4 03/22/2018 0927   VLDL 26 02/16/2017 0917   LDLCALC 70 03/22/2018 0927    Physical Exam:    VS:  BP (!) 90/54   Pulse 74   Ht 5\' 8"  (1.727 m)   Wt 159 lb (72.1 kg)   SpO2 99%   BMI 24.18 kg/m     Wt Readings from Last 3 Encounters:  06/09/18 159 lb (72.1 kg)  03/22/18 157 lb (71.2 kg)  01/08/18 155 lb (70.3 kg)     GEN: Chronically ill-appearing elderly gentleman, no acute distress.  He appears to be mildly agitated. HEENT: Normal NECK: No JVD; No carotid bruits LYMPHATICS: No lymphadenopathy CARDIAC: Regular rate S1-S2. RESPIRATORY:  Clear to auscultation without rales, wheezing or rhonchi  ABDOMEN: Soft, non-tender, non-distended MUSCULOSKELETAL: Left lower leg is in a cast. SKIN: Warm and dry NEUROLOGIC:  Alert and oriented x 3 PSYCHIATRIC:  Normal affect   ASSESSMENT:    1. Right bundle branch block (RBBB)    PLAN:    In order of problems listed above:  1. 1.  Right bundle branch block: Patient presents with a right bundle branch block.  His neurologist is contemplating putting him on Seroquel.  Seroquel can have well complications including significant arrhythmias in patients with long QT syndrome.  He does not have a long QT interval.  He does have a right bundle branch block.  He is not having any cardiac symptoms such as shortness of breath or chest pain.  I would like to get an echocardiogram for further assessment of his LV function.  At this point he does not have any angina so I do not think that a stress Myoview study is indicated.  I will see him on an as-needed basis.    Medication Adjustments/Labs and Tests Ordered: Current medicines are reviewed at length with the  patient today.  Concerns regarding medicines are outlined above.  Orders Placed This Encounter  Procedures  . EKG 12-Lead  . ECHOCARDIOGRAM COMPLETE   No orders of the defined types were placed in this encounter.   Patient Instructions  Medication Instructions:  Your physician recommends that you  continue on your current medications as directed. Please refer to the Current Medication list given to you today.   Labwork: None Ordered   Testing/Procedures: Your physician has requested that you have an echocardiogram. Echocardiography is a painless test that uses sound waves to create images of your heart. It provides your doctor with information about the size and shape of your heart and how well your heart's chambers and valves are working. This procedure takes approximately one hour. There are no restrictions for this procedure.   Follow-Up: Your physician recommends that you schedule a follow-up appointment in: as needed with Dr. Acie Fredrickson   If you need a refill on your cardiac medications before your next appointment, please call your pharmacy.   Thank you for choosing [facility]! Christen Bame, RN [phone]       Signed, Mertie Moores, MD  06/09/2018 3:06 PM    [facility]

## 2018-06-09 NOTE — Patient Instructions (Signed)
Medication Instructions:  Your physician recommends that you continue on your current medications as directed. Please refer to the Current Medication list given to you today.   Labwork: None Ordered   Testing/Procedures: Your physician has requested that you have an echocardiogram. Echocardiography is a painless test that uses sound waves to create images of your heart. It provides your doctor with information about the size and shape of your heart and how well your heart's chambers and valves are working. This procedure takes approximately one hour. There are no restrictions for this procedure.    Follow-Up: Your physician recommends that you schedule a follow-up appointment in: as needed with Dr. Nahser.    If you need a refill on your cardiac medications before your next appointment, please call your pharmacy.   Thank you for choosing [facility]! Terryn Redner, RN [phone]    

## 2018-06-11 DIAGNOSIS — S82832D Other fracture of upper and lower end of left fibula, subsequent encounter for closed fracture with routine healing: Secondary | ICD-10-CM | POA: Diagnosis not present

## 2018-06-14 ENCOUNTER — Other Ambulatory Visit: Payer: Self-pay

## 2018-06-14 ENCOUNTER — Ambulatory Visit (HOSPITAL_COMMUNITY): Payer: Medicare Other | Attending: Cardiovascular Disease

## 2018-06-14 DIAGNOSIS — I081 Rheumatic disorders of both mitral and tricuspid valves: Secondary | ICD-10-CM | POA: Diagnosis not present

## 2018-06-14 DIAGNOSIS — E785 Hyperlipidemia, unspecified: Secondary | ICD-10-CM | POA: Diagnosis not present

## 2018-06-14 DIAGNOSIS — I4510 Unspecified right bundle-branch block: Secondary | ICD-10-CM | POA: Diagnosis not present

## 2018-06-15 DIAGNOSIS — M85872 Other specified disorders of bone density and structure, left ankle and foot: Secondary | ICD-10-CM | POA: Diagnosis not present

## 2018-06-15 DIAGNOSIS — Z79899 Other long term (current) drug therapy: Secondary | ICD-10-CM | POA: Diagnosis not present

## 2018-06-15 DIAGNOSIS — G20 Parkinson's disease: Secondary | ICD-10-CM | POA: Diagnosis not present

## 2018-06-15 DIAGNOSIS — S82832D Other fracture of upper and lower end of left fibula, subsequent encounter for closed fracture with routine healing: Secondary | ICD-10-CM | POA: Diagnosis not present

## 2018-06-15 DIAGNOSIS — Z9181 History of falling: Secondary | ICD-10-CM | POA: Diagnosis not present

## 2018-06-17 DIAGNOSIS — Z9181 History of falling: Secondary | ICD-10-CM | POA: Diagnosis not present

## 2018-06-17 DIAGNOSIS — M85872 Other specified disorders of bone density and structure, left ankle and foot: Secondary | ICD-10-CM | POA: Diagnosis not present

## 2018-06-17 DIAGNOSIS — S82832D Other fracture of upper and lower end of left fibula, subsequent encounter for closed fracture with routine healing: Secondary | ICD-10-CM | POA: Diagnosis not present

## 2018-06-17 DIAGNOSIS — G20 Parkinson's disease: Secondary | ICD-10-CM | POA: Diagnosis not present

## 2018-06-17 DIAGNOSIS — Z79899 Other long term (current) drug therapy: Secondary | ICD-10-CM | POA: Diagnosis not present

## 2018-06-18 DIAGNOSIS — Z9181 History of falling: Secondary | ICD-10-CM | POA: Diagnosis not present

## 2018-06-18 DIAGNOSIS — S82832D Other fracture of upper and lower end of left fibula, subsequent encounter for closed fracture with routine healing: Secondary | ICD-10-CM | POA: Diagnosis not present

## 2018-06-18 DIAGNOSIS — Z79899 Other long term (current) drug therapy: Secondary | ICD-10-CM | POA: Diagnosis not present

## 2018-06-18 DIAGNOSIS — M85872 Other specified disorders of bone density and structure, left ankle and foot: Secondary | ICD-10-CM | POA: Diagnosis not present

## 2018-06-18 DIAGNOSIS — G20 Parkinson's disease: Secondary | ICD-10-CM | POA: Diagnosis not present

## 2018-06-21 DIAGNOSIS — G20 Parkinson's disease: Secondary | ICD-10-CM | POA: Diagnosis not present

## 2018-06-21 DIAGNOSIS — M85872 Other specified disorders of bone density and structure, left ankle and foot: Secondary | ICD-10-CM | POA: Diagnosis not present

## 2018-06-21 DIAGNOSIS — Z9181 History of falling: Secondary | ICD-10-CM | POA: Diagnosis not present

## 2018-06-21 DIAGNOSIS — S82832D Other fracture of upper and lower end of left fibula, subsequent encounter for closed fracture with routine healing: Secondary | ICD-10-CM | POA: Diagnosis not present

## 2018-06-21 DIAGNOSIS — Z79899 Other long term (current) drug therapy: Secondary | ICD-10-CM | POA: Diagnosis not present

## 2018-06-23 DIAGNOSIS — Z79899 Other long term (current) drug therapy: Secondary | ICD-10-CM | POA: Diagnosis not present

## 2018-06-23 DIAGNOSIS — Z9181 History of falling: Secondary | ICD-10-CM | POA: Diagnosis not present

## 2018-06-23 DIAGNOSIS — M85872 Other specified disorders of bone density and structure, left ankle and foot: Secondary | ICD-10-CM | POA: Diagnosis not present

## 2018-06-23 DIAGNOSIS — S82832D Other fracture of upper and lower end of left fibula, subsequent encounter for closed fracture with routine healing: Secondary | ICD-10-CM | POA: Diagnosis not present

## 2018-06-23 DIAGNOSIS — G20 Parkinson's disease: Secondary | ICD-10-CM | POA: Diagnosis not present

## 2018-06-29 DIAGNOSIS — Z79899 Other long term (current) drug therapy: Secondary | ICD-10-CM | POA: Diagnosis not present

## 2018-06-29 DIAGNOSIS — S82832D Other fracture of upper and lower end of left fibula, subsequent encounter for closed fracture with routine healing: Secondary | ICD-10-CM | POA: Diagnosis not present

## 2018-06-29 DIAGNOSIS — G20 Parkinson's disease: Secondary | ICD-10-CM | POA: Diagnosis not present

## 2018-06-29 DIAGNOSIS — M85872 Other specified disorders of bone density and structure, left ankle and foot: Secondary | ICD-10-CM | POA: Diagnosis not present

## 2018-06-29 DIAGNOSIS — Z9181 History of falling: Secondary | ICD-10-CM | POA: Diagnosis not present

## 2018-07-01 DIAGNOSIS — M85872 Other specified disorders of bone density and structure, left ankle and foot: Secondary | ICD-10-CM | POA: Diagnosis not present

## 2018-07-01 DIAGNOSIS — Z9181 History of falling: Secondary | ICD-10-CM | POA: Diagnosis not present

## 2018-07-01 DIAGNOSIS — G20 Parkinson's disease: Secondary | ICD-10-CM | POA: Diagnosis not present

## 2018-07-01 DIAGNOSIS — Z79899 Other long term (current) drug therapy: Secondary | ICD-10-CM | POA: Diagnosis not present

## 2018-07-01 DIAGNOSIS — S82832D Other fracture of upper and lower end of left fibula, subsequent encounter for closed fracture with routine healing: Secondary | ICD-10-CM | POA: Diagnosis not present

## 2018-07-07 DIAGNOSIS — Z9181 History of falling: Secondary | ICD-10-CM | POA: Diagnosis not present

## 2018-07-07 DIAGNOSIS — S82832D Other fracture of upper and lower end of left fibula, subsequent encounter for closed fracture with routine healing: Secondary | ICD-10-CM | POA: Diagnosis not present

## 2018-07-07 DIAGNOSIS — M85872 Other specified disorders of bone density and structure, left ankle and foot: Secondary | ICD-10-CM | POA: Diagnosis not present

## 2018-07-07 DIAGNOSIS — G20 Parkinson's disease: Secondary | ICD-10-CM | POA: Diagnosis not present

## 2018-07-07 DIAGNOSIS — Z79899 Other long term (current) drug therapy: Secondary | ICD-10-CM | POA: Diagnosis not present

## 2018-07-09 DIAGNOSIS — Z9181 History of falling: Secondary | ICD-10-CM | POA: Diagnosis not present

## 2018-07-09 DIAGNOSIS — M85872 Other specified disorders of bone density and structure, left ankle and foot: Secondary | ICD-10-CM | POA: Diagnosis not present

## 2018-07-09 DIAGNOSIS — Z79899 Other long term (current) drug therapy: Secondary | ICD-10-CM | POA: Diagnosis not present

## 2018-07-09 DIAGNOSIS — S82832D Other fracture of upper and lower end of left fibula, subsequent encounter for closed fracture with routine healing: Secondary | ICD-10-CM | POA: Diagnosis not present

## 2018-07-09 DIAGNOSIS — G20 Parkinson's disease: Secondary | ICD-10-CM | POA: Diagnosis not present

## 2018-07-14 DIAGNOSIS — S82832D Other fracture of upper and lower end of left fibula, subsequent encounter for closed fracture with routine healing: Secondary | ICD-10-CM | POA: Diagnosis not present

## 2018-07-15 DIAGNOSIS — G20 Parkinson's disease: Secondary | ICD-10-CM | POA: Diagnosis not present

## 2018-07-15 DIAGNOSIS — Z9181 History of falling: Secondary | ICD-10-CM | POA: Diagnosis not present

## 2018-07-15 DIAGNOSIS — S82832D Other fracture of upper and lower end of left fibula, subsequent encounter for closed fracture with routine healing: Secondary | ICD-10-CM | POA: Diagnosis not present

## 2018-07-15 DIAGNOSIS — Z79899 Other long term (current) drug therapy: Secondary | ICD-10-CM | POA: Diagnosis not present

## 2018-07-15 DIAGNOSIS — M85872 Other specified disorders of bone density and structure, left ankle and foot: Secondary | ICD-10-CM | POA: Diagnosis not present

## 2018-07-19 DIAGNOSIS — R296 Repeated falls: Secondary | ICD-10-CM | POA: Diagnosis not present

## 2018-07-19 DIAGNOSIS — G629 Polyneuropathy, unspecified: Secondary | ICD-10-CM | POA: Diagnosis not present

## 2018-07-19 DIAGNOSIS — Z79899 Other long term (current) drug therapy: Secondary | ICD-10-CM | POA: Diagnosis not present

## 2018-07-19 DIAGNOSIS — R1310 Dysphagia, unspecified: Secondary | ICD-10-CM | POA: Diagnosis not present

## 2018-07-19 DIAGNOSIS — G20 Parkinson's disease: Secondary | ICD-10-CM | POA: Diagnosis not present

## 2018-07-20 DIAGNOSIS — Z79899 Other long term (current) drug therapy: Secondary | ICD-10-CM | POA: Diagnosis not present

## 2018-07-20 DIAGNOSIS — G20 Parkinson's disease: Secondary | ICD-10-CM | POA: Diagnosis not present

## 2018-07-20 DIAGNOSIS — S82832D Other fracture of upper and lower end of left fibula, subsequent encounter for closed fracture with routine healing: Secondary | ICD-10-CM | POA: Diagnosis not present

## 2018-07-20 DIAGNOSIS — Z9181 History of falling: Secondary | ICD-10-CM | POA: Diagnosis not present

## 2018-07-20 DIAGNOSIS — M85872 Other specified disorders of bone density and structure, left ankle and foot: Secondary | ICD-10-CM | POA: Diagnosis not present

## 2018-07-21 DIAGNOSIS — M85872 Other specified disorders of bone density and structure, left ankle and foot: Secondary | ICD-10-CM | POA: Diagnosis not present

## 2018-07-21 DIAGNOSIS — Z79899 Other long term (current) drug therapy: Secondary | ICD-10-CM | POA: Diagnosis not present

## 2018-07-21 DIAGNOSIS — G20 Parkinson's disease: Secondary | ICD-10-CM | POA: Diagnosis not present

## 2018-07-21 DIAGNOSIS — Z9181 History of falling: Secondary | ICD-10-CM | POA: Diagnosis not present

## 2018-07-21 DIAGNOSIS — S82832D Other fracture of upper and lower end of left fibula, subsequent encounter for closed fracture with routine healing: Secondary | ICD-10-CM | POA: Diagnosis not present

## 2018-07-26 ENCOUNTER — Other Ambulatory Visit: Payer: Self-pay

## 2018-07-27 DIAGNOSIS — M85872 Other specified disorders of bone density and structure, left ankle and foot: Secondary | ICD-10-CM | POA: Diagnosis not present

## 2018-07-27 DIAGNOSIS — Z79899 Other long term (current) drug therapy: Secondary | ICD-10-CM | POA: Diagnosis not present

## 2018-07-27 DIAGNOSIS — G20 Parkinson's disease: Secondary | ICD-10-CM | POA: Diagnosis not present

## 2018-07-27 DIAGNOSIS — S82832D Other fracture of upper and lower end of left fibula, subsequent encounter for closed fracture with routine healing: Secondary | ICD-10-CM | POA: Diagnosis not present

## 2018-07-27 DIAGNOSIS — Z9181 History of falling: Secondary | ICD-10-CM | POA: Diagnosis not present

## 2018-07-30 DIAGNOSIS — S82832D Other fracture of upper and lower end of left fibula, subsequent encounter for closed fracture with routine healing: Secondary | ICD-10-CM | POA: Diagnosis not present

## 2018-07-30 DIAGNOSIS — M85872 Other specified disorders of bone density and structure, left ankle and foot: Secondary | ICD-10-CM | POA: Diagnosis not present

## 2018-07-30 DIAGNOSIS — G20 Parkinson's disease: Secondary | ICD-10-CM | POA: Diagnosis not present

## 2018-07-30 DIAGNOSIS — Z9181 History of falling: Secondary | ICD-10-CM | POA: Diagnosis not present

## 2018-07-30 DIAGNOSIS — Z79899 Other long term (current) drug therapy: Secondary | ICD-10-CM | POA: Diagnosis not present

## 2018-08-02 DIAGNOSIS — M85872 Other specified disorders of bone density and structure, left ankle and foot: Secondary | ICD-10-CM | POA: Diagnosis not present

## 2018-08-02 DIAGNOSIS — Z79899 Other long term (current) drug therapy: Secondary | ICD-10-CM | POA: Diagnosis not present

## 2018-08-02 DIAGNOSIS — S82832D Other fracture of upper and lower end of left fibula, subsequent encounter for closed fracture with routine healing: Secondary | ICD-10-CM | POA: Diagnosis not present

## 2018-08-02 DIAGNOSIS — Z9181 History of falling: Secondary | ICD-10-CM | POA: Diagnosis not present

## 2018-08-02 DIAGNOSIS — G20 Parkinson's disease: Secondary | ICD-10-CM | POA: Diagnosis not present

## 2018-08-04 DIAGNOSIS — S82832D Other fracture of upper and lower end of left fibula, subsequent encounter for closed fracture with routine healing: Secondary | ICD-10-CM | POA: Diagnosis not present

## 2018-08-04 DIAGNOSIS — Z79899 Other long term (current) drug therapy: Secondary | ICD-10-CM | POA: Diagnosis not present

## 2018-08-04 DIAGNOSIS — G20 Parkinson's disease: Secondary | ICD-10-CM | POA: Diagnosis not present

## 2018-08-04 DIAGNOSIS — Z9181 History of falling: Secondary | ICD-10-CM | POA: Diagnosis not present

## 2018-08-04 DIAGNOSIS — M85872 Other specified disorders of bone density and structure, left ankle and foot: Secondary | ICD-10-CM | POA: Diagnosis not present

## 2018-08-12 ENCOUNTER — Encounter: Payer: Self-pay | Admitting: Family Medicine

## 2018-08-12 MED ORDER — DULOXETINE HCL 60 MG PO CPEP: 60.0000 mg | ORAL_CAPSULE | Freq: Every day | ORAL | 3 refills | Status: DC

## 2018-08-16 DIAGNOSIS — H52201 Unspecified astigmatism, right eye: Secondary | ICD-10-CM | POA: Diagnosis not present

## 2018-08-16 DIAGNOSIS — H25013 Cortical age-related cataract, bilateral: Secondary | ICD-10-CM | POA: Diagnosis not present

## 2018-08-16 DIAGNOSIS — H43813 Vitreous degeneration, bilateral: Secondary | ICD-10-CM | POA: Diagnosis not present

## 2018-08-16 DIAGNOSIS — H2513 Age-related nuclear cataract, bilateral: Secondary | ICD-10-CM | POA: Diagnosis not present

## 2018-08-16 LAB — HM DIABETES EYE EXAM

## 2018-08-17 ENCOUNTER — Other Ambulatory Visit: Payer: Self-pay | Admitting: *Deleted

## 2018-08-17 MED ORDER — DULOXETINE HCL 60 MG PO CPEP: 60.0000 mg | ORAL_CAPSULE | Freq: Every day | ORAL | 3 refills | Status: DC

## 2018-09-09 ENCOUNTER — Encounter: Payer: Self-pay | Admitting: *Deleted

## 2018-09-23 ENCOUNTER — Encounter: Payer: Self-pay | Admitting: Family Medicine

## 2018-09-23 ENCOUNTER — Ambulatory Visit (INDEPENDENT_AMBULATORY_CARE_PROVIDER_SITE_OTHER): Payer: Medicare Other | Admitting: Family Medicine

## 2018-09-23 VITALS — BP 100/62 | HR 72 | Temp 98.0°F | Resp 16 | Ht 68.0 in

## 2018-09-23 DIAGNOSIS — G629 Polyneuropathy, unspecified: Secondary | ICD-10-CM | POA: Diagnosis not present

## 2018-09-23 DIAGNOSIS — G20 Parkinson's disease: Secondary | ICD-10-CM | POA: Diagnosis not present

## 2018-09-23 DIAGNOSIS — E7800 Pure hypercholesterolemia, unspecified: Secondary | ICD-10-CM | POA: Diagnosis not present

## 2018-09-23 DIAGNOSIS — Z125 Encounter for screening for malignant neoplasm of prostate: Secondary | ICD-10-CM | POA: Diagnosis not present

## 2018-09-23 DIAGNOSIS — G20A1 Parkinson's disease without dyskinesia, without mention of fluctuations: Secondary | ICD-10-CM

## 2018-09-23 NOTE — Progress Notes (Signed)
Subjective:    Patient ID: Alex Frazier, male    DOB: 07-22-1946, 73 y.o.   MRN: [id number]  Patient is here today for regular follow-up regarding his hyperlipidemia.  He is also due for prostate cancer screening.  Patient suffers from Parkinson's disease.  This leaves him high fall risk.  He has to walk using a walker.  He is not physically able to do many of the things that he enjoys.  This leaves him feeling worthless.  I specifically question the patient today if he feels depressed.  He denies depression or anhedonia or insomnia or suicidal thoughts but he is frustrated by not being able to do the things he wants could do physically.  His blood pressure today is well controlled at 100/62.  He denies any chest pain shortness of breath or dyspnea on exertion.  He denies any myalgias or right upper quadrant pain.  He denies any nausea or vomiting or diarrhea.  He denies any constipation.  He denies any melena or hematochezia.  He denies any dysuria although he does occasionally have urinary frequency that leads to urge incontinence. Past Medical History:  Diagnosis Date  . B12 deficiency    Borderline  . Bulging discs   . Gastritis   . Hyperlipidemia   . Neuropathy   . Parkinsonian syndrome ([place])   . Peripheral neuropathy    Current Outpatient Medications on File Prior to Visit  Medication Sig Dispense Refill  . b complex vitamins tablet Take 1,000 tablets by mouth daily.     . carbidopa-levodopa (SINEMET IR) 25-100 MG tablet 3 tablets 3 (three) times daily.     . Carbidopa-Levodopa ER (SINEMET CR) 25-100 MG tablet controlled release Take 1 tablet by mouth at bedtime.  2  . DULoxetine (CYMBALTA) 60 MG capsule Take 1 capsule (60 mg total) by mouth daily. 90 capsule 3  . Fish Oil-Cholecalciferol (OMEGA-3 FISH OIL/VITAMIN D3) 1000-1000 MG-UNIT CAPS Take by mouth.    . fluticasone (FLONASE) 50 MCG/ACT nasal spray Place 2 sprays into both nostrils daily. 16 g 6  . Polyethylene Glycol 3350  (MIRALAX PO) Take by mouth.    . pravastatin (PRAVACHOL) 20 MG tablet TAKE 1 TABLET BY MOUTH DAILY AT 6PM 90 tablet 1   No current facility-administered medications on file prior to visit.    Allergies  Allergen Reactions  . Indomethacin   . Lyrica [Pregabalin]    Social History   Socioeconomic History  . Marital status: Married    Spouse name: Not on file  . Number of children: Not on file  . Years of education: Not on file  . Highest education level: Not on file  Occupational History  . Not on file  Social Needs  . Financial resource strain: Not on file  . Food insecurity:    Worry: Not on file    Inability: Not on file  . Transportation needs:    Medical: Not on file    Non-medical: Not on file  Tobacco Use  . Smoking status: Never Smoker  . Smokeless tobacco: Never Used  Substance and Sexual Activity  . Alcohol use: No  . Drug use: No  . Sexual activity: Not on file  Lifestyle  . Physical activity:    Days per week: Not on file    Minutes per session: Not on file  . Stress: Not on file  Relationships  . Social connections:    Talks on phone: Not on file    Gets together:  Not on file    Attends religious service: Not on file    Active member of club or organization: Not on file    Attends meetings of clubs or organizations: Not on file    Relationship status: Not on file  . Intimate partner violence:    Fear of current or ex partner: Not on file    Emotionally abused: Not on file    Physically abused: Not on file    Forced sexual activity: Not on file  Other Topics Concern  . Not on file  Social History Narrative  . Not on file     Review of Systems  All other systems reviewed and are negative.      Objective:   Physical Exam  Constitutional: He appears well-developed and well-nourished.  HENT:  Nose: Nose normal.  Mouth/Throat: Oropharynx is clear and moist.  Eyes: Conjunctivae are normal. No scleral icterus.  Neck: Neck supple. No JVD  present. No thyromegaly present.  Cardiovascular: Normal rate and regular rhythm. Exam reveals no friction rub.  No murmur heard. Pulmonary/Chest: Effort normal and breath sounds normal. No respiratory distress. He has no wheezes. He has no rales.  Abdominal: Soft. Bowel sounds are normal. He exhibits no distension. There is no abdominal tenderness. There is no rebound and no guarding.  Musculoskeletal:        General: No edema.  Lymphadenopathy:    He has no cervical adenopathy.  Skin: Skin is warm. No rash noted. No erythema. No pallor.  Vitals reviewed.   Patient has bradykinesia. He has no resting tremor. Has a slow shuffling gait and walks with a four-pronged cane.    Assessment & Plan:  Parkinson disease ([place])  Pure hypercholesterolemia - Plan: CBC with Differential/Platelet, COMPLETE METABOLIC PANEL WITH GFR, Lipid panel  Neuropathy  Prostate cancer screening - Plan: PSA   Medically the patient is stable compared to 6 months ago.  Denies any symptoms of angina or congestive heart failure.  He denies any myalgias or right upper quadrant pain.  His blood pressure remains well controlled.  I will check a patient CBC, CMP, and fasting lipid panel today.  I will screen the patient for prostate cancer with a PSA.  Specifically question the patient regarding depression which he denies.  I encouraged the patient that man's worth is not based solely on his physical ability but also what he can contribute is an individual.  The patient has 72 years of life with him that he can impart to his family and grandchildren.  I try to reassure the patient that he is still providing useful example to his family and grandchildren.

## 2018-09-24 LAB — CBC WITH DIFFERENTIAL/PLATELET
Absolute Monocytes: 422 cells/uL (ref 200–950)
Basophils Absolute: 29 cells/uL (ref 0–200)
Basophils Relative: 0.7 %
EOS ABS: 62 {cells}/uL (ref 15–500)
EOS PCT: 1.5 %
HCT: 43.4 % (ref 38.5–50.0)
Hemoglobin: 14.5 g/dL (ref 13.2–17.1)
Lymphs Abs: 783 cells/uL — ABNORMAL LOW (ref 850–3900)
MCH: 29.6 pg (ref 27.0–33.0)
MCHC: 33.4 g/dL (ref 32.0–36.0)
MCV: 88.6 fL (ref 80.0–100.0)
MONOS PCT: 10.3 %
MPV: 9.6 fL (ref 7.5–12.5)
NEUTROS ABS: 2804 {cells}/uL (ref 1500–7800)
NEUTROS PCT: 68.4 %
Platelets: 257 10*3/uL (ref 140–400)
RBC: 4.9 10*6/uL (ref 4.20–5.80)
RDW: 12.5 % (ref 11.0–15.0)
TOTAL LYMPHOCYTE: 19.1 %
WBC: 4.1 10*3/uL (ref 3.8–10.8)

## 2018-09-24 LAB — COMPLETE METABOLIC PANEL WITH GFR
AG Ratio: 1.8 (calc) (ref 1.0–2.5)
ALKALINE PHOSPHATASE (APISO): 99 U/L (ref 40–115)
ALT: 10 U/L (ref 9–46)
AST: 24 U/L (ref 10–35)
Albumin: 4.2 g/dL (ref 3.6–5.1)
BILIRUBIN TOTAL: 0.5 mg/dL (ref 0.2–1.2)
BUN: 17 mg/dL (ref 7–25)
CHLORIDE: 104 mmol/L (ref 98–110)
CO2: 27 mmol/L (ref 20–32)
Calcium: 9.5 mg/dL (ref 8.6–10.3)
Creat: 0.78 mg/dL (ref 0.70–1.18)
GFR, EST NON AFRICAN AMERICAN: 90 mL/min/{1.73_m2} (ref >=60)
GFR, Est African American: 105 mL/min/{1.73_m2} (ref >=60)
GLUCOSE: 95 mg/dL (ref 65–99)
Globulin: 2.4 g/dL (calc) (ref 1.9–3.7)
POTASSIUM: 4.9 mmol/L (ref 3.5–5.3)
Sodium: 140 mmol/L (ref 135–146)
Total Protein: 6.6 g/dL (ref 6.1–8.1)

## 2018-09-24 LAB — LIPID PANEL
Cholesterol: 132 mg/dL (ref <200)
HDL: 42 mg/dL (ref >40)
LDL CHOLESTEROL (CALC): 75 mg/dL
NON-HDL CHOLESTEROL (CALC): 90 mg/dL (ref <130)
TRIGLYCERIDES: 66 mg/dL (ref <150)
Total CHOL/HDL Ratio: 3.1 (calc) (ref <5.0)

## 2018-09-24 LAB — PSA: PSA: 1.1 ng/mL (ref <=4.0)

## 2018-09-28 ENCOUNTER — Encounter: Payer: Self-pay | Admitting: Family Medicine

## 2018-09-30 DIAGNOSIS — H25012 Cortical age-related cataract, left eye: Secondary | ICD-10-CM | POA: Diagnosis not present

## 2018-09-30 DIAGNOSIS — H2512 Age-related nuclear cataract, left eye: Secondary | ICD-10-CM | POA: Diagnosis not present

## 2018-09-30 DIAGNOSIS — H25812 Combined forms of age-related cataract, left eye: Secondary | ICD-10-CM | POA: Diagnosis not present

## 2018-10-26 ENCOUNTER — Other Ambulatory Visit: Payer: Self-pay | Admitting: Family Medicine

## 2018-10-26 DIAGNOSIS — E785 Hyperlipidemia, unspecified: Secondary | ICD-10-CM

## 2018-11-18 DIAGNOSIS — H25011 Cortical age-related cataract, right eye: Secondary | ICD-10-CM | POA: Diagnosis not present

## 2018-11-18 DIAGNOSIS — H25811 Combined forms of age-related cataract, right eye: Secondary | ICD-10-CM | POA: Diagnosis not present

## 2018-11-18 DIAGNOSIS — H2511 Age-related nuclear cataract, right eye: Secondary | ICD-10-CM | POA: Diagnosis not present

## 2019-03-25 ENCOUNTER — Ambulatory Visit: Payer: Medicare Other | Admitting: Family Medicine

## 2019-03-29 ENCOUNTER — Ambulatory Visit: Payer: Medicare Other | Admitting: Family Medicine

## 2019-04-08 DIAGNOSIS — G20 Parkinson's disease: Secondary | ICD-10-CM | POA: Diagnosis not present

## 2019-04-08 DIAGNOSIS — R1310 Dysphagia, unspecified: Secondary | ICD-10-CM | POA: Diagnosis not present

## 2019-04-22 ENCOUNTER — Other Ambulatory Visit: Payer: Self-pay

## 2019-04-25 ENCOUNTER — Encounter: Payer: Self-pay | Admitting: Family Medicine

## 2019-04-25 ENCOUNTER — Ambulatory Visit (INDEPENDENT_AMBULATORY_CARE_PROVIDER_SITE_OTHER): Payer: Medicare Other | Admitting: Family Medicine

## 2019-04-25 ENCOUNTER — Other Ambulatory Visit: Payer: Self-pay

## 2019-04-25 VITALS — BP 134/76 | HR 68 | Temp 97.6°F | Resp 14 | Ht 68.0 in | Wt 149.0 lb

## 2019-04-25 DIAGNOSIS — N3281 Overactive bladder: Secondary | ICD-10-CM

## 2019-04-25 DIAGNOSIS — G20 Parkinson's disease: Secondary | ICD-10-CM

## 2019-04-25 DIAGNOSIS — Z13220 Encounter for screening for lipoid disorders: Secondary | ICD-10-CM

## 2019-04-25 NOTE — Progress Notes (Signed)
Subjective:    Patient ID: Alex Frazier, male    DOB: 08/20/1946, 73 y.o.   MRN: [id number]  Patient is here today for regular follow-up regarding his hyperlipidemia.  Patient suffers from Parkinson's disease.  This leaves him high fall risk.  Wife is interested in getting physical therapy at home.  He is starting to drag his left foot when he walks.  He has a brace on his right leg to prevent him from dropping his foot as he walks.  However she is questions if he may benefit from a brace on his right leg.  He also has progressive muscle weakness in his hamstrings and quads.  He continues to report neuropathic pain in both feet primarily in the heels.  The pain shoots from his heels to his toes.  It feels like an electrical pain.  He also has been having increasing hallucinations of bugs crawling on his skin.  Recently saw neurology and they decreased the dose of his Sinemet with the plan of possibly adding Seroquel if the symptoms do not improve over the next few months.  He also reports urinary incontinence at night.  He is wearing depends.  However he sleeps through the night and is not able to feel the symptoms or urge and therefore has urinary incontinence. Past Medical History:  Diagnosis Date  . B12 deficiency    Borderline  . Bulging discs   . Gastritis   . Hyperlipidemia   . Neuropathy   . Parkinsonian syndrome ([place])   . Peripheral neuropathy    Current Outpatient Medications on File Prior to Visit  Medication Sig Dispense Refill  . b complex vitamins tablet Take 1,000 tablets by mouth daily.     . carbidopa-levodopa (SINEMET IR) 25-100 MG tablet 3 tablets 3 (three) times daily.     . Carbidopa-Levodopa ER (SINEMET CR) 25-100 MG tablet controlled release Take 1 tablet by mouth at bedtime.  2  . DULoxetine (CYMBALTA) 60 MG capsule Take 1 capsule (60 mg total) by mouth daily. 90 capsule 3  . Fish Oil-Cholecalciferol (OMEGA-3 FISH OIL/VITAMIN D3) 1000-1000 MG-UNIT CAPS Take by mouth.     . fluticasone (FLONASE) 50 MCG/ACT nasal spray Place 2 sprays into both nostrils daily. 16 g 6  . Polyethylene Glycol 3350 (MIRALAX PO) Take by mouth.    . pravastatin (PRAVACHOL) 20 MG tablet TAKE 1 TABLET BY MOUTH DAILY AT 6 PM GENERIC EQUIVALENT FOR PRAVACHOL 90 tablet 1   No current facility-administered medications on file prior to visit.    Allergies  Allergen Reactions  . Indomethacin   . Lyrica [Pregabalin]    Social History   Socioeconomic History  . Marital status: Married    Spouse name: Not on file  . Number of children: Not on file  . Years of education: Not on file  . Highest education level: Not on file  Occupational History  . Not on file  Social Needs  . Financial resource strain: Not on file  . Food insecurity    Worry: Not on file    Inability: Not on file  . Transportation needs    Medical: Not on file    Non-medical: Not on file  Tobacco Use  . Smoking status: Never Smoker  . Smokeless tobacco: Never Used  Substance and Sexual Activity  . Alcohol use: No  . Drug use: No  . Sexual activity: Not on file  Lifestyle  . Physical activity    Days per week: Not on  file    Minutes per session: Not on file  . Stress: Not on file  Relationships  . Social Herbalist on phone: Not on file    Gets together: Not on file    Attends religious service: Not on file    Active member of club or organization: Not on file    Attends meetings of clubs or organizations: Not on file    Relationship status: Not on file  . Intimate partner violence    Fear of current or ex partner: Not on file    Emotionally abused: Not on file    Physically abused: Not on file    Forced sexual activity: Not on file  Other Topics Concern  . Not on file  Social History Narrative  . Not on file     Review of Systems  All other systems reviewed and are negative.      Objective:   Physical Exam  Constitutional: He appears well-developed and well-nourished.  HENT:   Nose: Nose normal.  Mouth/Throat: Oropharynx is clear and moist.  Eyes: Conjunctivae are normal. No scleral icterus.  Neck: Neck supple. No JVD present. No thyromegaly present.  Cardiovascular: Normal rate and regular rhythm. Exam reveals no friction rub.  No murmur heard. Pulmonary/Chest: Effort normal and breath sounds normal. No respiratory distress. He has no wheezes. He has no rales.  Abdominal: Soft. Bowel sounds are normal. He exhibits no distension. There is no abdominal tenderness. There is no rebound and no guarding.  Musculoskeletal:        General: No edema.  Lymphadenopathy:    He has no cervical adenopathy.  Skin: Skin is warm. No rash noted. No erythema. No pallor.  Vitals reviewed.   Patient has bradykinesia. He has no resting tremor. Has a slow shuffling gait and walks with a four-pronged cane.    Assessment & Plan:  The primary encounter diagnosis was Parkinson disease ([place]). Diagnoses of OAB (overactive bladder) and Screening cholesterol level were also pertinent to this visit. Patient's Parkinson's disease is progressing.  His balance is worsening.  He is now dragging his left foot.  I have recommended home health physical therapy evaluation to see if we can do anything to reduce his risk of falls.  He also has an element of overactive bladder with urge incontinence at night.  If possible I would recommend trying him on Myrbetriq but I would wait till we had samples.  I will check his cholesterol level along with a CMP.  Patient's prognosis is unfortunately.

## 2019-04-26 LAB — COMPLETE METABOLIC PANEL WITH GFR
AG Ratio: 2.2 (calc) (ref 1.0–2.5)
ALT: 10 U/L (ref 9–46)
AST: 27 U/L (ref 10–35)
Albumin: 4.3 g/dL (ref 3.6–5.1)
Alkaline phosphatase (APISO): 92 U/L (ref 35–144)
BUN/Creatinine Ratio: 25 (calc) — ABNORMAL HIGH (ref 6–22)
BUN: 17 mg/dL (ref 7–25)
CO2: 24 mmol/L (ref 20–32)
Calcium: 9.6 mg/dL (ref 8.6–10.3)
Chloride: 106 mmol/L (ref 98–110)
Creat: 0.67 mg/dL — ABNORMAL LOW (ref 0.70–1.18)
GFR, Est African American: 111 mL/min/{1.73_m2} (ref >=60)
GFR, Est Non African American: 96 mL/min/{1.73_m2} (ref >=60)
Globulin: 2 g/dL (calc) (ref 1.9–3.7)
Glucose, Bld: 104 mg/dL — ABNORMAL HIGH (ref 65–99)
Potassium: 4.8 mmol/L (ref 3.5–5.3)
Sodium: 141 mmol/L (ref 135–146)
Total Bilirubin: 0.5 mg/dL (ref 0.2–1.2)
Total Protein: 6.3 g/dL (ref 6.1–8.1)

## 2019-04-26 LAB — LIPID PANEL
Cholesterol: 124 mg/dL (ref <200)
HDL: 41 mg/dL (ref >=40)
LDL Cholesterol (Calc): 67 mg/dL (calc)
Non-HDL Cholesterol (Calc): 83 mg/dL (calc) (ref <130)
Total CHOL/HDL Ratio: 3 (calc) (ref <5.0)
Triglycerides: 76 mg/dL (ref <150)

## 2019-04-27 ENCOUNTER — Telehealth: Payer: Self-pay

## 2019-04-27 DIAGNOSIS — M5030 Other cervical disc degeneration, unspecified cervical region: Secondary | ICD-10-CM | POA: Diagnosis not present

## 2019-04-27 DIAGNOSIS — Z9181 History of falling: Secondary | ICD-10-CM | POA: Diagnosis not present

## 2019-04-27 DIAGNOSIS — G629 Polyneuropathy, unspecified: Secondary | ICD-10-CM | POA: Diagnosis not present

## 2019-04-27 DIAGNOSIS — G20 Parkinson's disease: Secondary | ICD-10-CM | POA: Diagnosis not present

## 2019-04-27 NOTE — Telephone Encounter (Signed)
Sonia Baller from [facility] called stating that pt's bp was 92/50 this morning on 08/19. Please advise.

## 2019-04-28 ENCOUNTER — Encounter: Payer: Self-pay | Admitting: *Deleted

## 2019-04-28 NOTE — Telephone Encounter (Signed)
No treatment needed if asymptomatic. Recheck BP today

## 2019-04-29 DIAGNOSIS — Z9181 History of falling: Secondary | ICD-10-CM | POA: Diagnosis not present

## 2019-04-29 DIAGNOSIS — M5030 Other cervical disc degeneration, unspecified cervical region: Secondary | ICD-10-CM | POA: Diagnosis not present

## 2019-04-29 DIAGNOSIS — G629 Polyneuropathy, unspecified: Secondary | ICD-10-CM | POA: Diagnosis not present

## 2019-04-29 DIAGNOSIS — G20 Parkinson's disease: Secondary | ICD-10-CM | POA: Diagnosis not present

## 2019-05-02 ENCOUNTER — Ambulatory Visit: Payer: Medicare Other

## 2019-05-02 ENCOUNTER — Telehealth: Payer: Self-pay | Admitting: Family Medicine

## 2019-05-02 ENCOUNTER — Other Ambulatory Visit: Payer: Self-pay

## 2019-05-02 VITALS — BP 92/50

## 2019-05-02 DIAGNOSIS — Z0130 Encounter for examination of blood pressure without abnormal findings: Secondary | ICD-10-CM

## 2019-05-02 NOTE — Telephone Encounter (Signed)
Patient came in today for a BP check. His BP was 92/56. Patient c/o of feeling "wobbly". Patient's wife has concerns of this being attributed to his decreased fluid intake throughout the day. She also voiced concerns of patient having frequent urination at night. States that he is completely soaking up his bed and depends throughout the night. Please advise?

## 2019-05-02 NOTE — Progress Notes (Signed)
Patient came in today for a BP check. BP at rest while sitting was 92/56. Patient states that he has been feeling "wobbly". His wife states she attributes his BP to dehydration as he does not drink much during the day but sleeps. Message to be sent to patient PCP to inform him of patient BP.

## 2019-05-03 DIAGNOSIS — G20 Parkinson's disease: Secondary | ICD-10-CM | POA: Diagnosis not present

## 2019-05-03 DIAGNOSIS — M5030 Other cervical disc degeneration, unspecified cervical region: Secondary | ICD-10-CM | POA: Diagnosis not present

## 2019-05-03 DIAGNOSIS — Z9181 History of falling: Secondary | ICD-10-CM | POA: Diagnosis not present

## 2019-05-03 DIAGNOSIS — G629 Polyneuropathy, unspecified: Secondary | ICD-10-CM | POA: Diagnosis not present

## 2019-05-03 MED ORDER — FLUDROCORTISONE ACETATE 0.1 MG PO TABS: 0.1000 mg | ORAL_TABLET | Freq: Every day | ORAL | 0 refills | Status: DC

## 2019-05-03 NOTE — Telephone Encounter (Signed)
Let us address the 2 problems one at a time.  First the blood pressure seems to be more pressing.  I would recommend starting fludrocortisone.  Initial: 0.1 mg daily in conjunction with a high-salt diet and adequate fluid intake Recheck blood pressure in 1 week.  If blood pressure improves and he is tolerating the new medication, then we could add something at night to try to help address overactive bladder such as Myrbetriq 25 mg a day.

## 2019-05-03 NOTE — Telephone Encounter (Signed)
Spoke with patient's wife and informed her recommendations by Dr. Dennard Schaumann. Patient wife verbalized understanding. Medication sent to pharmacy.

## 2019-05-05 DIAGNOSIS — M5030 Other cervical disc degeneration, unspecified cervical region: Secondary | ICD-10-CM | POA: Diagnosis not present

## 2019-05-05 DIAGNOSIS — G629 Polyneuropathy, unspecified: Secondary | ICD-10-CM | POA: Diagnosis not present

## 2019-05-05 DIAGNOSIS — Z9181 History of falling: Secondary | ICD-10-CM | POA: Diagnosis not present

## 2019-05-05 DIAGNOSIS — G20 Parkinson's disease: Secondary | ICD-10-CM | POA: Diagnosis not present

## 2019-05-10 ENCOUNTER — Telehealth: Payer: Self-pay | Admitting: Family Medicine

## 2019-05-10 ENCOUNTER — Ambulatory Visit: Payer: Medicare Other

## 2019-05-10 ENCOUNTER — Other Ambulatory Visit: Payer: Self-pay

## 2019-05-10 VITALS — BP 110/62

## 2019-05-10 DIAGNOSIS — Z0130 Encounter for examination of blood pressure without abnormal findings: Secondary | ICD-10-CM

## 2019-05-10 NOTE — Telephone Encounter (Signed)
BP is better.  I would continue the same dose.

## 2019-05-10 NOTE — Progress Notes (Signed)
Patient came in today for a blood pressure check. Patient's BP was 110/62 at rest since starting on Florinef. Message to be sent to the provider.

## 2019-05-10 NOTE — Telephone Encounter (Signed)
Patient came in today for a BP check since starting on fludrocortisone 1 week ago for low blood pressure. Patient's BP was 110/62.

## 2019-05-10 NOTE — Telephone Encounter (Signed)
Patient notified to continue same dose.

## 2019-05-11 ENCOUNTER — Telehealth: Payer: Self-pay | Admitting: Family Medicine

## 2019-05-11 DIAGNOSIS — M5030 Other cervical disc degeneration, unspecified cervical region: Secondary | ICD-10-CM | POA: Diagnosis not present

## 2019-05-11 DIAGNOSIS — Z9181 History of falling: Secondary | ICD-10-CM | POA: Diagnosis not present

## 2019-05-11 DIAGNOSIS — G629 Polyneuropathy, unspecified: Secondary | ICD-10-CM | POA: Diagnosis not present

## 2019-05-11 DIAGNOSIS — G20 Parkinson's disease: Secondary | ICD-10-CM | POA: Diagnosis not present

## 2019-05-11 NOTE — Telephone Encounter (Signed)
Monique Nurse from [facility] called and states that pt is having a drop in BP when standing. BP Sitting - 110/60 BP Standing - 80/56.  Monique CB# [phone]

## 2019-05-12 MED ORDER — FLUDROCORTISONE ACETATE 0.1 MG PO TABS: 0.2000 mg | ORAL_TABLET | Freq: Every day | ORAL | 0 refills | Status: DC

## 2019-05-12 NOTE — Telephone Encounter (Signed)
Spoke with patient's wife and informed her of Dr. Antony Contras recommendations. Patient's wife verbalized understanding. New prescription for 2 tablets of fludrocortisone sent to pharmacy

## 2019-05-12 NOTE — Telephone Encounter (Signed)
This is due to his Parkinson's Disease.  Recommend increasing fludrocortisone to 2 pills a day and wearing compression hose on his legs to prevent drop in bp.  Also encourage fluids.

## 2019-05-12 NOTE — Telephone Encounter (Signed)
Spoke with [place] with [facility] and informed her of recommendations by Dr. Dennard Schaumann. Left message on patient's phone so that I may inform him of recommendations.

## 2019-05-13 DIAGNOSIS — M5030 Other cervical disc degeneration, unspecified cervical region: Secondary | ICD-10-CM | POA: Diagnosis not present

## 2019-05-13 DIAGNOSIS — G20 Parkinson's disease: Secondary | ICD-10-CM | POA: Diagnosis not present

## 2019-05-13 DIAGNOSIS — Z9181 History of falling: Secondary | ICD-10-CM | POA: Diagnosis not present

## 2019-05-13 DIAGNOSIS — G629 Polyneuropathy, unspecified: Secondary | ICD-10-CM | POA: Diagnosis not present

## 2019-05-18 DIAGNOSIS — G629 Polyneuropathy, unspecified: Secondary | ICD-10-CM | POA: Diagnosis not present

## 2019-05-18 DIAGNOSIS — Z9181 History of falling: Secondary | ICD-10-CM | POA: Diagnosis not present

## 2019-05-18 DIAGNOSIS — M5030 Other cervical disc degeneration, unspecified cervical region: Secondary | ICD-10-CM | POA: Diagnosis not present

## 2019-05-18 DIAGNOSIS — G20 Parkinson's disease: Secondary | ICD-10-CM | POA: Diagnosis not present

## 2019-05-20 DIAGNOSIS — G20 Parkinson's disease: Secondary | ICD-10-CM | POA: Diagnosis not present

## 2019-05-20 DIAGNOSIS — Z9181 History of falling: Secondary | ICD-10-CM | POA: Diagnosis not present

## 2019-05-20 DIAGNOSIS — M5030 Other cervical disc degeneration, unspecified cervical region: Secondary | ICD-10-CM | POA: Diagnosis not present

## 2019-05-20 DIAGNOSIS — G629 Polyneuropathy, unspecified: Secondary | ICD-10-CM | POA: Diagnosis not present

## 2019-05-23 ENCOUNTER — Telehealth: Payer: Self-pay | Admitting: Family Medicine

## 2019-05-23 NOTE — Telephone Encounter (Signed)
Alex Frazier from Alex Frazier calling to let you know patient has decided to wait on pt  Because of falling reasons  If you need to call her it is [phone]

## 2019-05-24 DIAGNOSIS — M5030 Other cervical disc degeneration, unspecified cervical region: Secondary | ICD-10-CM | POA: Diagnosis not present

## 2019-05-24 DIAGNOSIS — G629 Polyneuropathy, unspecified: Secondary | ICD-10-CM | POA: Diagnosis not present

## 2019-05-24 DIAGNOSIS — Z9181 History of falling: Secondary | ICD-10-CM | POA: Diagnosis not present

## 2019-05-24 DIAGNOSIS — G20 Parkinson's disease: Secondary | ICD-10-CM | POA: Diagnosis not present

## 2019-05-24 NOTE — Telephone Encounter (Signed)
FYI

## 2019-05-27 DIAGNOSIS — G20 Parkinson's disease: Secondary | ICD-10-CM | POA: Diagnosis not present

## 2019-05-27 DIAGNOSIS — G629 Polyneuropathy, unspecified: Secondary | ICD-10-CM | POA: Diagnosis not present

## 2019-05-27 DIAGNOSIS — Z9181 History of falling: Secondary | ICD-10-CM | POA: Diagnosis not present

## 2019-05-27 DIAGNOSIS — M5030 Other cervical disc degeneration, unspecified cervical region: Secondary | ICD-10-CM | POA: Diagnosis not present

## 2019-05-30 ENCOUNTER — Other Ambulatory Visit: Payer: Self-pay

## 2019-05-31 ENCOUNTER — Encounter: Payer: Self-pay | Admitting: Family Medicine

## 2019-05-31 ENCOUNTER — Ambulatory Visit (INDEPENDENT_AMBULATORY_CARE_PROVIDER_SITE_OTHER): Payer: Medicare Other | Admitting: Family Medicine

## 2019-05-31 VITALS — BP 80/40 | HR 68 | Temp 98.6°F | Resp 14 | Ht 68.0 in

## 2019-05-31 DIAGNOSIS — G20 Parkinson's disease: Secondary | ICD-10-CM | POA: Diagnosis not present

## 2019-05-31 DIAGNOSIS — Z23 Encounter for immunization: Secondary | ICD-10-CM

## 2019-05-31 DIAGNOSIS — G239 Degenerative disease of basal ganglia, unspecified: Secondary | ICD-10-CM | POA: Diagnosis not present

## 2019-05-31 DIAGNOSIS — I951 Orthostatic hypotension: Secondary | ICD-10-CM | POA: Diagnosis not present

## 2019-05-31 DIAGNOSIS — G20A1 Parkinson's disease without dyskinesia, without mention of fluctuations: Secondary | ICD-10-CM

## 2019-05-31 DIAGNOSIS — G238 Other specified degenerative diseases of basal ganglia: Secondary | ICD-10-CM

## 2019-05-31 DIAGNOSIS — G903 Multi-system degeneration of the autonomic nervous system: Secondary | ICD-10-CM

## 2019-05-31 MED ORDER — MIDODRINE HCL 5 MG PO TABS: 5.0000 mg | ORAL_TABLET | Freq: Three times a day (TID) | ORAL | 1 refills | Status: DC

## 2019-05-31 NOTE — Progress Notes (Signed)
Subjective:    Patient ID: Alex Frazier, male    DOB: 05/30/1946, 73 y.o.   MRN: [id number]  Recently the patient has been found to have orthostatic hypotension.  However he denies any other problems.  He denies any signs of illness.  He denies any cough.  He denies any shortness of breath.  He denies any chest congestion or pleurisy.  He denies any nausea vomiting or diarrhea.  He denies any dysuria or urgency although he does have urinary incontinence.  The patient does not appear systemically ill or toxic to suggest septic shock.  He denies any chest pain or shortness of breath or dyspnea on exertion.  There is no pulmonary edema today on exam.  There is no peripheral edema on exam and therefore I do not suspect cardiogenic shock.  He does have Parkinson's disease and is on Sinemet.  I suspect an element of autonomic dysregulation causing this.  Recently I started the patient on fludrocortisone and increase to 0.2 mg after the first dose was unsuccessful.  He is currently taking 0.2 mg daily and also wearing knee-high compression hose however he continues to experience hypotension.  Other than being dizzy of standing and feeling tired he denies any other symptoms.  He denies any syncope or near syncope or shortness of breath.  He denies any melena or hematochezia.  He denies any signs of blood loss. Past Medical History:  Diagnosis Date  . B12 deficiency    Borderline  . Bulging discs   . Gastritis   . Hyperlipidemia   . Neuropathy   . Parkinsonian syndrome ([place])   . Peripheral neuropathy    Current Outpatient Medications on File Prior to Visit  Medication Sig Dispense Refill  . b complex vitamins tablet Take 1,000 tablets by mouth daily.     . carbidopa-levodopa (SINEMET IR) 25-100 MG tablet 2.5 tablets 3 (three) times daily.     . Carbidopa-Levodopa ER (SINEMET CR) 25-100 MG tablet controlled release Take 1 tablet by mouth at bedtime.  2  . donepezil (ARICEPT) 5 MG tablet TK 1 T PO   NIGHTLY    . DULoxetine (CYMBALTA) 60 MG capsule Take 1 capsule (60 mg total) by mouth daily. 90 capsule 3  . Fish Oil-Cholecalciferol (OMEGA-3 FISH OIL/VITAMIN D3) 1000-1000 MG-UNIT CAPS Take by mouth.    . fludrocortisone (FLORINEF) 0.1 MG tablet Take 2 tablets (0.2 mg total) by mouth daily. 180 tablet 0  . fluticasone (FLONASE) 50 MCG/ACT nasal spray Place 2 sprays into both nostrils daily. 16 g 6  . Polyethylene Glycol 3350 (MIRALAX PO) Take by mouth.    . pravastatin (PRAVACHOL) 20 MG tablet TAKE 1 TABLET BY MOUTH DAILY AT 6 PM GENERIC EQUIVALENT FOR PRAVACHOL 90 tablet 1   No current facility-administered medications on file prior to visit.    Allergies  Allergen Reactions  . Indomethacin   . Lyrica [Pregabalin]    Social History   Socioeconomic History  . Marital status: Married    Spouse name: Not on file  . Number of children: Not on file  . Years of education: Not on file  . Highest education level: Not on file  Occupational History  . Not on file  Social Needs  . Financial resource strain: Not on file  . Food insecurity    Worry: Not on file    Inability: Not on file  . Transportation needs    Medical: Not on file    Non-medical: Not on file  Tobacco Use  . Smoking status: Never Smoker  . Smokeless tobacco: Never Used  Substance and Sexual Activity  . Alcohol use: No  . Drug use: No  . Sexual activity: Not on file  Lifestyle  . Physical activity    Days per week: Not on file    Minutes per session: Not on file  . Stress: Not on file  Relationships  . Social Herbalist on phone: Not on file    Gets together: Not on file    Attends religious service: Not on file    Active member of club or organization: Not on file    Attends meetings of clubs or organizations: Not on file    Relationship status: Not on file  . Intimate partner violence    Fear of current or ex partner: Not on file    Emotionally abused: Not on file    Physically abused: Not  on file    Forced sexual activity: Not on file  Other Topics Concern  . Not on file  Social History Narrative  . Not on file     Review of Systems  All other systems reviewed and are negative.      Objective:   Physical Exam  Constitutional: He appears well-developed and well-nourished.  HENT:  Nose: Nose normal.  Mouth/Throat: Oropharynx is clear and moist.  Eyes: Conjunctivae are normal. No scleral icterus.  Neck: Neck supple. No JVD present. No thyromegaly present.  Cardiovascular: Normal rate and regular rhythm. Exam reveals no friction rub.  No murmur heard. Pulmonary/Chest: Effort normal and breath sounds normal. No respiratory distress. He has no wheezes. He has no rales.  Abdominal: Soft. Bowel sounds are normal. He exhibits no distension. There is no abdominal tenderness. There is no rebound and no guarding.  Musculoskeletal:        General: No edema.  Lymphadenopathy:    He has no cervical adenopathy.  Skin: Skin is warm. No rash noted. No erythema. No pallor.  Vitals reviewed.   Patient has bradykinesia. He has no resting tremor. Has a slow shuffling gait and walks with a four-pronged cane.    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  I see no evidence of septic shock.  I see no evidence of cardiogenic shock.  The patient does not appear dehydrated.  He does not appear anemic.  I will check a urinalysis to rule out a urinary tract infection.  I will also check a CMP to evaluate for any evidence of dehydration and a CBC to  check for any evidence of anemia.  Meanwhile I will add Midodrine 5 mg p.o. 3 times daily for systolic blood pressures less than 100.  Reassess in 1 week and if blood pressure remains low, consult neurology for a second opinion as to whether we should decrease Sinemet as this can cause orthostatic hypotension as well.  However the patient is relatively asymptomatic despite the impressive hypotension

## 2019-06-01 ENCOUNTER — Other Ambulatory Visit: Payer: Self-pay

## 2019-06-01 ENCOUNTER — Other Ambulatory Visit: Payer: Medicare Other

## 2019-06-01 DIAGNOSIS — I951 Orthostatic hypotension: Secondary | ICD-10-CM | POA: Diagnosis not present

## 2019-06-01 LAB — CBC WITH DIFFERENTIAL/PLATELET
Absolute Monocytes: 636 cells/uL (ref 200–950)
Basophils Absolute: 48 cells/uL (ref 0–200)
Basophils Relative: 0.9 %
Eosinophils Absolute: 21 cells/uL (ref 15–500)
Eosinophils Relative: 0.4 %
HCT: 42.1 % (ref 38.5–50.0)
Hemoglobin: 14.3 g/dL (ref 13.2–17.1)
Lymphs Abs: 784 cells/uL — ABNORMAL LOW (ref 850–3900)
MCH: 30 pg (ref 27.0–33.0)
MCHC: 34 g/dL (ref 32.0–36.0)
MCV: 88.3 fL (ref 80.0–100.0)
MPV: 10.2 fL (ref 7.5–12.5)
Monocytes Relative: 12 %
Neutro Abs: 3811 cells/uL (ref 1500–7800)
Neutrophils Relative %: 71.9 %
Platelets: 274 10*3/uL (ref 140–400)
RBC: 4.77 10*6/uL (ref 4.20–5.80)
RDW: 12.3 % (ref 11.0–15.0)
Total Lymphocyte: 14.8 %
WBC: 5.3 10*3/uL (ref 3.8–10.8)

## 2019-06-01 LAB — COMPLETE METABOLIC PANEL WITH GFR
AG Ratio: 1.8 (calc) (ref 1.0–2.5)
ALT: 5 U/L — ABNORMAL LOW (ref 9–46)
AST: 17 U/L (ref 10–35)
Albumin: 4.1 g/dL (ref 3.6–5.1)
Alkaline phosphatase (APISO): 104 U/L (ref 35–144)
BUN: 15 mg/dL (ref 7–25)
CO2: 29 mmol/L (ref 20–32)
Calcium: 9.4 mg/dL (ref 8.6–10.3)
Chloride: 104 mmol/L (ref 98–110)
Creat: 0.79 mg/dL (ref 0.70–1.18)
GFR, Est African American: 104 mL/min/{1.73_m2} (ref >=60)
GFR, Est Non African American: 90 mL/min/{1.73_m2} (ref >=60)
Globulin: 2.3 g/dL (calc) (ref 1.9–3.7)
Glucose, Bld: 93 mg/dL (ref 65–99)
Potassium: 4.7 mmol/L (ref 3.5–5.3)
Sodium: 142 mmol/L (ref 135–146)
Total Bilirubin: 0.5 mg/dL (ref 0.2–1.2)
Total Protein: 6.4 g/dL (ref 6.1–8.1)

## 2019-06-01 LAB — URINALYSIS, ROUTINE W REFLEX MICROSCOPIC
Bilirubin Urine: NEGATIVE
Glucose, UA: NEGATIVE
Hgb urine dipstick: NEGATIVE
Ketones, ur: NEGATIVE
Leukocytes,Ua: NEGATIVE
Nitrite: NEGATIVE
Protein, ur: NEGATIVE
Specific Gravity, Urine: 1.015 (ref 1.001–1.03)
pH: 7.5 (ref 5.0–8.0)

## 2019-06-02 DIAGNOSIS — G629 Polyneuropathy, unspecified: Secondary | ICD-10-CM | POA: Diagnosis not present

## 2019-06-02 DIAGNOSIS — Z9181 History of falling: Secondary | ICD-10-CM | POA: Diagnosis not present

## 2019-06-02 DIAGNOSIS — G20 Parkinson's disease: Secondary | ICD-10-CM | POA: Diagnosis not present

## 2019-06-02 DIAGNOSIS — M5030 Other cervical disc degeneration, unspecified cervical region: Secondary | ICD-10-CM | POA: Diagnosis not present

## 2019-06-06 ENCOUNTER — Telehealth: Payer: Self-pay | Admitting: Family Medicine

## 2019-06-06 DIAGNOSIS — G20 Parkinson's disease: Secondary | ICD-10-CM | POA: Diagnosis not present

## 2019-06-06 DIAGNOSIS — G629 Polyneuropathy, unspecified: Secondary | ICD-10-CM | POA: Diagnosis not present

## 2019-06-06 DIAGNOSIS — Z9181 History of falling: Secondary | ICD-10-CM | POA: Diagnosis not present

## 2019-06-06 DIAGNOSIS — M5030 Other cervical disc degeneration, unspecified cervical region: Secondary | ICD-10-CM | POA: Diagnosis not present

## 2019-06-06 NOTE — Telephone Encounter (Signed)
Received a call from [place] with [facility] stating that patient's BP was 90/50 at 4:15. Patient had taken midodrine at 2:45PM . Patient denies any dizziness, or weakness. Please advise?  Elmyra Ricks  [phone]

## 2019-06-06 NOTE — Telephone Encounter (Signed)
I would recommend no additional treatment at this time if no symptoms.  This is due to his Parkinson's disease and will continue to occur.  Check BP if symptomatic

## 2019-06-07 NOTE — Telephone Encounter (Signed)
Left message return call

## 2019-06-07 NOTE — Telephone Encounter (Signed)
Antionette Fairy nurse with [facility] and informed her of recommendations by Dr.Pickard. She verbalized understanding.

## 2019-06-10 DIAGNOSIS — M5030 Other cervical disc degeneration, unspecified cervical region: Secondary | ICD-10-CM | POA: Diagnosis not present

## 2019-06-10 DIAGNOSIS — Z9181 History of falling: Secondary | ICD-10-CM | POA: Diagnosis not present

## 2019-06-10 DIAGNOSIS — G629 Polyneuropathy, unspecified: Secondary | ICD-10-CM | POA: Diagnosis not present

## 2019-06-10 DIAGNOSIS — G20 Parkinson's disease: Secondary | ICD-10-CM | POA: Diagnosis not present

## 2019-06-14 DIAGNOSIS — G629 Polyneuropathy, unspecified: Secondary | ICD-10-CM | POA: Diagnosis not present

## 2019-06-14 DIAGNOSIS — M5030 Other cervical disc degeneration, unspecified cervical region: Secondary | ICD-10-CM | POA: Diagnosis not present

## 2019-06-14 DIAGNOSIS — G20 Parkinson's disease: Secondary | ICD-10-CM | POA: Diagnosis not present

## 2019-06-14 DIAGNOSIS — Z9181 History of falling: Secondary | ICD-10-CM | POA: Diagnosis not present

## 2019-06-16 DIAGNOSIS — G20 Parkinson's disease: Secondary | ICD-10-CM | POA: Diagnosis not present

## 2019-06-16 DIAGNOSIS — G629 Polyneuropathy, unspecified: Secondary | ICD-10-CM | POA: Diagnosis not present

## 2019-06-16 DIAGNOSIS — Z9181 History of falling: Secondary | ICD-10-CM | POA: Diagnosis not present

## 2019-06-16 DIAGNOSIS — M5030 Other cervical disc degeneration, unspecified cervical region: Secondary | ICD-10-CM | POA: Diagnosis not present

## 2019-06-21 DIAGNOSIS — G629 Polyneuropathy, unspecified: Secondary | ICD-10-CM | POA: Diagnosis not present

## 2019-06-21 DIAGNOSIS — M5030 Other cervical disc degeneration, unspecified cervical region: Secondary | ICD-10-CM | POA: Diagnosis not present

## 2019-06-21 DIAGNOSIS — Z9181 History of falling: Secondary | ICD-10-CM | POA: Diagnosis not present

## 2019-06-21 DIAGNOSIS — G20 Parkinson's disease: Secondary | ICD-10-CM | POA: Diagnosis not present

## 2019-06-23 DIAGNOSIS — Z9181 History of falling: Secondary | ICD-10-CM | POA: Diagnosis not present

## 2019-06-23 DIAGNOSIS — M5030 Other cervical disc degeneration, unspecified cervical region: Secondary | ICD-10-CM | POA: Diagnosis not present

## 2019-06-23 DIAGNOSIS — G20 Parkinson's disease: Secondary | ICD-10-CM | POA: Diagnosis not present

## 2019-06-23 DIAGNOSIS — G629 Polyneuropathy, unspecified: Secondary | ICD-10-CM | POA: Diagnosis not present

## 2019-06-26 DIAGNOSIS — M5030 Other cervical disc degeneration, unspecified cervical region: Secondary | ICD-10-CM | POA: Diagnosis not present

## 2019-06-26 DIAGNOSIS — G629 Polyneuropathy, unspecified: Secondary | ICD-10-CM | POA: Diagnosis not present

## 2019-06-26 DIAGNOSIS — G20 Parkinson's disease: Secondary | ICD-10-CM | POA: Diagnosis not present

## 2019-06-26 DIAGNOSIS — Z9181 History of falling: Secondary | ICD-10-CM | POA: Diagnosis not present

## 2019-06-26 DIAGNOSIS — I951 Orthostatic hypotension: Secondary | ICD-10-CM | POA: Diagnosis not present

## 2019-06-28 ENCOUNTER — Other Ambulatory Visit: Payer: Self-pay | Admitting: Family Medicine

## 2019-06-28 ENCOUNTER — Telehealth: Payer: Self-pay | Admitting: Family Medicine

## 2019-06-28 DIAGNOSIS — G629 Polyneuropathy, unspecified: Secondary | ICD-10-CM | POA: Diagnosis not present

## 2019-06-28 DIAGNOSIS — M5030 Other cervical disc degeneration, unspecified cervical region: Secondary | ICD-10-CM | POA: Diagnosis not present

## 2019-06-28 DIAGNOSIS — Z9181 History of falling: Secondary | ICD-10-CM | POA: Diagnosis not present

## 2019-06-28 DIAGNOSIS — G20 Parkinson's disease: Secondary | ICD-10-CM | POA: Diagnosis not present

## 2019-06-28 DIAGNOSIS — M5412 Radiculopathy, cervical region: Secondary | ICD-10-CM

## 2019-06-28 DIAGNOSIS — I951 Orthostatic hypotension: Secondary | ICD-10-CM | POA: Diagnosis not present

## 2019-06-28 MED ORDER — PREDNISONE 20 MG PO TABS: ORAL_TABLET | ORAL | 0 refills | Status: DC

## 2019-06-28 NOTE — Telephone Encounter (Signed)
Spoke to patient's wife regarding his situation.  3 weeks ago patient fell and struck his neck and his left shoulder against the wall as he fell.  Since that time he has been complaining of pain in his neck and at the base of his head radiating into his left arm.  She denies any loss of consciousness or altered mental status.  The pain is mild.  She states that he reports it is a 2 or 3 on a scale of 1-10 however he is having what sounds like neuropathic pain radiating from his neck into his left shoulder.  Therefore I recommended starting him on a prednisone taper pack for a possible herniated disc in his neck.  Of also recommended temporarily discontinuing the fludrocortisone while taking the prednisone.  I recommended that he go get an x-ray of his cervical spine and his left shoulder to rule out an occult fracture.  Patient's wife is in agreement as is the patient.

## 2019-06-28 NOTE — Telephone Encounter (Signed)
Pt's wife called and states that pt is having a lot of headaches and neck pain. She states that she started giving him Aleve but that did not help much so she switched to motrin and that has not helped much. She does not know what to do for him as it is hard for him to get out and explain to her what is the matter and she has a hard time understanding what he is trying to say. She does not want to carry him to see another md so if you could please help her. If she needs to bring him in she will be happy to do so. (she thinks that the HA's are secondary to all his falls)   CB# [phone]

## 2019-06-29 ENCOUNTER — Other Ambulatory Visit: Payer: Self-pay | Admitting: Family Medicine

## 2019-06-29 ENCOUNTER — Ambulatory Visit
Admission: RE | Admit: 2019-06-29 | Discharge: 2019-06-29 | Disposition: A | Discharge status: Home / Self Care | Payer: Medicare Other | Source: Ambulatory Visit | Attending: Family Medicine | Admitting: Family Medicine

## 2019-06-29 DIAGNOSIS — M5412 Radiculopathy, cervical region: Secondary | ICD-10-CM

## 2019-06-29 DIAGNOSIS — M25512 Pain in left shoulder: Secondary | ICD-10-CM | POA: Diagnosis not present

## 2019-06-29 DIAGNOSIS — S4992XA Unspecified injury of left shoulder and upper arm, initial encounter: Secondary | ICD-10-CM | POA: Diagnosis not present

## 2019-06-29 DIAGNOSIS — M542 Cervicalgia: Secondary | ICD-10-CM | POA: Diagnosis not present

## 2019-06-30 ENCOUNTER — Encounter: Payer: Self-pay | Admitting: Family Medicine

## 2019-06-30 DIAGNOSIS — M5412 Radiculopathy, cervical region: Secondary | ICD-10-CM

## 2019-06-30 MED ORDER — PREDNISONE 20 MG PO TABS: ORAL_TABLET | ORAL | 0 refills | Status: DC

## 2019-07-12 DIAGNOSIS — G20 Parkinson's disease: Secondary | ICD-10-CM | POA: Diagnosis not present

## 2019-07-15 ENCOUNTER — Other Ambulatory Visit: Payer: Self-pay

## 2019-07-26 ENCOUNTER — Other Ambulatory Visit: Payer: Self-pay

## 2019-07-26 ENCOUNTER — Emergency Department (HOSPITAL_COMMUNITY)
Admission: EM | Admit: 2019-07-26 | Discharge: 2019-07-27 | Disposition: A | Discharge status: Home / Self Care | Payer: Medicare Other | Attending: Emergency Medicine | Admitting: Emergency Medicine

## 2019-07-26 ENCOUNTER — Telehealth: Payer: Self-pay

## 2019-07-26 ENCOUNTER — Emergency Department (HOSPITAL_COMMUNITY): Payer: Medicare Other

## 2019-07-26 DIAGNOSIS — R001 Bradycardia, unspecified: Secondary | ICD-10-CM | POA: Diagnosis not present

## 2019-07-26 DIAGNOSIS — I951 Orthostatic hypotension: Secondary | ICD-10-CM | POA: Diagnosis not present

## 2019-07-26 DIAGNOSIS — I959 Hypotension, unspecified: Secondary | ICD-10-CM | POA: Diagnosis not present

## 2019-07-26 DIAGNOSIS — M5030 Other cervical disc degeneration, unspecified cervical region: Secondary | ICD-10-CM | POA: Diagnosis not present

## 2019-07-26 DIAGNOSIS — G20 Parkinson's disease: Secondary | ICD-10-CM | POA: Insufficient documentation

## 2019-07-26 DIAGNOSIS — R4701 Aphasia: Secondary | ICD-10-CM | POA: Diagnosis not present

## 2019-07-26 DIAGNOSIS — Z79899 Other long term (current) drug therapy: Secondary | ICD-10-CM | POA: Diagnosis not present

## 2019-07-26 DIAGNOSIS — R29818 Other symptoms and signs involving the nervous system: Secondary | ICD-10-CM | POA: Diagnosis not present

## 2019-07-26 DIAGNOSIS — G629 Polyneuropathy, unspecified: Secondary | ICD-10-CM | POA: Diagnosis not present

## 2019-07-26 DIAGNOSIS — R4781 Slurred speech: Secondary | ICD-10-CM | POA: Diagnosis not present

## 2019-07-26 DIAGNOSIS — Z9181 History of falling: Secondary | ICD-10-CM | POA: Diagnosis not present

## 2019-07-26 LAB — I-STAT CHEM 8, ED
BUN: 15 mg/dL (ref 8–23)
Calcium, Ion: 1.13 mmol/L — ABNORMAL LOW (ref 1.15–1.40)
Chloride: 102 mmol/L (ref 98–111)
Creatinine, Ser: 0.7 mg/dL (ref 0.61–1.24)
Glucose, Bld: 87 mg/dL (ref 70–99)
HCT: 42 % (ref 39.0–52.0)
Hemoglobin: 14.3 g/dL (ref 13.0–17.0)
Potassium: 4.1 mmol/L (ref 3.5–5.1)
Sodium: 139 mmol/L (ref 135–145)
TCO2: 26 mmol/L (ref 22–32)

## 2019-07-26 LAB — DIFFERENTIAL
Abs Immature Granulocytes: 0.02 10*3/uL (ref 0.00–0.07)
Basophils Absolute: 0.1 10*3/uL (ref 0.0–0.1)
Basophils Relative: 1 %
Eosinophils Absolute: 0.1 10*3/uL (ref 0.0–0.5)
Eosinophils Relative: 2 %
Immature Granulocytes: 0 %
Lymphocytes Relative: 20 %
Lymphs Abs: 1.1 10*3/uL (ref 0.7–4.0)
Monocytes Absolute: 0.6 10*3/uL (ref 0.1–1.0)
Monocytes Relative: 11 %
Neutro Abs: 3.6 10*3/uL (ref 1.7–7.7)
Neutrophils Relative %: 66 %

## 2019-07-26 LAB — COMPREHENSIVE METABOLIC PANEL
ALT: 6 U/L (ref 0–44)
AST: 22 U/L (ref 15–41)
Albumin: 4 g/dL (ref 3.5–5.0)
Alkaline Phosphatase: 110 U/L (ref 38–126)
Anion gap: 10 (ref 5–15)
BUN: 13 mg/dL (ref 8–23)
CO2: 25 mmol/L (ref 22–32)
Calcium: 9.2 mg/dL (ref 8.9–10.3)
Chloride: 103 mmol/L (ref 98–111)
Creatinine, Ser: 0.81 mg/dL (ref 0.61–1.24)
GFR calc Af Amer: >60 mL/min (ref >60)
GFR calc non Af Amer: >60 mL/min (ref >60)
Glucose, Bld: 92 mg/dL (ref 70–99)
Potassium: 4.1 mmol/L (ref 3.5–5.1)
Sodium: 138 mmol/L (ref 135–145)
Total Bilirubin: 0.4 mg/dL (ref 0.3–1.2)
Total Protein: 6.2 g/dL — ABNORMAL LOW (ref 6.5–8.1)

## 2019-07-26 LAB — PROTIME-INR
INR: 1 (ref 0.8–1.2)
Prothrombin Time: 13.3 seconds (ref 11.4–15.2)

## 2019-07-26 LAB — CBC
HCT: 43.7 % (ref 39.0–52.0)
Hemoglobin: 14.6 g/dL (ref 13.0–17.0)
MCH: 30.4 pg (ref 26.0–34.0)
MCHC: 33.4 g/dL (ref 30.0–36.0)
MCV: 91 fL (ref 80.0–100.0)
Platelets: 248 10*3/uL (ref 150–400)
RBC: 4.8 MIL/uL (ref 4.22–5.81)
RDW: 13 % (ref 11.5–15.5)
WBC: 5.5 10*3/uL (ref 4.0–10.5)
nRBC: 0 % (ref 0.0–0.2)

## 2019-07-26 LAB — APTT: aPTT: 25 seconds (ref 24–36)

## 2019-07-26 MED ORDER — SODIUM CHLORIDE 0.9% FLUSH: 3.0000 mL | Freq: Once | INTRAVENOUS | Status: DC

## 2019-07-26 NOTE — ED Triage Notes (Signed)
Pt had 10 min episode of aphasia and slurred speech, witnessed by speech therapist this morning. Symptoms resolved. Pt remembers episode, feels totally normal now. Hx parkinsons.

## 2019-07-26 NOTE — ED Notes (Signed)
PT. cbg 86.

## 2019-07-26 NOTE — ED Notes (Signed)
Alex Frazier (Wife#[phone]).

## 2019-07-26 NOTE — ED Notes (Signed)
This nurse called [facility] for possible additional orders.  No c/o aphasia since onset at around 1pm today.

## 2019-07-26 NOTE — Telephone Encounter (Signed)
Deidra (RN) from [facility] called and left a VM to report that pt is slurring his words and his bp is 76/50. I called back and left a VM to send him to the ED for evaluation.

## 2019-07-27 DIAGNOSIS — R4701 Aphasia: Secondary | ICD-10-CM | POA: Diagnosis not present

## 2019-07-27 DIAGNOSIS — R4781 Slurred speech: Secondary | ICD-10-CM | POA: Diagnosis not present

## 2019-07-27 NOTE — ED Notes (Signed)
Patient verbalizes understanding of discharge instructions. Opportunity for questioning and answers were provided. Armband removed by staff, pt discharged from ED.  

## 2019-07-27 NOTE — ED Provider Notes (Signed)
[facility] EMERGENCY DEPARTMENT Provider Note  CSN: [id number] Arrival date & time: 07/26/19 1632  Chief Complaint(s) Aphasia and Hypotension  HPI Alex Frazier is a 73 y.o. male with a history of Parkinson's disease on Sinemet who was recently started on midodrine and Solu-Cortef for orthostatic hypotension presents to the emergency department for sudden onset slurred speech in the setting of hypotension.  Patient was starting a speech therapy session when he began slurring his speech and slobbering.  The speech therapist checked the patient's blood pressure and noted that systolics were in the [id number].  This episode lasted approximately 15 minutes.  EMS was called who gave the patient IV fluid bolus.  Symptoms have not recurred.  Since here patient's blood pressures have been stable.  They deny recent fevers or infections.  No recent vomiting or diarrhea.  No associated chest pain or shortness of breath.  No prior history of CVA.  Wife did report that around the time when his orthostatic hypotension began, they had decreased his Sinemet.  HPI  Past Medical History Past Medical History:  Diagnosis Date   B12 deficiency    Borderline   Bulging discs    Gastritis    Hyperlipidemia    Neuropathy    Parkinsonian syndrome ([place])    Peripheral neuropathy    Patient Active Problem List   Diagnosis Date Noted   DDD (degenerative disc disease), cervical 04/07/2016   Neuropathy    Bulging discs    Gastritis    Parkinsonian syndrome (HCC)    Hyperlipidemia    Home Medication(s) Prior to Admission medications   Medication Sig Start Date End Date Taking? Authorizing Provider  b complex vitamins tablet Take 1,000 tablets by mouth daily.     [provider]  carbidopa-levodopa (SINEMET IR) 25-100 MG tablet 2.5 tablets 3 (three) times daily.  09/07/15   [provider]  Carbidopa-Levodopa ER (SINEMET CR) 25-100 MG tablet controlled release Take  1 tablet by mouth at bedtime. 07/07/15   [provider]  donepezil (ARICEPT) 5 MG tablet TK 1 T PO  NIGHTLY 04/19/19   [provider]  DULoxetine (CYMBALTA) 60 MG capsule Take 1 capsule (60 mg total) by mouth daily. 08/17/18   Susy Frizzle, MD  Fish Oil-Cholecalciferol (OMEGA-3 FISH OIL/VITAMIN D3) 1000-1000 MG-UNIT CAPS Take by mouth.    [provider]  fludrocortisone (FLORINEF) 0.1 MG tablet Take 2 tablets (0.2 mg total) by mouth daily. 05/12/19   Susy Frizzle, MD  fluticasone (FLONASE) 50 MCG/ACT nasal spray Place 2 sprays into both nostrils daily. 01/09/14   Susy Frizzle, MD  midodrine (PROAMATINE) 5 MG tablet Take 1 tablet (5 mg total) by mouth 3 (three) times daily with meals. 05/31/19   Susy Frizzle, MD  Polyethylene Glycol 3350 (MIRALAX PO) Take by mouth.    [provider]  pravastatin (PRAVACHOL) 20 MG tablet TAKE 1 TABLET BY MOUTH DAILY AT 6 PM GENERIC EQUIVALENT FOR PRAVACHOL 10/27/18   Susy Frizzle, MD  predniSONE (DELTASONE) 20 MG tablet TAKE 3 TABLETS BY MOUTH EVERY DAY FOR 1 TO 2 DAYS, 2 TABLETS BY MOUTH EVERY DAY FOR DAYS 3 TO 4, 1 TABLET BY MOUTH EVERY DAY FOR DAYS 5 TO 6. 06/30/19   Susy Frizzle, MD  Past Surgical History Past Surgical History:  Procedure Laterality Date   APPENDECTOMY     BACK SURGERY     Family History Family History  Problem Relation Age of Onset   Parkinson's disease Mother    Parkinson's disease Father    Diabetes Sister    Heart disease Brother     Social History Social History   Tobacco Use   Smoking status: Never Smoker   Smokeless tobacco: Never Used  Substance Use Topics   Alcohol use: No   Drug use: No   Allergies Indomethacin and Lyrica [pregabalin]  Review of Systems Review of Systems All other systems are reviewed and are  negative for acute change except as noted in the HPI  Physical Exam Vital Signs  I have reviewed the triage vital signs BP (!) 147/75 (BP Location: Right Arm)    Pulse 85    Temp 98.7 F (37.1 C) (Oral)    Resp 12    SpO2 99%   Physical Exam Vitals signs reviewed.  Constitutional:      General: He is not in acute distress.    Appearance: He is well-developed. He is not diaphoretic.  HENT:     Head: Normocephalic and atraumatic.     Nose: Nose normal.  Eyes:     General: No scleral icterus.       Right eye: No discharge.        Left eye: No discharge.     Conjunctiva/sclera: Conjunctivae normal.     Pupils: Pupils are equal, round, and reactive to light.  Neck:     Musculoskeletal: Normal range of motion and neck supple.  Cardiovascular:     Rate and Rhythm: Normal rate and regular rhythm.     Heart sounds: No murmur. No friction rub. No gallop.   Pulmonary:     Effort: Pulmonary effort is normal. No respiratory distress.     Breath sounds: Normal breath sounds. No stridor. No rales.  Abdominal:     General: There is no distension.     Palpations: Abdomen is soft.     Tenderness: There is no abdominal tenderness.  Musculoskeletal:        General: No tenderness.  Skin:    General: Skin is warm and dry.     Findings: No erythema or rash.  Neurological:     Mental Status: He is alert and oriented to person, place, and time.     Comments: Mental Status:  Alert and oriented to person, place, and time.  Attention and concentration normal.  Speech clear.  Recent memory is intact  Cranial Nerves:  II Visual Fields: Intact to confrontation. Visual fields intact. III, IV, VI: Pupils equal and reactive to light and near. Full eye movement without nystagmus  V Facial Sensation: Normal. No weakness of masticatory muscles  VII: No facial weakness or asymmetry  VIII Auditory Acuity: Grossly normal  IX/X: The uvula is midline; the palate elevates symmetrically  XI: Normal  sternocleidomastoid and trapezius strength  XII: The tongue is midline. No atrophy or fasciculations.   Motor System: Muscle Strength: 5/5 and symmetric in the upper and lower extremities. No pronation or drift.  Muscle Tone: Tone and muscle bulk are normal in the upper and lower extremities.   Reflexes: DTRs:  No Clonus Coordination: Intact finger-to-nose. Resting tremor Sensation: Intact to light touch..  Gait: deferred      ED Results and Treatments Labs (all labs ordered are listed, but only abnormal results are  displayed) Labs Reviewed  COMPREHENSIVE METABOLIC PANEL - Abnormal; Notable for the following components:      Result Value   Total Protein 6.2 (*)    All other components within normal limits  I-STAT CHEM 8, ED - Abnormal; Notable for the following components:   Calcium, Ion 1.13 (*)    All other components within normal limits  PROTIME-INR  APTT  CBC  DIFFERENTIAL  CBG MONITORING, ED                                                                                                                         EKG  EKG Interpretation  Date/Time:  Wednesday July 27 2019 01:26:01 EST Ventricular Rate:  65 PR Interval:    QRS Duration: 155 QT Interval:  433 QTC Calculation: 451 R Axis:   9 Text Interpretation: Sinus rhythm Consider left atrial enlargement Right bundle branch block NO STEMI. Confirmed by Addison Lank [phone]) on 07/27/2019 1:34:41 AM      Radiology Ct Head Wo Contrast  Result Date: 07/26/2019 CLINICAL DATA:  Focal neuro deficit less than 6 hours, stroke suspected, transient aphasia and slurred speech, history of Parkinson's EXAM: CT HEAD WITHOUT CONTRAST TECHNIQUE: Contiguous axial images were obtained from the base of the skull through the vertex without intravenous contrast. COMPARISON:  None. FINDINGS: Brain: No evidence of acute infarction, hemorrhage, hydrocephalus, extra-axial collection or mass lesion/mass effect. Symmetric prominence of  the ventricles, cisterns and sulci compatible with frontal predominant parenchymal volume loss. Patchy areas of white matter hypoattenuation are most compatible with chronic microvascular angiopathy. Vascular: Atherosclerotic calcification of the carotid siphons and intradural vertebral arteries. No hyperdense vessel. Skull: No calvarial fracture or suspicious osseous lesion. No scalp swelling or hematoma. Sinuses/Orbits: Paranasal sinuses and mastoid air cells are predominantly clear. Orbital structures are unremarkable aside from prior lens extractions. Other: Right TMJ arthrosis. IMPRESSION: 1. No CT evidence of infarct or other acute intracranial abnormality. If persisting clinical concern for ischemia, MRI is more sensitive and specific for early changes. 2. Frontal predominant parenchymal volume loss and chronic microvascular angiopathy changes. 3. Right TMJ arthrosis. Electronically Signed   By: Lovena Le M.D.   On: 07/26/2019 17:30   Mr Brain Wo Contrast  Result Date: 07/27/2019 CLINICAL DATA:  73 year old male with episode of aphasia and slurred speech early on 07/26/2019. EXAM: MRI HEAD WITHOUT CONTRAST TECHNIQUE: Multiplanar, multiecho pulse sequences of the brain and surrounding structures were obtained without intravenous contrast. COMPARISON:  Head CT 07/26/2019. FINDINGS: Brain: No restricted diffusion to suggest acute infarction. No midline shift, mass effect, evidence of mass lesion, ventriculomegaly, extra-axial collection or acute intracranial hemorrhage. Cervicomedullary junction and pituitary are within normal limits. Widely scattered cerebral white matter T2 and FLAIR hyperintensity in a nonspecific configuration, moderate for age. Patchy T2 and FLAIR hyperintensity in the central pons. No superimposed cortical encephalomalacia. No chronic cerebral blood products identified. Deep gray nuclei and cerebellum within normal limits for age. Vascular: Major intracranial vascular flow voids  are preserved. Skull and upper cervical spine: Negative visible cervical spine. Degenerative appearing sclerosis of the right mandible condyle. Other visualized bone marrow signal is within normal limits. Sinuses/Orbits: Postoperative changes to both globes, negative orbits. Paranasal sinuses and mastoids are stable and well pneumatized. Other: Visible internal auditory structures appear normal. Scalp and face soft tissues appear negative. IMPRESSION: 1. No acute intracranial abnormality. 2. Moderate for age nonspecific signal changes in the brain, most commonly due to chronic small vessel disease. Electronically Signed   By: Genevie Ann M.D.   On: 07/27/2019 00:40    Pertinent labs & imaging results that were available during my care of the patient were reviewed by me and considered in my medical decision making (see chart for details).  Medications Ordered in ED Medications  sodium chloride flush (NS) 0.9 % injection 3 mL (has no administration in time range)                                                                                                                                    Procedures Procedures  (including critical care time)  Medical Decision Making / ED Course I have reviewed the nursing notes for this encounter and the patient's prior records (if available in EHR or on provided paperwork).   Alex Frazier was evaluated in Emergency Department on 07/27/2019 for the symptoms described in the history of present illness. He was evaluated in the context of the global COVID-19 pandemic, which necessitated consideration that the patient might be at risk for infection with the SARS-CoV-2 virus that causes COVID-19. Institutional protocols and algorithms that pertain to the evaluation of patients at risk for COVID-19 are in a state of rapid change based on information released by regulatory bodies including the CDC and federal and state organizations. These policies and algorithms were  followed during the patient's care in the ED.  Patient presented with slurred speech in the setting of hypotension resolved with IV fluids. EKG nonischemic and w/o dysrhythmias. Screening labs grossly reassuring without electrolyte derangements or renal sufficiency.  No evidence of hemoconcentration on CBC but this was after IV fluids were given. While in waiting room, patient had CT scan and MRI which did not show ICH or CVA.  I believe his symptoms were related to the orthostatic hypotension and less likely due to TIA/CVA process.  Orthostatics here were reassuring.  Feel patient is appropriate for outpatient management.  He should be able to have a TIA work-up outpatient by PCP and neurology.      Final Clinical Impression(s) / ED Diagnoses Final diagnoses:  Hypotension, unspecified hypotension type  Aphasia     The patient appears reasonably screened and/or stabilized for discharge and I doubt any other medical condition or other [facility] requiring further screening, evaluation, or treatment in the ED at this time prior to discharge.  Disposition: Discharge  Condition: Good  I have discussed the results, Dx  and Tx plan with the patient and wife who expressed understanding and agree(s) with the plan. Discharge instructions discussed at great length. The patient and wife were given strict return precautions who verbalized understanding of the instructions. No further questions at time of discharge.    ED Discharge Orders    None        Follow Up: Susy Frizzle, MD [address] [place] [address] [phone]  Schedule an appointment as soon as possible for a visit  For close follow up to have TIA work up performed     This chart was dictated using voice recognition software.  Despite best efforts to proofread,  errors can occur which can change the documentation meaning.   Fatima Blank, MD 07/27/19 [phone]

## 2019-07-28 ENCOUNTER — Encounter (HOSPITAL_COMMUNITY): Payer: Medicare Other

## 2019-07-28 ENCOUNTER — Other Ambulatory Visit (HOSPITAL_COMMUNITY): Payer: Self-pay | Admitting: *Deleted

## 2019-07-28 DIAGNOSIS — R1310 Dysphagia, unspecified: Secondary | ICD-10-CM

## 2019-07-28 DIAGNOSIS — G20 Parkinson's disease: Secondary | ICD-10-CM

## 2019-07-29 ENCOUNTER — Telehealth: Payer: Self-pay | Admitting: Family Medicine

## 2019-07-29 MED ORDER — MIDODRINE HCL 10 MG PO TABS: 10.0000 mg | ORAL_TABLET | Freq: Three times a day (TID) | ORAL | 3 refills | Status: DC

## 2019-07-29 NOTE — Telephone Encounter (Signed)
We could increase midodrine to 10 mg per dose.  I would also recommend neurology consultation with his neurologist at [facility] to see if they have any other options

## 2019-07-29 NOTE — Telephone Encounter (Signed)
Pt was seen in ER for possible light stroke. MD there noted it was probably d/t his low BP. Pt's wife states that he is taking the medication to raise his BP tid and he has about passed out x 2 today and she wanted to know what to do next?

## 2019-07-29 NOTE — Telephone Encounter (Signed)
Wife aware - med sent to pharm and pt has apt with vascular specialists. She will wait to see what he says before making apt with [facility].

## 2019-08-01 ENCOUNTER — Other Ambulatory Visit: Payer: Self-pay | Admitting: Family Medicine

## 2019-08-09 ENCOUNTER — Encounter: Payer: Self-pay | Admitting: Family Medicine

## 2019-08-09 ENCOUNTER — Ambulatory Visit (HOSPITAL_COMMUNITY)
Admission: RE | Admit: 2019-08-09 | Discharge: 2019-08-09 | Disposition: A | Discharge status: Home / Self Care | Payer: Medicare Other | Source: Ambulatory Visit | Attending: Endocrinology | Admitting: Endocrinology

## 2019-08-09 ENCOUNTER — Other Ambulatory Visit: Payer: Self-pay

## 2019-08-09 DIAGNOSIS — G20 Parkinson's disease: Secondary | ICD-10-CM | POA: Insufficient documentation

## 2019-08-09 DIAGNOSIS — R1310 Dysphagia, unspecified: Secondary | ICD-10-CM | POA: Diagnosis not present

## 2019-08-09 MED ORDER — DULOXETINE HCL 60 MG PO CPEP: 60.0000 mg | ORAL_CAPSULE | Freq: Every day | ORAL | 3 refills | Status: DC

## 2019-08-11 ENCOUNTER — Encounter: Payer: Self-pay | Admitting: Family Medicine

## 2019-08-11 MED ORDER — DULOXETINE HCL 60 MG PO CPEP: 60.0000 mg | ORAL_CAPSULE | Freq: Every day | ORAL | 3 refills | Status: DC

## 2019-08-15 ENCOUNTER — Telehealth: Payer: Self-pay | Admitting: Family Medicine

## 2019-08-15 DIAGNOSIS — G20 Parkinson's disease: Secondary | ICD-10-CM

## 2019-08-15 NOTE — Telephone Encounter (Signed)
[place] with neurology consult.

## 2019-08-15 NOTE — Telephone Encounter (Signed)
They had to call out EMS. Pt refused to go as he had to wait 10 hours last time in the ER. Would like to know if we can refer him to a Neurologists? He has seen one in [place] but does not want to travel that far. OK to do referral?   Do you need to see the pt?

## 2019-08-15 NOTE — Telephone Encounter (Signed)
Referral placed and we do not need to see pt in office unless he needs to discuss something with Dr. Dennard Schaumann.

## 2019-08-15 NOTE — Telephone Encounter (Signed)
[place] physical therapist for Alex Frazier called and said that she went to do PT with patient and he was having TIA symptoms, slurred speech and confused. She did state that the EMS was called and they checked patient and recommended he go to ER patient refused.  CB# [phone]

## 2019-08-19 DIAGNOSIS — G909 Disorder of the autonomic nervous system, unspecified: Secondary | ICD-10-CM | POA: Diagnosis not present

## 2019-08-19 DIAGNOSIS — I959 Hypotension, unspecified: Secondary | ICD-10-CM | POA: Insufficient documentation

## 2019-08-19 DIAGNOSIS — R42 Dizziness and giddiness: Secondary | ICD-10-CM | POA: Diagnosis not present

## 2019-08-19 DIAGNOSIS — G20 Parkinson's disease: Secondary | ICD-10-CM | POA: Diagnosis not present

## 2019-08-19 DIAGNOSIS — G609 Hereditary and idiopathic neuropathy, unspecified: Secondary | ICD-10-CM | POA: Diagnosis not present

## 2019-08-19 DIAGNOSIS — I951 Orthostatic hypotension: Secondary | ICD-10-CM | POA: Diagnosis not present

## 2019-08-25 DIAGNOSIS — M5030 Other cervical disc degeneration, unspecified cervical region: Secondary | ICD-10-CM | POA: Diagnosis not present

## 2019-08-25 DIAGNOSIS — G629 Polyneuropathy, unspecified: Secondary | ICD-10-CM | POA: Diagnosis not present

## 2019-08-25 DIAGNOSIS — I951 Orthostatic hypotension: Secondary | ICD-10-CM | POA: Diagnosis not present

## 2019-08-25 DIAGNOSIS — G20 Parkinson's disease: Secondary | ICD-10-CM | POA: Diagnosis not present

## 2019-08-25 DIAGNOSIS — R1312 Dysphagia, oropharyngeal phase: Secondary | ICD-10-CM | POA: Diagnosis not present

## 2019-08-25 DIAGNOSIS — Z9181 History of falling: Secondary | ICD-10-CM | POA: Diagnosis not present

## 2019-08-25 DIAGNOSIS — R634 Abnormal weight loss: Secondary | ICD-10-CM | POA: Diagnosis not present

## 2019-08-26 DIAGNOSIS — G629 Polyneuropathy, unspecified: Secondary | ICD-10-CM | POA: Diagnosis not present

## 2019-08-26 DIAGNOSIS — R1312 Dysphagia, oropharyngeal phase: Secondary | ICD-10-CM | POA: Diagnosis not present

## 2019-08-26 DIAGNOSIS — G20 Parkinson's disease: Secondary | ICD-10-CM | POA: Diagnosis not present

## 2019-08-26 DIAGNOSIS — R634 Abnormal weight loss: Secondary | ICD-10-CM | POA: Diagnosis not present

## 2019-08-26 DIAGNOSIS — M5030 Other cervical disc degeneration, unspecified cervical region: Secondary | ICD-10-CM | POA: Diagnosis not present

## 2019-08-26 DIAGNOSIS — I951 Orthostatic hypotension: Secondary | ICD-10-CM | POA: Diagnosis not present

## 2019-08-31 DIAGNOSIS — R634 Abnormal weight loss: Secondary | ICD-10-CM | POA: Diagnosis not present

## 2019-08-31 DIAGNOSIS — G20 Parkinson's disease: Secondary | ICD-10-CM | POA: Diagnosis not present

## 2019-08-31 DIAGNOSIS — G629 Polyneuropathy, unspecified: Secondary | ICD-10-CM | POA: Diagnosis not present

## 2019-08-31 DIAGNOSIS — I951 Orthostatic hypotension: Secondary | ICD-10-CM | POA: Diagnosis not present

## 2019-08-31 DIAGNOSIS — M5030 Other cervical disc degeneration, unspecified cervical region: Secondary | ICD-10-CM | POA: Diagnosis not present

## 2019-08-31 DIAGNOSIS — R1312 Dysphagia, oropharyngeal phase: Secondary | ICD-10-CM | POA: Diagnosis not present

## 2019-09-06 DIAGNOSIS — R1312 Dysphagia, oropharyngeal phase: Secondary | ICD-10-CM | POA: Diagnosis not present

## 2019-09-06 DIAGNOSIS — G20 Parkinson's disease: Secondary | ICD-10-CM | POA: Diagnosis not present

## 2019-09-06 DIAGNOSIS — I951 Orthostatic hypotension: Secondary | ICD-10-CM | POA: Diagnosis not present

## 2019-09-06 DIAGNOSIS — M5030 Other cervical disc degeneration, unspecified cervical region: Secondary | ICD-10-CM | POA: Diagnosis not present

## 2019-09-06 DIAGNOSIS — G629 Polyneuropathy, unspecified: Secondary | ICD-10-CM | POA: Diagnosis not present

## 2019-09-06 DIAGNOSIS — R634 Abnormal weight loss: Secondary | ICD-10-CM | POA: Diagnosis not present

## 2019-09-08 DIAGNOSIS — G909 Disorder of the autonomic nervous system, unspecified: Secondary | ICD-10-CM | POA: Diagnosis not present

## 2019-09-08 DIAGNOSIS — I10 Essential (primary) hypertension: Secondary | ICD-10-CM | POA: Diagnosis not present

## 2019-09-08 DIAGNOSIS — Z79899 Other long term (current) drug therapy: Secondary | ICD-10-CM | POA: Diagnosis not present

## 2019-09-14 DIAGNOSIS — G629 Polyneuropathy, unspecified: Secondary | ICD-10-CM | POA: Diagnosis not present

## 2019-09-14 DIAGNOSIS — M5030 Other cervical disc degeneration, unspecified cervical region: Secondary | ICD-10-CM | POA: Diagnosis not present

## 2019-09-14 DIAGNOSIS — G20 Parkinson's disease: Secondary | ICD-10-CM | POA: Diagnosis not present

## 2019-09-14 DIAGNOSIS — I951 Orthostatic hypotension: Secondary | ICD-10-CM | POA: Diagnosis not present

## 2019-09-14 DIAGNOSIS — R1312 Dysphagia, oropharyngeal phase: Secondary | ICD-10-CM | POA: Diagnosis not present

## 2019-09-14 DIAGNOSIS — R634 Abnormal weight loss: Secondary | ICD-10-CM | POA: Diagnosis not present

## 2019-09-19 DIAGNOSIS — Z79899 Other long term (current) drug therapy: Secondary | ICD-10-CM | POA: Diagnosis not present

## 2019-09-19 DIAGNOSIS — G609 Hereditary and idiopathic neuropathy, unspecified: Secondary | ICD-10-CM | POA: Diagnosis not present

## 2019-09-19 DIAGNOSIS — G20 Parkinson's disease: Secondary | ICD-10-CM | POA: Diagnosis not present

## 2019-09-19 DIAGNOSIS — I951 Orthostatic hypotension: Secondary | ICD-10-CM | POA: Diagnosis not present

## 2019-09-19 DIAGNOSIS — I10 Essential (primary) hypertension: Secondary | ICD-10-CM | POA: Diagnosis not present

## 2019-09-19 DIAGNOSIS — G909 Disorder of the autonomic nervous system, unspecified: Secondary | ICD-10-CM | POA: Diagnosis not present

## 2019-09-21 DIAGNOSIS — M5030 Other cervical disc degeneration, unspecified cervical region: Secondary | ICD-10-CM | POA: Diagnosis not present

## 2019-09-21 DIAGNOSIS — G20 Parkinson's disease: Secondary | ICD-10-CM | POA: Diagnosis not present

## 2019-09-21 DIAGNOSIS — R634 Abnormal weight loss: Secondary | ICD-10-CM | POA: Diagnosis not present

## 2019-09-21 DIAGNOSIS — R1312 Dysphagia, oropharyngeal phase: Secondary | ICD-10-CM | POA: Diagnosis not present

## 2019-09-21 DIAGNOSIS — G629 Polyneuropathy, unspecified: Secondary | ICD-10-CM | POA: Diagnosis not present

## 2019-09-21 DIAGNOSIS — I951 Orthostatic hypotension: Secondary | ICD-10-CM | POA: Diagnosis not present

## 2019-09-30 ENCOUNTER — Other Ambulatory Visit: Payer: Self-pay

## 2019-09-30 ENCOUNTER — Encounter: Payer: Self-pay | Admitting: Diagnostic Neuroimaging

## 2019-09-30 ENCOUNTER — Ambulatory Visit (INDEPENDENT_AMBULATORY_CARE_PROVIDER_SITE_OTHER): Payer: Medicare Other | Admitting: Diagnostic Neuroimaging

## 2019-09-30 VITALS — BP 108/56 | HR 72 | Temp 97.3°F | Ht 66.0 in | Wt 143.0 lb

## 2019-09-30 DIAGNOSIS — G459 Transient cerebral ischemic attack, unspecified: Secondary | ICD-10-CM

## 2019-09-30 NOTE — Progress Notes (Signed)
[facility]  PATIENT: Alex Frazier DOB: 25-Jul-1946  REFERRING CLINICIAN: Susy Frizzle, MD HISTORY FROM: patient and wife  REASON FOR VISIT: new consult    HISTORICAL  CHIEF COMPLAINT:  Chief Complaint  Patient presents with  . New Patient (Initial Visit)    transfer of care for parkinson's disease & TIA work-up. The pt currently sees a parkinson's clinic. The pt's wife says he is going to have to get a new neurologist for his parkinsons however he is in a movements disorders clinic at [facility]. She says he is here today to discuss the TIA episodes he has had. His wife reports the pt lost his ability to make a sentence, part of them were not words. His BP that day was down to 70/44.   Marland Kitchen Referral    Susy Frizzle, MD  . Room 7    here with wife     HISTORY OF PRESENT ILLNESS:   74 year old male with Parkinson's disease, neurogenic orthostatic hypotension, neuropathy, here for evaluation of possible TIA versus hypertensive episode.  07/26/2019 patient was at home, when therapist came to help evaluate.  During evaluation patient was having transient slurred speech, confusion, aphasia.  Blood pressure was noted to be 70/44.  Patient went to the emergency room for evaluation.  He was diagnosed with hypotensive episode versus TIA.  CT and MRI that they were unremarkable.  Patient was recommended to follow-up with outpatient neurology for TIA work-up.  Patient had another episode on 08/15/2019 which was similar.  He has had 2 more episodes since that time.  Patient's wife called paramedics for one of the episodes but patient declined to go to the emergency room for evaluation due to concern about prolonged wait and Covid pandemic exposure.  Patient is managed for Parkinson's disease at [facility] movement disorder clinic.  Also managed by vascular clinic at [facility] for orthostatic hypotension, on midodrine and Florinef.  Has not tried droxidopa.  Patient  requested local neurology consult for TIA evaluation due to long driving distance.    REVIEW OF SYSTEMS: Full 14 system review of systems performed and negative with exception of: As per HPI.  ALLERGIES: Allergies  Allergen Reactions  . Indomethacin   . Lyrica [Pregabalin]     HOME MEDICATIONS: Outpatient Medications Prior to Visit  Medication Sig Dispense Refill  . b complex vitamins tablet Take 1,000 tablets by mouth daily.     . carbidopa-levodopa (SINEMET IR) 25-100 MG tablet 2.5 tablets 3 (three) times daily.     . Carbidopa-Levodopa ER (SINEMET CR) 25-100 MG tablet controlled release Take 1 tablet by mouth at bedtime.  2  . donepezil (ARICEPT) 5 MG tablet TK 1 T PO  NIGHTLY    . DULoxetine (CYMBALTA) 60 MG capsule Take 1 capsule (60 mg total) by mouth daily. 90 capsule 3  . Fish Oil-Cholecalciferol (OMEGA-3 FISH OIL/VITAMIN D3) 1000-1000 MG-UNIT CAPS Take by mouth.    . fludrocortisone (FLORINEF) 0.1 MG tablet TAKE 2 TABLETS BY MOUTH DAILY. GENERIC EQUIVALENT FOR FLORINEF. (Patient taking differently: 0.2 mg daily. ) 180 tablet 0  . fluticasone (FLONASE) 50 MCG/ACT nasal spray Place 2 sprays into both nostrils daily. 16 g 6  . midodrine (PROAMATINE) 10 MG tablet Take 1 tablet (10 mg total) by mouth 3 (three) times daily. 270 tablet 3  . Polyethylene Glycol 3350 (MIRALAX PO) Take by mouth.    . pravastatin (PRAVACHOL) 20 MG tablet TAKE 1 TABLET BY MOUTH DAILY AT 6  PM GENERIC EQUIVALENT FOR PRAVACHOL 90 tablet 1  . midodrine (PROAMATINE) 5 MG tablet Take 1 tablet (5 mg total) by mouth 3 (three) times daily with meals. (Patient taking differently: Take by mouth. As directed up to 10 mg. 7 AM, 12 PM, 5 PM) 90 tablet 1  . predniSONE (DELTASONE) 20 MG tablet TAKE 3 TABLETS BY MOUTH EVERY DAY FOR 1 TO 2 DAYS, 2 TABLETS BY MOUTH EVERY DAY FOR DAYS 3 TO 4, 1 TABLET BY MOUTH EVERY DAY FOR DAYS 5 TO 6. (Patient not taking: Reported on 09/30/2019) 12 tablet 0   No facility-administered  medications prior to visit.    PAST MEDICAL HISTORY: Past Medical History:  Diagnosis Date  . B12 deficiency    Borderline  . Bulging discs   . Gastritis   . Hyperlipidemia   . Neuropathy   . Parkinson's disease ([place])   . Parkinsonian syndrome ([place])   . Peripheral neuropathy   . TIA (transient ischemic attack)    TIA symptoms from hypotension    PAST SURGICAL HISTORY: Past Surgical History:  Procedure Laterality Date  . APPENDECTOMY    . BACK SURGERY      FAMILY HISTORY: Family History  Problem Relation Age of Onset  . Parkinson's disease Mother   . Parkinson's disease Father   . Diabetes Sister   . Heart disease Brother     SOCIAL HISTORY: Social History   Socioeconomic History  . Marital status: Married    Spouse name: Not on file  . Number of children: 3  . Years of education: Not on file  . Highest education level: High school graduate  Occupational History  . Not on file  Tobacco Use  . Smoking status: Never Smoker  . Smokeless tobacco: Never Used  Substance and Sexual Activity  . Alcohol use: No  . Drug use: No  . Sexual activity: Not on file  Other Topics Concern  . Not on file  Social History Narrative   Lives at home with his wife   Right handed   Caffeine: none   Social Determinants of Health   Financial Resource Strain:   . Difficulty of Paying Living Expenses: Not on file  Food Insecurity:   . Worried About Charity fundraiser in the Last Year: Not on file  . Ran Out of Food in the Last Year: Not on file  Transportation Needs:   . Lack of Transportation (Medical): Not on file  . Lack of Transportation (Non-Medical): Not on file  Physical Activity:   . Days of Exercise per Week: Not on file  . Minutes of Exercise per Session: Not on file  Stress:   . Feeling of Stress : Not on file  Social Connections:   . Frequency of Communication with Friends and Family: Not on file  . Frequency of Social Gatherings with Friends and Family:  Not on file  . Attends Religious Services: Not on file  . Active Member of Clubs or Organizations: Not on file  . Attends Archivist Meetings: Not on file  . Marital Status: Not on file  Intimate Partner Violence:   . Fear of Current or Ex-Partner: Not on file  . Emotionally Abused: Not on file  . Physically Abused: Not on file  . Sexually Abused: Not on file     PHYSICAL EXAM  GENERAL EXAM/CONSTITUTIONAL: Vitals:  Vitals:   09/30/19 1140  BP: (!) 108/56  Pulse: 72  Temp: (!) 97.3 F (36.3 C)  Weight: 143 lb (64.9 kg)  Height: 5\' 6"  (1.676 m)     Body mass index is 23.08 kg/m. Wt Readings from Last 3 Encounters:  09/30/19 143 lb (64.9 kg)  04/25/19 149 lb (67.6 kg)  06/09/18 159 lb (72.1 kg)     Patient is in no distress; well developed, nourished and groomed; neck is supple  CARDIOVASCULAR:  Examination of carotid arteries is normal; no carotid bruits  Regular rate and rhythm, no murmurs  Examination of peripheral vascular system by observation and palpation is normal  EYES:  Ophthalmoscopic exam of optic discs and posterior segments is normal; no papilledema or hemorrhages  No exam data present  MUSCULOSKELETAL:  Gait, strength, tone, movements noted in Neurologic exam below  NEUROLOGIC: MENTAL STATUS:  No flowsheet data found.  awake, alert, oriented to person, place and time  recent and remote memory intact  normal attention and concentration  language fluent, comprehension intact, naming intact  fund of knowledge appropriate  CRANIAL NERVE:   2nd - no papilledema on fundoscopic exam  2nd, 3rd, 4th, 6th - pupils equal and reactive to light, visual fields full to confrontation, extraocular muscles intact, no nystagmus  5th - facial sensation symmetric  7th - facial strength symmetric  8th - hearing intact  9th - palate elevates symmetrically, uvula midline  11th - shoulder shrug symmetric  12th - tongue protrusion  midline  QUIET VOICE; HYPOMIMIA  MOTOR:   RARE RESTING TREMOR IN LUE  COGWHEELING IN LUE > RUE  BRADYKINESIA IN LUE > RUE  BRADYKINESIA IN LLE > RLE  DIFFUSE 4/5 STRENGTH  SENSORY:   normal and symmetric to light touch; DECR VIB IN FEET  COORDINATION:   finger-nose-finger, fine finger movements SLOW  REFLEXES:   deep tendon reflexes TRACE and symmetric  GAIT/STATION:   IN WHEEL CHAIR; WEAK APPEARING     DIAGNOSTIC DATA (LABS, IMAGING, TESTING) - I reviewed patient records, labs, notes, testing and imaging myself where available.  Lab Results  Component Value Date   WBC 5.5 07/26/2019   HGB 14.3 07/26/2019   HCT 42.0 07/26/2019   MCV 91.0 07/26/2019   PLT 248 07/26/2019      Component Value Date/Time   NA 139 07/26/2019 1657   K 4.1 07/26/2019 1657   CL 102 07/26/2019 1657   CO2 25 07/26/2019 1645   GLUCOSE 87 07/26/2019 1657   BUN 15 07/26/2019 1657   CREATININE 0.70 07/26/2019 1657   CREATININE 0.79 05/31/2019 1618   CALCIUM 9.2 07/26/2019 1645   PROT 6.2 (L) 07/26/2019 1645   ALBUMIN 4.0 07/26/2019 1645   AST 22 07/26/2019 1645   ALT 6 07/26/2019 1645   ALKPHOS 110 07/26/2019 1645   BILITOT 0.4 07/26/2019 1645   GFRNONAA >60 07/26/2019 1645   GFRNONAA 90 05/31/2019 1618   GFRAA >60 07/26/2019 1645   GFRAA 104 05/31/2019 1618   Lab Results  Component Value Date   CHOL 124 04/25/2019   HDL 41 04/25/2019   LDLCALC 67 04/25/2019   TRIG 76 04/25/2019   CHOLHDL 3.0 04/25/2019   No results found for: HGBA1C Lab Results  Component Value Date   [id number] 03/22/2018   Lab Results  Component Value Date   TSH 1.43 08/05/2016    07/26/19 MRI BRAIN [I reviewed images myself and agree with interpretation. -VRP]  1. No acute intracranial abnormality. 2. Moderate for age nonspecific signal changes in the brain, most commonly due to chronic small vessel disease.   ASSESSMENT  AND PLAN  74 y.o. year old male here with Parkinson's disease  with neurogenic orthostatic hypotension, with ongoing difficulty in hypotensive attacks associated with slurred speech confusion and aphasia.  Will complete TIA work-up with carotid ultrasound and echocardiogram.  Attacks more likely related to low blood pressure events.  Dx:  1. TIA (transient ischemic attack)     PLAN:  PARKINSON'S DISEASE (with neurogenic orthostatic hypotension) - transient weakness, speech diff, confusion, in setting of low BP - continue carbidopa levodopa, midodrine, Florinef - check carotid u/s, echocardiogram  Orders Placed This Encounter  Procedures  . ECHOCARDIOGRAM COMPLETE  . VAS US CAROTID   Return for pending if symptoms worsen or fail to improve.    Penni Bombard, MD [id number], [id number] PM Certified in Neurology, Neurophysiology and Neuroimaging  [facility] [address], [place], [place] [address] [phone]

## 2019-09-30 NOTE — Patient Instructions (Signed)
PARKINSON'S DISEASE (with neurogenic orthostatic hypotension) - transient weakness, speech diff, confusion, in setting of low BP - check carotid u/s, echocardiogram

## 2019-10-04 ENCOUNTER — Other Ambulatory Visit: Payer: Self-pay

## 2019-10-04 ENCOUNTER — Ambulatory Visit (HOSPITAL_COMMUNITY)
Admission: RE | Admit: 2019-10-04 | Discharge: 2019-10-04 | Disposition: A | Discharge status: Home / Self Care | Payer: Medicare Other | Source: Ambulatory Visit | Attending: Cardiovascular Disease | Admitting: Cardiovascular Disease

## 2019-10-04 DIAGNOSIS — G459 Transient cerebral ischemic attack, unspecified: Secondary | ICD-10-CM | POA: Diagnosis not present

## 2019-10-06 ENCOUNTER — Ambulatory Visit: Payer: Medicare Other

## 2019-10-10 ENCOUNTER — Telehealth: Payer: Self-pay | Admitting: *Deleted

## 2019-10-10 NOTE — Telephone Encounter (Signed)
Called patient, spoke with wife, Alex Frazier on [place] and informed her the carotid US test was an unremarkable study with no major findings.  Dr Leta Baptist advises to continue current plan. His ECHO is on Wed; I advised she'll get a call when those results are available. She  verbalized understanding, appreciation.

## 2019-10-12 ENCOUNTER — Other Ambulatory Visit (HOSPITAL_COMMUNITY): Payer: Medicare Other

## 2019-10-14 ENCOUNTER — Ambulatory Visit: Payer: Medicare Other

## 2019-10-15 ENCOUNTER — Ambulatory Visit: Payer: Medicare Other | Attending: Internal Medicine

## 2019-10-15 DIAGNOSIS — Z23 Encounter for immunization: Secondary | ICD-10-CM | POA: Insufficient documentation

## 2019-10-15 NOTE — Progress Notes (Signed)
   Covid-19 Vaccination Clinic  Name:  Alex Frazier    MRN: [id number] DOB: Mar 30, 1946  10/15/2019  [name] was observed post Covid-19 immunization for 15 minutes without incidence. He was provided with Vaccine Information Sheet and instruction to access the V-Safe system.   [name] was instructed to call 911 with any severe reactions post vaccine: Marland Kitchen Difficulty breathing  . Swelling of your face and throat  . A fast heartbeat  . A bad rash all over your body  . Dizziness and weakness    Immunizations Administered    Name Date Dose VIS Date Route   Pfizer COVID-19 Vaccine 10/15/2019  5:05 PM 0.3 mL 08/19/2019 Intramuscular   Manufacturer: [place]   Lot: [id number]   [place]: [id number]

## 2019-10-16 ENCOUNTER — Emergency Department (HOSPITAL_COMMUNITY): Payer: Medicare Other

## 2019-10-16 ENCOUNTER — Other Ambulatory Visit: Payer: Self-pay

## 2019-10-16 ENCOUNTER — Encounter (HOSPITAL_COMMUNITY): Payer: Self-pay

## 2019-10-16 ENCOUNTER — Emergency Department (HOSPITAL_COMMUNITY)
Admission: EM | Admit: 2019-10-16 | Discharge: 2019-10-16 | Disposition: A | Discharge status: Home / Self Care | Payer: Medicare Other | Attending: Emergency Medicine | Admitting: Emergency Medicine

## 2019-10-16 DIAGNOSIS — M5030 Other cervical disc degeneration, unspecified cervical region: Secondary | ICD-10-CM | POA: Diagnosis not present

## 2019-10-16 DIAGNOSIS — Y999 Unspecified external cause status: Secondary | ICD-10-CM | POA: Insufficient documentation

## 2019-10-16 DIAGNOSIS — Y9389 Activity, other specified: Secondary | ICD-10-CM | POA: Diagnosis not present

## 2019-10-16 DIAGNOSIS — Z79899 Other long term (current) drug therapy: Secondary | ICD-10-CM | POA: Diagnosis not present

## 2019-10-16 DIAGNOSIS — S32010A Wedge compression fracture of first lumbar vertebra, initial encounter for closed fracture: Secondary | ICD-10-CM | POA: Diagnosis not present

## 2019-10-16 DIAGNOSIS — W19XXXA Unspecified fall, initial encounter: Secondary | ICD-10-CM | POA: Insufficient documentation

## 2019-10-16 DIAGNOSIS — S32019A Unspecified fracture of first lumbar vertebra, initial encounter for closed fracture: Secondary | ICD-10-CM | POA: Diagnosis not present

## 2019-10-16 DIAGNOSIS — Y9289 Other specified places as the place of occurrence of the external cause: Secondary | ICD-10-CM | POA: Insufficient documentation

## 2019-10-16 DIAGNOSIS — M545 Low back pain: Secondary | ICD-10-CM | POA: Diagnosis not present

## 2019-10-16 DIAGNOSIS — G9009 Other idiopathic peripheral autonomic neuropathy: Secondary | ICD-10-CM | POA: Diagnosis not present

## 2019-10-16 DIAGNOSIS — G20 Parkinson's disease: Secondary | ICD-10-CM | POA: Diagnosis not present

## 2019-10-16 DIAGNOSIS — S3992XA Unspecified injury of lower back, initial encounter: Secondary | ICD-10-CM | POA: Diagnosis present

## 2019-10-16 DIAGNOSIS — M546 Pain in thoracic spine: Secondary | ICD-10-CM | POA: Diagnosis not present

## 2019-10-16 HISTORY — DX: Other cervical disc degeneration, unspecified cervical region: M50.30

## 2019-10-16 MED ORDER — HYDROCODONE-ACETAMINOPHEN 5-325 MG PO TABS
1.0000 | ORAL_TABLET | Freq: Once | ORAL | Status: AC
  Administered 2019-10-16: 1 via ORAL
  Filled 2019-10-16: qty 1

## 2019-10-16 MED ORDER — HYDROCODONE-ACETAMINOPHEN 5-325 MG PO TABS: 1.0000 | ORAL_TABLET | ORAL | 0 refills | Status: DC | PRN

## 2019-10-16 NOTE — ED Provider Notes (Signed)
Medical screening examination/treatment/procedure(s) were conducted as a shared visit with non-physician practitioner(s) and myself.  I personally evaluated the patient during the encounter.      Patient seen by me along with physician assistant.  Patient with a fall on Tuesday fell from a walking position.  Since that time has had pain in his back area.  Patient has a history of Parkinson's.  Normally gets around with a walker but wife states that his balance is not very good she is usually right there with him but this time he ventured out on his own.  Patient does not walk very much anymore.  Is much more sedentary.  X-rays of the thoracic lumbar spine show evidence of a moderate compression deformity of L1 vertebrae says indeterminate age.  But patient's had no pain in the past except for following this fall.  Offered CT the lumbar area for more detailed look at this and since is difficult to get him around his wife wanted to go ahead and do the CT scan today.  Patient without any significant lower extremity neuro deficits or anything concerning for cauda equina syndrome.  Patient will get the CT lumbar follow-up with orthopedics they are willing to follow-up with Dr. Aline Brochure here in [place].  And will probably need hydrocodone for pain.  Patient's wife has been giving him Motrin for the pain and has not helped very much.   Fredia Sorrow, MD 10/16/19 1353

## 2019-10-16 NOTE — ED Provider Notes (Signed)
[facility] Provider Note   CSN: [id number] Arrival date & time: 10/16/19  1205     History Chief Complaint  Patient presents with  . [place] is a 74 y.o. male.  The history is provided by the patient. No language interpreter was used.  Fall This is a new problem. Episode onset: 5 days. The problem occurs constantly. The problem has been gradually worsening. Nothing aggravates the symptoms. Nothing relieves the symptoms. He has tried nothing for the symptoms. The treatment provided no relief.   Pt reports he fell on Tuesday .  Pt complains of pain in upper and lower back.  Pt has a history of Parkinson and degenerative back disease.  Pt has had increased difficulty walking due to pain.  Pt denies hitting his head. No chest or abdominal pain.      Past Medical History:  Diagnosis Date  . B12 deficiency    Borderline  . Bulging discs   . Degenerative disc disease, cervical   . Gastritis   . Hyperlipidemia   . Neuropathy   . Parkinson's disease ([place])   . Parkinsonian syndrome ([place])   . Peripheral neuropathy   . TIA (transient ischemic attack)    TIA symptoms from hypotension    Patient Active Problem List   Diagnosis Date Noted  . DDD (degenerative disc disease), cervical 04/07/2016  . Neuropathy   . Bulging discs   . Gastritis   . Parkinsonian syndrome ([place])   . Hyperlipidemia     Past Surgical History:  Procedure Laterality Date  . APPENDECTOMY    . BACK SURGERY         Family History  Problem Relation Age of Onset  . Parkinson's disease Mother   . Parkinson's disease Father   . Diabetes Sister   . Heart disease Brother     Social History   Tobacco Use  . Smoking status: Never Smoker  . Smokeless tobacco: Never Used  Substance Use Topics  . Alcohol use: No  . Drug use: No    Home Medications Prior to Admission medications   Medication Sig Start Date End Date Taking? Authorizing Provider  b complex vitamins  tablet Take 1,000 tablets by mouth daily.     [provider]  carbidopa-levodopa (SINEMET IR) 25-100 MG tablet 2.5 tablets 3 (three) times daily.  09/07/15   [provider]  Carbidopa-Levodopa ER (SINEMET CR) 25-100 MG tablet controlled release Take 1 tablet by mouth at bedtime. 07/07/15   [provider]  donepezil (ARICEPT) 5 MG tablet TK 1 T PO  NIGHTLY 04/19/19   [provider]  DULoxetine (CYMBALTA) 60 MG capsule Take 1 capsule (60 mg total) by mouth daily. 08/11/19   Susy Frizzle, MD  Fish Oil-Cholecalciferol (OMEGA-3 FISH OIL/VITAMIN D3) 1000-1000 MG-UNIT CAPS Take by mouth.    [provider]  fludrocortisone (FLORINEF) 0.1 MG tablet TAKE 2 TABLETS BY MOUTH DAILY. GENERIC EQUIVALENT FOR FLORINEF. Patient taking differently: 0.2 mg daily.  08/01/19   Susy Frizzle, MD  fluticasone (FLONASE) 50 MCG/ACT nasal spray Place 2 sprays into both nostrils daily. 01/09/14   Susy Frizzle, MD  midodrine (PROAMATINE) 10 MG tablet Take 1 tablet (10 mg total) by mouth 3 (three) times daily. 07/29/19   Susy Frizzle, MD  Polyethylene Glycol 3350 (MIRALAX PO) Take by mouth.    [provider]  pravastatin (PRAVACHOL) 20 MG tablet TAKE 1 TABLET BY MOUTH DAILY  AT 6 PM GENERIC EQUIVALENT FOR PRAVACHOL 10/27/18   Susy Frizzle, MD    Allergies    Indomethacin and Lyrica [pregabalin]  Review of Systems   Review of Systems  All other systems reviewed and are negative.   Physical Exam Updated Vital Signs BP (!) 144/60 (BP Location: Left Arm)   Pulse 77   Temp (!) 97.4 F (36.3 C) (Tympanic)   Resp 18   Ht 5\' 8"  (1.727 m)   Wt 65.3 kg   SpO2 100%   BMI 21.90 kg/m   Physical Exam Vitals and nursing note reviewed.  Constitutional:      Appearance: He is well-developed.  HENT:     Head: Normocephalic and atraumatic.  Eyes:     Conjunctiva/sclera: Conjunctivae normal.  Cardiovascular:     Rate and Rhythm: Normal rate and  regular rhythm.     Heart sounds: No murmur.  Pulmonary:     Effort: Pulmonary effort is normal. No respiratory distress.     Breath sounds: Normal breath sounds.  Abdominal:     Palpations: Abdomen is soft.     Tenderness: There is no abdominal tenderness.  Musculoskeletal:        General: Tenderness present.     Cervical back: Neck supple.     Comments: Tender mid lumbar spine, tender thoracic spine between scapula, c spine nontender   Skin:    General: Skin is warm and dry.  Neurological:     General: No focal deficit present.     Mental Status: He is alert.  Psychiatric:        Mood and Affect: Mood normal.     ED Results / Procedures / Treatments   Labs (all labs ordered are listed, but only abnormal results are displayed) Labs Reviewed - No data to display  EKG None  Radiology DG Thoracic Spine 2 View  Result Date: 10/16/2019 CLINICAL DATA:  Upper back pain after fall last week. EXAM: THORACIC SPINE 2 VIEWS COMPARISON:  None. FINDINGS: There is no evidence of acute thoracic spine fracture. Moderate compression deformity L1 vertebral body is noted. Alignment is normal. No other significant bone abnormalities are identified. IMPRESSION: Moderate compression deformity of L1 vertebral body is noted concerning for fracture of indeterminate age. No significant abnormality seen in thoracic spine. Electronically Signed   By: Marijo Conception M.D.   On: 10/16/2019 13:30   DG Lumbar Spine Complete  Result Date: 10/16/2019 CLINICAL DATA:  Lower back pain after fall last week. EXAM: LUMBAR SPINE - COMPLETE 4+ VIEW COMPARISON:  None. FINDINGS: Moderate compression deformity of L1 vertebral body is noted consistent with fracture of indeterminate age. No significant spondylolisthesis is noted. Disc spaces are well maintained. IMPRESSION: Moderate compression deformity of L1 vertebral body consistent with fracture of indeterminate age. MRI may be performed for further evaluation.  Electronically Signed   By: Marijo Conception M.D.   On: 10/16/2019 13:33    Procedures Procedures (including critical care time)  Medications Ordered in ED Medications - No data to display  ED Course  I have reviewed the triage vital signs and the nursing notes.  Pertinent labs & imaging results that were available during my care of the patient were reviewed by me and considered in my medical decision making (see chart for details).    MDM Rules/Calculators/A&P                      MDM: LS spine shows L1  compression fracture.  Ct scan shows height loss. No cord compression.  Pt given hydrocodone here. Dr. Rogene Houston in to see and examine.  Pt advised to follow up with Dr. Aline Brochure  for evaluation.  Final Clinical Impression(s) / ED Diagnoses Final diagnoses:  Closed compression fracture of body of L1 vertebra ([place])    Rx / DC Orders ED Discharge Orders         Ordered    HYDROcodone-acetaminophen (NORCO/VICODIN) 5-325 MG tablet  Every 4 hours PRN     10/16/19 1453        An After Visit Summary was printed and given to the patient.    Fransico Meadow, [place] 10/16/19 1453    Fredia Sorrow, MD 10/17/19 1556

## 2019-10-16 NOTE — ED Triage Notes (Signed)
Pt presents to ED after complaints of fall on Tuesday. Pt states he lost his balance and fell, landing on his back and hit his head on the floor. Pt denies LOC.

## 2019-10-17 ENCOUNTER — Ambulatory Visit: Payer: Medicare Other

## 2019-10-17 ENCOUNTER — Encounter: Payer: Self-pay | Admitting: Family Medicine

## 2019-10-20 ENCOUNTER — Other Ambulatory Visit (HOSPITAL_COMMUNITY): Payer: Medicare Other

## 2019-10-20 ENCOUNTER — Telehealth: Payer: Self-pay | Admitting: *Deleted

## 2019-10-20 NOTE — Telephone Encounter (Signed)
Received call from patient caregiver.   Reports that since ER visit when compression Fx noted, patient has been having increased jerking and tremors. Reports that she stopped Hydrocodone as pharmacist stated that it may worsen Parkinson's Sx in patient.   Also reports that he is confused and having hallucinations.   Appointment scheduled.

## 2019-10-21 ENCOUNTER — Ambulatory Visit (INDEPENDENT_AMBULATORY_CARE_PROVIDER_SITE_OTHER): Payer: Medicare Other | Admitting: Family Medicine

## 2019-10-21 DIAGNOSIS — R443 Hallucinations, unspecified: Secondary | ICD-10-CM

## 2019-10-21 DIAGNOSIS — R410 Disorientation, unspecified: Secondary | ICD-10-CM

## 2019-10-21 DIAGNOSIS — G20 Parkinson's disease: Secondary | ICD-10-CM

## 2019-10-21 MED ORDER — QUETIAPINE FUMARATE 25 MG PO TABS: 25.0000 mg | ORAL_TABLET | Freq: Every day | ORAL | 1 refills | Status: DC

## 2019-10-21 MED ORDER — SULFAMETHOXAZOLE-TRIMETHOPRIM 800-160 MG PO TABS: 1.0000 | ORAL_TABLET | Freq: Two times a day (BID) | ORAL | 0 refills | Status: DC

## 2019-10-21 NOTE — Progress Notes (Signed)
Subjective:    Patient ID: Alex Frazier, male    DOB: 1945/12/31, 74 y.o.   MRN: [id number]  HPI  Patient is being seen today as a telephone visit.  Phone call began at 1102.  Phone call concluded at 1120.  His wife provides the history due to his dementia and Parkinson's disease.  She consents to conduct the interview via telephone.  Patient was originally scheduled to be seen in the office at 1230 however it is raining heavily and the patient and his wife has a difficult time leaving the home as he is essentially confined to a wheelchair and she needs assistance moving him from the home.  Therefore I called him to try to make it easier on her.  The patient fell on February 2.  He suffered a vertebral fracture at L1.  This was seen on an x-ray and in the emergency room on February 7.  The patient has been taking hydrocodone for pain due to the vertebral fracture.  However starting Monday he was experiencing more significant hallucinations.  These included him seeing cars in his home.  His wife states that he is talking to people who are not there.  He is also experiencing myoclonic jerks in either extremity as well as in his legs.  These are brief short jerking movements that stop on their own.  They do not sound like seizures as they are not repetitive.  Therefore the wife discontinued the hydrocodone on Tuesday morning after only 1 day of pain medication.  He has been off of pain medication since Tuesday however even today the patient is still experiencing hallucinations.  He is very weak.  He is unable to walk due to weakness in his legs.  His legs were trembling.  He is also increasingly delirious.  For instance he usually wears a depends at night.  His wife states that Thursday night he woke up and pulled his pants down urinated in the bed.  He has done that on 2 separate occasions.  He continues to talk to people who are not there and see hallucinations.  Patient previously saw hallucinations when  he was taking Mirapex.  In the past we have had to discontinue Mirapex and also reduce his dose of Sinemet due to hallucinations.  Wife does not feel that the patient is dehydrated.  She states that he is drinking well.  We discussed signs and symptoms of a stroke.  She denies any unilateral weakness, facial droop, slurred speech.  She states that the patient is alert and awake and even agitated at times.  He is not lethargic or sleepy.  She denies any head trauma. Past Medical History:  Diagnosis Date  . B12 deficiency    Borderline  . Bulging discs   . Degenerative disc disease, cervical   . Gastritis   . Hyperlipidemia   . Neuropathy   . Parkinson's disease ([place])   . Parkinsonian syndrome ([place])   . Peripheral neuropathy   . TIA (transient ischemic attack)    TIA symptoms from hypotension   Past Surgical History:  Procedure Laterality Date  . APPENDECTOMY    . BACK SURGERY     Current Outpatient Medications on File Prior to Visit  Medication Sig Dispense Refill  . carbidopa-levodopa (SINEMET IR) 25-100 MG tablet Take 2.5 tablets by mouth 3 (three) times daily.     . Carbidopa-Levodopa ER (SINEMET CR) 25-100 MG tablet controlled release Take 1 tablet by mouth at bedtime.  2  . cholecalciferol (VITAMIN D3) 25 MCG (1000 UNIT) tablet Take 2,000 Units by mouth daily.    . Cyanocobalamin (VITAMIN B 12 PO) Take 1 tablet by mouth daily.    Marland Kitchen donepezil (ARICEPT) 5 MG tablet Take 5 mg by mouth at bedtime.     . DULoxetine (CYMBALTA) 60 MG capsule Take 1 capsule (60 mg total) by mouth daily. 90 capsule 3  . fludrocortisone (FLORINEF) 0.1 MG tablet TAKE 2 TABLETS BY MOUTH DAILY. GENERIC EQUIVALENT FOR FLORINEF. (Patient taking differently: Take 0.1 mg by mouth daily. ) 180 tablet 0  . fluticasone (FLONASE) 50 MCG/ACT nasal spray Place 2 sprays into both nostrils daily. (Patient taking differently: Place 2 sprays into both nostrils daily as needed. ) 16 g 6  . HYDROcodone-acetaminophen  (NORCO/VICODIN) 5-325 MG tablet Take 1 tablet by mouth every 4 (four) hours as needed. 16 tablet 0  . ibuprofen (ADVIL) 200 MG tablet Take 200-400 mg by mouth every 6 (six) hours as needed for mild pain or moderate pain.    . midodrine (PROAMATINE) 10 MG tablet Take 1 tablet (10 mg total) by mouth 3 (three) times daily. (Patient taking differently: Take 0-10 mg by mouth 3 (three) times daily as needed. Patient gets amount based off blood pressure readings) 270 tablet 3  . naproxen sodium (ALEVE) 220 MG tablet Take 220 mg by mouth daily as needed.    . Polyethylene Glycol 3350 (MIRALAX PO) Take 17 g by mouth daily.     . pravastatin (PRAVACHOL) 20 MG tablet TAKE 1 TABLET BY MOUTH DAILY AT 6 PM GENERIC EQUIVALENT FOR PRAVACHOL (Patient taking differently: Take 20 mg by mouth daily. TAKE 1 TABLET BY MOUTH DAILY AT 6 PM GENERIC EQUIVALENT FOR PRAVACHOL) 90 tablet 1   No current facility-administered medications on file prior to visit.   Allergies  Allergen Reactions  . Indomethacin   . Lyrica [Pregabalin]    Social History   Socioeconomic History  . Marital status: Married    Spouse name: Not on file  . Number of children: 3  . Years of education: Not on file  . Highest education level: High school graduate  Occupational History  . Not on file  Tobacco Use  . Smoking status: Never Smoker  . Smokeless tobacco: Never Used  Substance and Sexual Activity  . Alcohol use: No  . Drug use: No  . Sexual activity: Not on file  Other Topics Concern  . Not on file  Social History Narrative   Lives at home with his wife   Right handed   Caffeine: none   Social Determinants of Health   Financial Resource Strain:   . Difficulty of Paying Living Expenses: Not on file  Food Insecurity:   . Worried About Charity fundraiser in the Last Year: Not on file  . Ran Out of Food in the Last Year: Not on file  Transportation Needs:   . Lack of Transportation (Medical): Not on file  . Lack of  Transportation (Non-Medical): Not on file  Physical Activity:   . Days of Exercise per Week: Not on file  . Minutes of Exercise per Session: Not on file  Stress:   . Feeling of Stress : Not on file  Social Connections:   . Frequency of Communication with Friends and Family: Not on file  . Frequency of Social Gatherings with Friends and Family: Not on file  . Attends Religious Services: Not on file  . Active Member of  Clubs or Organizations: Not on file  . Attends Archivist Meetings: Not on file  . Marital Status: Not on file  Intimate Partner Violence:   . Fear of Current or Ex-Partner: Not on file  . Emotionally Abused: Not on file  . Physically Abused: Not on file  . Sexually Abused: Not on file     Review of Systems  All other systems reviewed and are negative.      Objective:   Physical Exam        Assessment & Plan:  Delirium  Parkinson disease ([place])  Hallucination  This is difficult to determine without seeing the patient.  Wife does not want to take him to the emergency room.  At the present time differential diagnosis includes hallucinations due to Parkinson's disease (he has had these in the past including the delusion of parasitosis), dehydration, infection such as pneumonia or urinary tract infection, CVA, or dementia.  Based on the wife's description and my questions I do not believe he has had a CVA.  There are no symptoms of pneumonia.  She denies any cough or shortness of breath.  Therefore I believe this is more likely multifactorial.  The patient very well could have a urinary tract infection, he may be dehydrated, it may be secondary to medication such as the narcotics he is been taking for his vertebral fracture, hallucinations related to Parkinson's disease.  Therefore I will try to treat his many of these as possible and then reassess the patient on Monday.  I will start him empirically on Bactrim double strength tablets twice daily for possible  UTI.  I asked the patient to bring a urine sample by without having to bring the patient in due to the difficulty they have leaving the home.  She will try to do this.  I have encouraged her to push fluids as he may be dehydrated.  I agree that she needs to keep them away from narcotic pain medication.  We will start Seroquel 25 mg p.o. nightly for possible hallucinations related to Parkinson's disease.  Recheck the patient on Monday or immediately go to the emergency room if worsening.

## 2019-10-24 ENCOUNTER — Telehealth: Payer: Self-pay | Admitting: Family Medicine

## 2019-10-24 NOTE — Telephone Encounter (Signed)
Called [employer] [place] for a prior authorization of the Seroquel. Spoke to [place] and information given and she submitted the claim and should hear back sometime today with determination.   [employer] - [phone]

## 2019-10-25 ENCOUNTER — Other Ambulatory Visit: Payer: Self-pay | Admitting: Family Medicine

## 2019-10-25 DIAGNOSIS — E785 Hyperlipidemia, unspecified: Secondary | ICD-10-CM

## 2019-10-25 MED ORDER — PRAVASTATIN SODIUM 20 MG PO TABS: ORAL_TABLET | ORAL | 2 refills | Status: DC

## 2019-10-25 MED ORDER — QUETIAPINE FUMARATE 25 MG PO TABS: 25.0000 mg | ORAL_TABLET | Freq: Every day | ORAL | 1 refills | Status: DC

## 2019-10-25 NOTE — Telephone Encounter (Signed)
Received call Caryl Pina from  [facility] stating that the medication was approved through 10/23/2020. Pharmacy made aware.

## 2019-10-26 ENCOUNTER — Other Ambulatory Visit: Payer: Self-pay | Admitting: *Deleted

## 2019-10-26 ENCOUNTER — Encounter: Payer: Self-pay | Admitting: Family Medicine

## 2019-10-26 ENCOUNTER — Other Ambulatory Visit: Payer: Self-pay

## 2019-10-26 ENCOUNTER — Ambulatory Visit (INDEPENDENT_AMBULATORY_CARE_PROVIDER_SITE_OTHER): Payer: Medicare Other | Admitting: Family Medicine

## 2019-10-26 VITALS — BP 110/58 | HR 66 | Temp 98.1°F | Resp 16

## 2019-10-26 DIAGNOSIS — R410 Disorientation, unspecified: Secondary | ICD-10-CM

## 2019-10-26 DIAGNOSIS — Z8673 Personal history of transient ischemic attack (TIA), and cerebral infarction without residual deficits: Secondary | ICD-10-CM

## 2019-10-26 DIAGNOSIS — S32010D Wedge compression fracture of first lumbar vertebra, subsequent encounter for fracture with routine healing: Secondary | ICD-10-CM

## 2019-10-26 DIAGNOSIS — Z9181 History of falling: Secondary | ICD-10-CM | POA: Diagnosis not present

## 2019-10-26 DIAGNOSIS — G20 Parkinson's disease: Secondary | ICD-10-CM

## 2019-10-26 DIAGNOSIS — R300 Dysuria: Secondary | ICD-10-CM | POA: Diagnosis not present

## 2019-10-26 DIAGNOSIS — R4182 Altered mental status, unspecified: Secondary | ICD-10-CM | POA: Diagnosis not present

## 2019-10-26 DIAGNOSIS — S32000A Wedge compression fracture of unspecified lumbar vertebra, initial encounter for closed fracture: Secondary | ICD-10-CM | POA: Insufficient documentation

## 2019-10-26 LAB — CBC WITH DIFFERENTIAL/PLATELET
Absolute Monocytes: 510 cells/uL (ref 200–950)
Basophils Absolute: 49 cells/uL (ref 0–200)
Basophils Relative: 1 %
Eosinophils Absolute: 69 cells/uL (ref 15–500)
Eosinophils Relative: 1.4 %
HCT: 39.3 % (ref 38.5–50.0)
Hemoglobin: 13.3 g/dL (ref 13.2–17.1)
Lymphs Abs: 828 cells/uL — ABNORMAL LOW (ref 850–3900)
MCH: 30 pg (ref 27.0–33.0)
MCHC: 33.8 g/dL (ref 32.0–36.0)
MCV: 88.5 fL (ref 80.0–100.0)
MPV: 9.4 fL (ref 7.5–12.5)
Monocytes Relative: 10.4 %
Neutro Abs: 3445 cells/uL (ref 1500–7800)
Neutrophils Relative %: 70.3 %
Platelets: 296 10*3/uL (ref 140–400)
RBC: 4.44 10*6/uL (ref 4.20–5.80)
RDW: 12.1 % (ref 11.0–15.0)
Total Lymphocyte: 16.9 %
WBC: 4.9 10*3/uL (ref 3.8–10.8)

## 2019-10-26 LAB — COMPREHENSIVE METABOLIC PANEL
AG Ratio: 1.7 (calc) (ref 1.0–2.5)
ALT: 10 U/L (ref 9–46)
AST: 32 U/L (ref 10–35)
Albumin: 4.1 g/dL (ref 3.6–5.1)
Alkaline phosphatase (APISO): 154 U/L — ABNORMAL HIGH (ref 35–144)
BUN: 19 mg/dL (ref 7–25)
CO2: 30 mmol/L (ref 20–32)
Calcium: 9.5 mg/dL (ref 8.6–10.3)
Chloride: 103 mmol/L (ref 98–110)
Creat: 0.94 mg/dL (ref 0.70–1.18)
Globulin: 2.4 g/dL (calc) (ref 1.9–3.7)
Glucose, Bld: 98 mg/dL (ref 65–99)
Potassium: 5 mmol/L (ref 3.5–5.3)
Sodium: 139 mmol/L (ref 135–146)
Total Bilirubin: 0.5 mg/dL (ref 0.2–1.2)
Total Protein: 6.5 g/dL (ref 6.1–8.1)

## 2019-10-26 LAB — URINALYSIS, ROUTINE W REFLEX MICROSCOPIC
Bilirubin Urine: NEGATIVE
Glucose, UA: NEGATIVE
Hgb urine dipstick: NEGATIVE
Ketones, ur: NEGATIVE
Leukocytes,Ua: NEGATIVE
Nitrite: NEGATIVE
Protein, ur: NEGATIVE
Specific Gravity, Urine: 1.025 (ref 1.001–1.03)
pH: 6.5 (ref 5.0–8.0)

## 2019-10-26 LAB — AMMONIA: Ammonia: 28 umol/L (ref <=72)

## 2019-10-26 LAB — TSH: TSH: 1.06 mIU/L (ref 0.40–4.50)

## 2019-10-26 LAB — CK: Total CK: 197 U/L — ABNORMAL HIGH (ref 44–196)

## 2019-10-26 MED ORDER — QUETIAPINE FUMARATE 50 MG PO TABS: 50.0000 mg | ORAL_TABLET | Freq: Every day | ORAL | 0 refills | Status: DC

## 2019-10-26 NOTE — Patient Instructions (Signed)
We will call with lab results F/U pending results  

## 2019-10-26 NOTE — Assessment & Plan Note (Addendum)
This is a very challenging situation as he has multiple issues that could be contributing to the hallucinations and paranoia.  This could just be progression of his Parkinson but it is interesting that it started right after the fall and new medications I do query if the hydrocodone exacerbated things.  He has been off of the medication however for over a week and still having symptoms.  Family was concerned about possible serotonin syndrome.  He is on duloxetine and he is having some of the jerks but he is not had consistent jerking this was not noted during the exam and he has had episodes of hallucinations before so I would not jump to discontinue the duloxetine at this time.  Other possibility is infection.  His lung exam was normal oxygen sat was normal.  He did not appear toxic.  We did attempt to get a urine sample in the office but he was unable to leave one.  His wife will bring best back a sample from home.  Metabolic abnormalities also possibility so I will check stat labs including TSH along with CK level and ammonia.  There is also the possibility that he has had another cerebral event.  He did have a fall a few weeks ago he does have history of TIA/stroke is a possibility.  At this point however I would get an MRI of the brain since we are so far out from the fall in order to fully evaluate for the stroke.  But sitting in the office he appeared to be more at his baseline.  He states that he remembers seeing the hallucinations last night but does not recall seeing anything this morning.  If his labs are normal I think that we will need to proceed with imaging of his brain.   At this time he is going to complete the Bactrim and continue the Seroquel at 25 mg as his symptoms were already present before starting the medication.  If anything we may need to increase this to 50 mg based on all the above.  We will also discuss his case with his PCP

## 2019-10-26 NOTE — Addendum Note (Signed)
Addended by: Vic Blackbird F on: 10/26/2019 03:40 PM   Modules accepted: Orders

## 2019-10-26 NOTE — Progress Notes (Addendum)
Subjective:    Patient ID: BILL WIED, male    DOB: 1946/08/09, 74 y.o.   MRN: [id number]  Patient presents for Hallucinations (increased paranoia )  Patient here with his wife secondary to altered mental status.  Over the past couple weeks he has had progressive problems with paranoia and hallucinations.  He does have underlying Parkinson's disease with some dementia.  He is followed by the movement disorders clinic at [facility] his last visit was back in November when he saw the nurse practitioner.  His actual physician Dr. Harlow Asa is actually retired.  He has been on Sinemet as well as Aricept and Cymbalta for quite some time. Has had multiple issues over the past few months.  Back in November he had an episode of severe hypotension.  I thought that this may have led to a TIA.  He was started on Florinef and midodrine to keep his blood pressure elevated and he was seen by vascular surgeon Dr. Hartford Poli at [facility].  Dr. Hartford Poli agreed with continuing the orthostatic medications.  He was referred locally to [facility] neurology secondary to his TIA he was seen in January and had 2D echo done along with carotid Dopplers.  His carotid Dopplers were fairly normal for his age.  Not see the recent echo but I do see one from October 2019 which showed a preserved ejection fraction of 65 to 70% with mild diastolic dysfunction.  After the above events he was seen in the emergency room on February 7.  Of note he had had his COVID-19 vaccine the day before but did not have any acute reaction.  He sustained a fall February 2, which is his wife  states it is very common for him in the setting of his gait instability.  He typically uses a Rollator or than his wheelchair.  He was complaining of back pain a few days later so she went ahead and sent him to the emergency room on the seventh.  He was found to have a compression fracture at L1 and he was prescribed hydrocodone.  He was  given approximately 4 doses of the medication his wife noted that he was talking out of his head more and was starting to hallucinate so she discontinued the narcotic his last dose was on Tuesday, February 9.  Even after discontinuing the medication he continued to have hallucinations throughout the day he would see people that were not there or talk about things that were not present.  She had a telephone visit with my partner on December 12.  With his significant history he did recommend going to the emergency room to be fully evaluated but they declined at that time.  Differentials included worsening of his Parkinson's disease with paranoia hallucinations as he has had this in the past 1 occasion he actually had it with medication Mirapex.  Other possibility words overt infection with either pneumonia or urinary tract infection or some type of metabolic disturbance.  He went ahead and treated him with Bactrim to cover for urinary tract infection and he was started on Seroquel for the sundowning and hallucinations. Per his wife she has not seen much improvement with either medication.  He never complained of any urinary symptoms but difficult to tell as they did not bring in a sample prior to starting the medication.  He has not had any cough or congestion.  He has not had any documented fever.  She states that he  was clammy last night but last night was also one of his worst where he was seeing hunters in their backyard along with tractors.  There was also concern about possible serotonin syndrome.  She noted back on the ninth that he was having jerking episodes.  She actually kept a daily log of his symptoms since he went to the emergency room.  He was sometimes jerking his upper body or the lower body but not constant.  He has a tremor in his hands at baseline the left is typically worse than the right. But no change in his appetite no change in his bowel or bladder that she can tell. When he fell there  was no recollection of if he hit his head or not.  He often complains of headache because he has degenerative disc disease in his cervical spine.  She did not see any bruising on the head there was no lacerations.  With regards to COVID-19 symptoms he has not had any and she has not been exposed.   No she did give him a couple doses of ibuprofen for pain after she stopped the hydrocodone also discontinued that when he continues to have changes in his mentation Review Of Systems: Per wife  GEN- denies fatigue, fever, weight loss,weakness, recent illness HEENT- denies eye drainage, change in vision, nasal discharge, CVS- denies chest pain, palpitations RESP- denies SOB, cough, wheeze ABD- denies N/V, change in stools, abd pain GU- denies dysuria, hematuria, dribbling, incontinence MSK- + joint pain, muscle aches, injury Neuro- + headache, dizziness, syncope, seizure activity       Objective:    BP (!) 110/58   Pulse 66   Temp 98.1 F (36.7 C) (Temporal)   Resp 16   SpO2 97%  GEN- NAD, alert and oriented to person/place sitting in wheelchair parent hallucinations, elderly thin male HEENT- PERRL, EOMI, non injected sclera, pink conjunctiva, MMM, oropharynx clear Neck- Supple, no thyromegaly CVS- RRR, no murmur RESP-CTAB ABD-NABS,soft,NT,ND Neuro- CNII-XII grossly in tact, normal speech, no facial droop, sitting in wheelchair Psych- pleasant, answered questions when asked directly  EXT- No edema Pulses- Radial 2+        Assessment & Plan:  Labs came back normal.  I do not see any metabolic disturbance or sign of infection.  Urinalysis was also normal.  I will  proceed with MRI of the brain, increase seroquel to 50mg  at bedtime    Problem List Items Addressed This Visit      Unprioritized   Lumbar compression fracture ([place])   Parkinsonian syndrome ([place])    This is a very challenging situation as he has multiple issues that could be contributing to the hallucinations and  paranoia.  This could just be progression of his Parkinson but it is interesting that it started right after the fall and new medications I do query if the hydrocodone exacerbated things.  He has been off of the medication however for over a week and still having symptoms.  Family was concerned about possible serotonin syndrome.  He is on duloxetine and he is having some of the jerks but he is not had consistent jerking this was not noted during the exam and he has had episodes of hallucinations before so I would not jump to discontinue the duloxetine at this time.  Other possibility is infection.  His lung exam was normal oxygen sat was normal.  He did not appear toxic.  We did attempt to get a urine sample in the office but  he was unable to leave one.  His wife will bring best back a sample from home.  Metabolic abnormalities also possibility so I will check stat labs including TSH along with CK level and ammonia.  There is also the possibility that he has had another cerebral event.  He did have a fall a few weeks ago he does have history of TIA/stroke is a possibility.  At this point however I would get an MRI of the brain since we are so far out from the fall in order to fully evaluate for the stroke.  But sitting in the office he appeared to be more at his baseline.  He states that he remembers seeing the hallucinations last night but does not recall seeing anything this morning.  If his labs are normal I think that we will need to proceed with imaging of his brain.   At this time he is going to complete the Bactrim and continue the Seroquel at 25 mg as his symptoms were already present before starting the medication.  If anything we may need to increase this to 50 mg based on all the above.  We will also discuss his case with his PCP       Other Visit Diagnoses    Altered mental status, unspecified altered mental status type    -  Primary   Relevant Orders   CBC with Differential/Platelet    Comprehensive metabolic panel   TSH   Ammonia   Urinalysis, Routine w reflex microscopic   Urine Culture   CK      Note: This dictation was prepared with Dragon dictation along with smaller phrase technology. Any transcriptional errors that result from this process are unintentional.

## 2019-10-27 LAB — URINE CULTURE
MICRO NUMBER:: 10159690
Result:: NO GROWTH
SPECIMEN QUALITY:: ADEQUATE

## 2019-10-28 ENCOUNTER — Ambulatory Visit (HOSPITAL_COMMUNITY)
Admission: RE | Admit: 2019-10-28 | Discharge: 2019-10-28 | Disposition: A | Discharge status: Home / Self Care | Payer: Medicare Other | Source: Ambulatory Visit | Attending: Family Medicine | Admitting: Family Medicine

## 2019-10-28 ENCOUNTER — Ambulatory Visit (INDEPENDENT_AMBULATORY_CARE_PROVIDER_SITE_OTHER): Payer: Medicare Other | Admitting: Family Medicine

## 2019-10-28 ENCOUNTER — Other Ambulatory Visit: Payer: Self-pay

## 2019-10-28 DIAGNOSIS — Z9181 History of falling: Secondary | ICD-10-CM | POA: Insufficient documentation

## 2019-10-28 DIAGNOSIS — R410 Disorientation, unspecified: Secondary | ICD-10-CM | POA: Diagnosis not present

## 2019-10-28 DIAGNOSIS — G20 Parkinson's disease: Secondary | ICD-10-CM

## 2019-10-28 DIAGNOSIS — R443 Hallucinations, unspecified: Secondary | ICD-10-CM

## 2019-10-28 DIAGNOSIS — R4182 Altered mental status, unspecified: Secondary | ICD-10-CM | POA: Insufficient documentation

## 2019-10-28 DIAGNOSIS — G20C Parkinsonism, unspecified: Secondary | ICD-10-CM

## 2019-10-28 DIAGNOSIS — Z8673 Personal history of transient ischemic attack (TIA), and cerebral infarction without residual deficits: Secondary | ICD-10-CM | POA: Diagnosis not present
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
## 2019-10-31 ENCOUNTER — Ambulatory Visit (INDEPENDENT_AMBULATORY_CARE_PROVIDER_SITE_OTHER): Payer: Medicare Other | Admitting: Family Medicine

## 2019-10-31 ENCOUNTER — Other Ambulatory Visit: Payer: Self-pay

## 2019-10-31 DIAGNOSIS — R443 Hallucinations, unspecified: Secondary | ICD-10-CM

## 2019-10-31 DIAGNOSIS — G20 Parkinson's disease: Secondary | ICD-10-CM | POA: Diagnosis not present

## 2019-10-31 DIAGNOSIS — R4182 Altered mental status, unspecified: Secondary | ICD-10-CM

## 2019-10-31 MED ORDER — QUETIAPINE FUMARATE 50 MG PO TABS: 50.0000 mg | ORAL_TABLET | Freq: Every day | ORAL | 0 refills | Status: DC
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
## 2019-11-01 ENCOUNTER — Other Ambulatory Visit: Payer: Self-pay | Admitting: Family Medicine

## 2019-11-03 ENCOUNTER — Ambulatory Visit (HOSPITAL_COMMUNITY): Payer: Medicare Other | Attending: Cardiovascular Disease

## 2019-11-03 ENCOUNTER — Other Ambulatory Visit: Payer: Self-pay

## 2019-11-03 DIAGNOSIS — G459 Transient cerebral ischemic attack, unspecified: Secondary | ICD-10-CM

## 2019-11-03 DIAGNOSIS — I959 Hypotension, unspecified: Secondary | ICD-10-CM | POA: Diagnosis not present

## 2019-11-04 ENCOUNTER — Encounter: Payer: Self-pay | Admitting: Family Medicine

## 2019-11-07 ENCOUNTER — Telehealth: Payer: Self-pay | Admitting: *Deleted

## 2019-11-07 NOTE — Telephone Encounter (Signed)
Spoke with wife, Horris Latino on [place] and informed her his echocardiogram showed unremarkable study with no major findings. She verbalized understanding, appreciation.

## 2019-11-08 ENCOUNTER — Ambulatory Visit: Payer: Medicare Other | Attending: Internal Medicine

## 2019-11-08 DIAGNOSIS — Z23 Encounter for immunization: Secondary | ICD-10-CM | POA: Insufficient documentation

## 2019-11-08 NOTE — Progress Notes (Signed)
   Covid-19 Vaccination Clinic  Name:  Alex Frazier    MRN: [id number] DOB: 1945-10-17  11/08/2019  [name] was observed post Covid-19 immunization for 15 minutes without incident. He was provided with Vaccine Information Sheet and instruction to access the V-Safe system.   [name] was instructed to call 911 with any severe reactions post vaccine: Marland Kitchen Difficulty breathing  . Swelling of face and throat  . A fast heartbeat  . A bad rash all over body  . Dizziness and weakness   Immunizations Administered    Name Date Dose VIS Date Route   Pfizer COVID-19 Vaccine 11/08/2019  3:36 PM 0.3 mL 08/19/2019 Intramuscular   Manufacturer: [place]   Lot: [id number]   [place]: [id number]

## 2019-11-15 DIAGNOSIS — R0902 Hypoxemia: Secondary | ICD-10-CM | POA: Diagnosis not present

## 2019-11-15 DIAGNOSIS — R52 Pain, unspecified: Secondary | ICD-10-CM | POA: Diagnosis not present

## 2019-11-15 DIAGNOSIS — M79605 Pain in left leg: Secondary | ICD-10-CM | POA: Diagnosis not present

## 2019-11-17 ENCOUNTER — Other Ambulatory Visit: Payer: Self-pay

## 2019-11-17 ENCOUNTER — Ambulatory Visit (INDEPENDENT_AMBULATORY_CARE_PROVIDER_SITE_OTHER): Payer: Medicare Other | Admitting: Family Medicine

## 2019-11-17 ENCOUNTER — Telehealth: Payer: Self-pay | Admitting: *Deleted

## 2019-11-17 DIAGNOSIS — I951 Orthostatic hypotension: Secondary | ICD-10-CM | POA: Diagnosis not present

## 2019-11-17 DIAGNOSIS — G20 Parkinson's disease: Secondary | ICD-10-CM

## 2019-11-17 MED ORDER — DROXIDOPA 100 MG PO CAPS: 100.0000 mg | ORAL_CAPSULE | Freq: Three times a day (TID) | ORAL | 0 refills | Status: DC

## 2019-11-17 NOTE — Telephone Encounter (Signed)
Call placed to patient wife, Horris Latino.   Reports that she has noted patient BP drops down to 50/30. States that she had x1 episode where patient could not wake up. States that she contacted EMS. EMS talked family thorough sternal massage and patient did wake.   Reports that BP is dropping after taking Seroquel. Reports that midodrine does bring it back to normal.   Inquired as to if patient should stop seroquel at this time. Reports that he is no longer hallucinating.   Also reports that balance and mobility have worsened since stopping night time dose of Sinemet. States that she would discuss with PCP at Telehealth visit on 11/17/2019.

## 2019-11-17 NOTE — Progress Notes (Signed)
Subjective:    Patient ID: Alex Frazier, male    DOB: 1945/12/15, 74 y.o.   MRN: [id number]  HPI Patient is a 74 year old Caucasian male who is being seen today as a telephone visit.  His wife is providing the history due to his dementia and Parkinson's disease.  Phone call began at 340.  Phone call concluded at 355.  She consents to conduct this interview via telephone.  Last Friday, the patient's blood pressure suddenly dropped to 50/32.  He experienced a similar drop in his blood pressure on Monday night to 50 over 30s.  In each situation he starts to feel nauseated and dizzy and extremely lightheaded and difficult to arouse.  The 2 events occurred shortly after the patient taking his Seroquel.  Tuesday night, the patient became unresponsive while watching TV.  The wife thought he was sleeping however she was unable to wake him up no matter how much stimulation she provided for him.  EMS was called.  The family helped her lower the patient into the floor.  Once he was in the floor and had sternal rub, he regained consciousness.  By the time EMS arrived, his blood pressure was "normal" and the patient was back to his normal baseline.  However he is having significant drops in his blood pressure.  Typically these are occurring at night shortly after the dose of Seroquel.  Therefore the patient's wife was interested in stopping the Seroquel.  She states that the hallucinations have subsided.  He is no longer violent or delirious. Past Medical History:  Diagnosis Date  . B12 deficiency    Borderline  . Bulging discs   . Degenerative disc disease, cervical   . Gastritis   . Hyperlipidemia   . Neuropathy   . Parkinson's disease ([place])   . Parkinsonian syndrome ([place])   . Peripheral neuropathy   . TIA (transient ischemic attack)    TIA symptoms from hypotension   Past Surgical History:  Procedure Laterality Date  . APPENDECTOMY    . BACK SURGERY     Current Outpatient Medications on File  Prior to Visit  Medication Sig Dispense Refill  . carbidopa-levodopa (SINEMET IR) 25-100 MG tablet Take 2.5 tablets by mouth 3 (three) times daily.     . Carbidopa-Levodopa ER (SINEMET CR) 25-100 MG tablet controlled release Take 1 tablet by mouth at bedtime.  2  . cholecalciferol (VITAMIN D3) 25 MCG (1000 UNIT) tablet Take 2,000 Units by mouth daily.    . Cyanocobalamin (VITAMIN B 12 PO) Take 1 tablet by mouth daily.    Marland Kitchen donepezil (ARICEPT) 5 MG tablet Take 5 mg by mouth at bedtime.     . DULoxetine (CYMBALTA) 60 MG capsule Take 1 capsule (60 mg total) by mouth daily. 90 capsule 3  . fludrocortisone (FLORINEF) 0.1 MG tablet TAKE 2 TABLETS BY MOUTH DAILY. GENERIC EQUIVALENT FOR FLORINEF. (Patient taking differently: Take 0.1 mg by mouth daily. ) 180 tablet 0  . fluticasone (FLONASE) 50 MCG/ACT nasal spray Place 2 sprays into both nostrils daily. (Patient taking differently: Place 2 sprays into both nostrils daily as needed. ) 16 g 6  . ibuprofen (ADVIL) 200 MG tablet Take 200-400 mg by mouth every 6 (six) hours as needed for mild pain or moderate pain.    . midodrine (PROAMATINE) 10 MG tablet Take 1 tablet (10 mg total) by mouth 3 (three) times daily. (Patient taking differently: Take 0-10 mg by mouth 3 (three) times daily as needed. Patient  gets amount based off blood pressure readings) 270 tablet 3  . naproxen sodium (ALEVE) 220 MG tablet Take 220 mg by mouth daily as needed.    . Polyethylene Glycol 3350 (MIRALAX PO) Take 17 g by mouth daily.     . pravastatin (PRAVACHOL) 20 MG tablet TAKE 1 TABLET BY MOUTH DAILY AT 6 PM GENERIC EQUIVALENT FOR PRAVACHOL 90 tablet 2  . QUEtiapine (SEROQUEL) 50 MG tablet Take 1 tablet (50 mg total) by mouth at bedtime. This is a permanent replacement for the 25 mg dose. 90 tablet 0  . sulfamethoxazole-trimethoprim (BACTRIM DS) 800-160 MG tablet Take 1 tablet by mouth 2 (two) times daily. 14 tablet 0   No current facility-administered medications on file prior to  visit.   Allergies  Allergen Reactions  . Indomethacin   . Lyrica [Pregabalin]    Social History   Socioeconomic History  . Marital status: Married    Spouse name: Not on file  . Number of children: 3  . Years of education: Not on file  . Highest education level: High school graduate  Occupational History  . Not on file  Tobacco Use  . Smoking status: Never Smoker  . Smokeless tobacco: Never Used  Substance and Sexual Activity  . Alcohol use: No  . Drug use: No  . Sexual activity: Not on file  Other Topics Concern  . Not on file  Social History Narrative   Lives at home with his wife   Right handed   Caffeine: none   Social Determinants of Health   Financial Resource Strain:   . Difficulty of Paying Living Expenses:   Food Insecurity:   . Worried About Charity fundraiser in the Last Year:   . Arboriculturist in the Last Year:   Transportation Needs:   . Film/video editor (Medical):   Marland Kitchen Lack of Transportation (Non-Medical):   Physical Activity:   . Days of Exercise per Week:   . Minutes of Exercise per Session:   Stress:   . Feeling of Stress :   Social Connections:   . Frequency of Communication with Friends and Family:   . Frequency of Social Gatherings with Friends and Family:   . Attends Religious Services:   . Active Member of Clubs or Organizations:   . Attends Archivist Meetings:   Marland Kitchen Marital Status:   Intimate Partner Violence:   . Fear of Current or Ex-Partner:   . Emotionally Abused:   Marland Kitchen Physically Abused:   . Sexually Abused:       Review of Systems  All other systems reviewed and are negative.      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  He is already on fludrocortisone as well as  midodrine 10 mg 3 times a day and is still having breakthrough episodes of orthostatic hypotension.  I believe his disease is reaching end-stage.  Had a long discussion with the patient's wife.  I do not believe that there will be any curative we can try to mitigate his symptoms as much as possible.  Therefore I recommended discontinuing Seroquel in case this medication is affecting his blood pressure.  I believe the chance that this is low however it does seem to be coincidental and occurring  at night.  If this does not help we can add droxidopa 100 mg p.o. 3 times daily as needed hypertension.  Given the severity that the patient is experiencing we may need to start dosing of medication standing at night and then as needed during the daytime if the blood pressure drops do not improve after stopping Seroquel.  Could also consider for rivastigmine if for some reason the patient cannot tolerate the droxidopa.

## 2019-11-19 ENCOUNTER — Other Ambulatory Visit: Payer: Self-pay | Admitting: Family Medicine

## 2019-11-21 ENCOUNTER — Telehealth: Payer: Self-pay | Admitting: *Deleted

## 2019-11-21 NOTE — Telephone Encounter (Signed)
Received request from pharmacy for PA on Droxidopa.   PA submitted.   Dx: I95.1- orthostatic hypotension.  Your information has been submitted to [employer]. [employer] is reviewing the PA request and you will receive an electronic response. You may check for the updated outcome later by reopening this request. The standard fax determination will also be sent to you directly.  If you have any questions about your PA submission, contact [employer] at [phone].

## 2019-11-28 NOTE — Telephone Encounter (Signed)
Call placed to [employer] to follow up on PA.   Was advised that PA needs to be sent to [facility]. Call placed to [place] ([phone]~ telephone). Was advised that no fax has been received from [facility].   Expedited PA initiated. Was advised that PA normally requires consult with cardiology or neurology, but will send through for review anyway.

## 2019-11-29 NOTE — Telephone Encounter (Signed)
Received VM from [place], [place].   PA denied as PCP is not a specialist (Neurology/ cardiology).   Patient wife contacted fellow nurse. Advised that since patient BP noted stable, she will not proceed further with trying to obtain medication.   Message to be closed.

## 2019-12-27 DIAGNOSIS — H52203 Unspecified astigmatism, bilateral: Secondary | ICD-10-CM | POA: Diagnosis not present

## 2019-12-27 DIAGNOSIS — H04123 Dry eye syndrome of bilateral lacrimal glands: Secondary | ICD-10-CM | POA: Diagnosis not present

## 2019-12-27 DIAGNOSIS — H524 Presbyopia: Secondary | ICD-10-CM | POA: Diagnosis not present

## 2019-12-27 DIAGNOSIS — Z961 Presence of intraocular lens: Secondary | ICD-10-CM | POA: Diagnosis not present

## 2020-02-21 DIAGNOSIS — R259 Unspecified abnormal involuntary movements: Secondary | ICD-10-CM | POA: Diagnosis not present

## 2020-02-21 DIAGNOSIS — Z79899 Other long term (current) drug therapy: Secondary | ICD-10-CM | POA: Diagnosis not present

## 2020-02-21 DIAGNOSIS — G20 Parkinson's disease: Secondary | ICD-10-CM | POA: Diagnosis not present

## 2020-02-23 ENCOUNTER — Telehealth: Payer: Self-pay | Admitting: Family Medicine

## 2020-02-23 DIAGNOSIS — G20 Parkinson's disease: Secondary | ICD-10-CM | POA: Diagnosis not present

## 2020-02-23 DIAGNOSIS — I959 Hypotension, unspecified: Secondary | ICD-10-CM | POA: Diagnosis not present

## 2020-02-23 DIAGNOSIS — Z9181 History of falling: Secondary | ICD-10-CM | POA: Diagnosis not present

## 2020-02-23 DIAGNOSIS — G609 Hereditary and idiopathic neuropathy, unspecified: Secondary | ICD-10-CM | POA: Diagnosis not present

## 2020-02-23 NOTE — Telephone Encounter (Signed)
Received a phone call from [place] therapist with [facility] stating that patient's BP today was 72/50. She would like to know if you would adjust the parameters for this patient's BP so that they do not have to call us every time they get a low reading as they have a parameter to call every time it is less than 90/60

## 2020-02-24 NOTE — Telephone Encounter (Signed)
Spoke with Sonia Baller from [facility] and gave her new orders per Dr. Dennard Schaumann. Patient verbalized understanding

## 2020-02-24 NOTE — Telephone Encounter (Signed)
New order: Check BP only if symptomatic.  Administer midodrine 10 mg potid prn BP less than 90/50

## 2020-02-29 DIAGNOSIS — I959 Hypotension, unspecified: Secondary | ICD-10-CM | POA: Diagnosis not present

## 2020-02-29 DIAGNOSIS — Z9181 History of falling: Secondary | ICD-10-CM | POA: Diagnosis not present

## 2020-02-29 DIAGNOSIS — G20 Parkinson's disease: Secondary | ICD-10-CM | POA: Diagnosis not present

## 2020-02-29 DIAGNOSIS — G609 Hereditary and idiopathic neuropathy, unspecified: Secondary | ICD-10-CM | POA: Diagnosis not present

## 2020-03-02 DIAGNOSIS — I959 Hypotension, unspecified: Secondary | ICD-10-CM | POA: Diagnosis not present

## 2020-03-02 DIAGNOSIS — Z9181 History of falling: Secondary | ICD-10-CM | POA: Diagnosis not present

## 2020-03-02 DIAGNOSIS — G20 Parkinson's disease: Secondary | ICD-10-CM | POA: Diagnosis not present

## 2020-03-02 DIAGNOSIS — G609 Hereditary and idiopathic neuropathy, unspecified: Secondary | ICD-10-CM | POA: Diagnosis not present

## 2020-03-06 DIAGNOSIS — Z9181 History of falling: Secondary | ICD-10-CM | POA: Diagnosis not present

## 2020-03-06 DIAGNOSIS — G609 Hereditary and idiopathic neuropathy, unspecified: Secondary | ICD-10-CM | POA: Diagnosis not present

## 2020-03-06 DIAGNOSIS — I959 Hypotension, unspecified: Secondary | ICD-10-CM | POA: Diagnosis not present

## 2020-03-06 DIAGNOSIS — G20 Parkinson's disease: Secondary | ICD-10-CM | POA: Diagnosis not present

## 2020-03-09 DIAGNOSIS — G609 Hereditary and idiopathic neuropathy, unspecified: Secondary | ICD-10-CM | POA: Diagnosis not present

## 2020-03-09 DIAGNOSIS — Z9181 History of falling: Secondary | ICD-10-CM | POA: Diagnosis not present

## 2020-03-09 DIAGNOSIS — I959 Hypotension, unspecified: Secondary | ICD-10-CM | POA: Diagnosis not present

## 2020-03-09 DIAGNOSIS — G20 Parkinson's disease: Secondary | ICD-10-CM | POA: Diagnosis not present

## 2020-03-14 DIAGNOSIS — Z9181 History of falling: Secondary | ICD-10-CM | POA: Diagnosis not present

## 2020-03-14 DIAGNOSIS — I959 Hypotension, unspecified: Secondary | ICD-10-CM | POA: Diagnosis not present

## 2020-03-14 DIAGNOSIS — G20 Parkinson's disease: Secondary | ICD-10-CM | POA: Diagnosis not present

## 2020-03-14 DIAGNOSIS — G609 Hereditary and idiopathic neuropathy, unspecified: Secondary | ICD-10-CM | POA: Diagnosis not present

## 2020-03-16 DIAGNOSIS — Z9181 History of falling: Secondary | ICD-10-CM | POA: Diagnosis not present

## 2020-03-16 DIAGNOSIS — I959 Hypotension, unspecified: Secondary | ICD-10-CM | POA: Diagnosis not present

## 2020-03-16 DIAGNOSIS — G609 Hereditary and idiopathic neuropathy, unspecified: Secondary | ICD-10-CM | POA: Diagnosis not present

## 2020-03-16 DIAGNOSIS — G20 Parkinson's disease: Secondary | ICD-10-CM | POA: Diagnosis not present

## 2020-03-17 ENCOUNTER — Other Ambulatory Visit: Payer: Self-pay

## 2020-03-17 ENCOUNTER — Inpatient Hospital Stay (HOSPITAL_COMMUNITY)
Admission: EM | Admit: 2020-03-17 | Discharge: 2020-03-22 | DRG: 544 | Disposition: A | Discharge status: Skilled Nursing Facility | Payer: Medicare Other | Attending: Internal Medicine | Admitting: Internal Medicine

## 2020-03-17 ENCOUNTER — Emergency Department (HOSPITAL_COMMUNITY): Payer: Medicare Other

## 2020-03-17 DIAGNOSIS — I10 Essential (primary) hypertension: Secondary | ICD-10-CM | POA: Diagnosis present

## 2020-03-17 DIAGNOSIS — R079 Chest pain, unspecified: Secondary | ICD-10-CM | POA: Diagnosis not present

## 2020-03-17 DIAGNOSIS — Z79899 Other long term (current) drug therapy: Secondary | ICD-10-CM | POA: Diagnosis not present

## 2020-03-17 DIAGNOSIS — F329 Major depressive disorder, single episode, unspecified: Secondary | ICD-10-CM | POA: Diagnosis present

## 2020-03-17 DIAGNOSIS — Z20822 Contact with and (suspected) exposure to covid-19: Secondary | ICD-10-CM | POA: Diagnosis present

## 2020-03-17 DIAGNOSIS — G909 Disorder of the autonomic nervous system, unspecified: Secondary | ICD-10-CM | POA: Diagnosis present

## 2020-03-17 DIAGNOSIS — E538 Deficiency of other specified B group vitamins: Secondary | ICD-10-CM | POA: Diagnosis present

## 2020-03-17 DIAGNOSIS — R0902 Hypoxemia: Secondary | ICD-10-CM | POA: Diagnosis not present

## 2020-03-17 DIAGNOSIS — Z833 Family history of diabetes mellitus: Secondary | ICD-10-CM

## 2020-03-17 DIAGNOSIS — Z91048 Other nonmedicinal substance allergy status: Secondary | ICD-10-CM

## 2020-03-17 DIAGNOSIS — S32030D Wedge compression fracture of third lumbar vertebra, subsequent encounter for fracture with routine healing: Secondary | ICD-10-CM | POA: Diagnosis not present

## 2020-03-17 DIAGNOSIS — F29 Unspecified psychosis not due to a substance or known physiological condition: Secondary | ICD-10-CM | POA: Diagnosis not present

## 2020-03-17 DIAGNOSIS — M47817 Spondylosis without myelopathy or radiculopathy, lumbosacral region: Secondary | ICD-10-CM | POA: Diagnosis present

## 2020-03-17 DIAGNOSIS — S0990XA Unspecified injury of head, initial encounter: Secondary | ICD-10-CM | POA: Diagnosis not present

## 2020-03-17 DIAGNOSIS — Z888 Allergy status to other drugs, medicaments and biological substances status: Secondary | ICD-10-CM | POA: Diagnosis not present

## 2020-03-17 DIAGNOSIS — M4856XA Collapsed vertebra, not elsewhere classified, lumbar region, initial encounter for fracture: Secondary | ICD-10-CM | POA: Diagnosis not present

## 2020-03-17 DIAGNOSIS — K5909 Other constipation: Secondary | ICD-10-CM | POA: Diagnosis present

## 2020-03-17 DIAGNOSIS — W1789XA Other fall from one level to another, initial encounter: Secondary | ICD-10-CM | POA: Diagnosis present

## 2020-03-17 DIAGNOSIS — S32000A Wedge compression fracture of unspecified lumbar vertebra, initial encounter for closed fracture: Secondary | ICD-10-CM | POA: Diagnosis present

## 2020-03-17 DIAGNOSIS — E785 Hyperlipidemia, unspecified: Secondary | ICD-10-CM | POA: Diagnosis present

## 2020-03-17 DIAGNOSIS — M50922 Unspecified cervical disc disorder at C5-C6 level: Secondary | ICD-10-CM | POA: Diagnosis not present

## 2020-03-17 DIAGNOSIS — I959 Hypotension, unspecified: Secondary | ICD-10-CM | POA: Diagnosis present

## 2020-03-17 DIAGNOSIS — Z7401 Bed confinement status: Secondary | ICD-10-CM | POA: Diagnosis not present

## 2020-03-17 DIAGNOSIS — S299XXA Unspecified injury of thorax, initial encounter: Secondary | ICD-10-CM | POA: Diagnosis not present

## 2020-03-17 DIAGNOSIS — Z820 Family history of epilepsy and other diseases of the nervous system: Secondary | ICD-10-CM | POA: Diagnosis not present

## 2020-03-17 DIAGNOSIS — F419 Anxiety disorder, unspecified: Secondary | ICD-10-CM | POA: Diagnosis present

## 2020-03-17 DIAGNOSIS — S32019D Unspecified fracture of first lumbar vertebra, subsequent encounter for fracture with routine healing: Secondary | ICD-10-CM | POA: Diagnosis not present

## 2020-03-17 DIAGNOSIS — S32030A Wedge compression fracture of third lumbar vertebra, initial encounter for closed fracture: Secondary | ICD-10-CM | POA: Diagnosis not present

## 2020-03-17 DIAGNOSIS — G20 Parkinson's disease: Secondary | ICD-10-CM | POA: Diagnosis present

## 2020-03-17 DIAGNOSIS — Z8249 Family history of ischemic heart disease and other diseases of the circulatory system: Secondary | ICD-10-CM | POA: Diagnosis not present

## 2020-03-17 DIAGNOSIS — S3993XA Unspecified injury of pelvis, initial encounter: Secondary | ICD-10-CM | POA: Diagnosis not present

## 2020-03-17 DIAGNOSIS — S0993XA Unspecified injury of face, initial encounter: Secondary | ICD-10-CM | POA: Diagnosis not present

## 2020-03-17 DIAGNOSIS — T1490XA Injury, unspecified, initial encounter: Secondary | ICD-10-CM

## 2020-03-17 DIAGNOSIS — S3991XA Unspecified injury of abdomen, initial encounter: Secondary | ICD-10-CM | POA: Diagnosis not present

## 2020-03-17 DIAGNOSIS — S32038D Other fracture of third lumbar vertebra, subsequent encounter for fracture with routine healing: Secondary | ICD-10-CM | POA: Diagnosis not present

## 2020-03-17 DIAGNOSIS — S32050A Wedge compression fracture of fifth lumbar vertebra, initial encounter for closed fracture: Secondary | ICD-10-CM

## 2020-03-17 DIAGNOSIS — M5489 Other dorsalgia: Secondary | ICD-10-CM | POA: Diagnosis not present

## 2020-03-17 DIAGNOSIS — R911 Solitary pulmonary nodule: Secondary | ICD-10-CM | POA: Diagnosis present

## 2020-03-17 DIAGNOSIS — F0280 Dementia in other diseases classified elsewhere without behavioral disturbance: Secondary | ICD-10-CM | POA: Diagnosis present

## 2020-03-17 DIAGNOSIS — S199XXA Unspecified injury of neck, initial encounter: Secondary | ICD-10-CM | POA: Diagnosis not present

## 2020-03-17 DIAGNOSIS — R109 Unspecified abdominal pain: Secondary | ICD-10-CM | POA: Diagnosis not present

## 2020-03-17 DIAGNOSIS — G20A1 Parkinson's disease without dyskinesia, without mention of fluctuations: Secondary | ICD-10-CM | POA: Diagnosis present

## 2020-03-17 DIAGNOSIS — G629 Polyneuropathy, unspecified: Secondary | ICD-10-CM | POA: Diagnosis not present

## 2020-03-17 DIAGNOSIS — Z8673 Personal history of transient ischemic attack (TIA), and cerebral infarction without residual deficits: Secondary | ICD-10-CM | POA: Diagnosis not present

## 2020-03-17 DIAGNOSIS — S32058D Other fracture of fifth lumbar vertebra, subsequent encounter for fracture with routine healing: Secondary | ICD-10-CM | POA: Diagnosis not present

## 2020-03-17 DIAGNOSIS — W19XXXA Unspecified fall, initial encounter: Secondary | ICD-10-CM | POA: Diagnosis not present

## 2020-03-17 DIAGNOSIS — G20C Parkinsonism, unspecified: Secondary | ICD-10-CM | POA: Diagnosis present

## 2020-03-17 DIAGNOSIS — S32020A Wedge compression fracture of second lumbar vertebra, initial encounter for closed fracture: Secondary | ICD-10-CM | POA: Diagnosis not present

## 2020-03-17 DIAGNOSIS — R52 Pain, unspecified: Secondary | ICD-10-CM | POA: Diagnosis not present

## 2020-03-17 DIAGNOSIS — W19XXXD Unspecified fall, subsequent encounter: Secondary | ICD-10-CM | POA: Diagnosis not present

## 2020-03-17 DIAGNOSIS — M2550 Pain in unspecified joint: Secondary | ICD-10-CM | POA: Diagnosis not present

## 2020-03-17 LAB — SARS CORONAVIRUS 2 BY RT PCR (HOSPITAL ORDER, PERFORMED IN ~~LOC~~ HOSPITAL LAB): SARS Coronavirus 2: NEGATIVE

## 2020-03-17 LAB — CBC
HCT: 41.5 % (ref 39.0–52.0)
Hemoglobin: 13.7 g/dL (ref 13.0–17.0)
MCH: 29.4 pg (ref 26.0–34.0)
MCHC: 33 g/dL (ref 30.0–36.0)
MCV: 89.1 fL (ref 80.0–100.0)
Platelets: 242 10*3/uL (ref 150–400)
RBC: 4.66 MIL/uL (ref 4.22–5.81)
RDW: 13.4 % (ref 11.5–15.5)
WBC: 8.6 10*3/uL (ref 4.0–10.5)
nRBC: 0 % (ref 0.0–0.2)

## 2020-03-17 LAB — I-STAT CHEM 8, ED
BUN: 20 mg/dL (ref 8–23)
Calcium, Ion: 1.18 mmol/L (ref 1.15–1.40)
Chloride: 101 mmol/L (ref 98–111)
Creatinine, Ser: 0.9 mg/dL (ref 0.61–1.24)
Glucose, Bld: 104 mg/dL — ABNORMAL HIGH (ref 70–99)
HCT: 40 % (ref 39.0–52.0)
Hemoglobin: 13.6 g/dL (ref 13.0–17.0)
Potassium: 3.9 mmol/L (ref 3.5–5.1)
Sodium: 140 mmol/L (ref 135–145)
TCO2: 26 mmol/L (ref 22–32)

## 2020-03-17 LAB — URINALYSIS, ROUTINE W REFLEX MICROSCOPIC
Bilirubin Urine: NEGATIVE
Glucose, UA: NEGATIVE mg/dL
Hgb urine dipstick: NEGATIVE
Ketones, ur: 5 mg/dL — AB
Leukocytes,Ua: NEGATIVE
Nitrite: NEGATIVE
Protein, ur: NEGATIVE mg/dL
Specific Gravity, Urine: >1.046 — ABNORMAL HIGH (ref 1.005–1.030)
pH: 5 (ref 5.0–8.0)

## 2020-03-17 LAB — BASIC METABOLIC PANEL
Anion gap: 8 (ref 5–15)
BUN: 18 mg/dL (ref 8–23)
CO2: 27 mmol/L (ref 22–32)
Calcium: 9.3 mg/dL (ref 8.9–10.3)
Chloride: 104 mmol/L (ref 98–111)
Creatinine, Ser: 0.91 mg/dL (ref 0.61–1.24)
GFR calc Af Amer: >60 mL/min (ref >60)
GFR calc non Af Amer: >60 mL/min (ref >60)
Glucose, Bld: 110 mg/dL — ABNORMAL HIGH (ref 70–99)
Potassium: 3.8 mmol/L (ref 3.5–5.1)
Sodium: 139 mmol/L (ref 135–145)

## 2020-03-17 LAB — PHOSPHORUS: Phosphorus: 3.3 mg/dL (ref 2.5–4.6)

## 2020-03-17 LAB — SURGICAL PCR SCREEN
MRSA, PCR: NEGATIVE
Staphylococcus aureus: NEGATIVE

## 2020-03-17 LAB — MAGNESIUM: Magnesium: 1.9 mg/dL (ref 1.7–2.4)

## 2020-03-17 MED ORDER — POLYETHYLENE GLYCOL 3350 17 G PO PACK
17.0000 g | PACK | Freq: Every day | ORAL | Status: DC
  Administered 2020-03-18 – 2020-03-22 (×5): 17 g via ORAL
  Filled 2020-03-17 (×5): qty 1

## 2020-03-17 MED ORDER — ONDANSETRON HCL 4 MG/2ML IJ SOLN: 4.0000 mg | Freq: Four times a day (QID) | INTRAMUSCULAR | Status: DC | PRN

## 2020-03-17 MED ORDER — DULOXETINE HCL 60 MG PO CPEP
60.0000 mg | ORAL_CAPSULE | Freq: Every day | ORAL | Status: DC
  Administered 2020-03-17 – 2020-03-21 (×5): 60 mg via ORAL
  Filled 2020-03-17 (×5): qty 1

## 2020-03-17 MED ORDER — ENOXAPARIN SODIUM 40 MG/0.4ML ~~LOC~~ SOLN
40.0000 mg | SUBCUTANEOUS | Status: DC
  Administered 2020-03-17 – 2020-03-21 (×5): 40 mg via SUBCUTANEOUS
  Filled 2020-03-17 (×5): qty 0.4

## 2020-03-17 MED ORDER — CARBIDOPA-LEVODOPA 25-100 MG PO TABS
2.5000 | ORAL_TABLET | ORAL | Status: DC
  Administered 2020-03-17 – 2020-03-22 (×15): 2.5 via ORAL
  Filled 2020-03-17 (×7): qty 3
  Filled 2020-03-17: qty 2.5
  Filled 2020-03-17 (×5): qty 3
  Filled 2020-03-17 (×2): qty 2.5
  Filled 2020-03-17 (×2): qty 3

## 2020-03-17 MED ORDER — ACETAMINOPHEN 650 MG RE SUPP: 650.0000 mg | Freq: Four times a day (QID) | RECTAL | Status: DC | PRN

## 2020-03-17 MED ORDER — ONDANSETRON HCL 4 MG PO TABS: 4.0000 mg | ORAL_TABLET | Freq: Four times a day (QID) | ORAL | Status: DC | PRN

## 2020-03-17 MED ORDER — PRAVASTATIN SODIUM 10 MG PO TABS
20.0000 mg | ORAL_TABLET | Freq: Every day | ORAL | Status: DC
  Administered 2020-03-17 – 2020-03-21 (×5): 20 mg via ORAL
  Filled 2020-03-17 (×5): qty 2

## 2020-03-17 MED ORDER — FLUDROCORTISONE ACETATE 0.1 MG PO TABS
0.1000 mg | ORAL_TABLET | Freq: Every morning | ORAL | Status: DC
  Administered 2020-03-18 – 2020-03-22 (×5): 0.1 mg via ORAL
  Filled 2020-03-17 (×5): qty 1

## 2020-03-17 MED ORDER — CARBIDOPA-LEVODOPA ER 25-100 MG PO TBCR
1.0000 | EXTENDED_RELEASE_TABLET | Freq: Every day | ORAL | Status: DC
  Administered 2020-03-18 – 2020-03-21 (×4): 1 via ORAL
  Filled 2020-03-17 (×6): qty 1

## 2020-03-17 MED ORDER — KETOROLAC TROMETHAMINE 30 MG/ML IJ SOLN: 30.0000 mg | Freq: Four times a day (QID) | INTRAMUSCULAR | Status: DC | PRN

## 2020-03-17 MED ORDER — IOHEXOL 300 MG/ML  SOLN
100.0000 mL | Freq: Once | INTRAMUSCULAR | Status: AC | PRN
  Administered 2020-03-17: 100 mL via INTRAVENOUS

## 2020-03-17 MED ORDER — ACETAMINOPHEN 325 MG PO TABS
650.0000 mg | ORAL_TABLET | Freq: Four times a day (QID) | ORAL | Status: DC | PRN
  Administered 2020-03-17: 650 mg via ORAL
  Filled 2020-03-17: qty 2

## 2020-03-17 MED ORDER — DONEPEZIL HCL 5 MG PO TABS
5.0000 mg | ORAL_TABLET | Freq: Every day | ORAL | Status: DC
  Administered 2020-03-17 – 2020-03-21 (×5): 5 mg via ORAL
  Filled 2020-03-17 (×5): qty 1

## 2020-03-17 MED ORDER — IBUPROFEN 200 MG PO TABS: 200.0000 mg | ORAL_TABLET | Freq: Four times a day (QID) | ORAL | Status: DC | PRN

## 2020-03-17 MED ORDER — VITAMIN D 25 MCG (1000 UNIT) PO TABS
2000.0000 [IU] | ORAL_TABLET | Freq: Every morning | ORAL | Status: DC
  Administered 2020-03-18 – 2020-03-22 (×5): 2000 [IU] via ORAL
  Filled 2020-03-17 (×5): qty 2

## 2020-03-17 NOTE — Plan of Care (Signed)
  Problem: Education: Goal: Knowledge of General Education information will improve Description: Including pain rating scale, medication(s)/side effects and non-pharmacologic comfort measures Outcome: Progressing   Problem: Education: Goal: Knowledge of General Education information will improve Description: Including pain rating scale, medication(s)/side effects and non-pharmacologic comfort measures Outcome: Progressing   Problem: Education: Goal: Knowledge of General Education information will improve Description: Including pain rating scale, medication(s)/side effects and non-pharmacologic comfort measures Outcome: Progressing   Problem: Education: Goal: Knowledge of General Education information will improve Description: Including pain rating scale, medication(s)/side effects and non-pharmacologic comfort measures Outcome: Progressing   Problem: Education: Goal: Knowledge of General Education information will improve Description: Including pain rating scale, medication(s)/side effects and non-pharmacologic comfort measures Outcome: Progressing   Problem: Education: Goal: Knowledge of General Education information will improve Description: Including pain rating scale, medication(s)/side effects and non-pharmacologic comfort measures Outcome: Progressing   

## 2020-03-17 NOTE — ED Provider Notes (Signed)
[facility] EMERGENCY DEPARTMENT Provider Note   CSN: [id number] Arrival date & time: 03/17/20  1546     History Chief Complaint  Patient presents with  . [place] is a 74 y.o. male.  Patient brought in by EMS.  Patient fell off porch approximately 5 feet.  Patient had bleeding from his left nares.  He was hypotensive at the scene.  Blood pressure was 80/50.  Patient was complaining of rib cage pain and lower back pain.  EMS gave him a 500 cc bolus of fluid and his blood pressure improved to 120/70.  He is in a c-collar.  Patient is a alert but does seem to have some confusion about certain facts.  Patient will follow commands.  Bleeding from the nose has stopped spontaneously.  Patient denies being on blood thinners.  Patient arrived as level 1 trauma but was downgraded to level 2 upon arrival since blood pressure was improved.  Patient apparently fell off the porch headfirst.  No obvious loss of consciousness is.        Past Medical History:  Diagnosis Date  . B12 deficiency    Borderline  . Bulging discs   . Degenerative disc disease, cervical   . Gastritis   . Hyperlipidemia   . Neuropathy   . Parkinson's disease ([place])   . Parkinsonian syndrome ([place])   . Peripheral neuropathy   . TIA (transient ischemic attack)    TIA symptoms from hypotension    Patient Active Problem List   Diagnosis Date Noted  . Lumbar compression fracture ([place]) 10/26/2019  . DDD (degenerative disc disease), cervical 04/07/2016  . Neuropathy   . Bulging discs   . Gastritis   . Parkinsonian syndrome ([place])   . Hyperlipidemia     Past Surgical History:  Procedure Laterality Date  . APPENDECTOMY    . BACK SURGERY         Family History  Problem Relation Age of Onset  . Parkinson's disease Mother   . Parkinson's disease Father   . Diabetes Sister   . Heart disease Brother     Social History   Tobacco Use  . Smoking status: Never Smoker  .  Smokeless tobacco: Never Used  Vaping Use  . Vaping Use: Never used  Substance Use Topics  . Alcohol use: No  . Drug use: No    Home Medications Prior to Admission medications   Medication Sig Start Date End Date Taking? Authorizing Provider  carbidopa-levodopa (SINEMET IR) 25-100 MG tablet Take 2.5 tablets by mouth See admin instructions. Take 2.5 tablets by mouth three times daily- 7 AM, 1 PM, and 7 PM 09/07/15  Yes [provider]  cholecalciferol (VITAMIN D3) 25 MCG (1000 UNIT) tablet Take 2,000 Units by mouth in the morning.    Yes [provider]  Cyanocobalamin (VITAMIN B 12 PO) Take 1 tablet by mouth in the morning.    Yes [provider]  donepezil (ARICEPT) 5 MG tablet Take 5 mg by mouth at bedtime.  04/19/19  Yes [provider]  DULoxetine (CYMBALTA) 60 MG capsule Take 1 capsule (60 mg total) by mouth daily. Patient taking differently: Take 60 mg by mouth at bedtime.  08/11/19  Yes Susy Frizzle, MD  fludrocortisone (FLORINEF) 0.1 MG tablet TAKE 2 TABLETS BY MOUTH DAILY. GENERIC EQUIVALENT FOR FLORINEF. Patient taking differently: Take 0.1 mg by mouth in the morning.  11/21/19  Yes Susy Frizzle, MD  Menthol, Topical Analgesic, (BIOFREEZE) 4 % GEL Apply 1 application topically See admin instructions. Apply topically to painful sites as directed by MD   Yes [provider]  midodrine (PROAMATINE) 10 MG tablet Take 1 tablet (10 mg total) by mouth 3 (three) times daily. Patient taking differently: Take 10 mg by mouth See admin instructions. Take 10 mg by mouth three times a day- 7 AM, 1 PM, and 7 PM 07/29/19  Yes Susy Frizzle, MD  polyethylene glycol powder (GLYCOLAX/MIRALAX) 17 GM/SCOOP powder Take 17 g by mouth See admin instructions. Mix 17 grams of powder into 4-8 ounces of prune juice and drink once a day   Yes [provider]  pravastatin (PRAVACHOL) 20 MG tablet TAKE 1 TABLET BY MOUTH DAILY AT 6 PM GENERIC  EQUIVALENT FOR PRAVACHOL Patient taking differently: Take 20 mg by mouth at bedtime.  10/25/19  Yes Susy Frizzle, MD  Carbidopa-Levodopa ER (SINEMET CR) 25-100 MG tablet controlled release Take 1 tablet by mouth at bedtime. 07/07/15   [provider]  Droxidopa 100 MG CAPS Take 100 mg by mouth 3 (three) times daily. Patient not taking: Reported on 03/17/2020 11/17/19   Susy Frizzle, MD  fluticasone [facility]) 50 MCG/ACT nasal spray Place 2 sprays into both nostrils daily. Patient not taking: Reported on 03/17/2020 01/09/14   Susy Frizzle, MD  ibuprofen (ADVIL) 200 MG tablet Take 200-400 mg by mouth every 6 (six) hours as needed for mild pain or moderate pain.    [provider]  naproxen sodium (ALEVE) 220 MG tablet Take 220 mg by mouth daily as needed. Patient not taking: Reported on 03/17/2020    [provider]  QUEtiapine (SEROQUEL) 50 MG tablet Take 1 tablet (50 mg total) by mouth at bedtime. This is a permanent replacement for the 25 mg dose. Patient not taking: Reported on 03/17/2020 10/31/19   Susy Frizzle, MD  sulfamethoxazole-trimethoprim (BACTRIM DS) 800-160 MG tablet Take 1 tablet by mouth 2 (two) times daily. Patient not taking: Reported on 03/17/2020 10/21/19   Susy Frizzle, MD    Allergies    Tape, Hydrocodone-acetaminophen, Indomethacin, Lyrica [pregabalin], and Quetiapine  Review of Systems   Review of Systems  Unable to perform ROS: Dementia  Musculoskeletal: Positive for back pain.    Physical Exam Updated Vital Signs BP (!) 157/86   Pulse 89   Temp 98.3 F (36.8 C) (Oral)   Resp (!) 21   Ht 1.702 m (5\' 7" )   Wt 59 kg   SpO2 97%   BMI 20.36 kg/m   Physical Exam Vitals and nursing note reviewed.  Constitutional:      Appearance: Normal appearance. He is well-developed.  HENT:     Head: Normocephalic and atraumatic.     Nose:     Comments: Dried blood left nares no active bleeding.  No septal hematoma.  No obvious  deformity. Eyes:     Extraocular Movements: Extraocular movements intact.     Conjunctiva/sclera: Conjunctivae normal.     Pupils: Pupils are equal, round, and reactive to light.  Neck:     Comments: C-collar in place. Cardiovascular:     Rate and Rhythm: Normal rate and regular rhythm.     Heart sounds: No murmur heard.   Pulmonary:     Effort: Pulmonary effort is normal. No respiratory distress.     Breath sounds: Normal breath sounds.     Comments: Tenderness to palpation to bilateral lower rib area.  Questionable abdominal tenderness. Chest:     Chest wall: Tenderness present.  Abdominal:     Palpations: Abdomen is soft.     Tenderness: There is abdominal tenderness.     Comments: Some questionable abdominal tenderness bilateral flank area.  Musculoskeletal:        General: Tenderness present.     Comments: Tenderness to palpation lumbar spine.  Skin:    General: Skin is warm and dry.     Capillary Refill: Capillary refill takes less than 2 seconds.  Neurological:     Mental Status: He is alert. Mental status is at baseline.     ED Results / Procedures / Treatments   Labs (all labs ordered are listed, but only abnormal results are displayed) Labs Reviewed  BASIC METABOLIC PANEL - Abnormal; Notable for the following components:      Result Value   Glucose, Bld 110 (*)    All other components within normal limits  I-STAT CHEM 8, ED - Abnormal; Notable for the following components:   Glucose, Bld 104 (*)    All other components within normal limits  URINE CULTURE  CBC  URINALYSIS, ROUTINE W REFLEX MICROSCOPIC    EKG None  Radiology CT Head Wo Contrast  Result Date: 03/17/2020 CLINICAL DATA:  [place] [address] 5 feet, hypotension EXAM: CT HEAD WITHOUT CONTRAST CT MAXILLOFACIAL WITHOUT CONTRAST CT CERVICAL SPINE WITHOUT CONTRAST TECHNIQUE: Multidetector CT imaging of the head, cervical spine, and maxillofacial structures were performed using the standard protocol without  intravenous contrast. Multiplanar CT image reconstructions of the cervical spine and maxillofacial structures were also generated. COMPARISON:  10/28/2019, 07/26/2019. FINDINGS: CT HEAD FINDINGS Brain: Scattered hypodensities within the periventricular white matter consistent with stable chronic small vessel ischemic change. No signs of acute infarct or hemorrhage. Lateral ventricles and remaining midline structures are unremarkable. No acute extra-axial fluid collections. No mass effect. Vascular: No hyperdense vessel or unexpected calcification. Skull: Normal. Negative for fracture or focal lesion. Other: None. CT MAXILLOFACIAL FINDINGS Osseous: No fracture or mandibular dislocation. No destructive process. Orbits: Negative. No traumatic or inflammatory finding. Sinuses: Clear. Soft tissues: Negative. CT CERVICAL SPINE FINDINGS Alignment: Alignment is anatomic. Skull base and vertebrae: No acute displaced fractures. Soft tissues and spinal canal: No prevertebral fluid or swelling. No visible canal hematoma. Disc levels: Mild spondylosis is seen at the C5/C6 level, with symmetrical neural foraminal encroachment. There is mild right predominant disc osteophyte complex and facet hypertrophy at C3/C4 with right-sided neural foraminal encroachment. Upper chest: Airway is patent. Lung apices are clear. Other: Reconstructed images demonstrate no additional findings. IMPRESSION: 1. Stable chronic small vessel ischemic changes. No acute intracranial process. 2. No acute facial bone fractures. 3. No acute cervical spine fracture. Electronically Signed   By: Randa Ngo M.D.   On: 03/17/2020 16:46   CT Chest W Contrast  Result Date: 03/17/2020 CLINICAL DATA:  Acute pain status post fall. Rib cage pain. Hypotensive. EXAM: CT CHEST, ABDOMEN, AND PELVIS WITH CONTRAST TECHNIQUE: Multidetector CT imaging of the chest, abdomen and pelvis was performed following the standard protocol during bolus administration of  intravenous contrast. CONTRAST:  133mL OMNIPAQUE IOHEXOL 300 MG/ML  SOLN COMPARISON:  10/16/2019 FINDINGS: CT CHEST FINDINGS Cardiovascular: The heart size is normal. There are atherosclerotic changes of the thoracic aorta without evidence for dissection or aneurysm. There is no large centrally located pulmonary embolism. Mediastinum/Nodes: -- No mediastinal lymphadenopathy. -- No hilar lymphadenopathy. -- No axillary lymphadenopathy. -- No supraclavicular lymphadenopathy. -- Normal thyroid gland where visualized. -the  esophagus is dilated and fluid-filled to the upper thorax. Lungs/Pleura: There is a pulmonary nodule in the left upper lobe measuring approximately 3 mm (axial series 6, image 23). There is atelectasis at the lung bases. There is no pneumothorax. Musculoskeletal: No chest wall abnormality. No bony spinal canal stenosis. CT ABDOMEN PELVIS FINDINGS Hepatobiliary: The liver is normal. Normal gallbladder.There is no biliary ductal dilation. Pancreas: Normal contours without ductal dilatation. No peripancreatic fluid collection. Spleen: Unremarkable. Adrenals/Urinary Tract: --Adrenal glands: Unremarkable. --Right kidney/ureter: No hydronephrosis or radiopaque kidney stones. --Left kidney/ureter: No hydronephrosis or radiopaque kidney stones. --Urinary bladder: There is mild diffuse bladder wall thickening. Stomach/Bowel: --Stomach/Duodenum: No hiatal hernia or other gastric abnormality. Normal duodenal course and caliber. --Small bowel: Unremarkable. --Colon: There is a large amount of stool in the colon. --Appendix: Not visualized. No right lower quadrant inflammation or free fluid. Vascular/Lymphatic: Atherosclerotic calcification is present within the non-aneurysmal abdominal aorta, without hemodynamically significant stenosis. --No retroperitoneal lymphadenopathy. --No mesenteric lymphadenopathy. --No pelvic or inguinal lymphadenopathy. Reproductive: Unremarkable Other: No ascites or free air. The  abdominal wall is normal. Musculoskeletal. There is an age-indeterminate severe compression fracture of the L1 vertebral body. There is a questionable mild compression fracture through the superior endplate of the L2 vertebral body. There are acute appearing mild compression fractures of the L3 and L5 vertebral bodies. There is a subtle superior endplate deformity of the T12 vertebral body (coronal series 8, image 63). This is stable since the patient's prior lumbar CT and is favored to represent a normal variant or old compression fracture. IMPRESSION: 1. Acute appearing mild compression fractures of the L3 and L5 vertebral bodies. Chronic L1 vertebral body fracture with progressive height loss since prior study dated 10/16/2019. There is a questionable mild compression fracture through the superior endplate of the L2 vertebral body. Large amount of stool in the colon. 2. Mild diffuse bladder wall thickening. Correlate with urinalysis to exclude cystitis. 3. A 3 mm pulmonary nodule in the left upper lobe. No follow-up needed if patient is low-risk. Non-contrast chest CT can be considered in 12 months if patient is high-risk. This recommendation follows the consensus statement: Guidelines for Management of Incidental Pulmonary Nodules Detected on CT Images: From the Fleischner Society 2017; Radiology 2017; 284:228-243. Aortic Atherosclerosis (ICD10-I70.0). Electronically Signed   By: Constance Holster M.D.   On: 03/17/2020 16:56   CT Cervical Spine Wo Contrast  Result Date: 03/17/2020 CLINICAL DATA:  [place] [address] 5 feet, hypotension EXAM: CT HEAD WITHOUT CONTRAST CT MAXILLOFACIAL WITHOUT CONTRAST CT CERVICAL SPINE WITHOUT CONTRAST TECHNIQUE: Multidetector CT imaging of the head, cervical spine, and maxillofacial structures were performed using the standard protocol without intravenous contrast. Multiplanar CT image reconstructions of the cervical spine and maxillofacial structures were also generated. COMPARISON:   10/28/2019, 07/26/2019. FINDINGS: CT HEAD FINDINGS Brain: Scattered hypodensities within the periventricular white matter consistent with stable chronic small vessel ischemic change. No signs of acute infarct or hemorrhage. Lateral ventricles and remaining midline structures are unremarkable. No acute extra-axial fluid collections. No mass effect. Vascular: No hyperdense vessel or unexpected calcification. Skull: Normal. Negative for fracture or focal lesion. Other: None. CT MAXILLOFACIAL FINDINGS Osseous: No fracture or mandibular dislocation. No destructive process. Orbits: Negative. No traumatic or inflammatory finding. Sinuses: Clear. Soft tissues: Negative. CT CERVICAL SPINE FINDINGS Alignment: Alignment is anatomic. Skull base and vertebrae: No acute displaced fractures. Soft tissues and spinal canal: No prevertebral fluid or swelling. No visible canal hematoma. Disc levels: Mild spondylosis is seen at the C5/C6 level, with  symmetrical neural foraminal encroachment. There is mild right predominant disc osteophyte complex and facet hypertrophy at C3/C4 with right-sided neural foraminal encroachment. Upper chest: Airway is patent. Lung apices are clear. Other: Reconstructed images demonstrate no additional findings. IMPRESSION: 1. Stable chronic small vessel ischemic changes. No acute intracranial process. 2. No acute facial bone fractures. 3. No acute cervical spine fracture. Electronically Signed   By: Randa Ngo M.D.   On: 03/17/2020 16:46   CT Abdomen Pelvis W Contrast  Result Date: 03/17/2020 CLINICAL DATA:  Acute pain status post fall. Rib cage pain. Hypotensive. EXAM: CT CHEST, ABDOMEN, AND PELVIS WITH CONTRAST TECHNIQUE: Multidetector CT imaging of the chest, abdomen and pelvis was performed following the standard protocol during bolus administration of intravenous contrast. CONTRAST:  151mL OMNIPAQUE IOHEXOL 300 MG/ML  SOLN COMPARISON:  10/16/2019 FINDINGS: CT CHEST FINDINGS Cardiovascular: The  heart size is normal. There are atherosclerotic changes of the thoracic aorta without evidence for dissection or aneurysm. There is no large centrally located pulmonary embolism. Mediastinum/Nodes: -- No mediastinal lymphadenopathy. -- No hilar lymphadenopathy. -- No axillary lymphadenopathy. -- No supraclavicular lymphadenopathy. -- Normal thyroid gland where visualized. -the esophagus is dilated and fluid-filled to the upper thorax. Lungs/Pleura: There is a pulmonary nodule in the left upper lobe measuring approximately 3 mm (axial series 6, image 23). There is atelectasis at the lung bases. There is no pneumothorax. Musculoskeletal: No chest wall abnormality. No bony spinal canal stenosis. CT ABDOMEN PELVIS FINDINGS Hepatobiliary: The liver is normal. Normal gallbladder.There is no biliary ductal dilation. Pancreas: Normal contours without ductal dilatation. No peripancreatic fluid collection. Spleen: Unremarkable. Adrenals/Urinary Tract: --Adrenal glands: Unremarkable. --Right kidney/ureter: No hydronephrosis or radiopaque kidney stones. --Left kidney/ureter: No hydronephrosis or radiopaque kidney stones. --Urinary bladder: There is mild diffuse bladder wall thickening. Stomach/Bowel: --Stomach/Duodenum: No hiatal hernia or other gastric abnormality. Normal duodenal course and caliber. --Small bowel: Unremarkable. --Colon: There is a large amount of stool in the colon. --Appendix: Not visualized. No right lower quadrant inflammation or free fluid. Vascular/Lymphatic: Atherosclerotic calcification is present within the non-aneurysmal abdominal aorta, without hemodynamically significant stenosis. --No retroperitoneal lymphadenopathy. --No mesenteric lymphadenopathy. --No pelvic or inguinal lymphadenopathy. Reproductive: Unremarkable Other: No ascites or free air. The abdominal wall is normal. Musculoskeletal. There is an age-indeterminate severe compression fracture of the L1 vertebral body. There is a  questionable mild compression fracture through the superior endplate of the L2 vertebral body. There are acute appearing mild compression fractures of the L3 and L5 vertebral bodies. There is a subtle superior endplate deformity of the T12 vertebral body (coronal series 8, image 63). This is stable since the patient's prior lumbar CT and is favored to represent a normal variant or old compression fracture. IMPRESSION: 1. Acute appearing mild compression fractures of the L3 and L5 vertebral bodies. Chronic L1 vertebral body fracture with progressive height loss since prior study dated 10/16/2019. There is a questionable mild compression fracture through the superior endplate of the L2 vertebral body. Large amount of stool in the colon. 2. Mild diffuse bladder wall thickening. Correlate with urinalysis to exclude cystitis. 3. A 3 mm pulmonary nodule in the left upper lobe. No follow-up needed if patient is low-risk. Non-contrast chest CT can be considered in 12 months if patient is high-risk. This recommendation follows the consensus statement: Guidelines for Management of Incidental Pulmonary Nodules Detected on CT Images: From the Fleischner Society 2017; Radiology 2017; 284:228-243. Aortic Atherosclerosis (ICD10-I70.0). Electronically Signed   By: Constance Holster M.D.   On: 03/17/2020  16:56   DG Pelvis Portable  Result Date: 03/17/2020 CLINICAL DATA:  [place] from porch EXAM: PORTABLE PELVIS 1-2 VIEWS COMPARISON:  10/16/2019 FINDINGS: Supine frontal view of the pelvis was performed. There are no acute displaced fractures. Hips are well aligned. Sacroiliac joints are normal. Chronic spondylosis at the lumbosacral junction unchanged. IMPRESSION: 1. No acute pelvic fracture. Electronically Signed   By: Randa Ngo M.D.   On: 03/17/2020 16:04   DG Chest Port 1 View  Result Date: 03/17/2020 CLINICAL DATA:  Fall from [place]: PORTABLE CHEST 1 VIEW COMPARISON:  August 11, 2016. FINDINGS: The heart size and  mediastinal contours are within normal limits. Both lungs are clear. The visualized skeletal structures are unremarkable. IMPRESSION: No active disease. Electronically Signed   By: Constance Holster M.D.   On: 03/17/2020 16:02   CT Maxillofacial WO CM  Result Date: 03/17/2020 CLINICAL DATA:  [place] [address] 5 feet, hypotension EXAM: CT HEAD WITHOUT CONTRAST CT MAXILLOFACIAL WITHOUT CONTRAST CT CERVICAL SPINE WITHOUT CONTRAST TECHNIQUE: Multidetector CT imaging of the head, cervical spine, and maxillofacial structures were performed using the standard protocol without intravenous contrast. Multiplanar CT image reconstructions of the cervical spine and maxillofacial structures were also generated. COMPARISON:  10/28/2019, 07/26/2019. FINDINGS: CT HEAD FINDINGS Brain: Scattered hypodensities within the periventricular white matter consistent with stable chronic small vessel ischemic change. No signs of acute infarct or hemorrhage. Lateral ventricles and remaining midline structures are unremarkable. No acute extra-axial fluid collections. No mass effect. Vascular: No hyperdense vessel or unexpected calcification. Skull: Normal. Negative for fracture or focal lesion. Other: None. CT MAXILLOFACIAL FINDINGS Osseous: No fracture or mandibular dislocation. No destructive process. Orbits: Negative. No traumatic or inflammatory finding. Sinuses: Clear. Soft tissues: Negative. CT CERVICAL SPINE FINDINGS Alignment: Alignment is anatomic. Skull base and vertebrae: No acute displaced fractures. Soft tissues and spinal canal: No prevertebral fluid or swelling. No visible canal hematoma. Disc levels: Mild spondylosis is seen at the C5/C6 level, with symmetrical neural foraminal encroachment. There is mild right predominant disc osteophyte complex and facet hypertrophy at C3/C4 with right-sided neural foraminal encroachment. Upper chest: Airway is patent. Lung apices are clear. Other: Reconstructed images demonstrate no additional  findings. IMPRESSION: 1. Stable chronic small vessel ischemic changes. No acute intracranial process. 2. No acute facial bone fractures. 3. No acute cervical spine fracture. Electronically Signed   By: Randa Ngo M.D.   On: 03/17/2020 16:46    Procedures Procedures (including critical care time)  CRITICAL CARE Performed by: Fredia Sorrow Total critical care time: 35 minutes Critical care time was exclusive of separately billable procedures and treating other patients. Critical care was necessary to treat or prevent imminent or life-threatening deterioration. Critical care was time spent personally by me on the following activities: development of treatment plan with patient and/or surrogate as well as nursing, discussions with consultants, evaluation of patient's response to treatment, examination of patient, obtaining history from patient or surrogate, ordering and performing treatments and interventions, ordering and review of laboratory studies, ordering and review of radiographic studies, pulse oximetry and re-evaluation of patient's condition.   Medications Ordered in ED Medications  iohexol (OMNIPAQUE) 300 MG/ML solution 100 mL (100 mLs Intravenous Contrast Given 03/17/20 1626)    ED Course  I have reviewed the triage vital signs and the nursing notes.  Pertinent labs & imaging results that were available during my care of the patient were reviewed by me and considered in my medical decision making (see chart for details).    MDM  Rules/Calculators/A&P                          Patient's work-up was significant for probable L2 compression fracture.  And more definite compression fracture of L3 L5.  Patient not able to take pain medication due to the Parkinson's it makes him hallucinate.  Patient's wife not able to lift him.  So he will require admission and some rehabilitation.  Discussed with on-call neurosurgery.  They are recommending a back brace and will consult on the  patient.  Hospitalist will admit.  Patient's work-up otherwise without any acute findings secondary to the fall.  CT head neck face chest abdomen pelvis otherwise negative.  Also chest x-ray and portable pelvic x-ray without any acute findings.  Patient not able to stand secondary to too much pain.  Also has of her amount of pain with movement.  Patient's wife does have some resources at home but if he is unable to stand and walk with his walker she is not can be able to care for him.   Final Clinical Impression(s) / ED Diagnoses Final diagnoses:  Trauma  Fall, initial encounter  Compression fracture of L2 vertebra, initial encounter ([place])  Compression fracture of L3 lumbar vertebra, closed, initial encounter ([place])  Compression fracture of L5 lumbar vertebra, closed, initial encounter [facility])    Rx / DC Orders ED Discharge Orders    None       Fredia Sorrow, MD 03/17/20 1934

## 2020-03-17 NOTE — ED Triage Notes (Signed)
Pt BIB EMS after fall approx 5 feet off of porch onto dirt when bending over to pick up a flower pot. Pt c/o back pain, rib cage pain. Initial BP 80/50. Pt has hx of hypotension and takes medication for same. 400 NS bolus given with improvement to 120/70. A/O x 4, C collar in place.

## 2020-03-17 NOTE — Consult Note (Signed)
Responded to page, met with pt, wife, nurse bedside, provided emotional support and prayer, which pt appreciated. Family is [place]. Pls call again if further need of chaplain services.   Rev. Eloise Levels Chaplain

## 2020-03-17 NOTE — Consult Note (Signed)
Reason for Consult:Compression fracture at L3 and L5 Referring Physician: Dr. Rogene Houston   HPI: Alex Frazier is a 74 y.o. male with medical history significant of Parkinson disease, hyperlipidemia, depression, degenerative disc disease, B12 deficiency, hypertension presents to emergency department for evaluation after falling 5 feet off of his porch onto dirt below. CT chest/abdomen pelvis revealed an acute appearing mild compression fracture at L3 and L5.  Chronic L1 vertebral body fracture with progressive height loss since prior study on 10/16/2019. Due to these findings, the EDP requested neurosurgical input.   Past Medical History:  Diagnosis Date  . B12 deficiency    Borderline  . Bulging discs   . Degenerative disc disease, cervical   . Gastritis   . Hyperlipidemia   . Neuropathy   . Parkinson's disease ([place])   . Parkinsonian syndrome ([place])   . Peripheral neuropathy   . TIA (transient ischemic attack)    TIA symptoms from hypotension    Past Surgical History:  Procedure Laterality Date  . APPENDECTOMY    . BACK SURGERY      Family History  Problem Relation Age of Onset  . Parkinson's disease Mother   . Parkinson's disease Father   . Diabetes Sister   . Heart disease Brother     Social History:  reports that he has never smoked. He has never used smokeless tobacco. He reports that he does not drink alcohol and does not use drugs.  Allergies:  Allergies  Allergen Reactions  . Tape Other (See Comments)    SKIN IS VERY THIN- TEARS VERY EASILY!!  . Hydrocodone-Acetaminophen Other (See Comments)    Hallucinations and confusion  . Indomethacin Other (See Comments)    Unknown reaction  . Lyrica [Pregabalin] Other (See Comments)    Vivid hallucinations  . Quetiapine Other (See Comments)    HYPOtension!!    Medications: I have reviewed the patient's PTA medications  Results for orders placed or performed during the hospital encounter of 03/17/20 (from the past 48  hour(s))  Basic metabolic panel     Status: Abnormal   Collection Time: 03/17/20  4:06 PM  Result Value Ref Range   Sodium 139 135 - 145 mmol/L   Potassium 3.8 3.5 - 5.1 mmol/L   Chloride 104 98 - 111 mmol/L   CO2 27 22 - 32 mmol/L   Glucose, Bld 110 (H) 70 - 99 mg/dL    Comment: Glucose reference range applies only to samples taken after fasting for at least 8 hours.   BUN 18 8 - 23 mg/dL   Creatinine, Ser 0.91 0.61 - 1.24 mg/dL   Calcium 9.3 8.9 - 10.3 mg/dL   GFR calc non Af Amer >60 >60 mL/min   GFR calc Af Amer >60 >60 mL/min   Anion gap 8 5 - 15    Comment: Performed at [facility] [address], [place] [address]  CBC     Status: None   Collection Time: 03/17/20  4:06 PM  Result Value Ref Range   WBC 8.6 4.0 - 10.5 K/uL   RBC 4.66 4.22 - 5.81 MIL/uL   Hemoglobin 13.7 13.0 - 17.0 g/dL   HCT 41.5 39 - 52 %   MCV 89.1 80.0 - 100.0 fL   MCH 29.4 26.0 - 34.0 pg   MCHC 33.0 30.0 - 36.0 g/dL   RDW 13.4 11.5 - 15.5 %   Platelets 242 150 - 400 K/uL   nRBC 0.0 0.0 - 0.2 %  Comment: Performed at [facility], [place] [address] [place], [place] [address]  Magnesium     Status: None   Collection Time: 03/17/20  4:06 PM  Result Value Ref Range   Magnesium 1.9 1.7 - 2.4 mg/dL    Comment: Performed at [facility] [address], [place], [place] [address]  Phosphorus     Status: None   Collection Time: 03/17/20  4:06 PM  Result Value Ref Range   Phosphorus 3.3 2.5 - 4.6 mg/dL    Comment: Performed at [facility] [address] [place], [place] [address]  I-stat chem 8, ED (not at [facility])     Status: Abnormal   Collection Time: 03/17/20  4:08 PM  Result Value Ref Range   Sodium 140 135 - 145 mmol/L   Potassium 3.9 3.5 - 5.1 mmol/L   Chloride 101 98 - 111 mmol/L   BUN 20 8 - 23 mg/dL   Creatinine, Ser 0.90 0.61 - 1.24 mg/dL   Glucose, Bld 104 (H) 70 - 99 mg/dL    Comment: Glucose reference range applies only to samples taken after  fasting for at least 8 hours.   Calcium, Ion 1.18 1.15 - 1.40 mmol/L   TCO2 26 22 - 32 mmol/L   Hemoglobin 13.6 13.0 - 17.0 g/dL   HCT 40.0 39 - 52 %  Urinalysis, Routine w reflex microscopic     Status: Abnormal   Collection Time: 03/17/20  5:31 PM  Result Value Ref Range   Color, Urine AMBER (A) YELLOW    Comment: BIOCHEMICALS MAY BE AFFECTED BY COLOR   APPearance HAZY (A) CLEAR   Specific Gravity, Urine >1.046 (H) 1.005 - 1.030   pH 5.0 5.0 - 8.0   Glucose, UA NEGATIVE NEGATIVE mg/dL   Hgb urine dipstick NEGATIVE NEGATIVE   Bilirubin Urine NEGATIVE NEGATIVE   Ketones, ur 5 (A) NEGATIVE mg/dL   Protein, ur NEGATIVE NEGATIVE mg/dL   Nitrite NEGATIVE NEGATIVE   Leukocytes,Ua NEGATIVE NEGATIVE    Comment: Performed at [facility], [place] [address], [place],  [address]    CT Head Wo Contrast  Result Date: 03/17/2020 CLINICAL DATA:  [place] [address] 5 feet, hypotension EXAM: CT HEAD WITHOUT CONTRAST CT MAXILLOFACIAL WITHOUT CONTRAST CT CERVICAL SPINE WITHOUT CONTRAST TECHNIQUE: Multidetector CT imaging of the head, cervical spine, and maxillofacial structures were performed using the standard protocol without intravenous contrast. Multiplanar CT image reconstructions of the cervical spine and maxillofacial structures were also generated. COMPARISON:  10/28/2019, 07/26/2019. FINDINGS: CT HEAD FINDINGS Brain: Scattered hypodensities within the periventricular white matter consistent with stable chronic small vessel ischemic change. No signs of acute infarct or hemorrhage. Lateral ventricles and remaining midline structures are unremarkable. No acute extra-axial fluid collections. No mass effect. Vascular: No hyperdense vessel or unexpected calcification. Skull: Normal. Negative for fracture or focal lesion. Other: None. CT MAXILLOFACIAL FINDINGS Osseous: No fracture or mandibular dislocation. No destructive process. Orbits: Negative. No traumatic or inflammatory finding. Sinuses: Clear. Soft  tissues: Negative. CT CERVICAL SPINE FINDINGS Alignment: Alignment is anatomic. Skull base and vertebrae: No acute displaced fractures. Soft tissues and spinal canal: No prevertebral fluid or swelling. No visible canal hematoma. Disc levels: Mild spondylosis is seen at the C5/C6 level, with symmetrical neural foraminal encroachment. There is mild right predominant disc osteophyte complex and facet hypertrophy at C3/C4 with right-sided neural foraminal encroachment. Upper chest: Airway is patent. Lung apices are clear. Other: Reconstructed images demonstrate no additional  findings. IMPRESSION: 1. Stable chronic small vessel ischemic changes. No acute intracranial process. 2. No acute facial bone fractures. 3. No acute cervical spine fracture. Electronically Signed   By: Randa Ngo M.D.   On: 03/17/2020 16:46   CT Chest W Contrast  Result Date: 03/17/2020 CLINICAL DATA:  Acute pain status post fall. Rib cage pain. Hypotensive. EXAM: CT CHEST, ABDOMEN, AND PELVIS WITH CONTRAST TECHNIQUE: Multidetector CT imaging of the chest, abdomen and pelvis was performed following the standard protocol during bolus administration of intravenous contrast. CONTRAST:  174mL OMNIPAQUE IOHEXOL 300 MG/ML  SOLN COMPARISON:  10/16/2019 FINDINGS: CT CHEST FINDINGS Cardiovascular: The heart size is normal. There are atherosclerotic changes of the thoracic aorta without evidence for dissection or aneurysm. There is no large centrally located pulmonary embolism. Mediastinum/Nodes: -- No mediastinal lymphadenopathy. -- No hilar lymphadenopathy. -- No axillary lymphadenopathy. -- No supraclavicular lymphadenopathy. -- Normal thyroid gland where visualized. -the esophagus is dilated and fluid-filled to the upper thorax. Lungs/Pleura: There is a pulmonary nodule in the left upper lobe measuring approximately 3 mm (axial series 6, image 23). There is atelectasis at the lung bases. There is no pneumothorax. Musculoskeletal: No chest wall  abnormality. No bony spinal canal stenosis. CT ABDOMEN PELVIS FINDINGS Hepatobiliary: The liver is normal. Normal gallbladder.There is no biliary ductal dilation. Pancreas: Normal contours without ductal dilatation. No peripancreatic fluid collection. Spleen: Unremarkable. Adrenals/Urinary Tract: --Adrenal glands: Unremarkable. --Right kidney/ureter: No hydronephrosis or radiopaque kidney stones. --Left kidney/ureter: No hydronephrosis or radiopaque kidney stones. --Urinary bladder: There is mild diffuse bladder wall thickening. Stomach/Bowel: --Stomach/Duodenum: No hiatal hernia or other gastric abnormality. Normal duodenal course and caliber. --Small bowel: Unremarkable. --Colon: There is a large amount of stool in the colon. --Appendix: Not visualized. No right lower quadrant inflammation or free fluid. Vascular/Lymphatic: Atherosclerotic calcification is present within the non-aneurysmal abdominal aorta, without hemodynamically significant stenosis. --No retroperitoneal lymphadenopathy. --No mesenteric lymphadenopathy. --No pelvic or inguinal lymphadenopathy. Reproductive: Unremarkable Other: No ascites or free air. The abdominal wall is normal. Musculoskeletal. There is an age-indeterminate severe compression fracture of the L1 vertebral body. There is a questionable mild compression fracture through the superior endplate of the L2 vertebral body. There are acute appearing mild compression fractures of the L3 and L5 vertebral bodies. There is a subtle superior endplate deformity of the T12 vertebral body (coronal series 8, image 63). This is stable since the patient's prior lumbar CT and is favored to represent a normal variant or old compression fracture. IMPRESSION: 1. Acute appearing mild compression fractures of the L3 and L5 vertebral bodies. Chronic L1 vertebral body fracture with progressive height loss since prior study dated 10/16/2019. There is a questionable mild compression fracture through the  superior endplate of the L2 vertebral body. Large amount of stool in the colon. 2. Mild diffuse bladder wall thickening. Correlate with urinalysis to exclude cystitis. 3. A 3 mm pulmonary nodule in the left upper lobe. No follow-up needed if patient is low-risk. Non-contrast chest CT can be considered in 12 months if patient is high-risk. This recommendation follows the consensus statement: Guidelines for Management of Incidental Pulmonary Nodules Detected on CT Images: From the Fleischner Society 2017; Radiology 2017; 284:228-243. Aortic Atherosclerosis (ICD10-I70.0). Electronically Signed   By: Constance Holster M.D.   On: 03/17/2020 16:56   CT Cervical Spine Wo Contrast  Result Date: 03/17/2020 CLINICAL DATA:  [place] [address] 5 feet, hypotension EXAM: CT HEAD WITHOUT CONTRAST CT MAXILLOFACIAL WITHOUT CONTRAST CT CERVICAL SPINE WITHOUT CONTRAST TECHNIQUE: Multidetector CT imaging of  the head, cervical spine, and maxillofacial structures were performed using the standard protocol without intravenous contrast. Multiplanar CT image reconstructions of the cervical spine and maxillofacial structures were also generated. COMPARISON:  10/28/2019, 07/26/2019. FINDINGS: CT HEAD FINDINGS Brain: Scattered hypodensities within the periventricular white matter consistent with stable chronic small vessel ischemic change. No signs of acute infarct or hemorrhage. Lateral ventricles and remaining midline structures are unremarkable. No acute extra-axial fluid collections. No mass effect. Vascular: No hyperdense vessel or unexpected calcification. Skull: Normal. Negative for fracture or focal lesion. Other: None. CT MAXILLOFACIAL FINDINGS Osseous: No fracture or mandibular dislocation. No destructive process. Orbits: Negative. No traumatic or inflammatory finding. Sinuses: Clear. Soft tissues: Negative. CT CERVICAL SPINE FINDINGS Alignment: Alignment is anatomic. Skull base and vertebrae: No acute displaced fractures. Soft tissues and  spinal canal: No prevertebral fluid or swelling. No visible canal hematoma. Disc levels: Mild spondylosis is seen at the C5/C6 level, with symmetrical neural foraminal encroachment. There is mild right predominant disc osteophyte complex and facet hypertrophy at C3/C4 with right-sided neural foraminal encroachment. Upper chest: Airway is patent. Lung apices are clear. Other: Reconstructed images demonstrate no additional findings. IMPRESSION: 1. Stable chronic small vessel ischemic changes. No acute intracranial process. 2. No acute facial bone fractures. 3. No acute cervical spine fracture. Electronically Signed   By: Randa Ngo M.D.   On: 03/17/2020 16:46   CT Abdomen Pelvis W Contrast  Result Date: 03/17/2020 CLINICAL DATA:  Acute pain status post fall. Rib cage pain. Hypotensive. EXAM: CT CHEST, ABDOMEN, AND PELVIS WITH CONTRAST TECHNIQUE: Multidetector CT imaging of the chest, abdomen and pelvis was performed following the standard protocol during bolus administration of intravenous contrast. CONTRAST:  188mL OMNIPAQUE IOHEXOL 300 MG/ML  SOLN COMPARISON:  10/16/2019 FINDINGS: CT CHEST FINDINGS Cardiovascular: The heart size is normal. There are atherosclerotic changes of the thoracic aorta without evidence for dissection or aneurysm. There is no large centrally located pulmonary embolism. Mediastinum/Nodes: -- No mediastinal lymphadenopathy. -- No hilar lymphadenopathy. -- No axillary lymphadenopathy. -- No supraclavicular lymphadenopathy. -- Normal thyroid gland where visualized. -the esophagus is dilated and fluid-filled to the upper thorax. Lungs/Pleura: There is a pulmonary nodule in the left upper lobe measuring approximately 3 mm (axial series 6, image 23). There is atelectasis at the lung bases. There is no pneumothorax. Musculoskeletal: No chest wall abnormality. No bony spinal canal stenosis. CT ABDOMEN PELVIS FINDINGS Hepatobiliary: The liver is normal. Normal gallbladder.There is no biliary  ductal dilation. Pancreas: Normal contours without ductal dilatation. No peripancreatic fluid collection. Spleen: Unremarkable. Adrenals/Urinary Tract: --Adrenal glands: Unremarkable. --Right kidney/ureter: No hydronephrosis or radiopaque kidney stones. --Left kidney/ureter: No hydronephrosis or radiopaque kidney stones. --Urinary bladder: There is mild diffuse bladder wall thickening. Stomach/Bowel: --Stomach/Duodenum: No hiatal hernia or other gastric abnormality. Normal duodenal course and caliber. --Small bowel: Unremarkable. --Colon: There is a large amount of stool in the colon. --Appendix: Not visualized. No right lower quadrant inflammation or free fluid. Vascular/Lymphatic: Atherosclerotic calcification is present within the non-aneurysmal abdominal aorta, without hemodynamically significant stenosis. --No retroperitoneal lymphadenopathy. --No mesenteric lymphadenopathy. --No pelvic or inguinal lymphadenopathy. Reproductive: Unremarkable Other: No ascites or free air. The abdominal wall is normal. Musculoskeletal. There is an age-indeterminate severe compression fracture of the L1 vertebral body. There is a questionable mild compression fracture through the superior endplate of the L2 vertebral body. There are acute appearing mild compression fractures of the L3 and L5 vertebral bodies. There is a subtle superior endplate deformity of the T12 vertebral body (coronal series  8, image 63). This is stable since the patient's prior lumbar CT and is favored to represent a normal variant or old compression fracture. IMPRESSION: 1. Acute appearing mild compression fractures of the L3 and L5 vertebral bodies. Chronic L1 vertebral body fracture with progressive height loss since prior study dated 10/16/2019. There is a questionable mild compression fracture through the superior endplate of the L2 vertebral body. Large amount of stool in the colon. 2. Mild diffuse bladder wall thickening. Correlate with urinalysis to  exclude cystitis. 3. A 3 mm pulmonary nodule in the left upper lobe. No follow-up needed if patient is low-risk. Non-contrast chest CT can be considered in 12 months if patient is high-risk. This recommendation follows the consensus statement: Guidelines for Management of Incidental Pulmonary Nodules Detected on CT Images: From the Fleischner Society 2017; Radiology 2017; [id number]. Aortic Atherosclerosis (ICD10-I70.0). Electronically Signed   By: Constance Holster M.D.   On: 03/17/2020 16:56   DG Pelvis Portable  Result Date: 03/17/2020 CLINICAL DATA:  [place] from porch EXAM: PORTABLE PELVIS 1-2 VIEWS COMPARISON:  10/16/2019 FINDINGS: Supine frontal view of the pelvis was performed. There are no acute displaced fractures. Hips are well aligned. Sacroiliac joints are normal. Chronic spondylosis at the lumbosacral junction unchanged. IMPRESSION: 1. No acute pelvic fracture. Electronically Signed   By: Randa Ngo M.D.   On: 03/17/2020 16:04   DG Chest Port 1 View  Result Date: 03/17/2020 CLINICAL DATA:  Fall from [place]: PORTABLE CHEST 1 VIEW COMPARISON:  August 11, 2016. FINDINGS: The heart size and mediastinal contours are within normal limits. Both lungs are clear. The visualized skeletal structures are unremarkable. IMPRESSION: No active disease. Electronically Signed   By: Constance Holster M.D.   On: 03/17/2020 16:02   CT Maxillofacial WO CM  Result Date: 03/17/2020 CLINICAL DATA:  [place] [address] 5 feet, hypotension EXAM: CT HEAD WITHOUT CONTRAST CT MAXILLOFACIAL WITHOUT CONTRAST CT CERVICAL SPINE WITHOUT CONTRAST TECHNIQUE: Multidetector CT imaging of the head, cervical spine, and maxillofacial structures were performed using the standard protocol without intravenous contrast. Multiplanar CT image reconstructions of the cervical spine and maxillofacial structures were also generated. COMPARISON:  10/28/2019, 07/26/2019. FINDINGS: CT HEAD FINDINGS Brain: Scattered hypodensities within the  periventricular white matter consistent with stable chronic small vessel ischemic change. No signs of acute infarct or hemorrhage. Lateral ventricles and remaining midline structures are unremarkable. No acute extra-axial fluid collections. No mass effect. Vascular: No hyperdense vessel or unexpected calcification. Skull: Normal. Negative for fracture or focal lesion. Other: None. CT MAXILLOFACIAL FINDINGS Osseous: No fracture or mandibular dislocation. No destructive process. Orbits: Negative. No traumatic or inflammatory finding. Sinuses: Clear. Soft tissues: Negative. CT CERVICAL SPINE FINDINGS Alignment: Alignment is anatomic. Skull base and vertebrae: No acute displaced fractures. Soft tissues and spinal canal: No prevertebral fluid or swelling. No visible canal hematoma. Disc levels: Mild spondylosis is seen at the C5/C6 level, with symmetrical neural foraminal encroachment. There is mild right predominant disc osteophyte complex and facet hypertrophy at C3/C4 with right-sided neural foraminal encroachment. Upper chest: Airway is patent. Lung apices are clear. Other: Reconstructed images demonstrate no additional findings. IMPRESSION: 1. Stable chronic small vessel ischemic changes. No acute intracranial process. 2. No acute facial bone fractures. 3. No acute cervical spine fracture. Electronically Signed   By: Randa Ngo M.D.   On: 03/17/2020 16:46    Review of Systems - Per HPI   Blood pressure (!) 175/81, pulse 92, temperature 98.3 F (36.8 C), temperature source Oral, resp. rate Marland Kitchen)  24, height 5\' 7"  (1.702 m), weight 59 kg, SpO2 98 %. Physical Exam  Neurologic: CN 2-12 grossly intact. Sensation intact, DTR normal. Strength 5/5 in all 4.   Assessment/Plan: The patient has mild compression fractures at L3 and L5. There is no retropulsion of bone into the canal and the fractures appear stable. There is a chronic L1 vertebral body fracture with progressive height loss since prior study. There  is no neurosurgical intervention needed at this time. I agree with [place] admission for pain control and physical therapy. I would recommend a TLSO brace to help with pain control. Call with any questions.  Peggyann Shoals, MD 03/17/2020, 7:45 PM

## 2020-03-17 NOTE — H&P (Signed)
History and Physical    Alex Frazier YQM:[id number] DOB: [birth year] DOA: 03/17/2020  PCP: Susy Frizzle, MD  Patient coming from: Home  I have personally briefly reviewed patient's old medical records in [place]  Chief Complaint: Fall  HPI: Alex Frazier is a 74 y.o. male with medical history significant of Parkinson disease, hyperlipidemia, depression, degenerative disc disease, B12 deficiency, hypertension presents to emergency department for evaluation of fall.  Patient's wife at bedside is the historian.  She tells me that patient fell approximately 5 feet off of front porch onto dirt when he was trying to pick up a flower pot.  Patient complaining of 9 out of 10 back pain.  EMS was called.  His blood pressure was noted to be 80/50 and was given 400 cc of normal saline bolus his blood pressure improved to 120/70.   Patient denies headache, blurry vision, dizziness, lightheadedness, chest pain, shortness of breath, fever, chills, nausea, vomiting, diarrhea, abdominal pain, seizure, loss of consciousness, urinary, bowel incontinence, slurred speech, facial droop, generalized weakness or lethargy.  He has history of hypotension for which he takes midodrine 3 times daily.  He lives with his wife at home.  No history of smoking, alcohol, illicit drug use.  His wife reports that patient has hallucination whenever he takes pain medicines especially at nighttime.  He takes Tylenol without any issues.  He takes ibuprofen/Aleve only as needed because it makes him agitated especially at nighttime.  ED Course: Upon arrival to ED: Patient's vital signs stable.  His initial labs such as CBC, BMP, UA: Within normal limits.  COVID-19 pending, chest x-ray and pelvic x-ray: Negative for acute fracture.  CT head/cervical spine/maxillofacial spine: Negative for acute findings.  CT chest/abdomen pelvis shows acute appearing mild compression fracture of L3 and L5 vertebral bodies.  Chronic L1  vertebral body fracture with progressive height loss since prior study.  Questionable mild compression fracture through the superior endplate of L2 vertebral body.  EDP consulted neurosurgery.  Patient is not stable to go home.  [facility] hospitalist consulted for admission for pain control and physical therapy. Review of Systems: As per HPI otherwise negative.    Past Medical History:  Diagnosis Date  . B12 deficiency    Borderline  . Bulging discs   . Degenerative disc disease, cervical   . Gastritis   . Hyperlipidemia   . Neuropathy   . Parkinson's disease ([place])   . Parkinsonian syndrome ([place])   . Peripheral neuropathy   . TIA (transient ischemic attack)    TIA symptoms from hypotension    Past Surgical History:  Procedure Laterality Date  . APPENDECTOMY    . BACK SURGERY       reports that he has never smoked. He has never used smokeless tobacco. He reports that he does not drink alcohol and does not use drugs.  Allergies  Allergen Reactions  . Tape Other (See Comments)    SKIN IS VERY THIN- TEARS VERY EASILY!!  . Hydrocodone-Acetaminophen Other (See Comments)    Hallucinations and confusion  . Indomethacin Other (See Comments)    Unknown reaction  . Lyrica [Pregabalin] Other (See Comments)    Vivid hallucinations  . Quetiapine Other (See Comments)    HYPOtension!!    Family History  Problem Relation Age of Onset  . Parkinson's disease Mother   . Parkinson's disease Father   . Diabetes Sister   . Heart disease Brother     Prior to Admission medications  Medication Sig Start Date End Date Taking? Authorizing Provider  carbidopa-levodopa (SINEMET IR) 25-100 MG tablet Take 2.5 tablets by mouth See admin instructions. Take 2.5 tablets by mouth three times daily- 7 AM, 1 PM, and 7 PM 09/07/15  Yes [provider]  cholecalciferol (VITAMIN D3) 25 MCG (1000 UNIT) tablet Take 2,000 Units by mouth in the morning.    Yes [provider]  Cyanocobalamin  (VITAMIN B 12 PO) Take 1 tablet by mouth in the morning.    Yes [provider]  donepezil (ARICEPT) 5 MG tablet Take 5 mg by mouth at bedtime.  04/19/19  Yes [provider]  DULoxetine (CYMBALTA) 60 MG capsule Take 1 capsule (60 mg total) by mouth daily. Patient taking differently: Take 60 mg by mouth at bedtime.  08/11/19  Yes Susy Frizzle, MD  fludrocortisone (FLORINEF) 0.1 MG tablet TAKE 2 TABLETS BY MOUTH DAILY. GENERIC EQUIVALENT FOR FLORINEF. Patient taking differently: Take 0.1 mg by mouth in the morning.  11/21/19  Yes Susy Frizzle, MD  Menthol, Topical Analgesic, (BIOFREEZE) 4 % GEL Apply 1 application topically See admin instructions. Apply topically to painful sites as directed by MD   Yes [provider]  midodrine (PROAMATINE) 10 MG tablet Take 1 tablet (10 mg total) by mouth 3 (three) times daily. Patient taking differently: Take 10 mg by mouth See admin instructions. Take 10 mg by mouth three times a day- 7 AM, 1 PM, and 7 PM 07/29/19  Yes Susy Frizzle, MD  polyethylene glycol powder (GLYCOLAX/MIRALAX) 17 GM/SCOOP powder Take 17 g by mouth See admin instructions. Mix 17 grams of powder into 4-8 ounces of prune juice and drink once a day   Yes [provider]  pravastatin (PRAVACHOL) 20 MG tablet TAKE 1 TABLET BY MOUTH DAILY AT 6 PM GENERIC EQUIVALENT FOR PRAVACHOL Patient taking differently: Take 20 mg by mouth at bedtime.  10/25/19  Yes Susy Frizzle, MD  Carbidopa-Levodopa ER (SINEMET CR) 25-100 MG tablet controlled release Take 1 tablet by mouth at bedtime. 07/07/15   [provider]  Droxidopa 100 MG CAPS Take 100 mg by mouth 3 (three) times daily. Patient not taking: Reported on 03/17/2020 11/17/19   Susy Frizzle, MD  fluticasone [facility]) 50 MCG/ACT nasal spray Place 2 sprays into both nostrils daily. Patient not taking: Reported on 03/17/2020 01/09/14   Susy Frizzle, MD  ibuprofen (ADVIL) 200 MG tablet Take  200-400 mg by mouth every 6 (six) hours as needed for mild pain or moderate pain.    [provider]  naproxen sodium (ALEVE) 220 MG tablet Take 220 mg by mouth daily as needed. Patient not taking: Reported on 03/17/2020    [provider]  QUEtiapine (SEROQUEL) 50 MG tablet Take 1 tablet (50 mg total) by mouth at bedtime. This is a permanent replacement for the 25 mg dose. Patient not taking: Reported on 03/17/2020 10/31/19   Susy Frizzle, MD  sulfamethoxazole-trimethoprim (BACTRIM DS) 800-160 MG tablet Take 1 tablet by mouth 2 (two) times daily. Patient not taking: Reported on 03/17/2020 10/21/19   Susy Frizzle, MD    Physical Exam: Vitals:   03/17/20 1634 03/17/20 1715 03/17/20 1745 03/17/20 1800  BP: (!) 145/68 (!) 157/86 (!) 146/77 135/78  Pulse: 87 89 80 88  Resp: (!) 21 (!) 21 13 20   Temp:      TempSrc:      SpO2: 97% 97% 98% 98%  Weight:  Height:        Constitutional: NAD, calm, comfortable, tearful, masklike facies, monotonic speech. Eyes: PERRL, lids and conjunctivae normal ENMT: Mucous membranes are dry. Posterior pharynx clear of any exudate or lesions.Normal dentition.  Neck: normal, supple, no masses, no thyromegaly Respiratory: clear to auscultation bilaterally, no wheezing, no crackles. Normal respiratory effort. No accessory muscle use.  Cardiovascular: Regular rate and rhythm, no murmurs / rubs / gallops. No extremity edema. 2+ pedal pulses. No carotid bruits.  Abdomen: no tenderness, no masses palpated. No hepatosplenomegaly. Bowel sounds positive.  Musculoskeletal: no clubbing / cyanosis. No joint deformity upper and lower extremities. Good ROM, no contractures. Normal muscle tone.  Skin: no rashes, lesions, ulcers. No induration Neurologic: CN 2-12 grossly intact. Sensation intact, DTR normal. Strength 5/5 in all 4.    Labs on Admission: I have personally reviewed following labs and imaging studies  CBC: Recent Labs  Lab  03/17/20 1606 03/17/20 1608  WBC 8.6  --   HGB 13.7 13.6  HCT 41.5 40.0  MCV 89.1  --   PLT 242  --    Basic Metabolic Panel: Recent Labs  Lab 03/17/20 1606 03/17/20 1608  NA 139 140  K 3.8 3.9  CL 104 101  CO2 27  --   GLUCOSE 110* 104*  BUN 18 20  CREATININE 0.91 0.90  CALCIUM 9.3  --    GFR: Estimated Creatinine Clearance: 61 mL/min (by C-G formula based on SCr of 0.9 mg/dL). Liver Function Tests: No results for input(s): AST, ALT, ALKPHOS, BILITOT, PROT, ALBUMIN in the last 168 hours. No results for input(s): LIPASE, AMYLASE in the last 168 hours. No results for input(s): AMMONIA in the last 168 hours. Coagulation Profile: No results for input(s): INR, PROTIME in the last 168 hours. Cardiac Enzymes: No results for input(s): CKTOTAL, CKMB, CKMBINDEX, TROPONINI in the last 168 hours. BNP (last 3 results) No results for input(s): PROBNP in the last 8760 hours. HbA1C: No results for input(s): HGBA1C in the last 72 hours. CBG: No results for input(s): GLUCAP in the last 168 hours. Lipid Profile: No results for input(s): CHOL, HDL, LDLCALC, TRIG, CHOLHDL, LDLDIRECT in the last 72 hours. Thyroid Function Tests: No results for input(s): TSH, T4TOTAL, FREET4, T3FREE, THYROIDAB in the last 72 hours. Anemia Panel: No results for input(s): VITAMINB12, FOLATE, FERRITIN, TIBC, IRON, RETICCTPCT in the last 72 hours. Urine analysis:    Component Value Date/Time   COLORURINE AMBER (A) 03/17/2020 1731   APPEARANCEUR HAZY (A) 03/17/2020 1731   LABSPEC >1.046 (H) 03/17/2020 1731   PHURINE 5.0 03/17/2020 1731   GLUCOSEU NEGATIVE 03/17/2020 1731   HGBUR NEGATIVE 03/17/2020 1731   BILIRUBINUR NEGATIVE 03/17/2020 1731   KETONESUR 5 (A) 03/17/2020 1731   PROTEINUR NEGATIVE 03/17/2020 1731   UROBILINOGEN 0.2 11/06/2008 1555   NITRITE NEGATIVE 03/17/2020 1731   LEUKOCYTESUR NEGATIVE 03/17/2020 1731    Radiological Exams on Admission: CT Head Wo Contrast  Result Date:  03/17/2020 CLINICAL DATA:  [address] [address] 5 feet, hypotension EXAM: CT HEAD WITHOUT CONTRAST CT MAXILLOFACIAL WITHOUT CONTRAST CT CERVICAL SPINE WITHOUT CONTRAST TECHNIQUE: Multidetector CT imaging of the head, cervical spine, and maxillofacial structures were performed using the standard protocol without intravenous contrast. Multiplanar CT image reconstructions of the cervical spine and maxillofacial structures were also generated. COMPARISON:  10/28/2019, 07/26/2019. FINDINGS: CT HEAD FINDINGS Brain: Scattered hypodensities within the periventricular white matter consistent with stable chronic small vessel ischemic change. No signs of acute infarct or hemorrhage. Lateral ventricles and remaining midline  structures are unremarkable. No acute extra-axial fluid collections. No mass effect. Vascular: No hyperdense vessel or unexpected calcification. Skull: Normal. Negative for fracture or focal lesion. Other: None. CT MAXILLOFACIAL FINDINGS Osseous: No fracture or mandibular dislocation. No destructive process. Orbits: Negative. No traumatic or inflammatory finding. Sinuses: Clear. Soft tissues: Negative. CT CERVICAL SPINE FINDINGS Alignment: Alignment is anatomic. Skull base and vertebrae: No acute displaced fractures. Soft tissues and spinal canal: No prevertebral fluid or swelling. No visible canal hematoma. Disc levels: Mild spondylosis is seen at the C5/C6 level, with symmetrical neural foraminal encroachment. There is mild right predominant disc osteophyte complex and facet hypertrophy at C3/C4 with right-sided neural foraminal encroachment. Upper chest: Airway is patent. Lung apices are clear. Other: Reconstructed images demonstrate no additional findings. IMPRESSION: 1. Stable chronic small vessel ischemic changes. No acute intracranial process. 2. No acute facial bone fractures. 3. No acute cervical spine fracture. Electronically Signed   By: Randa Ngo M.D.   On: 03/17/2020 16:46   CT Chest W Contrast  Result  Date: 03/17/2020 CLINICAL DATA:  Acute pain status post fall. Rib cage pain. Hypotensive. EXAM: CT CHEST, ABDOMEN, AND PELVIS WITH CONTRAST TECHNIQUE: Multidetector CT imaging of the chest, abdomen and pelvis was performed following the standard protocol during bolus administration of intravenous contrast. CONTRAST:  139mL OMNIPAQUE IOHEXOL 300 MG/ML  SOLN COMPARISON:  10/16/2019 FINDINGS: CT CHEST FINDINGS Cardiovascular: The heart size is normal. There are atherosclerotic changes of the thoracic aorta without evidence for dissection or aneurysm. There is no large centrally located pulmonary embolism. Mediastinum/Nodes: -- No mediastinal lymphadenopathy. -- No hilar lymphadenopathy. -- No axillary lymphadenopathy. -- No supraclavicular lymphadenopathy. -- Normal thyroid gland where visualized. -the esophagus is dilated and fluid-filled to the upper thorax. Lungs/Pleura: There is a pulmonary nodule in the left upper lobe measuring approximately 3 mm (axial series 6, image 23). There is atelectasis at the lung bases. There is no pneumothorax. Musculoskeletal: No chest wall abnormality. No bony spinal canal stenosis. CT ABDOMEN PELVIS FINDINGS Hepatobiliary: The liver is normal. Normal gallbladder.There is no biliary ductal dilation. Pancreas: Normal contours without ductal dilatation. No peripancreatic fluid collection. Spleen: Unremarkable. Adrenals/Urinary Tract: --Adrenal glands: Unremarkable. --Right kidney/ureter: No hydronephrosis or radiopaque kidney stones. --Left kidney/ureter: No hydronephrosis or radiopaque kidney stones. --Urinary bladder: There is mild diffuse bladder wall thickening. Stomach/Bowel: --Stomach/Duodenum: No hiatal hernia or other gastric abnormality. Normal duodenal course and caliber. --Small bowel: Unremarkable. --Colon: There is a large amount of stool in the colon. --Appendix: Not visualized. No right lower quadrant inflammation or free fluid. Vascular/Lymphatic: Atherosclerotic  calcification is present within the non-aneurysmal abdominal aorta, without hemodynamically significant stenosis. --No retroperitoneal lymphadenopathy. --No mesenteric lymphadenopathy. --No pelvic or inguinal lymphadenopathy. Reproductive: Unremarkable Other: No ascites or free air. The abdominal wall is normal. Musculoskeletal. There is an age-indeterminate severe compression fracture of the L1 vertebral body. There is a questionable mild compression fracture through the superior endplate of the L2 vertebral body. There are acute appearing mild compression fractures of the L3 and L5 vertebral bodies. There is a subtle superior endplate deformity of the T12 vertebral body (coronal series 8, image 63). This is stable since the patient's prior lumbar CT and is favored to represent a normal variant or old compression fracture. IMPRESSION: 1. Acute appearing mild compression fractures of the L3 and L5 vertebral bodies. Chronic L1 vertebral body fracture with progressive height loss since prior study dated 10/16/2019. There is a questionable mild compression fracture through the superior endplate of the L2 vertebral  body. Large amount of stool in the colon. 2. Mild diffuse bladder wall thickening. Correlate with urinalysis to exclude cystitis. 3. A 3 mm pulmonary nodule in the left upper lobe. No follow-up needed if patient is low-risk. Non-contrast chest CT can be considered in 12 months if patient is high-risk. This recommendation follows the consensus statement: Guidelines for Management of Incidental Pulmonary Nodules Detected on CT Images: From the Fleischner Society 2017; Radiology 2017; [id number]:[id number]. Aortic Atherosclerosis (ICD10-I70.0). Electronically Signed   By: Constance Holster M.D.   On: 03/17/2020 16:56   CT Cervical Spine Wo Contrast  Result Date: 03/17/2020 CLINICAL DATA:  [address] 5 feet, hypotension EXAM: CT HEAD WITHOUT CONTRAST CT MAXILLOFACIAL WITHOUT CONTRAST CT CERVICAL SPINE WITHOUT CONTRAST  TECHNIQUE: Multidetector CT imaging of the head, cervical spine, and maxillofacial structures were performed using the standard protocol without intravenous contrast. Multiplanar CT image reconstructions of the cervical spine and maxillofacial structures were also generated. COMPARISON:  10/28/2019, 07/26/2019. FINDINGS: CT HEAD FINDINGS Brain: Scattered hypodensities within the periventricular white matter consistent with stable chronic small vessel ischemic change. No signs of acute infarct or hemorrhage. Lateral ventricles and remaining midline structures are unremarkable. No acute extra-axial fluid collections. No mass effect. Vascular: No hyperdense vessel or unexpected calcification. Skull: Normal. Negative for fracture or focal lesion. Other: None. CT MAXILLOFACIAL FINDINGS Osseous: No fracture or mandibular dislocation. No destructive process. Orbits: Negative. No traumatic or inflammatory finding. Sinuses: Clear. Soft tissues: Negative. CT CERVICAL SPINE FINDINGS Alignment: Alignment is anatomic. Skull base and vertebrae: No acute displaced fractures. Soft tissues and spinal canal: No prevertebral fluid or swelling. No visible canal hematoma. Disc levels: Mild spondylosis is seen at the C5/C6 level, with symmetrical neural foraminal encroachment. There is mild right predominant disc osteophyte complex and facet hypertrophy at C3/C4 with right-sided neural foraminal encroachment. Upper chest: Airway is patent. Lung apices are clear. Other: Reconstructed images demonstrate no additional findings. IMPRESSION: 1. Stable chronic small vessel ischemic changes. No acute intracranial process. 2. No acute facial bone fractures. 3. No acute cervical spine fracture. Electronically Signed   By: Randa Ngo M.D.   On: 03/17/2020 16:46   CT Abdomen Pelvis W Contrast  Result Date: 03/17/2020 CLINICAL DATA:  Acute pain status post fall. Rib cage pain. Hypotensive. EXAM: CT CHEST, ABDOMEN, AND PELVIS WITH CONTRAST  TECHNIQUE: Multidetector CT imaging of the chest, abdomen and pelvis was performed following the standard protocol during bolus administration of intravenous contrast. CONTRAST:  1106mL OMNIPAQUE IOHEXOL 300 MG/ML  SOLN COMPARISON:  10/16/2019 FINDINGS: CT CHEST FINDINGS Cardiovascular: The heart size is normal. There are atherosclerotic changes of the thoracic aorta without evidence for dissection or aneurysm. There is no large centrally located pulmonary embolism. Mediastinum/Nodes: -- No mediastinal lymphadenopathy. -- No hilar lymphadenopathy. -- No axillary lymphadenopathy. -- No supraclavicular lymphadenopathy. -- Normal thyroid gland where visualized. -the esophagus is dilated and fluid-filled to the upper thorax. Lungs/Pleura: There is a pulmonary nodule in the left upper lobe measuring approximately 3 mm (axial series 6, image 23). There is atelectasis at the lung bases. There is no pneumothorax. Musculoskeletal: No chest wall abnormality. No bony spinal canal stenosis. CT ABDOMEN PELVIS FINDINGS Hepatobiliary: The liver is normal. Normal gallbladder.There is no biliary ductal dilation. Pancreas: Normal contours without ductal dilatation. No peripancreatic fluid collection. Spleen: Unremarkable. Adrenals/Urinary Tract: --Adrenal glands: Unremarkable. --Right kidney/ureter: No hydronephrosis or radiopaque kidney stones. --Left kidney/ureter: No hydronephrosis or radiopaque kidney stones. --Urinary bladder: There is mild diffuse bladder wall thickening. Stomach/Bowel: --Stomach/Duodenum: No hiatal  hernia or other gastric abnormality. Normal duodenal course and caliber. --Small bowel: Unremarkable. --Colon: There is a large amount of stool in the colon. --Appendix: Not visualized. No right lower quadrant inflammation or free fluid. Vascular/Lymphatic: Atherosclerotic calcification is present within the non-aneurysmal abdominal aorta, without hemodynamically significant stenosis. --No retroperitoneal  lymphadenopathy. --No mesenteric lymphadenopathy. --No pelvic or inguinal lymphadenopathy. Reproductive: Unremarkable Other: No ascites or free air. The abdominal wall is normal. Musculoskeletal. There is an age-indeterminate severe compression fracture of the L1 vertebral body. There is a questionable mild compression fracture through the superior endplate of the L2 vertebral body. There are acute appearing mild compression fractures of the L3 and L5 vertebral bodies. There is a subtle superior endplate deformity of the T12 vertebral body (coronal series 8, image 63). This is stable since the patient's prior lumbar CT and is favored to represent a normal variant or old compression fracture. IMPRESSION: 1. Acute appearing mild compression fractures of the L3 and L5 vertebral bodies. Chronic L1 vertebral body fracture with progressive height loss since prior study dated 10/16/2019. There is a questionable mild compression fracture through the superior endplate of the L2 vertebral body. Large amount of stool in the colon. 2. Mild diffuse bladder wall thickening. Correlate with urinalysis to exclude cystitis. 3. A 3 mm pulmonary nodule in the left upper lobe. No follow-up needed if patient is low-risk. Non-contrast chest CT can be considered in 12 months if patient is high-risk. This recommendation follows the consensus statement: Guidelines for Management of Incidental Pulmonary Nodules Detected on CT Images: From the Fleischner Society 2017; Radiology 2017; [id number]. Aortic Atherosclerosis (ICD10-I70.0). Electronically Signed   By: Constance Holster M.D.   On: 03/17/2020 16:56   DG Pelvis Portable  Result Date: 03/17/2020 CLINICAL DATA:  [address] [address] from porch EXAM: PORTABLE PELVIS 1-2 VIEWS COMPARISON:  10/16/2019 FINDINGS: Supine frontal view of the pelvis was performed. There are no acute displaced fractures. Hips are well aligned. Sacroiliac joints are normal. Chronic spondylosis at the lumbosacral junction  unchanged. IMPRESSION: 1. No acute pelvic fracture. Electronically Signed   By: Randa Ngo M.D.   On: 03/17/2020 16:04   DG Chest Port 1 View  Result Date: 03/17/2020 CLINICAL DATA:  Fall from [place]: PORTABLE CHEST 1 VIEW COMPARISON:  August 11, 2016. FINDINGS: The heart size and mediastinal contours are within normal limits. Both lungs are clear. The visualized skeletal structures are unremarkable. IMPRESSION: No active disease. Electronically Signed   By: Constance Holster M.D.   On: 03/17/2020 16:02   CT Maxillofacial WO CM  Result Date: 03/17/2020 CLINICAL DATA:  [address] 5 feet, hypotension EXAM: CT HEAD WITHOUT CONTRAST CT MAXILLOFACIAL WITHOUT CONTRAST CT CERVICAL SPINE WITHOUT CONTRAST TECHNIQUE: Multidetector CT imaging of the head, cervical spine, and maxillofacial structures were performed using the standard protocol without intravenous contrast. Multiplanar CT image reconstructions of the cervical spine and maxillofacial structures were also generated. COMPARISON:  10/28/2019, 07/26/2019. FINDINGS: CT HEAD FINDINGS Brain: Scattered hypodensities within the periventricular white matter consistent with stable chronic small vessel ischemic change. No signs of acute infarct or hemorrhage. Lateral ventricles and remaining midline structures are unremarkable. No acute extra-axial fluid collections. No mass effect. Vascular: No hyperdense vessel or unexpected calcification. Skull: Normal. Negative for fracture or focal lesion. Other: None. CT MAXILLOFACIAL FINDINGS Osseous: No fracture or mandibular dislocation. No destructive process. Orbits: Negative. No traumatic or inflammatory finding. Sinuses: Clear. Soft tissues: Negative. CT CERVICAL SPINE FINDINGS Alignment: Alignment is anatomic. Skull base and vertebrae: No acute displaced  fractures. Soft tissues and spinal canal: No prevertebral fluid or swelling. No visible canal hematoma. Disc levels: Mild spondylosis is seen at the C5/C6 level,  with symmetrical neural foraminal encroachment. There is mild right predominant disc osteophyte complex and facet hypertrophy at C3/C4 with right-sided neural foraminal encroachment. Upper chest: Airway is patent. Lung apices are clear. Other: Reconstructed images demonstrate no additional findings. IMPRESSION: 1. Stable chronic small vessel ischemic changes. No acute intracranial process. 2. No acute facial bone fractures. 3. No acute cervical spine fracture. Electronically Signed   By: Randa Ngo M.D.   On: 03/17/2020 16:46    Assessment/Plan Principal Problem:   Lumbar compression fracture (HCC) Active Problems:   Parkinson's disease (HCC)   Hyperlipidemia   Pulmonary nodule    Acute compression fracture of L2, L3 and L5: -Status post fall.  Reviewed CT abdomen/chest result. -Patient's vital signs are currently stable.  He is afebrile.  COVID-19 pending.  Initial labs such as CBC, BMP: WNL, UA negative for infection. -Admit patient on the floor. -EDP consulted neurosurgery-await recommendations -Patient has history of hallucination with narcotics/opioids.  -Tylenol/ibuprofen/Toradol as needed for pain control.  No pain medication at nighttime to avoid hallucination. -Consult PT/OT -On fall precautions  Parkinson disease: Continue Sinemet, Aricept  Hypotension: Currently patient's blood pressure is elevated likely secondary to pain -We will hold midodrine for now.  Monitor blood pressure closely  Hyperlipidemia: Continue statin  Pulmonary nodule: Noted on CT chest. -3 mm in size. -Outpatient follow-up  Chronic constipation: Continue MiraLAX  Depression/anxiety: Continue Cymbalta  Bladder wall thickening: -UA negative for infection.  Patient has no urinary symptoms -He is afebrile with no leukocytosis.  Will hold off antibiotics for now.  DVT prophylaxis: Lovenox/SCD Code Status: Full code-confirmed with the patient wife Family Communication: Patient's wife present at  bedside.  Plan of care discussed with patient in length and he verbalized understanding and agreed with it. Disposition Plan: Likely home in 2 to 3 days Consults called: Neurosurgery by EDP Admission status: Inpatient   Mckinley Jewel MD [facility]  If 7PM-7AM, please contact night-coverage [email or web address] Password [facility]  03/17/2020, 6:16 PM

## 2020-03-17 NOTE — Progress Notes (Signed)
Orthopedic Tech Progress Note Patient Details:  Alex Frazier 09/05/1946 [id number] LEVEL 1 TRAUMA that was downgraded to leve; 2 Patient ID: [name], male   DOB: 04/29/1946, 74 y.o.   MRN: [id number]   Janit Pagan 03/17/2020, 4:13 PM

## 2020-03-18 ENCOUNTER — Encounter (HOSPITAL_COMMUNITY): Payer: Self-pay | Admitting: Internal Medicine

## 2020-03-18 LAB — URINE CULTURE: Culture: <10000 — AB

## 2020-03-18 LAB — CBC
HCT: 37.8 % — ABNORMAL LOW (ref 39.0–52.0)
Hemoglobin: 12.3 g/dL — ABNORMAL LOW (ref 13.0–17.0)
MCH: 28.8 pg (ref 26.0–34.0)
MCHC: 32.5 g/dL (ref 30.0–36.0)
MCV: 88.5 fL (ref 80.0–100.0)
Platelets: 182 10*3/uL (ref 150–400)
RBC: 4.27 MIL/uL (ref 4.22–5.81)
RDW: 13.6 % (ref 11.5–15.5)
WBC: 7.4 10*3/uL (ref 4.0–10.5)
nRBC: 0 % (ref 0.0–0.2)

## 2020-03-18 LAB — COMPREHENSIVE METABOLIC PANEL
ALT: 10 U/L (ref 0–44)
AST: 33 U/L (ref 15–41)
Albumin: 3.3 g/dL — ABNORMAL LOW (ref 3.5–5.0)
Alkaline Phosphatase: 96 U/L (ref 38–126)
Anion gap: 7 (ref 5–15)
BUN: 18 mg/dL (ref 8–23)
CO2: 28 mmol/L (ref 22–32)
Calcium: 8.9 mg/dL (ref 8.9–10.3)
Chloride: 102 mmol/L (ref 98–111)
Creatinine, Ser: 0.8 mg/dL (ref 0.61–1.24)
GFR calc Af Amer: >60 mL/min (ref >60)
GFR calc non Af Amer: >60 mL/min (ref >60)
Glucose, Bld: 116 mg/dL — ABNORMAL HIGH (ref 70–99)
Potassium: 4.1 mmol/L (ref 3.5–5.1)
Sodium: 137 mmol/L (ref 135–145)
Total Bilirubin: 0.4 mg/dL (ref 0.3–1.2)
Total Protein: 5.9 g/dL — ABNORMAL LOW (ref 6.5–8.1)

## 2020-03-18 MED ORDER — ACETAMINOPHEN 500 MG PO TABS
1000.0000 mg | ORAL_TABLET | Freq: Three times a day (TID) | ORAL | Status: DC
  Administered 2020-03-18 – 2020-03-22 (×12): 1000 mg via ORAL
  Filled 2020-03-18 (×12): qty 2

## 2020-03-18 MED ORDER — OXYCODONE HCL 5 MG PO TABS
2.5000 mg | ORAL_TABLET | Freq: Four times a day (QID) | ORAL | Status: DC | PRN
  Administered 2020-03-19 – 2020-03-20 (×3): 2.5 mg via ORAL
  Filled 2020-03-18 (×3): qty 1

## 2020-03-18 MED ORDER — CALCITONIN (SALMON) 200 UNIT/ACT NA SOLN
1.0000 | Freq: Every day | NASAL | Status: DC
  Administered 2020-03-18 – 2020-03-22 (×6): 1 via NASAL
  Filled 2020-03-18: qty 3.7

## 2020-03-18 MED ORDER — IBUPROFEN 200 MG PO TABS
600.0000 mg | ORAL_TABLET | Freq: Three times a day (TID) | ORAL | Status: DC | PRN
  Filled 2020-03-18: qty 3

## 2020-03-18 NOTE — Progress Notes (Signed)
Occupational Therapy Evaluation Patient Details Name: Alex Frazier MRN: [id number] DOB: 03-27-1946 Today's Date: 03/18/2020    History of Present Illness  Alex Frazier is a 74 y.o. male with medical history significant of Parkinson's disease, hyperlipidemia, depression, degenerative disc disease, B12 deficiency, hypertension presents to emergency department for evaluation after falling 5 feet off of his porch onto dirt below. CT chest/abdomen pelvis revealed an acute appearing mild compression fracture at L3 and L5.  Chronic L1 vertebral body fracture with progressive height loss since prior study on 10/16/2019.    Clinical Impression   Pt admitted for above and limited by increased pain, decreased balance, decreased strength/ROM, decreased activity tolerance/endurance. According to wife at bedside, pt requires min-mod assist with most self-care activities and utilizes a rollator to ambulate household distances. Pt wears depends and requires 24/7 S for safety secondary to imbalance and incoordination. Pt has a hx of falls. Pt is dependent on spouse and daughter for IADLs.  Pt received semi-reclined in bed, spouse present at bedside, pt agreeable to OT eval. Extended time taken to gather PLOF. Pt c/o 5/10 pain in lower back. At time of eval, OT waiting for MD clearance on spinal precautions, no TLSO present. Bed eval performed and education given to pt/spouse about medical reasoning for using TLSO during functional movement/ADLs and reviewed spinal precautions (including log rolling). Additionally nursing present to fix condom catheter. OT returned to finish OT eval/tx after getting spinal clearance from MD shortly after initial encounter. See treatment note for additional details regarding pt's functional status. Pt would benefit from continued skilled acute OT services to discuss TLSO management, perform OOB ADLs/functional transfers, and discuss potential DME/AE for home.      Follow Up  Recommendations  CIR;Supervision/Assistance - 24 hour    Equipment Recommendations  Other (comment) (portable bed railing)    Recommendations for Other Services  None.     Precautions / Restrictions Precautions Precautions: Back Precaution Booklet Issued: No Precaution Comments: stable spinal fractures, ok to mobilize without brace per MD Required Braces or Orthoses: Spinal Brace Spinal Brace: Thoracolumbosacral orthotic Restrictions Weight Bearing Restrictions: No Other Position/Activity Restrictions: general spinal precautions      Mobility Bed Mobility Overal bed mobility: Needs Assistance Bed Mobility: Rolling;Sidelying to Sit;Sit to Sidelying Rolling: Mod assist;+2 for physical assistance Sidelying to sit: Max assist     Sit to sidelying: Max assist General bed mobility comments: bed flat for mobility to protect spine and teach technique; pt limited by pain and mild fear/avoidance behaviors noted; stiffness in trunk due to PD exacerbated by pain  Transfers Overall transfer level: Needs assistance Equipment used: Rolling walker (2 wheeled) Transfers: Sit to/from Stand Sit to Stand: Mod assist;+2 safety/equipment;From elevated surface (bed simulates home height)         General transfer comment: pt declined stand secondary to fatigue and pain    Balance Overall balance assessment: Mild deficits observed, not formally tested           ADL either performed or assessed with clinical judgement   ADL Overall ADL's : Needs assistance/impaired Eating/Feeding: Set up;Bed level   Grooming: Wash/dry hands;Wash/dry face;Set up;Sitting Grooming Details (indicate cue type and reason): pt washed face sitting EOB with CGA for trunk support Upper Body Bathing: Maximal assistance;Sitting   Lower Body Bathing: Maximal assistance;Sitting/lateral leans;Sit to/from stand   Upper Body Dressing : Moderate assistance;Sitting   Lower Body Dressing: Maximal  assistance;Sitting/lateral leans;Sit to/from stand   Toilet Transfer: Moderate assistance;Stand-pivot;BSC;RW   Toileting-  Clothing Manipulation and Hygiene: Total assistance;Bed level Toileting - Clothing Manipulation Details (indicate cue type and reason): pt using foley cath; 2 spillages, nursing notified to repair     Functional mobility during ADLs: Moderate assistance;Minimal assistance General ADL Comments: pt transitioned to EOB with mod-max assist to participate in grooming task, pt declined standing secondary to pain, pt required mod assist for transition to EOB and min assist-CGA intermittendly for trunk support secondary to R lateral lean     Vision Baseline Vision/History: Wears glasses Wears Glasses: Reading only Patient Visual Report: No change from baseline              Pertinent Vitals/Pain Pain Assessment: 0-10 Pain Score: 5  Faces Pain Scale: Hurts whole lot Pain Location: back Pain Descriptors / Indicators: Grimacing;Guarding;Moaning Pain Intervention(s): Limited activity within patient's tolerance;Monitored during session;Repositioned;Other (comment) (educated on log rolling)     Hand Dominance Right   Extremity/Trunk Assessment Upper Extremity Assessment Upper Extremity Assessment: Generalized weakness   Lower Extremity Assessment Lower Extremity Assessment: Defer to PT evaluation   Cervical / Trunk Assessment Cervical / Trunk Assessment: Other exceptions Cervical / Trunk Exceptions: flexed consistent with Parkinson's Disease   Communication Communication Communication: HOH   Cognition Arousal/Alertness: Awake/alert Behavior During Therapy: WFL for tasks assessed/performed Overall Cognitive Status: Within Functional Limits for tasks assessed         General Comments: cooperative   General Comments  maintained normal vitals throughout OT eval/tx               Home Living Family/patient expects to be discharged to:: Private  residence Living Arrangements: Spouse/significant other Available Help at Discharge: Family;Available 24 hours/day Type of Home: House Home Access: Ramped entrance     Home Layout: One level     Bathroom Shower/Tub: Teacher, early years/pre: Handicapped height (BSC placed over toilet) Bathroom Accessibility: Yes How Accessible: Accessible via walker Home Equipment: [place] - 2 wheels;Walker - 4 wheels;Shower seat;Bedside commode;Grab bars - toilet;Grab bars - tub/shower;Wheelchair - manual   Additional Comments: pt has hx parkinsons, spouse is dedicated caregiver      Prior Functioning/Environment Level of Independence: Needs assistance  Gait / Transfers Assistance Needed: supervision to minimal assist, uses rollator, wife says biggest challenge is getting in/out of bed as pt wants to sit up vs. roll ADL's / Homemaking Assistance Needed: wife reports that pt requires min-mod assist with dressing/bathing/toileting, Mod I for grooming, most difficulty reaching LB Communication / Swallowing Assistance Needed: None Comments: dependent on wife for IADLs        OT Problem List: Decreased strength;Decreased range of motion;Decreased activity tolerance;Impaired balance (sitting and/or standing);Decreased knowledge of precautions;Pain      OT Treatment/Interventions: Self-care/ADL training;Therapeutic exercise;Energy conservation;DME and/or AE instruction;Therapeutic activities;Patient/family education    OT Goals(Current goals can be found in the care plan section) Acute Rehab OT Goals Patient Stated Goal: home, min assist or better (wife) OT Goal Formulation: With patient/family Time For Goal Achievement: 04/01/20 Potential to Achieve Goals: Good  OT Frequency: Min 2X/week    AM-PAC OT "6 Clicks" Daily Activity     Outcome Measure Help from another person eating meals?: None Help from another person taking care of personal grooming?: A Little Help from another person  toileting, which includes using toliet, bedpan, or urinal?: Total Help from another person bathing (including washing, rinsing, drying)?: A Lot Help from another person to put on and taking off regular upper body clothing?: A Lot Help from another person to  put on and taking off regular lower body clothing?: Total 6 Click Score: 13   End of Session Equipment Utilized During Treatment: Gait belt Nurse Communication: Mobility status  Activity Tolerance: Patient limited by fatigue;Patient limited by pain Patient left: in bed;with call bell/phone within reach;with bed alarm set;with nursing/sitter in room;with family/visitor present  OT Visit Diagnosis: Repeated falls (R29.6);Muscle weakness (generalized) (M62.81);Pain Pain - Right/Left:  (both) Pain - part of body:  (back)                Time: 7092-9574 OT Time Calculation (min): 34 min Charges:  OT General Charges $OT Visit: 1 Visit OT Evaluation $OT Eval Moderate Complexity: 1 Mod OT Treatments $Therapeutic Activity: 8-22 mins  Michel Bickers, OTR/L [facility] [phone]   Francesca Jewett 03/18/2020, 2:45 PM

## 2020-03-18 NOTE — Evaluation (Signed)
Physical Therapy Evaluation Patient Details Name: Alex Frazier MRN: [id number] DOB: 1945/12/01 Today's Date: 03/18/2020   History of Present Illness    Alex Frazier is a 74 y.o. male with medical history significant of Parkinson disease, hyperlipidemia, depression, degenerative disc disease, B12 deficiency, hypertension presents to emergency department for evaluation after falling 5 feet off of his porch onto dirt below. CT chest/abdomen pelvis revealed an acute appearing mild compression fracture at L3 and L5.  Chronic L1 vertebral body fracture with progressive height loss since prior study on 10/16/2019.    Clinical Impression  Pt presents with moderate to severe limitations to functional mobility in setting of Parkinson disease due to extreme pain with movement, postural rigidity, baseline motor control deficits, generalized weakness, and deficits of balance, resulting in inability to move around, change positions, or ambulate even with 2 person assist. Pt experienced increase in pain to "10/10" with transition to sitting EOB and attempting to stand; TLSO recommended for comfort but no brace at bedside for evaluation or to determine if bracing would assist with pain control. Given medical history and current injury sustained due to fall, pt is at high risk for fall/fall related injury. Wife is primary caregiver, and is willing and able to provide care at home if pt achieves minimal assist or better for mobility ADLs. Wife does not consider SNF a viable option, but pt will benefit from postacute rehab to address deficits, minimize risk of injury/readmission, and restore functional mobility prior to d/c home. After discussion with OT and consult with MD, recommend CIR consult to assess for potential admission.  PT will initiate rehab care in acute setting and assist with d/c planning as needed.      Follow Up Recommendations Supervision/Assistance - 24 hour;CIR    Equipment Recommendations  None  recommended by PT    Recommendations for Other Services Rehab consult     Precautions / Restrictions Precautions Precautions: Back Precaution Booklet Issued: No Precaution Comments: stable spinal fractures, ok to mobilize without brace per MD Required Braces or Orthoses: Spinal Brace Spinal Brace: Thoracolumbosacral orthotic (unsure of don/doff as order unclear; comfort only)      Mobility  Bed Mobility Overal bed mobility: Needs Assistance Bed Mobility: Rolling;Sidelying to Sit;Sit to Sidelying Rolling: Mod assist;+2 for physical assistance Sidelying to sit: Max assist     Sit to sidelying: Max assist General bed mobility comments: bed flat for mobility to protect spine and teach technique; pt limited by pain and mild fear/avoidance behaviors noted; stiffness in trunk due to PD exacerbated by pain  Transfers Overall transfer level: Needs assistance Equipment used: Rolling walker (2 wheeled) Transfers: Sit to/from Stand Sit to Stand: Mod assist;+2 safety/equipment;From elevated surface (bed simulates home height)         General transfer comment: wife assist for her comfort; pt unable to come to fully erect position, needs tactile and verbal cues to attempt upright posture; able to march in place and take 2-3 steps to side prior to return to sitting  Ambulation/Gait             General Gait Details: unable to progress gait due to pain limiting   Stairs            Wheelchair Mobility    Modified Rankin (Stroke Patients Only)       Balance Overall balance assessment: Mild deficits observed, not formally tested  Pertinent Vitals/Pain Pain Assessment: Faces Faces Pain Scale: Hurts whole lot Pain Location: back Pain Descriptors / Indicators: Grimacing;Guarding;Moaning Pain Intervention(s): Limited activity within patient's tolerance;Monitored during session;Repositioned (medicated 3 hours  before)    Home Living Family/patient expects to be discharged to:: Private residence Living Arrangements: Spouse/significant other Available Help at Discharge: Family;Available 24 hours/day Type of Home: House Home Access: Ramped entrance     Home Layout: One level Home Equipment: Walker - 4 wheels Additional Comments: pt has hx parkinsons, spouse is dedicated caregiver    Prior Function Level of Independence: Needs assistance   Gait / Transfers Assistance Needed: supervision to minimal assist, uses rollator, wife says biggest challenge is getting in/out of bed as pt wants to sit up vs. roll           Hand Dominance        Extremity/Trunk Assessment   Upper Extremity Assessment Upper Extremity Assessment: Defer to OT evaluation    Lower Extremity Assessment Lower Extremity Assessment: Generalized weakness    Cervical / Trunk Assessment Cervical / Trunk Assessment: Other exceptions (flexed consistent with PD)  Communication   Communication: HOH  Cognition Arousal/Alertness: Awake/alert Behavior During Therapy: WFL for tasks assessed/performed Overall Cognitive Status: Within Functional Limits for tasks assessed                                        General Comments General comments (skin integrity, edema, etc.): asymptomatic this session, though noted SaO2 monitor reading values 80's-90's when at rest    Exercises     Assessment/Plan    PT Assessment Patient needs continued PT services  PT Problem List Pain;Impaired tone;Decreased knowledge of use of DME;Decreased mobility;Decreased coordination;Decreased balance;Decreased activity tolerance;Decreased range of motion;Decreased strength       PT Treatment Interventions DME instruction;Gait training;Functional mobility training;Therapeutic activities;Therapeutic exercise;Neuromuscular re-education;Patient/family education;Modalities    PT Goals (Current goals can be found in the Care Plan  section)  Acute Rehab PT Goals Patient Stated Goal: home, min assist or better (wife) PT Goal Formulation: With patient/family Time For Goal Achievement: 04/01/20 Potential to Achieve Goals: Good    Frequency Min 5X/week   Barriers to discharge   pt needs to be at minimal assist level to d/c home    Co-evaluation               AM-PAC PT "6 Clicks" Mobility  Outcome Measure Help needed turning from your back to your side while in a flat bed without using bedrails?: Total Help needed moving from lying on your back to sitting on the side of a flat bed without using bedrails?: A Lot Help needed moving to and from a bed to a chair (including a wheelchair)?: Total Help needed standing up from a chair using your arms (e.g., wheelchair or bedside chair)?: A Lot Help needed to walk in hospital room?: Total Help needed climbing 3-5 steps with a railing? : Total 6 Click Score: 8    End of Session   Activity Tolerance: Patient limited by pain Patient left: in bed;with call bell/phone within reach;with nursing/sitter in room;with family/visitor present Nurse Communication: Mobility status PT Visit Diagnosis: Pain;Muscle weakness (generalized) (M62.81);Difficulty in walking, not elsewhere classified (R26.2);Other symptoms and signs involving the nervous system (R29.898) Pain - part of body:  (spine)    Time: 6967-8938 PT Time Calculation (min) (ACUTE ONLY): 42 min   Charges:   PT  Evaluation $PT Eval Low Complexity: 1 Low PT Treatments $Therapeutic Activity: 23-37 mins        Kearney Hard, PT, DPT, MS Board Certified Geriatric Clinical Specialist   Herbie Drape 03/18/2020, 1:58 PM

## 2020-03-18 NOTE — Progress Notes (Signed)
Occupational Therapy Treatment Patient Details Name: Alex Frazier MRN: [id number] DOB: 1946-01-22 Today's Date: 03/18/2020    History of present illness  Alex Frazier is a 74 y.o. male with medical history significant of Parkinson's disease, hyperlipidemia, depression, degenerative disc disease, B12 deficiency, hypertension presents to emergency department for evaluation after falling 5 feet off of his porch onto dirt below. CT chest/abdomen pelvis revealed an acute appearing mild compression fracture at L3 and L5.  Chronic L1 vertebral body fracture with progressive height loss since prior study on 10/16/2019.    OT comments  Pt received semi-reclined in bed, OT eval initiated earlier in the day, continued with treatment after speaking with MD about spinal precautions/TLSO order. According to Dr. Norina Buzzard, pt is cleared to mobilize without brace secondary to stable spinal fractures. However, TLSO order placed, awaiting AE arrival. Pt required max assist for bed mobility, using log roll technique and max encouragement to breathe throughout to relieve pain. Once seated EOB, pt washed face. Pt presents with kyphotic posture and posterior lean, requiring verbal/tactile cues for sitting up straight. Pt refused sit>stand secondary to pain. Extended time taken to educate pt/spouse on spinal precautions, potential use of portable bed railing and reacher for LB dressing (purchase items shown), log roll technique, and prepped spouse for managing TLSO (when it arrives). Plan on practicing donning/doffing TLSO with pt/spouse in upcoming treatments when TLSO arrives. Pt would benefit from continued skilled acute OT services to address spinal precautions, orthotic management, OOB ADLs, and functional mobility.     Follow Up Recommendations  CIR;Supervision/Assistance - 24 hour    Equipment Recommendations  Other (comment) (defer to next venue of care)    Recommendations for Other Services  None.     Precautions / Restrictions Precautions Precautions: Back Precaution Booklet Issued: No Precaution Comments: stable spinal fractures, ok to mobilize without brace per MD Required Braces or Orthoses: Spinal Brace Spinal Brace: Thoracolumbosacral orthotic Restrictions Weight Bearing Restrictions: No Other Position/Activity Restrictions: general spinal precautions       Mobility Bed Mobility Overal bed mobility: Needs Assistance Bed Mobility: Rolling;Sidelying to Sit;Sit to Sidelying Rolling: Mod assist;+2 for physical assistance Sidelying to sit: Max assist     Sit to sidelying: Max assist General bed mobility comments: bed flat for mobility to protect spine and teach technique; pt limited by pain and mild fear/avoidance behaviors noted; stiffness in trunk due to PD exacerbated by pain  Transfers Overall transfer level: Needs assistance Equipment used: Rolling walker (2 wheeled)             General transfer comment: pt declined stand secondary to fatigue and pain    Balance  Sitting EOB, pt required min guard-mod assist for trunk support intermittently, secondary to lower back pain, v/c's for sitting straight up with good posture. Pt refused to stand secondary to pain.         ADL either performed or assessed with clinical judgement   ADL Overall ADL's : Needs assistance/impaired     Grooming: Wash/dry hands;Wash/dry face;Set up;Sitting Grooming Details (indicate cue type and reason): pt washed face sitting EOB with CGA for trunk support                     Toileting- Clothing Manipulation and Hygiene: Total assistance;Bed level Toileting - Clothing Manipulation Details (indicate cue type and reason): pt using foley cath; 2 spillages, nursing notified to repair  Cognition Arousal/Alertness: Awake/alert Behavior During Therapy: WFL for tasks assessed/performed Overall Cognitive Status: Within Functional Limits for tasks  assessed                                 General Comments: cooperative                    General Comments WFL for vitals; extensive time taken to educate pt/spouse on spinal precautions/proper use of TLSO (in prep for arrival), purchasing options for portable bed railing, log roll technique, and deep breathing to relieve pain during functional movement; spent time with PT and MD discussing spinal precautions/need for more acute therapy before discharging secondary to safety    Pertinent Vitals/ Pain       Pain Assessment: 0-10 Pain Score: 5  Faces Pain Scale: Hurts whole lot Pain Location: back Pain Descriptors / Indicators: Grimacing;Guarding;Moaning Pain Intervention(s): Limited activity within patient's tolerance;Monitored during session;Repositioned         Frequency  Min 2X/week        Progress Toward Goals  OT Goals(current goals can now be found in the care plan section)  Progress towards OT goals: Progressing toward goals  Acute Rehab OT Goals Patient Stated Goal: home, min assist or better (wife) OT Goal Formulation: With patient/family Time For Goal Achievement: 04/01/20 Potential to Achieve Goals: Good  Plan Discharge plan remains appropriate    AM-PAC OT "6 Clicks" Daily Activity     Outcome Measure   Help from another person eating meals?: None Help from another person taking care of personal grooming?: A Little Help from another person toileting, which includes using toliet, bedpan, or urinal?: Total Help from another person bathing (including washing, rinsing, drying)?: A Lot Help from another person to put on and taking off regular upper body clothing?: A Lot Help from another person to put on and taking off regular lower body clothing?: Total 6 Click Score: 13    End of Session Equipment Utilized During Treatment: Gait belt  OT Visit Diagnosis: Repeated falls (R29.6);Muscle weakness (generalized) (M62.81);Pain Pain -  Right/Left:  (back) Pain - part of body:  (back)   Activity Tolerance Patient limited by fatigue;Patient limited by pain   Patient Left in bed;with call bell/phone within reach;with bed alarm set;with nursing/sitter in room;with family/visitor present   Nurse Communication Mobility status        Time: 1031-1110 OT Time Calculation (min): 39 min  Charges: OT General Charges $OT Visit: 1 Visit OT Treatments $Self Care/Home Management : 8-22 mins $Therapeutic Activity: 23-37 mins  Alex Frazier, OTR/L [facility] [phone]   Francesca Jewett 03/18/2020, 7:47 PM

## 2020-03-18 NOTE — Plan of Care (Signed)

## 2020-03-18 NOTE — Plan of Care (Signed)

## 2020-03-18 NOTE — Progress Notes (Signed)
PROGRESS NOTE    BRAVERY KETCHAM  XNA:[id number] DOB: 04/30/1946 DOA: 03/17/2020 PCP: Susy Frizzle, MD      Brief Narrative:  Mr. Launer is a 74 y.o. M with hx dementia, home dwelling, ambulatory with walker, Parkinson's disease, and hx TIA who presented with fall and new back pain.  In the ER, CT head, c-spine, maxilla, chest and abdomen showed no injuries or abnormalities other than new L3 and L5 vertebral compression fractures.  The patient was in severe pain, and so the hospitalist service were asked to place in observation for therapy recommendations.           Assessment & Plan:  Compression fracture, vertebral, L3 and L5, acute He had a mild L1 comp fx in Feb this year, treated with Tylenol and ibuprofen at home.   This one is much worse.  He was unable to get in the car to come here, he is unable now to sit on edge of bed, much less stand or walk.   -SNF -TLSO brace for comfort -Acetaminophen scheduled -Ibuprofen PRN -Oxycodone 2.5 mg PRN  -Calcitonin     Parkinson's disease -Continue Sinemet -Continue fludrocortisone for autonomic dysfunction -Hold midodrine given high BP with pain  Dementia Mentation at baseline -Continue donepezil -Hold duloxetine for now  History TIA -Continue pravastatin        Disposition: Status is: Inpatient  Remains inpatient appropriate because: he is in severe pain, unable to even sit on edge of bed with therapy today, and his only support at home is his 38 yo wife.    It is expected with pain regimen and TLSO brace and calcitonin that he will improve within 24-48 hours to be able to return home, otherwise will need SNF  He is in too much pain to tolerate the number of hours of therapy required for intensive rehabilitation, and I suspect he would be better served with a less aggressive therapy plan, either HHPT/OT or SNF if he is unable to go home.  Dispo: The patient is from: Home              Anticipated d/c  is to: TBD              Anticipated d/c date is: 1 day              Patient currently is not medically stable to d/c.              MDM: The below labs and imaging reports were reviewed and summarized above.  Medication management as above.    DVT prophylaxis: enoxaparin (LOVENOX) injection 40 mg Start: 03/17/20 2000 SCDs Start: 03/17/20 1815  Code Status: FULL Family Communication: Wife    Consultants:     Procedures:     Antimicrobials:      Culture data:              Subjective: Patient has back pain with any movement.  He also has some right-sided rib pain.  No confusion relative to his normal dementia.  No other respiratory symptoms GI symptoms.  Objective: Vitals:   03/17/20 2015 03/17/20 2338 03/18/20 0331 03/18/20 0806  BP: (!) 152/91 (!) 102/59 114/64 137/69  Pulse: 93 84 83 80  Resp: 17 16 17    Temp: 98.2 F (36.8 C) 98.4 F (36.9 C) 98 F (36.7 C) 98.7 F (37.1 C)  TempSrc: Oral Oral Oral Oral  SpO2: 100% 95% 96% 97%  Weight:  Height:        Intake/Output Summary (Last 24 hours) at 03/18/2020 1348 Last data filed at 03/17/2020 1732 Gross per 24 hour  Intake 400 ml  Output 100 ml  Net 300 ml   Filed Weights   03/17/20 1559  Weight: 59 kg    Examination: General appearance: Thin elderly adult male, alert and in no obvious distress at rest, but in obvious discomfort with any movement.   HEENT: Anicteric, conjunctiva pink, lids and lashes normal. No nasal deformity, discharge, epistaxis.  Lips moist.   Skin: Warm and dry.  No suspicious rashes or lesions. Cardiac: RRR, no murmurs, JVP normal, no lower extremity edema Respiratory: Normal respiratory rate and rhythm, lungs clear without rales or wheezes Abdomen: Abdomen soft without tenderness palpation or guarding, no ascites or distention. MSK: No deformities or effusions. Neuro: Awake and alert, extraocular movements intact, facies symmetric, moves upper extremities  with normal strength and coordination, unable to test leg strength due to pain.  Speech fluent. Psych: Sensorium intact and responding to questions, attention normal. Affect pleasant.  Judgment and insight appear impaired by dementia.    Data Reviewed: I have personally reviewed following labs and imaging studies:  CBC: Recent Labs  Lab 03/17/20 1606 03/17/20 1608 03/18/20 0316  WBC 8.6  --  7.4  HGB 13.7 13.6 12.3*  HCT 41.5 40.0 37.8*  MCV 89.1  --  88.5  PLT 242  --  355   Basic Metabolic Panel: Recent Labs  Lab 03/17/20 1606 03/17/20 1608 03/18/20 0316  NA 139 140 137  K 3.8 3.9 4.1  CL 104 101 102  CO2 27  --  28  GLUCOSE 110* 104* 116*  BUN 18 20 18   CREATININE 0.91 0.90 0.80  CALCIUM 9.3  --  8.9  MG 1.9  --   --   PHOS 3.3  --   --    GFR: Estimated Creatinine Clearance: 68.6 mL/min (by C-G formula based on SCr of 0.8 mg/dL). Liver Function Tests: Recent Labs  Lab 03/18/20 0316  AST 33  ALT 10  ALKPHOS 96  BILITOT 0.4  PROT 5.9*  ALBUMIN 3.3*   No results for input(s): LIPASE, AMYLASE in the last 168 hours. No results for input(s): AMMONIA in the last 168 hours. Coagulation Profile: No results for input(s): INR, PROTIME in the last 168 hours. Cardiac Enzymes: No results for input(s): CKTOTAL, CKMB, CKMBINDEX, TROPONINI in the last 168 hours. BNP (last 3 results) No results for input(s): PROBNP in the last 8760 hours. HbA1C: No results for input(s): HGBA1C in the last 72 hours. CBG: No results for input(s): GLUCAP in the last 168 hours. Lipid Profile: No results for input(s): CHOL, HDL, LDLCALC, TRIG, CHOLHDL, LDLDIRECT in the last 72 hours. Thyroid Function Tests: No results for input(s): TSH, T4TOTAL, FREET4, T3FREE, THYROIDAB in the last 72 hours. Anemia Panel: No results for input(s): VITAMINB12, FOLATE, FERRITIN, TIBC, IRON, RETICCTPCT in the last 72 hours. Urine analysis:    Component Value Date/Time   COLORURINE AMBER (A) 03/17/2020  1731   APPEARANCEUR HAZY (A) 03/17/2020 1731   LABSPEC >1.046 (H) 03/17/2020 1731   PHURINE 5.0 03/17/2020 1731   GLUCOSEU NEGATIVE 03/17/2020 1731   HGBUR NEGATIVE 03/17/2020 1731   BILIRUBINUR NEGATIVE 03/17/2020 1731   KETONESUR 5 (A) 03/17/2020 1731   PROTEINUR NEGATIVE 03/17/2020 1731   UROBILINOGEN 0.2 11/06/2008 1555   NITRITE NEGATIVE 03/17/2020 1731   LEUKOCYTESUR NEGATIVE 03/17/2020 1731   Sepsis Labs: @LABRCNTIP (procalcitonin:4,lacticacidven:4)  )  Recent Results (from the past 240 hour(s))  Urine Culture     Status: Abnormal   Collection Time: 03/17/20  5:31 PM   Specimen: Urine, Random  Result Value Ref Range Status   Specimen Description URINE, RANDOM  Final   Special Requests NONE  Final   Culture (A)  Final    <10,000 COLONIES/mL INSIGNIFICANT GROWTH Performed at [facility], [address], [place], [place] [address]    Report Status 03/18/2020 FINAL  Final  SARS Coronavirus 2 by RT PCR (hospital order, performed in [facility] hospital lab) Nasopharyngeal Nasopharyngeal Swab     Status: None   Collection Time: 03/17/20  6:05 PM   Specimen: Nasopharyngeal Swab  Result Value Ref Range Status   SARS Coronavirus 2 NEGATIVE NEGATIVE Final    Comment: (NOTE) SARS-CoV-2 target nucleic acids are NOT DETECTED.  The SARS-CoV-2 RNA is generally detectable in upper and lower respiratory specimens during the acute phase of infection. The lowest concentration of SARS-CoV-2 viral copies this assay can detect is 250 copies / mL. A negative result does not preclude SARS-CoV-2 infection and should not be used as the sole basis for treatment or other patient management decisions.  A negative result may occur with improper specimen collection / handling, submission of specimen other than nasopharyngeal swab, presence of viral mutation(s) within the areas targeted by this assay, and inadequate number of viral copies (<250 copies / mL). A negative result must be  combined with clinical observations, patient history, and epidemiological information.  Fact Sheet for Patients:   StrictlyIdeas.no  Fact Sheet for Healthcare Providers: BankingDealers.co.za  This test is not yet approved or  cleared by the [place] FDA and has been authorized for detection and/or diagnosis of SARS-CoV-2 by FDA under an Emergency Use Authorization (EUA).  This EUA will remain in effect (meaning this test can be used) for the duration of the COVID-19 declaration under Section 564(b)(1) of the Act, 21 U.S.C. section 360bbb-3(b)(1), unless the authorization is terminated or revoked sooner.  Performed at [facility], [place] [address], [place], [place] [address]   Surgical pcr screen     Status: None   Collection Time: 03/17/20 10:15 PM   Specimen: Nasal Mucosa; Nasal Swab  Result Value Ref Range Status   MRSA, PCR NEGATIVE NEGATIVE Final   Staphylococcus aureus NEGATIVE NEGATIVE Final    Comment: (NOTE) The Xpert SA Assay (FDA approved for NASAL specimens in patients 52 years of age and older), is one component of a comprehensive surveillance program. It is not intended to diagnose infection nor to guide or monitor treatment. Performed at [facility], [place] [address], [place],  [address]          Radiology Studies: CT Head Wo Contrast  Result Date: 03/17/2020 CLINICAL DATA:  [address] 5 feet, hypotension EXAM: CT HEAD WITHOUT CONTRAST CT MAXILLOFACIAL WITHOUT CONTRAST CT CERVICAL SPINE WITHOUT CONTRAST TECHNIQUE: Multidetector CT imaging of the head, cervical spine, and maxillofacial structures were performed using the standard protocol without intravenous contrast. Multiplanar CT image reconstructions of the cervical spine and maxillofacial structures were also generated. COMPARISON:  10/28/2019, 07/26/2019. FINDINGS: CT HEAD FINDINGS Brain: Scattered hypodensities within the periventricular white  matter consistent with stable chronic small vessel ischemic change. No signs of acute infarct or hemorrhage. Lateral ventricles and remaining midline structures are unremarkable. No acute extra-axial fluid collections. No mass effect. Vascular: No hyperdense vessel or unexpected calcification. Skull: Normal. Negative for fracture or focal  lesion. Other: None. CT MAXILLOFACIAL FINDINGS Osseous: No fracture or mandibular dislocation. No destructive process. Orbits: Negative. No traumatic or inflammatory finding. Sinuses: Clear. Soft tissues: Negative. CT CERVICAL SPINE FINDINGS Alignment: Alignment is anatomic. Skull base and vertebrae: No acute displaced fractures. Soft tissues and spinal canal: No prevertebral fluid or swelling. No visible canal hematoma. Disc levels: Mild spondylosis is seen at the C5/C6 level, with symmetrical neural foraminal encroachment. There is mild right predominant disc osteophyte complex and facet hypertrophy at C3/C4 with right-sided neural foraminal encroachment. Upper chest: Airway is patent. Lung apices are clear. Other: Reconstructed images demonstrate no additional findings. IMPRESSION: 1. Stable chronic small vessel ischemic changes. No acute intracranial process. 2. No acute facial bone fractures. 3. No acute cervical spine fracture. Electronically Signed   By: Randa Ngo M.D.   On: 03/17/2020 16:46   CT Chest W Contrast  Result Date: 03/17/2020 CLINICAL DATA:  Acute pain status post fall. Rib cage pain. Hypotensive. EXAM: CT CHEST, ABDOMEN, AND PELVIS WITH CONTRAST TECHNIQUE: Multidetector CT imaging of the chest, abdomen and pelvis was performed following the standard protocol during bolus administration of intravenous contrast. CONTRAST:  17mL OMNIPAQUE IOHEXOL 300 MG/ML  SOLN COMPARISON:  10/16/2019 FINDINGS: CT CHEST FINDINGS Cardiovascular: The heart size is normal. There are atherosclerotic changes of the thoracic aorta without evidence for dissection or aneurysm.  There is no large centrally located pulmonary embolism. Mediastinum/Nodes: -- No mediastinal lymphadenopathy. -- No hilar lymphadenopathy. -- No axillary lymphadenopathy. -- No supraclavicular lymphadenopathy. -- Normal thyroid gland where visualized. -the esophagus is dilated and fluid-filled to the upper thorax. Lungs/Pleura: There is a pulmonary nodule in the left upper lobe measuring approximately 3 mm (axial series 6, image 23). There is atelectasis at the lung bases. There is no pneumothorax. Musculoskeletal: No chest wall abnormality. No bony spinal canal stenosis. CT ABDOMEN PELVIS FINDINGS Hepatobiliary: The liver is normal. Normal gallbladder.There is no biliary ductal dilation. Pancreas: Normal contours without ductal dilatation. No peripancreatic fluid collection. Spleen: Unremarkable. Adrenals/Urinary Tract: --Adrenal glands: Unremarkable. --Right kidney/ureter: No hydronephrosis or radiopaque kidney stones. --Left kidney/ureter: No hydronephrosis or radiopaque kidney stones. --Urinary bladder: There is mild diffuse bladder wall thickening. Stomach/Bowel: --Stomach/Duodenum: No hiatal hernia or other gastric abnormality. Normal duodenal course and caliber. --Small bowel: Unremarkable. --Colon: There is a large amount of stool in the colon. --Appendix: Not visualized. No right lower quadrant inflammation or free fluid. Vascular/Lymphatic: Atherosclerotic calcification is present within the non-aneurysmal abdominal aorta, without hemodynamically significant stenosis. --No retroperitoneal lymphadenopathy. --No mesenteric lymphadenopathy. --No pelvic or inguinal lymphadenopathy. Reproductive: Unremarkable Other: No ascites or free air. The abdominal wall is normal. Musculoskeletal. There is an age-indeterminate severe compression fracture of the L1 vertebral body. There is a questionable mild compression fracture through the superior endplate of the L2 vertebral body. There are acute appearing mild  compression fractures of the L3 and L5 vertebral bodies. There is a subtle superior endplate deformity of the T12 vertebral body (coronal series 8, image 63). This is stable since the patient's prior lumbar CT and is favored to represent a normal variant or old compression fracture. IMPRESSION: 1. Acute appearing mild compression fractures of the L3 and L5 vertebral bodies. Chronic L1 vertebral body fracture with progressive height loss since prior study dated 10/16/2019. There is a questionable mild compression fracture through the superior endplate of the L2 vertebral body. Large amount of stool in the colon. 2. Mild diffuse bladder wall thickening. Correlate with urinalysis to exclude cystitis. 3. A 3 mm pulmonary  nodule in the left upper lobe. No follow-up needed if patient is low-risk. Non-contrast chest CT can be considered in 12 months if patient is high-risk. This recommendation follows the consensus statement: Guidelines for Management of Incidental Pulmonary Nodules Detected on CT Images: From the Fleischner Society 2017; Radiology 2017; 284:228-243. Aortic Atherosclerosis (ICD10-I70.0). Electronically Signed   By: Constance Holster M.D.   On: 03/17/2020 16:56   CT Cervical Spine Wo Contrast  Result Date: 03/17/2020 CLINICAL DATA:  [address] 5 feet, hypotension EXAM: CT HEAD WITHOUT CONTRAST CT MAXILLOFACIAL WITHOUT CONTRAST CT CERVICAL SPINE WITHOUT CONTRAST TECHNIQUE: Multidetector CT imaging of the head, cervical spine, and maxillofacial structures were performed using the standard protocol without intravenous contrast. Multiplanar CT image reconstructions of the cervical spine and maxillofacial structures were also generated. COMPARISON:  10/28/2019, 07/26/2019. FINDINGS: CT HEAD FINDINGS Brain: Scattered hypodensities within the periventricular white matter consistent with stable chronic small vessel ischemic change. No signs of acute infarct or hemorrhage. Lateral ventricles and remaining midline  structures are unremarkable. No acute extra-axial fluid collections. No mass effect. Vascular: No hyperdense vessel or unexpected calcification. Skull: Normal. Negative for fracture or focal lesion. Other: None. CT MAXILLOFACIAL FINDINGS Osseous: No fracture or mandibular dislocation. No destructive process. Orbits: Negative. No traumatic or inflammatory finding. Sinuses: Clear. Soft tissues: Negative. CT CERVICAL SPINE FINDINGS Alignment: Alignment is anatomic. Skull base and vertebrae: No acute displaced fractures. Soft tissues and spinal canal: No prevertebral fluid or swelling. No visible canal hematoma. Disc levels: Mild spondylosis is seen at the C5/C6 level, with symmetrical neural foraminal encroachment. There is mild right predominant disc osteophyte complex and facet hypertrophy at C3/C4 with right-sided neural foraminal encroachment. Upper chest: Airway is patent. Lung apices are clear. Other: Reconstructed images demonstrate no additional findings. IMPRESSION: 1. Stable chronic small vessel ischemic changes. No acute intracranial process. 2. No acute facial bone fractures. 3. No acute cervical spine fracture. Electronically Signed   By: Randa Ngo M.D.   On: 03/17/2020 16:46   CT Abdomen Pelvis W Contrast  Result Date: 03/17/2020 CLINICAL DATA:  Acute pain status post fall. Rib cage pain. Hypotensive. EXAM: CT CHEST, ABDOMEN, AND PELVIS WITH CONTRAST TECHNIQUE: Multidetector CT imaging of the chest, abdomen and pelvis was performed following the standard protocol during bolus administration of intravenous contrast. CONTRAST:  166mL OMNIPAQUE IOHEXOL 300 MG/ML  SOLN COMPARISON:  10/16/2019 FINDINGS: CT CHEST FINDINGS Cardiovascular: The heart size is normal. There are atherosclerotic changes of the thoracic aorta without evidence for dissection or aneurysm. There is no large centrally located pulmonary embolism. Mediastinum/Nodes: -- No mediastinal lymphadenopathy. -- No hilar lymphadenopathy. --  No axillary lymphadenopathy. -- No supraclavicular lymphadenopathy. -- Normal thyroid gland where visualized. -the esophagus is dilated and fluid-filled to the upper thorax. Lungs/Pleura: There is a pulmonary nodule in the left upper lobe measuring approximately 3 mm (axial series 6, image 23). There is atelectasis at the lung bases. There is no pneumothorax. Musculoskeletal: No chest wall abnormality. No bony spinal canal stenosis. CT ABDOMEN PELVIS FINDINGS Hepatobiliary: The liver is normal. Normal gallbladder.There is no biliary ductal dilation. Pancreas: Normal contours without ductal dilatation. No peripancreatic fluid collection. Spleen: Unremarkable. Adrenals/Urinary Tract: --Adrenal glands: Unremarkable. --Right kidney/ureter: No hydronephrosis or radiopaque kidney stones. --Left kidney/ureter: No hydronephrosis or radiopaque kidney stones. --Urinary bladder: There is mild diffuse bladder wall thickening. Stomach/Bowel: --Stomach/Duodenum: No hiatal hernia or other gastric abnormality. Normal duodenal course and caliber. --Small bowel: Unremarkable. --Colon: There is a large amount of stool in the colon. --Appendix:  Not visualized. No right lower quadrant inflammation or free fluid. Vascular/Lymphatic: Atherosclerotic calcification is present within the non-aneurysmal abdominal aorta, without hemodynamically significant stenosis. --No retroperitoneal lymphadenopathy. --No mesenteric lymphadenopathy. --No pelvic or inguinal lymphadenopathy. Reproductive: Unremarkable Other: No ascites or free air. The abdominal wall is normal. Musculoskeletal. There is an age-indeterminate severe compression fracture of the L1 vertebral body. There is a questionable mild compression fracture through the superior endplate of the L2 vertebral body. There are acute appearing mild compression fractures of the L3 and L5 vertebral bodies. There is a subtle superior endplate deformity of the T12 vertebral body (coronal series 8,  image 63). This is stable since the patient's prior lumbar CT and is favored to represent a normal variant or old compression fracture. IMPRESSION: 1. Acute appearing mild compression fractures of the L3 and L5 vertebral bodies. Chronic L1 vertebral body fracture with progressive height loss since prior study dated 10/16/2019. There is a questionable mild compression fracture through the superior endplate of the L2 vertebral body. Large amount of stool in the colon. 2. Mild diffuse bladder wall thickening. Correlate with urinalysis to exclude cystitis. 3. A 3 mm pulmonary nodule in the left upper lobe. No follow-up needed if patient is low-risk. Non-contrast chest CT can be considered in 12 months if patient is high-risk. This recommendation follows the consensus statement: Guidelines for Management of Incidental Pulmonary Nodules Detected on CT Images: From the Fleischner Society 2017; Radiology 2017; 284:228-243. Aortic Atherosclerosis (ICD10-I70.0). Electronically Signed   By: Constance Holster M.D.   On: 03/17/2020 16:56   DG Pelvis Portable  Result Date: 03/17/2020 CLINICAL DATA:  [address] from porch EXAM: PORTABLE PELVIS 1-2 VIEWS COMPARISON:  10/16/2019 FINDINGS: Supine frontal view of the pelvis was performed. There are no acute displaced fractures. Hips are well aligned. Sacroiliac joints are normal. Chronic spondylosis at the lumbosacral junction unchanged. IMPRESSION: 1. No acute pelvic fracture. Electronically Signed   By: Randa Ngo M.D.   On: 03/17/2020 16:04   DG Chest Port 1 View  Result Date: 03/17/2020 CLINICAL DATA:  Fall from [place]: PORTABLE CHEST 1 VIEW COMPARISON:  August 11, 2016. FINDINGS: The heart size and mediastinal contours are within normal limits. Both lungs are clear. The visualized skeletal structures are unremarkable. IMPRESSION: No active disease. Electronically Signed   By: Constance Holster M.D.   On: 03/17/2020 16:02   CT Maxillofacial WO CM  Result Date:  03/17/2020 CLINICAL DATA:  [address] 5 feet, hypotension EXAM: CT HEAD WITHOUT CONTRAST CT MAXILLOFACIAL WITHOUT CONTRAST CT CERVICAL SPINE WITHOUT CONTRAST TECHNIQUE: Multidetector CT imaging of the head, cervical spine, and maxillofacial structures were performed using the standard protocol without intravenous contrast. Multiplanar CT image reconstructions of the cervical spine and maxillofacial structures were also generated. COMPARISON:  10/28/2019, 07/26/2019. FINDINGS: CT HEAD FINDINGS Brain: Scattered hypodensities within the periventricular white matter consistent with stable chronic small vessel ischemic change. No signs of acute infarct or hemorrhage. Lateral ventricles and remaining midline structures are unremarkable. No acute extra-axial fluid collections. No mass effect. Vascular: No hyperdense vessel or unexpected calcification. Skull: Normal. Negative for fracture or focal lesion. Other: None. CT MAXILLOFACIAL FINDINGS Osseous: No fracture or mandibular dislocation. No destructive process. Orbits: Negative. No traumatic or inflammatory finding. Sinuses: Clear. Soft tissues: Negative. CT CERVICAL SPINE FINDINGS Alignment: Alignment is anatomic. Skull base and vertebrae: No acute displaced fractures. Soft tissues and spinal canal: No prevertebral fluid or swelling. No visible canal hematoma. Disc levels: Mild spondylosis is seen at the C5/C6 level,  with symmetrical neural foraminal encroachment. There is mild right predominant disc osteophyte complex and facet hypertrophy at C3/C4 with right-sided neural foraminal encroachment. Upper chest: Airway is patent. Lung apices are clear. Other: Reconstructed images demonstrate no additional findings. IMPRESSION: 1. Stable chronic small vessel ischemic changes. No acute intracranial process. 2. No acute facial bone fractures. 3. No acute cervical spine fracture. Electronically Signed   By: Randa Ngo M.D.   On: 03/17/2020 16:46        Scheduled Meds: .  carbidopa-levodopa  2.5 tablet Oral 3 times per day  . Carbidopa-Levodopa ER  1 tablet Oral QHS  . cholecalciferol  2,000 Units Oral q AM  . donepezil  5 mg Oral QHS  . DULoxetine  60 mg Oral QHS  . enoxaparin (LOVENOX) injection  40 mg Subcutaneous Q24H  . fludrocortisone  0.1 mg Oral q AM  . polyethylene glycol  17 g Oral Daily  . pravastatin  20 mg Oral QHS   Continuous Infusions:   LOS: 1 day    Time spent: 25 minutes    Edwin Dada, MD [facility] 03/18/2020, 1:48 PM     Please page though Watson or Epic secure chat:  For [employer], Adult nurse

## 2020-03-18 NOTE — Progress Notes (Signed)
Orthopedic Tech Progress Note Patient Details:  [name] 11-15-1945 [id number] Ordered outside vendor brace Patient ID: Alex Frazier, male   DOB: 03-08-1946, 74 y.o.   MRN: [id number]   Tammy Sours 03/18/2020, 11:21 AM

## 2020-03-19 ENCOUNTER — Encounter (HOSPITAL_COMMUNITY): Payer: Self-pay | Admitting: Internal Medicine

## 2020-03-19 NOTE — Progress Notes (Signed)
PROGRESS NOTE    [name]  HAL:[id number] DOB: [birth year] DOA: 03/17/2020 PCP: Susy Frizzle, MD      Brief Narrative:  Alex Frazier is a 74 y.o. M with hx dementia, home dwelling, ambulatory with walker, Parkinson's disease, and hx TIA who presented with fall and new back pain.  In the ER, CT head, c-spine, maxilla, chest and abdomen showed no injuries or abnormalities other than new L3 and L5 vertebral compression fractures.  The patient was in severe pain, and so the hospitalist service were asked to place in observation for therapy recommendations.           Assessment & Plan:  Acute vertebral compression fracture, L3 and L5 He had a mild L1 comp fx in Feb this year, treated with Tylenol and ibuprofen at home.   Patient unable to get out of bed overnight -TLSO brace for comfort -Scheduled acetaminophen -Oxycodone 2.5 mg as needed -Ibuprofen as needed -Continue calcitonin -Work-up for SNF placement   Parkinson's disease  -Continue Sinemet -Continue fludrocortisone for autonomic dysfunction -Hold midodrine given high blood pressure   Dementia Mentation at baseline -Continue donepezil -Hold duloxetine for now  History TIA -Continue pravastatin        Disposition: Status is: Inpatient  Remains inpatient appropriate because: he is in severe pain to his acute vertebral compression fractures, his only support at home is his 55 yo wife, and he has so far been unable to do more than stand for a few moments because of his pain.  He is unable to walk, use a bedside commode.    He is not able to tolerate more than a few moments of physical activity, and would not be able to participate in several hours of therapy activities per day.  I feel he would be best treated with a less aggressive therapy plan, in skilled nursing setting with PT/OT 2-3 times per week  Dispo: The patient is from: Home              Anticipated d/c is to: TBD               Anticipated d/c date is: 1 day              Patient currently is medically stable for discharge, SNF pending              MDM: The below labs and imaging reports reviewed and summarized above.  Medication management as above.    DVT prophylaxis: enoxaparin (LOVENOX) injection 40 mg Start: 03/17/20 2000  Code Status: FULL Family Communication: Wife    Consultants:     Procedures:     Antimicrobials:      Culture data:              Subjective: No new confusion, fever, respiratory symptoms.  His appetite is good.  His mentation is at baseline.  He still has severe back pain.  Objective: Vitals:   03/18/20 0806 03/18/20 2028 03/19/20 0336 03/19/20 0906  BP: 137/69 (!) 153/74 (!) 145/66 (!) 150/77  Pulse: 80 89 81 92  Resp:  16 18 17   Temp: 98.7 F (37.1 C) 98.9 F (37.2 C) 98 F (36.7 C) 98.8 F (37.1 C)  TempSrc: Oral Oral Oral Oral  SpO2: 97% 95% 96% 96%  Weight:      Height:        Intake/Output Summary (Last 24 hours) at 03/19/2020 0951 Last data filed at 03/19/2020 0900 Gross per 24 hour  Intake 540 ml  Output 1400 ml  Net -860 ml   Filed Weights   03/17/20 1559  Weight: 59 kg    Examination: General appearance: Thin elderly adult male, lying in bed, watching television.     HEENT: Anicteric, conjunctival pink, lids and lashes normal.  No nasal deformity, discharge, or epistaxis. Skin:  Cardiac: RRR, no murmurs, no lower extremity edema Respiratory: Normal respiratory rate and rhythm, lungs clear without rales or wheezes Abdomen:   MSK: Kyphotic. Neuro:    Psych: Attention normal, affect pleasant, judgment insight appear severely impaired by dementia     Data Reviewed: I have personally reviewed following labs and imaging studies:  CBC: Recent Labs  Lab 03/17/20 1606 03/17/20 1608 03/18/20 0316  WBC 8.6  --  7.4  HGB 13.7 13.6 12.3*  HCT 41.5 40.0 37.8*  MCV 89.1  --  88.5  PLT 242  --  175   Basic Metabolic  Panel: Recent Labs  Lab 03/17/20 1606 03/17/20 1608 03/18/20 0316  NA 139 140 137  K 3.8 3.9 4.1  CL 104 101 102  CO2 27  --  28  GLUCOSE 110* 104* 116*  BUN 18 20 18   CREATININE 0.91 0.90 0.80  CALCIUM 9.3  --  8.9  MG 1.9  --   --   PHOS 3.3  --   --    GFR: Estimated Creatinine Clearance: 68.6 mL/min (by C-G formula based on SCr of 0.8 mg/dL). Liver Function Tests: Recent Labs  Lab 03/18/20 0316  AST 33  ALT 10  ALKPHOS 96  BILITOT 0.4  PROT 5.9*  ALBUMIN 3.3*   No results for input(s): LIPASE, AMYLASE in the last 168 hours. No results for input(s): AMMONIA in the last 168 hours. Coagulation Profile: No results for input(s): INR, PROTIME in the last 168 hours. Cardiac Enzymes: No results for input(s): CKTOTAL, CKMB, CKMBINDEX, TROPONINI in the last 168 hours. BNP (last 3 results) No results for input(s): PROBNP in the last 8760 hours. HbA1C: No results for input(s): HGBA1C in the last 72 hours. CBG: No results for input(s): GLUCAP in the last 168 hours. Lipid Profile: No results for input(s): CHOL, HDL, LDLCALC, TRIG, CHOLHDL, LDLDIRECT in the last 72 hours. Thyroid Function Tests: No results for input(s): TSH, T4TOTAL, FREET4, T3FREE, THYROIDAB in the last 72 hours. Anemia Panel: No results for input(s): VITAMINB12, FOLATE, FERRITIN, TIBC, IRON, RETICCTPCT in the last 72 hours. Urine analysis:    Component Value Date/Time   COLORURINE AMBER (A) 03/17/2020 1731   APPEARANCEUR HAZY (A) 03/17/2020 1731   LABSPEC >1.046 (H) 03/17/2020 1731   PHURINE 5.0 03/17/2020 1731   GLUCOSEU NEGATIVE 03/17/2020 1731   HGBUR NEGATIVE 03/17/2020 1731   BILIRUBINUR NEGATIVE 03/17/2020 1731   KETONESUR 5 (A) 03/17/2020 1731   PROTEINUR NEGATIVE 03/17/2020 1731   UROBILINOGEN 0.2 11/06/2008 1555   NITRITE NEGATIVE 03/17/2020 1731   LEUKOCYTESUR NEGATIVE 03/17/2020 1731   Sepsis Labs: @LABRCNTIP (procalcitonin:4,lacticacidven:4)  ) Recent Results (from the past 240  hour(s))  Urine Culture     Status: Abnormal   Collection Time: 03/17/20  5:31 PM   Specimen: Urine, Random  Result Value Ref Range Status   Specimen Description URINE, RANDOM  Final   Special Requests NONE  Final   Culture (A)  Final    <10,000 COLONIES/mL INSIGNIFICANT GROWTH Performed at [facility], [place] [address], [place], [place] [address]    Report Status 03/18/2020 FINAL  Final  SARS Coronavirus 2 by  RT PCR (hospital order, performed in [facility] hospital lab) Nasopharyngeal Nasopharyngeal Swab     Status: None   Collection Time: 03/17/20  6:05 PM   Specimen: Nasopharyngeal Swab  Result Value Ref Range Status   SARS Coronavirus 2 NEGATIVE NEGATIVE Final    Comment: (NOTE) SARS-CoV-2 target nucleic acids are NOT DETECTED.  The SARS-CoV-2 RNA is generally detectable in upper and lower respiratory specimens during the acute phase of infection. The lowest concentration of SARS-CoV-2 viral copies this assay can detect is 250 copies / mL. A negative result does not preclude SARS-CoV-2 infection and should not be used as the sole basis for treatment or other patient management decisions.  A negative result may occur with improper specimen collection / handling, submission of specimen other than nasopharyngeal swab, presence of viral mutation(s) within the areas targeted by this assay, and inadequate number of viral copies (<250 copies / mL). A negative result must be combined with clinical observations, patient history, and epidemiological information.  Fact Sheet for Patients:   StrictlyIdeas.no  Fact Sheet for Healthcare Providers: BankingDealers.co.za  This test is not yet approved or  cleared by the [place] FDA and has been authorized for detection and/or diagnosis of SARS-CoV-2 by FDA under an Emergency Use Authorization (EUA).  This EUA will remain in effect (meaning this test can be used) for the duration of  the COVID-19 declaration under Section 564(b)(1) of the Act, 21 U.S.C. section 360bbb-3(b)(1), unless the authorization is terminated or revoked sooner.  Performed at [facility], [place] [address], [place], [place] [address]   Surgical pcr screen     Status: None   Collection Time: 03/17/20 10:15 PM   Specimen: Nasal Mucosa; Nasal Swab  Result Value Ref Range Status   MRSA, PCR NEGATIVE NEGATIVE Final   Staphylococcus aureus NEGATIVE NEGATIVE Final    Comment: (NOTE) The Xpert SA Assay (FDA approved for NASAL specimens in patients 48 years of age and older), is one component of a comprehensive surveillance program. It is not intended to diagnose infection nor to guide or monitor treatment. Performed at [facility], [place] [address] [place], [place] [address]          Radiology Studies: CT Head Wo Contrast  Result Date: 03/17/2020 CLINICAL DATA:  [address] 5 feet, hypotension EXAM: CT HEAD WITHOUT CONTRAST CT MAXILLOFACIAL WITHOUT CONTRAST CT CERVICAL SPINE WITHOUT CONTRAST TECHNIQUE: Multidetector CT imaging of the head, cervical spine, and maxillofacial structures were performed using the standard protocol without intravenous contrast. Multiplanar CT image reconstructions of the cervical spine and maxillofacial structures were also generated. COMPARISON:  10/28/2019, 07/26/2019. FINDINGS: CT HEAD FINDINGS Brain: Scattered hypodensities within the periventricular white matter consistent with stable chronic small vessel ischemic change. No signs of acute infarct or hemorrhage. Lateral ventricles and remaining midline structures are unremarkable. No acute extra-axial fluid collections. No mass effect. Vascular: No hyperdense vessel or unexpected calcification. Skull: Normal. Negative for fracture or focal lesion. Other: None. CT MAXILLOFACIAL FINDINGS Osseous: No fracture or mandibular dislocation. No destructive process. Orbits: Negative. No traumatic or inflammatory finding.  Sinuses: Clear. Soft tissues: Negative. CT CERVICAL SPINE FINDINGS Alignment: Alignment is anatomic. Skull base and vertebrae: No acute displaced fractures. Soft tissues and spinal canal: No prevertebral fluid or swelling. No visible canal hematoma. Disc levels: Mild spondylosis is seen at the C5/C6 level, with symmetrical neural foraminal encroachment. There is mild right predominant disc osteophyte complex and facet hypertrophy at C3/C4 with right-sided neural foraminal encroachment.  Upper chest: Airway is patent. Lung apices are clear. Other: Reconstructed images demonstrate no additional findings. IMPRESSION: 1. Stable chronic small vessel ischemic changes. No acute intracranial process. 2. No acute facial bone fractures. 3. No acute cervical spine fracture. Electronically Signed   By: Randa Ngo M.D.   On: 03/17/2020 16:46   CT Chest W Contrast  Result Date: 03/17/2020 CLINICAL DATA:  Acute pain status post fall. Rib cage pain. Hypotensive. EXAM: CT CHEST, ABDOMEN, AND PELVIS WITH CONTRAST TECHNIQUE: Multidetector CT imaging of the chest, abdomen and pelvis was performed following the standard protocol during bolus administration of intravenous contrast. CONTRAST:  143mL OMNIPAQUE IOHEXOL 300 MG/ML  SOLN COMPARISON:  10/16/2019 FINDINGS: CT CHEST FINDINGS Cardiovascular: The heart size is normal. There are atherosclerotic changes of the thoracic aorta without evidence for dissection or aneurysm. There is no large centrally located pulmonary embolism. Mediastinum/Nodes: -- No mediastinal lymphadenopathy. -- No hilar lymphadenopathy. -- No axillary lymphadenopathy. -- No supraclavicular lymphadenopathy. -- Normal thyroid gland where visualized. -the esophagus is dilated and fluid-filled to the upper thorax. Lungs/Pleura: There is a pulmonary nodule in the left upper lobe measuring approximately 3 mm (axial series 6, image 23). There is atelectasis at the lung bases. There is no pneumothorax.  Musculoskeletal: No chest wall abnormality. No bony spinal canal stenosis. CT ABDOMEN PELVIS FINDINGS Hepatobiliary: The liver is normal. Normal gallbladder.There is no biliary ductal dilation. Pancreas: Normal contours without ductal dilatation. No peripancreatic fluid collection. Spleen: Unremarkable. Adrenals/Urinary Tract: --Adrenal glands: Unremarkable. --Right kidney/ureter: No hydronephrosis or radiopaque kidney stones. --Left kidney/ureter: No hydronephrosis or radiopaque kidney stones. --Urinary bladder: There is mild diffuse bladder wall thickening. Stomach/Bowel: --Stomach/Duodenum: No hiatal hernia or other gastric abnormality. Normal duodenal course and caliber. --Small bowel: Unremarkable. --Colon: There is a large amount of stool in the colon. --Appendix: Not visualized. No right lower quadrant inflammation or free fluid. Vascular/Lymphatic: Atherosclerotic calcification is present within the non-aneurysmal abdominal aorta, without hemodynamically significant stenosis. --No retroperitoneal lymphadenopathy. --No mesenteric lymphadenopathy. --No pelvic or inguinal lymphadenopathy. Reproductive: Unremarkable Other: No ascites or free air. The abdominal wall is normal. Musculoskeletal. There is an age-indeterminate severe compression fracture of the L1 vertebral body. There is a questionable mild compression fracture through the superior endplate of the L2 vertebral body. There are acute appearing mild compression fractures of the L3 and L5 vertebral bodies. There is a subtle superior endplate deformity of the T12 vertebral body (coronal series 8, image 63). This is stable since the patient's prior lumbar CT and is favored to represent a normal variant or old compression fracture. IMPRESSION: 1. Acute appearing mild compression fractures of the L3 and L5 vertebral bodies. Chronic L1 vertebral body fracture with progressive height loss since prior study dated 10/16/2019. There is a questionable mild  compression fracture through the superior endplate of the L2 vertebral body. Large amount of stool in the colon. 2. Mild diffuse bladder wall thickening. Correlate with urinalysis to exclude cystitis. 3. A 3 mm pulmonary nodule in the left upper lobe. No follow-up needed if patient is low-risk. Non-contrast chest CT can be considered in 12 months if patient is high-risk. This recommendation follows the consensus statement: Guidelines for Management of Incidental Pulmonary Nodules Detected on CT Images: From the Fleischner Society 2017; Radiology 2017; 284:228-243. Aortic Atherosclerosis (ICD10-I70.0). Electronically Signed   By: Constance Holster M.D.   On: 03/17/2020 16:56   CT Cervical Spine Wo Contrast  Result Date: 03/17/2020 CLINICAL DATA:  [address] 5 feet, hypotension EXAM: CT HEAD WITHOUT  CONTRAST CT MAXILLOFACIAL WITHOUT CONTRAST CT CERVICAL SPINE WITHOUT CONTRAST TECHNIQUE: Multidetector CT imaging of the head, cervical spine, and maxillofacial structures were performed using the standard protocol without intravenous contrast. Multiplanar CT image reconstructions of the cervical spine and maxillofacial structures were also generated. COMPARISON:  10/28/2019, 07/26/2019. FINDINGS: CT HEAD FINDINGS Brain: Scattered hypodensities within the periventricular white matter consistent with stable chronic small vessel ischemic change. No signs of acute infarct or hemorrhage. Lateral ventricles and remaining midline structures are unremarkable. No acute extra-axial fluid collections. No mass effect. Vascular: No hyperdense vessel or unexpected calcification. Skull: Normal. Negative for fracture or focal lesion. Other: None. CT MAXILLOFACIAL FINDINGS Osseous: No fracture or mandibular dislocation. No destructive process. Orbits: Negative. No traumatic or inflammatory finding. Sinuses: Clear. Soft tissues: Negative. CT CERVICAL SPINE FINDINGS Alignment: Alignment is anatomic. Skull base and vertebrae: No acute  displaced fractures. Soft tissues and spinal canal: No prevertebral fluid or swelling. No visible canal hematoma. Disc levels: Mild spondylosis is seen at the C5/C6 level, with symmetrical neural foraminal encroachment. There is mild right predominant disc osteophyte complex and facet hypertrophy at C3/C4 with right-sided neural foraminal encroachment. Upper chest: Airway is patent. Lung apices are clear. Other: Reconstructed images demonstrate no additional findings. IMPRESSION: 1. Stable chronic small vessel ischemic changes. No acute intracranial process. 2. No acute facial bone fractures. 3. No acute cervical spine fracture. Electronically Signed   By: Randa Ngo M.D.   On: 03/17/2020 16:46   CT Abdomen Pelvis W Contrast  Result Date: 03/17/2020 CLINICAL DATA:  Acute pain status post fall. Rib cage pain. Hypotensive. EXAM: CT CHEST, ABDOMEN, AND PELVIS WITH CONTRAST TECHNIQUE: Multidetector CT imaging of the chest, abdomen and pelvis was performed following the standard protocol during bolus administration of intravenous contrast. CONTRAST:  15mL OMNIPAQUE IOHEXOL 300 MG/ML  SOLN COMPARISON:  10/16/2019 FINDINGS: CT CHEST FINDINGS Cardiovascular: The heart size is normal. There are atherosclerotic changes of the thoracic aorta without evidence for dissection or aneurysm. There is no large centrally located pulmonary embolism. Mediastinum/Nodes: -- No mediastinal lymphadenopathy. -- No hilar lymphadenopathy. -- No axillary lymphadenopathy. -- No supraclavicular lymphadenopathy. -- Normal thyroid gland where visualized. -the esophagus is dilated and fluid-filled to the upper thorax. Lungs/Pleura: There is a pulmonary nodule in the left upper lobe measuring approximately 3 mm (axial series 6, image 23). There is atelectasis at the lung bases. There is no pneumothorax. Musculoskeletal: No chest wall abnormality. No bony spinal canal stenosis. CT ABDOMEN PELVIS FINDINGS Hepatobiliary: The liver is normal.  Normal gallbladder.There is no biliary ductal dilation. Pancreas: Normal contours without ductal dilatation. No peripancreatic fluid collection. Spleen: Unremarkable. Adrenals/Urinary Tract: --Adrenal glands: Unremarkable. --Right kidney/ureter: No hydronephrosis or radiopaque kidney stones. --Left kidney/ureter: No hydronephrosis or radiopaque kidney stones. --Urinary bladder: There is mild diffuse bladder wall thickening. Stomach/Bowel: --Stomach/Duodenum: No hiatal hernia or other gastric abnormality. Normal duodenal course and caliber. --Small bowel: Unremarkable. --Colon: There is a large amount of stool in the colon. --Appendix: Not visualized. No right lower quadrant inflammation or free fluid. Vascular/Lymphatic: Atherosclerotic calcification is present within the non-aneurysmal abdominal aorta, without hemodynamically significant stenosis. --No retroperitoneal lymphadenopathy. --No mesenteric lymphadenopathy. --No pelvic or inguinal lymphadenopathy. Reproductive: Unremarkable Other: No ascites or free air. The abdominal wall is normal. Musculoskeletal. There is an age-indeterminate severe compression fracture of the L1 vertebral body. There is a questionable mild compression fracture through the superior endplate of the L2 vertebral body. There are acute appearing mild compression fractures of the L3 and L5 vertebral  bodies. There is a subtle superior endplate deformity of the T12 vertebral body (coronal series 8, image 63). This is stable since the patient's prior lumbar CT and is favored to represent a normal variant or old compression fracture. IMPRESSION: 1. Acute appearing mild compression fractures of the L3 and L5 vertebral bodies. Chronic L1 vertebral body fracture with progressive height loss since prior study dated 10/16/2019. There is a questionable mild compression fracture through the superior endplate of the L2 vertebral body. Large amount of stool in the colon. 2. Mild diffuse bladder wall  thickening. Correlate with urinalysis to exclude cystitis. 3. A 3 mm pulmonary nodule in the left upper lobe. No follow-up needed if patient is low-risk. Non-contrast chest CT can be considered in 12 months if patient is high-risk. This recommendation follows the consensus statement: Guidelines for Management of Incidental Pulmonary Nodules Detected on CT Images: From the Fleischner Society 2017; Radiology 2017; 284:228-243. Aortic Atherosclerosis (ICD10-I70.0). Electronically Signed   By: Constance Holster M.D.   On: 03/17/2020 16:56   DG Pelvis Portable  Result Date: 03/17/2020 CLINICAL DATA:  [address] from porch EXAM: PORTABLE PELVIS 1-2 VIEWS COMPARISON:  10/16/2019 FINDINGS: Supine frontal view of the pelvis was performed. There are no acute displaced fractures. Hips are well aligned. Sacroiliac joints are normal. Chronic spondylosis at the lumbosacral junction unchanged. IMPRESSION: 1. No acute pelvic fracture. Electronically Signed   By: Randa Ngo M.D.   On: 03/17/2020 16:04   DG Chest Port 1 View  Result Date: 03/17/2020 CLINICAL DATA:  Fall from [place]: PORTABLE CHEST 1 VIEW COMPARISON:  August 11, 2016. FINDINGS: The heart size and mediastinal contours are within normal limits. Both lungs are clear. The visualized skeletal structures are unremarkable. IMPRESSION: No active disease. Electronically Signed   By: Constance Holster M.D.   On: 03/17/2020 16:02   CT Maxillofacial WO CM  Result Date: 03/17/2020 CLINICAL DATA:  [address] 5 feet, hypotension EXAM: CT HEAD WITHOUT CONTRAST CT MAXILLOFACIAL WITHOUT CONTRAST CT CERVICAL SPINE WITHOUT CONTRAST TECHNIQUE: Multidetector CT imaging of the head, cervical spine, and maxillofacial structures were performed using the standard protocol without intravenous contrast. Multiplanar CT image reconstructions of the cervical spine and maxillofacial structures were also generated. COMPARISON:  10/28/2019, 07/26/2019. FINDINGS: CT HEAD FINDINGS Brain:  Scattered hypodensities within the periventricular white matter consistent with stable chronic small vessel ischemic change. No signs of acute infarct or hemorrhage. Lateral ventricles and remaining midline structures are unremarkable. No acute extra-axial fluid collections. No mass effect. Vascular: No hyperdense vessel or unexpected calcification. Skull: Normal. Negative for fracture or focal lesion. Other: None. CT MAXILLOFACIAL FINDINGS Osseous: No fracture or mandibular dislocation. No destructive process. Orbits: Negative. No traumatic or inflammatory finding. Sinuses: Clear. Soft tissues: Negative. CT CERVICAL SPINE FINDINGS Alignment: Alignment is anatomic. Skull base and vertebrae: No acute displaced fractures. Soft tissues and spinal canal: No prevertebral fluid or swelling. No visible canal hematoma. Disc levels: Mild spondylosis is seen at the C5/C6 level, with symmetrical neural foraminal encroachment. There is mild right predominant disc osteophyte complex and facet hypertrophy at C3/C4 with right-sided neural foraminal encroachment. Upper chest: Airway is patent. Lung apices are clear. Other: Reconstructed images demonstrate no additional findings. IMPRESSION: 1. Stable chronic small vessel ischemic changes. No acute intracranial process. 2. No acute facial bone fractures. 3. No acute cervical spine fracture. Electronically Signed   By: Randa Ngo M.D.   On: 03/17/2020 16:46        Scheduled Meds: . acetaminophen  1,000  mg Oral TID  . calcitonin (salmon)  1 spray Alternating Nares Daily  . carbidopa-levodopa  2.5 tablet Oral 3 times per day  . Carbidopa-Levodopa ER  1 tablet Oral QHS  . cholecalciferol  2,000 Units Oral q AM  . donepezil  5 mg Oral QHS  . DULoxetine  60 mg Oral QHS  . enoxaparin (LOVENOX) injection  40 mg Subcutaneous Q24H  . fludrocortisone  0.1 mg Oral q AM  . polyethylene glycol  17 g Oral Daily  . pravastatin  20 mg Oral QHS   Continuous Infusions:   LOS:  2 days    Time spent: 25 minutes    Edwin Dada, MD [facility] 03/19/2020, 9:51 AM     Please page though [facility] or Epic secure chat:  For [employer], Adult nurse

## 2020-03-19 NOTE — Progress Notes (Signed)
Rehab Admissions Coordinator Note:  Per PT and OT recommendation, this patient was screened by Raechel Ache for appropriateness for an Inpatient Acute Rehab Consult.  At this time, we are recommending [facility]. Pt lacks the medical complexity needed to qualify or an IP Rehab admission. Pt's other medical conditions appear stable. Given that pt's pain is limiting factor for mobility, agree with MD note that pt is more appropriate for a less intense, slower-paced program, such as a SNF. AC will not pursue CIR at this time.   Raechel Ache 03/19/2020, 1:27 PM  I can be reached at [phone].

## 2020-03-19 NOTE — Consult Note (Signed)
   [facility] Inpatient Consult   03/19/2020  Alex Frazier April 07, 1946 [id number]   [name] Patient:  [name] Care Management was referred to identify needs within this patient who is eligible for are coordination with [place] program.    Patient does meet the requirements [place] program for post hospital personal care needs in the [facility] Accountable Care Organization [ACO].  [facility] Care Management could be involved after [place], should community case management needs are identified. Made inpatient Transition of Care RNCM aware of patient eligibility.  Of note, [facility] Care Management services does not replace or interfere with any services that are arranged by inpatient case management or social work.  For additional questions or referrals please contact:     Alex Brood, RN BSN [facility] Liaison  [phone] business mobile phone Toll free office [phone]  Fax number: [phone] [email or web address]

## 2020-03-19 NOTE — Progress Notes (Signed)
Physical Therapy Treatment Patient Details Name: Alex Frazier MRN: [id number] DOB: Jan 23, 1946 Today's Date: 03/19/2020    History of Present Illness Alex Frazier is a 74 y.o. male with medical history significant of Parkinson's disease, hyperlipidemia, depression, degenerative disc disease, B12 deficiency, and hypertension who presents to emergency department for evaluation after falling 5 feet off of his porch onto dirt below. CT chest/abdomen pelvis revealed an acute appearing mild compression fracture at L3 and L5.  Chronic L1 vertebral body fracture with progressive height loss since prior study on 10/16/2019.    PT Comments    Pt in bed upon arrival of PT, agreeable to session to determine d/c plan after denial from CIR. The pt was able to complete bed mobility and initial transfers, but requires mod/maxA for both which is more than the wife is able to provide at this time. The pt and his wife are in agreement that further rehab prior to return home will help to progress functional strength, balance, and coordination to facilitate return to prior level of independence and reduce risk of falls at home.     Follow Up Recommendations  SNF;Supervision/Assistance - 24 hour     Equipment Recommendations   (defer to post acute)    Recommendations for Other Services       Precautions / Restrictions Precautions Precautions: Back Precaution Booklet Issued: No Precaution Comments: stable spinal fractures, ok to mobilize without brace per MD Required Braces or Orthoses: Spinal Brace Spinal Brace: Thoracolumbosacral orthotic Restrictions Weight Bearing Restrictions: No Other Position/Activity Restrictions: general spinal precautions    Mobility  Bed Mobility Overal bed mobility: Needs Assistance Bed Mobility: Rolling;Sidelying to Sit;Sit to Sidelying Rolling: Mod assist Sidelying to sit: Max assist     Sit to sidelying: Max assist General bed mobility comments: pt benefits from  cues for sequencing, pt limited by pain and stiffness due to PD which is eacerbated by pain  Transfers Overall transfer level: Needs assistance Equipment used: Rolling walker (2 wheeled) Transfers: Sit to/from Stand Sit to Stand: Max assist         General transfer comment: pt requires sig assist and momentum to power up, slowed rise wit sig cues to straighten BLE and extend hips. mod to maintain once standing  Ambulation/Gait Ambulation/Gait assistance:  (deferred due to safety concerns)           General Gait Details: unable to progress gait due to pain limiting       Balance Overall balance assessment: Needs assistance Sitting-balance support: Bilateral upper extremity supported;Feet supported Sitting balance-Leahy Scale: Poor Sitting balance - Comments: minA to maintain, pt drifting backwards otherwise Postural control: Posterior lean Standing balance support: Bilateral upper extremity supported;During functional activity Standing balance-Leahy Scale: Poor Standing balance comment: maxA with BUE support                            Cognition Arousal/Alertness: Awake/alert Behavior During Therapy: WFL for tasks assessed/performed Overall Cognitive Status: Within Functional Limits for tasks assessed                                 General Comments: cooperative, delayed responses with minimal adjustments made to correct safety issues/balance without cues      Exercises      General Comments        Pertinent Vitals/Pain Pain Assessment: 0-10 Pain Score: 8  Pain Location:  back Pain Descriptors / Indicators: Grimacing;Guarding;Moaning Pain Intervention(s): Limited activity within patient's tolerance;Monitored during session;Repositioned;Premedicated before session           PT Goals (current goals can now be found in the care plan section) Acute Rehab PT Goals Patient Stated Goal: home, min assist or better (wife) PT Goal  Formulation: With patient/family Time For Goal Achievement: 04/01/20 Potential to Achieve Goals: Good Progress towards PT goals: Progressing toward goals    Frequency    Min 3X/week      PT Plan Discharge plan needs to be updated       AM-PAC PT "6 Clicks" Mobility   Outcome Measure  Help needed turning from your back to your side while in a flat bed without using bedrails?: A Lot Help needed moving from lying on your back to sitting on the side of a flat bed without using bedrails?: A Lot Help needed moving to and from a bed to a chair (including a wheelchair)?: Total Help needed standing up from a chair using your arms (e.g., wheelchair or bedside chair)?: A Lot Help needed to walk in hospital room?: Total Help needed climbing 3-5 steps with a railing? : Total 6 Click Score: 9    End of Session Equipment Utilized During Treatment: Gait belt;Back brace Activity Tolerance: Patient limited by pain;Patient limited by fatigue Patient left: in bed;with call bell/phone within reach;with family/visitor present Nurse Communication: Mobility status PT Visit Diagnosis: Pain;Muscle weakness (generalized) (M62.81);Difficulty in walking, not elsewhere classified (R26.2);Other symptoms and signs involving the nervous system (R29.898) Pain - part of body:  (back)     Time: 3524-8185 PT Time Calculation (min) (ACUTE ONLY): 41 min  Charges:  $Therapeutic Activity: 23-37 mins $Self Care/Home Management: 8-22                     Karma Ganja, PT, DPT   Acute Rehabilitation Department Pager #: [phone]   Otho Bellows 03/19/2020, 6:03 PM

## 2020-03-19 NOTE — Plan of Care (Signed)

## 2020-03-20 MED ORDER — IBUPROFEN 200 MG PO TABS
600.0000 mg | ORAL_TABLET | Freq: Three times a day (TID) | ORAL | Status: DC | PRN
  Administered 2020-03-21: 600 mg via ORAL
  Filled 2020-03-20: qty 3

## 2020-03-20 NOTE — Progress Notes (Signed)
Occupational Therapy Treatment Patient Details Name: Alex Frazier MRN: [id number] DOB: 1946-05-20 Today's Date: 03/20/2020    History of present illness Alex Frazier is a 74 y.o. male with medical history significant of Parkinson's disease, hyperlipidemia, depression, degenerative disc disease, B12 deficiency, and hypertension who presents to emergency department for evaluation after falling 5 feet off of his porch onto dirt below. CT chest/abdomen pelvis revealed an acute appearing mild compression fracture at L3 and L5.  Chronic L1 vertebral body fracture with progressive height loss since prior study on 10/16/2019.   OT comments  Patient continues to make progress towards goals in skilled OT session. Patient's session encompassed attempts to transfer to chair and don TLSO brace to promote increased endurance and activity tolerance OOB. Despite extra time and sequencing, pt was a total A to come to EOB, as well as total A to don brace and attempt to stand with RW. Due to kyphotic posture and posterior lean, pt was unable to don brace in sitting, requiring therapist to bring pt to standing to prior to donning. Despite increased time and assist, pt was unable to stand x4 attempts. Therapist spent an extensive amount of time educating spouse and pt for the need of skilled services at a SNF, as spouse does not have the ability or resources to care for the patient at home. Spouse reluctantly in agreement, therefore discharge recommendations have been updated. Will continue to follow acutely.    Follow Up Recommendations  SNF    Equipment Recommendations  Other (comment) (defer to next venue)    Recommendations for Other Services      Precautions / Restrictions Precautions Precautions: Back Precaution Booklet Issued: No Precaution Comments: stable spinal fractures, ok to mobilize without brace per MD Required Braces or Orthoses: Spinal Brace Spinal Brace: Thoracolumbosacral  orthotic Restrictions Weight Bearing Restrictions: No Other Position/Activity Restrictions: general spinal precautions       Mobility Bed Mobility Overal bed mobility: Needs Assistance Bed Mobility: Rolling;Sidelying to Sit;Sit to Sidelying Rolling: Max assist Sidelying to sit: Total assist       General bed mobility comments: pt benefits from cues for sequencing, pt limited by sitffness but despite extra time given was a total A to transition EOB  Transfers Overall transfer level: Needs assistance Equipment used: Rolling walker (2 wheeled) Transfers: Sit to/from Stand Sit to Stand: Total assist         General transfer comment: Pt attempted to stand x4 in session with therapsit, despite significant help; unable to come to standing    Balance Overall balance assessment: Needs assistance Sitting-balance support: Bilateral upper extremity supported;Feet supported Sitting balance-Leahy Scale: Poor Sitting balance - Comments: minA to maintain, pt drifting backwards otherwise Postural control: Posterior lean     Standing balance comment: Unable to come to standing                           ADL either performed or assessed with clinical judgement   ADL                                       Functional mobility during ADLs: Total assistance;Moderate assistance;Maximal assistance General ADL Comments: Pt mod A for set up for grooming EOB, however max to total in all other aspects of care     Vision       Perception  Praxis      Cognition Arousal/Alertness: Awake/alert Behavior During Therapy: WFL for tasks assessed/performed Overall Cognitive Status: Within Functional Limits for tasks assessed                                 General Comments: cooperative, delayed responses, upset at end of session for need to go to a SNF        Exercises     Shoulder Instructions       General Comments      Pertinent  Vitals/ Pain       Pain Assessment: Faces Faces Pain Scale: Hurts even more Pain Location: R ribs Pain Descriptors / Indicators: Grimacing;Guarding Pain Intervention(s): Limited activity within patient's tolerance;Monitored during session  Home Living                                          Prior Functioning/Environment              Frequency  Min 2X/week        Progress Toward Goals  OT Goals(current goals can now be found in the care plan section)  Progress towards OT goals: Not progressing toward goals - comment (Requiring significant assist to get to EOB, unable to stand in session)  Acute Rehab OT Goals Patient Stated Goal: Rehab OT Goal Formulation: With patient/family Time For Goal Achievement: 04/01/20 Potential to Achieve Goals: Good  Plan Discharge plan needs to be updated    Co-evaluation                 AM-PAC OT "6 Clicks" Daily Activity     Outcome Measure   Help from another person eating meals?: A Little Help from another person taking care of personal grooming?: A Little Help from another person toileting, which includes using toliet, bedpan, or urinal?: Total Help from another person bathing (including washing, rinsing, drying)?: Total Help from another person to put on and taking off regular upper body clothing?: A Lot Help from another person to put on and taking off regular lower body clothing?: Total 6 Click Score: 11    End of Session Equipment Utilized During Treatment: Gait belt;Rolling walker  OT Visit Diagnosis: Repeated falls (R29.6);Muscle weakness (generalized) (M62.81);Pain Pain - Right/Left: Right Pain - part of body:  (Ribs)   Activity Tolerance Patient limited by fatigue;Patient limited by pain   Patient Left in bed;with call bell/phone within reach;with family/visitor present (Sitting EOB; RN aware)   Nurse Communication Mobility status        Time: 8182-9937 OT Time Calculation (min): 30  min  Charges: OT General Charges $OT Visit: 1 Visit OT Treatments $Self Care/Home Management : 23-37 mins   [place]. [place], Alexander Acute Rehabilitation Services [place] 03/20/2020, 1:28 PM

## 2020-03-20 NOTE — TOC Initial Note (Signed)
Transition of Care [facility]) - Initial/Assessment Note    Patient Details  Name: Alex Frazier MRN: [id number] Date of Birth: [birth year]  Transition of Care [facility]) CM/SW Contact:    Sharin Mons, RN Phone Number: 03/20/2020, 6:59 PM  Clinical Narrative:    Present s/p fall, suffered compression fracture at L3 and L5,  hx of Parkinson's disease, hyperlipidemia, depression, degenerative disc disease, B12 deficiency, and hypertension.     Wife and pt now agreeable to SNF placement after seeing pt's limited mobility while working with the OT. NCM reinforced recommendation from PT with patient and wife. Given patient's current physical needs and fall risk wife states she will be unable to manage @ home.  Patient reports preference for  [facility].NCM discussed insurance authorization process and provided Medicare SNF ratings list. Patient/wife expressed being hopeful for rehab and to feel better soon. No further questions reported at this time. NCM to continue to follow and assist with discharge planning needs. Pt fully vaccinated.   Expected Discharge Plan: Skilled Nursing Facility Barriers to Discharge: [place])   Patient Goals and CMS Choice        Expected Discharge Plan and Services Expected Discharge Plan: [place]                                              Prior Living Arrangements/Services                       Activities of Daily Living Home Assistive Devices/Equipment: Hearing aid, Other (Comment) (rolator) ADL Screening (condition at time of admission) Patient's cognitive ability adequate to safely complete daily activities?: Yes (most of the time per spouse) Is the patient deaf or have difficulty hearing?: Yes Does the patient have difficulty seeing, even when wearing glasses/contacts?: No Does the patient have difficulty concentrating, remembering, or making decisions?: Yes (sometimes) Patient  able to express need for assistance with ADLs?: Yes Does the patient have difficulty dressing or bathing?: Yes Independently performs ADLs?: Yes (appropriate for developmental age) Does the patient have difficulty walking or climbing stairs?: Yes Weakness of Legs: Both Weakness of Arms/Hands: None  Permission Sought/Granted                  Emotional Assessment              Admission diagnosis:  Parkinson disease ([place]) [G20] Trauma [T14.90XA] Lumbar compression fracture ([place]) [S32.000A] Fall, initial encounter [W19.XXXA] Compression fracture of L5 lumbar vertebra, closed, initial encounter ([place]) [S32.050A] Compression fracture of L3 lumbar vertebra, closed, initial encounter ([place]) [S32.030A] Compression fracture of L2 vertebra, initial encounter [facility]) [J62.831D] Patient Active Problem List   Diagnosis Date Noted   Pulmonary nodule    Lumbar compression fracture ([place]) 10/26/2019   DDD (degenerative disc disease), cervical 04/07/2016   Neuropathy    Bulging discs    Gastritis    Parkinson's disease ([place])    Hyperlipidemia    PCP:  Susy Frizzle, MD Pharmacy:   [facility] [place], [place] [address] Phone: [phone] Fax: [phone]  [facility] [place], [place] [address] Phone: [phone] Fax: [phone]  [facility] [place],  [place] [address] [place] [address] Phone: [phone] Fax: [phone]     Social Determinants of Health (SDOH) Interventions    Readmission Risk Interventions No flowsheet data found.

## 2020-03-20 NOTE — Plan of Care (Signed)

## 2020-03-20 NOTE — NC FL2 (Signed)
[place] LEVEL OF CARE SCREENING TOOL     IDENTIFICATION  Patient Name: Alex Frazier Birthdate: 1946-05-20 Sex: male Admission Date (Current Location): 03/17/2020  [facility] and [place] Number:  Herbalist and Address:  [facility] [place], [place] [address], [place], [place] [address]      Provider Number: [id number]  Attending Physician Name and Address:  Edwin Dada, *  Relative Name and Phone Number:       Current Level of Care: SNF Recommended Level of Care: [place] Prior Approval Number:    Date Approved/Denied:   PASRR Number: pending  Discharge Plan: SNF    Current Diagnoses: Patient Active Problem List   Diagnosis Date Noted  . Pulmonary nodule   . Lumbar compression fracture ([place]) 10/26/2019  . DDD (degenerative disc disease), cervical 04/07/2016  . Neuropathy   . Bulging discs   . Gastritis   . Parkinson's disease ([place])   . Hyperlipidemia     Orientation RESPIRATION BLADDER Height & Weight     Self, Situation, Place, Time  Normal External catheter Weight: 59 kg Height:  5\' 7"  (170.2 cm)  BEHAVIORAL SYMPTOMS/MOOD NEUROLOGICAL BOWEL NUTRITION STATUS      Continent Diet (refer to d/c summary)  AMBULATORY STATUS COMMUNICATION OF NEEDS Skin   Extensive Assist Verbally Normal                       Personal Care Assistance Level of Assistance  Bathing, Feeding, Dressing Bathing Assistance: Maximum assistance Feeding assistance: Limited assistance Dressing Assistance: Maximum assistance     Functional Limitations Info  Sight, Speech, Hearing Sight Info: Adequate Hearing Info: Adequate Speech Info: Adequate    SPECIAL CARE FACTORS FREQUENCY  PT (By licensed PT), OT (By licensed OT)     PT Frequency: 5x/week , evaluate and treat OT Frequency: 5x/week , evaluate and treat            Contractures Contractures Info: Not present    Additional Factors Info  Allergies,  Code Status Code Status Info: Full Code Allergies Info: Tape, Hydrocodone-acetaminophen, Indomethacin, Lyrica Pregabalin, Quetiapine           Current Medications (03/20/2020):  This is the current hospital active medication list Current Facility-Administered Medications  Medication Dose Route Frequency Provider Last Rate Last Admin  . acetaminophen (TYLENOL) tablet 1,000 mg  1,000 mg Oral TID Edwin Dada, MD   1,000 mg at 03/20/20 0909  . calcitonin (salmon) (MIACALCIN/FORTICAL) nasal spray 1 spray  1 spray Alternating Nares Daily Danford, Suann Larry, MD   1 spray at 03/20/20 0914  . carbidopa-levodopa (SINEMET IR) 25-100 MG per tablet immediate release 2.5 tablet  2.5 tablet Oral 3 times per day Mckinley Jewel, MD   2.5 tablet at 03/20/20 0910  . Carbidopa-Levodopa ER (SINEMET CR) 25-100 MG tablet controlled release 1 tablet  1 tablet Oral QHS Pahwani, Rinka R, MD   1 tablet at 03/19/20 2342  . cholecalciferol (VITAMIN D3) tablet 2,000 Units  2,000 Units Oral q AM Pahwani, Rinka R, MD   2,000 Units at 03/20/20 0910  . donepezil (ARICEPT) tablet 5 mg  5 mg Oral QHS Pahwani, Rinka R, MD   5 mg at 03/19/20 2342  . DULoxetine (CYMBALTA) DR capsule 60 mg  60 mg Oral QHS Pahwani, Rinka R, MD   60 mg at 03/19/20 2343  . enoxaparin (LOVENOX) injection 40 mg  40 mg Subcutaneous Q24H  Mckinley Jewel, MD   40 mg at 03/19/20 2343  . fludrocortisone (FLORINEF) tablet 0.1 mg  0.1 mg Oral q AM Pahwani, Rinka R, MD   0.1 mg at 03/20/20 0909  . ibuprofen (ADVIL) tablet 600 mg  600 mg Oral Q8H PRN Danford, Suann Larry, MD      . ondansetron (ZOFRAN) tablet 4 mg  4 mg Oral Q6H PRN Pahwani, Rinka R, MD       Or  . ondansetron (ZOFRAN) injection 4 mg  4 mg Intravenous Q6H PRN Pahwani, Rinka R, MD      . oxyCODONE (Oxy IR/ROXICODONE) immediate release tablet 2.5 mg  2.5 mg Oral Q6H PRN Edwin Dada, MD   2.5 mg at 03/19/20 1646  . polyethylene glycol (MIRALAX / GLYCOLAX) packet 17 g   17 g Oral Daily Pahwani, Rinka R, MD   17 g at 03/20/20 0908  . pravastatin (PRAVACHOL) tablet 20 mg  20 mg Oral QHS Pahwani, Rinka R, MD   20 mg at 03/19/20 2343     Discharge Medications: Please see discharge summary for a list of discharge medications.  Relevant Imaging Results:  Relevant Lab Results:   Additional Information SS# [id number]  Sharin Mons, RN

## 2020-03-20 NOTE — Progress Notes (Signed)
RE: Alex Frazier Date of Birth: 1946/04/14 Date: 03/20/2020  Please be advised that the above-named patient will require a short-term nursing home stay - anticipated 30 days or less for rehabilitation and strengthening.  The plan is for return home.

## 2020-03-20 NOTE — Progress Notes (Signed)
PROGRESS NOTE    Alex Frazier  JWJ:[id number] DOB: 1946/08/10 DOA: 03/17/2020 PCP: Susy Frizzle, MD      Brief Narrative:  [name] is a 74 y.o. M with hx dementia, home dwelling, ambulatory with walker, Parkinson's disease, and hx TIA who presented with fall and new back pain.  In the ER, CT head, c-spine, maxilla, chest and abdomen showed no injuries or abnormalities other than new L3 and L5 vertebral compression fractures.  The patient was in severe pain, and so the hospitalist service were asked to place in observation for therapy recommendations.           Assessment & Plan:  Acute vertebral body compression fracture, L3 and L5 He had a mild L1 comp fx in Feb this year, treated with Tylenol and ibuprofen at home.   Patient's pain is unchanged -TLSO brace for comfort  At discharge: -Schedule acetaminophen 1000 mg at 7 AM, 1 PM, and 7 PM (with Sinemet) -Nursing order for incentive spirometer at 7 AM, 1 PM, and 7 PM -Continue calcitonin nasal spray once daily for 2 weeks -Continue oxycodone 2.5 mg as needed for severe pain which the patient has tolerated well here  -Check blood pressure daily and resume midodrine when systolic blood pressure drops below 120     Parkinson's disease -Continue Sinemet -Continue fludrocortisone for autonomic dysfunction -Hold midodrine given high blood pressure   Dementia Mentation at baseline -Continue donepezil -Resume duloxetine at discharge  History TIA -Continue pravastatin        Disposition: Status is: Inpatient  Remains inpatient appropriate because: The patient remains unable to walk or use a bedside commode and has not got a safe disposition at this time.    Dispo: The patient is from: Home              Anticipated d/c is to: SNF              Anticipated d/c date is: 1 day              Patient currently is medically stable for discharge, SNF pending    Past are pending today, likely to [facility]  Wednesday          MDM: The below labs and imaging reports reviewed and summarized above.  Medication management as above.    DVT prophylaxis: enoxaparin (LOVENOX) injection 40 mg Start: 03/17/20 2000  Code Status: FULL Family Communication: Wife    Consultants:     Procedures:     Antimicrobials:      Culture data:              Subjective: Patient has back and rib pain.  No fever, respiratory symptoms.  No confusion.    Objective: Vitals:   03/20/20 0300 03/20/20 0759 03/20/20 1459 03/20/20 2158  BP: 130/71 (!) 154/82 (!) 146/83 128/69  Pulse: 72 77 76 82  Resp: 16 17 17 18   Temp: 98.3 F (36.8 C) 98.2 F (36.8 C) 98.3 F (36.8 C) 98.3 F (36.8 C)  TempSrc: Axillary Oral Oral Oral  SpO2: 94% 94% 98% 95%  Weight:      Height:        Intake/Output Summary (Last 24 hours) at 03/20/2020 2210 Last data filed at 03/20/2020 1900 Gross per 24 hour  Intake 480 ml  Output 2200 ml  Net -1720 ml   Filed Weights   03/17/20 1559  Weight: 59 kg    Examination: General appearance: Thin elderly adult male,  sitting on the edge of the bed, unable to hold himself straight, unable to lie back in bed by himself.     HEENT:  Skin:  Cardiac: RRR, no murmurs, no lower extremity edema  respiratory: Normal respiratory rate and rhythm, lungs clear without rales or wheezes Abdomen:   MSK: Kyphotic, no crepitus over the rib cage, no tenderness palpation over the rib cage. Neuro: Upper extremity strength seems symmetrically diminished, testing is limited by pain.  Speech is fluent.  Eye contact and extraocular movements are intact. Psych: Attention normal, affect pleasant, judgment insight appear severely impaired by dementia   Data Reviewed: I have personally reviewed following labs and imaging studies:  CBC: Recent Labs  Lab 03/17/20 1606 03/17/20 1608 03/18/20 0316  WBC 8.6  --  7.4  HGB 13.7 13.6 12.3*  HCT 41.5 40.0 37.8*  MCV 89.1  --  88.5    PLT 242  --  742   Basic Metabolic Panel: Recent Labs  Lab 03/17/20 1606 03/17/20 1608 03/18/20 0316  NA 139 140 137  K 3.8 3.9 4.1  CL 104 101 102  CO2 27  --  28  GLUCOSE 110* 104* 116*  BUN 18 20 18   CREATININE 0.91 0.90 0.80  CALCIUM 9.3  --  8.9  MG 1.9  --   --   PHOS 3.3  --   --    GFR: Estimated Creatinine Clearance: 68.6 mL/min (by C-G formula based on SCr of 0.8 mg/dL). Liver Function Tests: Recent Labs  Lab 03/18/20 0316  AST 33  ALT 10  ALKPHOS 96  BILITOT 0.4  PROT 5.9*  ALBUMIN 3.3*   No results for input(s): LIPASE, AMYLASE in the last 168 hours. No results for input(s): AMMONIA in the last 168 hours. Coagulation Profile: No results for input(s): INR, PROTIME in the last 168 hours. Cardiac Enzymes: No results for input(s): CKTOTAL, CKMB, CKMBINDEX, TROPONINI in the last 168 hours. BNP (last 3 results) No results for input(s): PROBNP in the last 8760 hours. HbA1C: No results for input(s): HGBA1C in the last 72 hours. CBG: No results for input(s): GLUCAP in the last 168 hours. Lipid Profile: No results for input(s): CHOL, HDL, LDLCALC, TRIG, CHOLHDL, LDLDIRECT in the last 72 hours. Thyroid Function Tests: No results for input(s): TSH, T4TOTAL, FREET4, T3FREE, THYROIDAB in the last 72 hours. Anemia Panel: No results for input(s): VITAMINB12, FOLATE, FERRITIN, TIBC, IRON, RETICCTPCT in the last 72 hours. Urine analysis:    Component Value Date/Time   COLORURINE AMBER (A) 03/17/2020 1731   APPEARANCEUR HAZY (A) 03/17/2020 1731   LABSPEC >1.046 (H) 03/17/2020 1731   PHURINE 5.0 03/17/2020 1731   GLUCOSEU NEGATIVE 03/17/2020 1731   HGBUR NEGATIVE 03/17/2020 1731   BILIRUBINUR NEGATIVE 03/17/2020 1731   KETONESUR 5 (A) 03/17/2020 1731   PROTEINUR NEGATIVE 03/17/2020 1731   UROBILINOGEN 0.2 11/06/2008 1555   NITRITE NEGATIVE 03/17/2020 1731   LEUKOCYTESUR NEGATIVE 03/17/2020 1731   Sepsis  Labs: @LABRCNTIP (procalcitonin:4,lacticacidven:4)  ) Recent Results (from the past 240 hour(s))  Urine Culture     Status: Abnormal   Collection Time: 03/17/20  5:31 PM   Specimen: Urine, Random  Result Value Ref Range Status   Specimen Description URINE, RANDOM  Final   Special Requests NONE  Final   Culture (A)  Final    <10,000 COLONIES/mL INSIGNIFICANT GROWTH Performed at [facility], [place] [address] [place], [place] [address]    Report Status 03/18/2020 FINAL  Final  SARS Coronavirus  2 by RT PCR (hospital order, performed in [facility] hospital lab) Nasopharyngeal Nasopharyngeal Swab     Status: None   Collection Time: 03/17/20  6:05 PM   Specimen: Nasopharyngeal Swab  Result Value Ref Range Status   SARS Coronavirus 2 NEGATIVE NEGATIVE Final    Comment: (NOTE) SARS-CoV-2 target nucleic acids are NOT DETECTED.  The SARS-CoV-2 RNA is generally detectable in upper and lower respiratory specimens during the acute phase of infection. The lowest concentration of SARS-CoV-2 viral copies this assay can detect is 250 copies / mL. A negative result does not preclude SARS-CoV-2 infection and should not be used as the sole basis for treatment or other patient management decisions.  A negative result may occur with improper specimen collection / handling, submission of specimen other than nasopharyngeal swab, presence of viral mutation(s) within the areas targeted by this assay, and inadequate number of viral copies (<250 copies / mL). A negative result must be combined with clinical observations, patient history, and epidemiological information.  Fact Sheet for Patients:   StrictlyIdeas.no  Fact Sheet for Healthcare Providers: BankingDealers.co.za  This test is not yet approved or  cleared by the [place] FDA and has been authorized for detection and/or diagnosis of SARS-CoV-2 by FDA under an Emergency Use Authorization  (EUA).  This EUA will remain in effect (meaning this test can be used) for the duration of the COVID-19 declaration under Section 564(b)(1) of the Act, 21 U.S.C. section 360bbb-3(b)(1), unless the authorization is terminated or revoked sooner.  Performed at [facility], [place] [address], [place], [place] [address]   Surgical pcr screen     Status: None   Collection Time: 03/17/20 10:15 PM   Specimen: Nasal Mucosa; Nasal Swab  Result Value Ref Range Status   MRSA, PCR NEGATIVE NEGATIVE Final   Staphylococcus aureus NEGATIVE NEGATIVE Final    Comment: (NOTE) The Xpert SA Assay (FDA approved for NASAL specimens in patients 38 years of age and older), is one component of a comprehensive surveillance program. It is not intended to diagnose infection nor to guide or monitor treatment. Performed at [facility], [place] [address] [place], [place] [address]          Radiology Studies: No results found.      Scheduled Meds: . acetaminophen  1,000 mg Oral TID  . calcitonin (salmon)  1 spray Alternating Nares Daily  . carbidopa-levodopa  2.5 tablet Oral 3 times per day  . Carbidopa-Levodopa ER  1 tablet Oral QHS  . cholecalciferol  2,000 Units Oral q AM  . donepezil  5 mg Oral QHS  . DULoxetine  60 mg Oral QHS  . enoxaparin (LOVENOX) injection  40 mg Subcutaneous Q24H  . fludrocortisone  0.1 mg Oral q AM  . polyethylene glycol  17 g Oral Daily  . pravastatin  20 mg Oral QHS   Continuous Infusions:   LOS: 3 days    Time spent: 15 minutes    Edwin Dada, MD [facility] 03/20/2020, 10:10 PM     Please page though [place] or Epic secure chat:  For [employer], Adult nurse

## 2020-03-21 DIAGNOSIS — S32030D Wedge compression fracture of third lumbar vertebra, subsequent encounter for fracture with routine healing: Secondary | ICD-10-CM

## 2020-03-21 DIAGNOSIS — G20 Parkinson's disease: Secondary | ICD-10-CM

## 2020-03-21 MED ORDER — OXYCODONE HCL 5 MG PO TABS: 2.5000 mg | ORAL_TABLET | Freq: Four times a day (QID) | ORAL | 0 refills | Status: DC | PRN

## 2020-03-21 NOTE — Plan of Care (Signed)

## 2020-03-21 NOTE — TOC Progression Note (Signed)
Transition of Care [facility]) - Progression Note    Patient Details  Name: Alex Frazier MRN: [id number] Date of Birth: [birth year]  [facility] CM/SW Contact  Sharin Mons, RN Phone Number: 03/21/2020, 9:35 AM  Clinical Narrative:     Accepted and selected [facility] for SNF placement. Awaiting PASRR.... TOC team will continue to monitor and follow.  Expected Discharge Plan: [place] ([facility]) Barriers to Discharge: [facility])  Expected Discharge Plan and Services Expected Discharge Plan: [place] ([facility])                                               Social Determinants of Health (SDOH) Interventions    Readmission Risk Interventions No flowsheet data found.

## 2020-03-21 NOTE — Progress Notes (Signed)
PROGRESS NOTE    Alex Frazier  WLN:[id number] DOB: December 27, 1945 DOA: 03/17/2020 PCP: Susy Frizzle, MD    Brief Narrative:  Alex Frazier is a 74 y.o. M with hx dementia, home dwelling, ambulatory with walker, Parkinson's disease, and hx TIA who presented with fall and new back pain.  In the ER, CT head, c-spine, maxilla, chest and abdomen showed no injuries or abnormalities other than new L3 and L5 vertebral compression fractures.  The patient was in severe pain, and so the hospitalist service were asked to place in observation for therapy recommendations.   Assessment & Plan:   Principal Problem:   Lumbar compression fracture (HCC) Active Problems:   Parkinson's disease (HCC)   Hyperlipidemia   Pulmonary nodule  Acute vertebral body compression fracture, L3 and L5 He had a mild L1 comp fx in Feb this year, treated with Tylenol and ibuprofen at home.  Patient's pain is unchanged -TLSO brace for comfort  At discharge: -Schedule acetaminophen 1000 mg at 7 AM, 1 PM, and 7 PM (with Sinemet) -Continue calcitonin nasal spray once daily for 2 weeks -Continue oxycodone 2.5 mg as needed for severe pain which the patient has tolerated well here  -Check blood pressure daily and resume midodrine when systolic blood pressure drops below 120   Parkinson's disease -Continue Sinemet -Continue fludrocortisone for autonomic dysfunction -Midodrine is not needed blood pressures adequate on ambulation.   Dementia Mentation at baseline -Continue donepezil -Resume duloxetine at discharge  History TIA -Continue pravastatin   DVT prophylaxis: enoxaparin (LOVENOX) injection 40 mg Start: 03/17/20 2000   Code Status: full code  Family Communication: none today  Disposition Plan: Status is: Inpatient  Remains inpatient appropriate because:Unsafe d/c plan   Dispo:  Patient From: Home  Planned Disposition: [place]  Expected discharge date: 03/21/20  Medically  stable for discharge: Yes          Consultants:   None   Procedures:   None   Antimicrobials:   None     Subjective: Patient was seen and examined.  No overnight events.  Right rib hurts when he tries to move.  No other overnight events.  He knows he is going to rehab.  Objective: Vitals:   03/20/20 2158 03/21/20 0500 03/21/20 0601 03/21/20 0755  BP: 128/69  (!) 157/78 (!) 147/77  Pulse: 82  73 77  Resp: 18   18  Temp: 98.3 F (36.8 C)  98.2 F (36.8 C) 98.2 F (36.8 C)  TempSrc: Oral  Oral Oral  SpO2: 95%  95% 95%  Weight:  67 kg    Height:        Intake/Output Summary (Last 24 hours) at 03/21/2020 1109 Last data filed at 03/20/2020 1900 Gross per 24 hour  Intake 240 ml  Output 400 ml  Net -160 ml   Filed Weights   03/17/20 1559 03/21/20 0500  Weight: 59 kg 67 kg    Examination:  General exam: Appears calm and comfortable , looks appropriate for his age. Respiratory system: Clear to auscultation. Respiratory effort normal. Has some diffuse tenderness along the right lateral chest wall, however no palpable fracture or swelling. Cardiovascular system: S1 & S2 heard, RRR.  Gastrointestinal system: Abdomen is nondistended, soft and nontender.  Central nervous system: Alert and oriented. No focal neurological deficits. Psychiatry: Judgement and insight appear normal. Mood & affect appropriate.     Data Reviewed: I have personally reviewed following labs and imaging studies  CBC: Recent Labs  Lab 03/17/20 1606 03/17/20 1608 03/18/20 0316  WBC 8.6  --  7.4  HGB 13.7 13.6 12.3*  HCT 41.5 40.0 37.8*  MCV 89.1  --  88.5  PLT 242  --  716   Basic Metabolic Panel: Recent Labs  Lab 03/17/20 1606 03/17/20 1608 03/18/20 0316  NA 139 140 137  K 3.8 3.9 4.1  CL 104 101 102  CO2 27  --  28  GLUCOSE 110* 104* 116*  BUN 18 20 18   CREATININE 0.91 0.90 0.80  CALCIUM 9.3  --  8.9  MG 1.9  --   --   PHOS 3.3  --   --    GFR: Estimated Creatinine  Clearance: 76.9 mL/min (by C-G formula based on SCr of 0.8 mg/dL). Liver Function Tests: Recent Labs  Lab 03/18/20 0316  AST 33  ALT 10  ALKPHOS 96  BILITOT 0.4  PROT 5.9*  ALBUMIN 3.3*   No results for input(s): LIPASE, AMYLASE in the last 168 hours. No results for input(s): AMMONIA in the last 168 hours. Coagulation Profile: No results for input(s): INR, PROTIME in the last 168 hours. Cardiac Enzymes: No results for input(s): CKTOTAL, CKMB, CKMBINDEX, TROPONINI in the last 168 hours. BNP (last 3 results) No results for input(s): PROBNP in the last 8760 hours. HbA1C: No results for input(s): HGBA1C in the last 72 hours. CBG: No results for input(s): GLUCAP in the last 168 hours. Lipid Profile: No results for input(s): CHOL, HDL, LDLCALC, TRIG, CHOLHDL, LDLDIRECT in the last 72 hours. Thyroid Function Tests: No results for input(s): TSH, T4TOTAL, FREET4, T3FREE, THYROIDAB in the last 72 hours. Anemia Panel: No results for input(s): VITAMINB12, FOLATE, FERRITIN, TIBC, IRON, RETICCTPCT in the last 72 hours. Sepsis Labs: No results for input(s): PROCALCITON, LATICACIDVEN in the last 168 hours.  Recent Results (from the past 240 hour(s))  Urine Culture     Status: Abnormal   Collection Time: 03/17/20  5:31 PM   Specimen: Urine, Random  Result Value Ref Range Status   Specimen Description URINE, RANDOM  Final   Special Requests NONE  Final   Culture (A)  Final    <10,000 COLONIES/mL INSIGNIFICANT GROWTH Performed at [facility], [address] [place],  [address]    Report Status 03/18/2020 FINAL  Final  SARS Coronavirus 2 by RT PCR (hospital order, performed in [facility] hospital lab) Nasopharyngeal Nasopharyngeal Swab     Status: None   Collection Time: 03/17/20  6:05 PM   Specimen: Nasopharyngeal Swab  Result Value Ref Range Status   SARS Coronavirus 2 NEGATIVE NEGATIVE Final    Comment: (NOTE) SARS-CoV-2 target nucleic acids are NOT DETECTED.  The  SARS-CoV-2 RNA is generally detectable in upper and lower respiratory specimens during the acute phase of infection. The lowest concentration of SARS-CoV-2 viral copies this assay can detect is 250 copies / mL. A negative result does not preclude SARS-CoV-2 infection and should not be used as the sole basis for treatment or other patient management decisions.  A negative result may occur with improper specimen collection / handling, submission of specimen other than nasopharyngeal swab, presence of viral mutation(s) within the areas targeted by this assay, and inadequate number of viral copies (<250 copies / mL). A negative result must be combined with clinical observations, patient history, and epidemiological information.  Fact Sheet for Patients:   StrictlyIdeas.no  Fact Sheet for Healthcare Providers: BankingDealers.co.za  This test is not yet approved or  cleared by  the [employer] and has been authorized for detection and/or diagnosis of SARS-CoV-2 by FDA under an Emergency Use Authorization (EUA).  This EUA will remain in effect (meaning this test can be used) for the duration of the COVID-19 declaration under Section 564(b)(1) of the Act, 21 U.S.C. section 360bbb-3(b)(1), unless the authorization is terminated or revoked sooner.  Performed at [facility], [place] [address], [place], [place] [address]   Surgical pcr screen     Status: None   Collection Time: 03/17/20 10:15 PM   Specimen: Nasal Mucosa; Nasal Swab  Result Value Ref Range Status   MRSA, PCR NEGATIVE NEGATIVE Final   Staphylococcus aureus NEGATIVE NEGATIVE Final    Comment: (NOTE) The Xpert SA Assay (FDA approved for NASAL specimens in patients 11 years of age and older), is one component of a comprehensive surveillance program. It is not intended to diagnose infection nor to guide or monitor treatment. Performed at [facility], [place] [address], [place],  [address]          Radiology Studies: No results found.      Scheduled Meds:  acetaminophen  1,000 mg Oral TID   calcitonin (salmon)  1 spray Alternating Nares Daily   carbidopa-levodopa  2.5 tablet Oral 3 times per day   Carbidopa-Levodopa ER  1 tablet Oral QHS   cholecalciferol  2,000 Units Oral q AM   donepezil  5 mg Oral QHS   DULoxetine  60 mg Oral QHS   enoxaparin (LOVENOX) injection  40 mg Subcutaneous Q24H   fludrocortisone  0.1 mg Oral q AM   polyethylene glycol  17 g Oral Daily   pravastatin  20 mg Oral QHS   Continuous Infusions:   LOS: 4 days    Time spent: 30 minutes    Barb Merino, MD [facility] Pager [phone]

## 2020-03-21 NOTE — Progress Notes (Signed)
Physical Therapy Treatment Patient Details Name: Alex Frazier MRN: [id number] DOB: 1946/07/30 Today's Date: 03/21/2020    History of Present Illness Alex Frazier is a 74 y.o. male with medical history significant of Parkinson's disease, hyperlipidemia, depression, degenerative disc disease, B12 deficiency, and hypertension who presents to emergency department for evaluation after falling 5 feet off of his porch onto dirt below. CT chest/abdomen pelvis revealed an acute appearing mild compression fracture at L3 and L5.  Chronic L1 vertebral body fracture with progressive height loss since prior study on 10/16/2019.    PT Comments    Pt was seen for mobility on no AD, but is requiring direct help to successfully stand.  Pt is in less pain wearing TLSO. But is not able to don and is poorly fitted.  Pt and wife agreed to have brace shop come back and refit the TLSO.  In the meantime, brace will go on but just doesn't cover the pt correctly to fit in optimal way.  Follow acutely to move pt to stand and step as tolerated.  Follow Up Recommendations  SNF     Equipment Recommendations  None recommended by PT    Recommendations for Other Services       Precautions / Restrictions Precautions Precautions: Back Precaution Booklet Issued: No Precaution Comments: application of spinal brace a challenge due to fit Required Braces or Orthoses: Spinal Brace Spinal Brace: Thoracolumbosacral orthotic Restrictions Weight Bearing Restrictions: No Other Position/Activity Restrictions: general spinal precautions    Mobility  Bed Mobility Overal bed mobility: Needs Assistance Bed Mobility: Supine to Sit;Sit to Supine Rolling: Mod assist Sidelying to sit: Mod assist Supine to sit: Max assist Sit to supine: Max assist Sit to sidelying: Mod assist    Transfers Overall transfer level: Needs assistance Equipment used: Rolling walker (2 wheeled);1 person hand held assist Transfers: Sit to/from  Stand Sit to Stand: Max assist         General transfer comment: max assist to control staning balance, with RW intially then direct assist  Ambulation/Gait             General Gait Details: unable to progress gait due to pain limiting    Stairs             Wheelchair Mobility    Modified Rankin (Stroke Patients Only)       Balance Overall balance assessment: Needs assistance Sitting-balance support: Bilateral upper extremity supported;Feet supported Sitting balance-Leahy Scale: Poor Sitting balance - Comments: min to mod assist needed     Standing balance-Leahy Scale: Poor Standing balance comment: Unable to come to standing                            Cognition Arousal/Alertness: Awake/alert Behavior During Therapy: Flat affect Overall Cognitive Status: Impaired/Different from baseline Area of Impairment: Safety/judgement;Following commands;Awareness;Problem solving                       Following Commands: Follows one step commands inconsistently;Follows one step commands with increased time Safety/Judgement: Decreased awareness of safety;Decreased awareness of deficits Awareness: Intellectual Problem Solving: Slow processing;Requires verbal cues General Comments: Pt is able to stand and don brace in sitting but is requiring cued help for all mobility      Exercises      General Comments        Pertinent Vitals/Pain Pain Assessment: No/denies pain Pain Score: 0-No pain Faces Pain Scale: Hurts  whole lot Pain Location: R ribs Pain Descriptors / Indicators: Grimacing;Guarding Pain Intervention(s): Limited activity within patient's tolerance;Monitored during session;Premedicated before session;Repositioned;Other (comment) (TLSO applied)    Home Living                      Prior Function            PT Goals (current goals can now be found in the care plan section) Acute Rehab PT Goals Patient Stated Goal:  Rehab Progress towards PT goals: Progressing toward goals    Frequency    Min 3X/week      PT Plan Discharge plan needs to be updated    Co-evaluation              AM-PAC PT "6 Clicks" Mobility   Outcome Measure  Help needed turning from your back to your side while in a flat bed without using bedrails?: A Lot Help needed moving from lying on your back to sitting on the side of a flat bed without using bedrails?: A Lot Help needed moving to and from a bed to a chair (including a wheelchair)?: A Lot Help needed standing up from a chair using your arms (e.g., wheelchair or bedside chair)?: A Lot Help needed to walk in hospital room?: A Lot Help needed climbing 3-5 steps with a railing? : Total 6 Click Score: 11    End of Session Equipment Utilized During Treatment: Gait belt;Back brace Activity Tolerance: Patient limited by pain;Patient limited by fatigue Patient left: in bed;with call bell/phone within reach;with family/visitor present Nurse Communication: Mobility status PT Visit Diagnosis: Pain;Muscle weakness (generalized) (M62.81);Difficulty in walking, not elsewhere classified (R26.2);Other symptoms and signs involving the nervous system (R29.898) Pain - Right/Left:  (spine) Pain - part of body:  (spine)     Time: 1240-1306 PT Time Calculation (min) (ACUTE ONLY): 26 min  Charges:  $Therapeutic Activity: 23-37 mins                    Ramond Dial 03/21/2020, 10:00 PM  Mee Hives, PT MS Acute Rehab Dept. Number: [facility] [place]

## 2020-03-22 ENCOUNTER — Ambulatory Visit: Payer: Medicare Other | Admitting: Family Medicine

## 2020-03-22 DIAGNOSIS — G629 Polyneuropathy, unspecified: Secondary | ICD-10-CM | POA: Diagnosis not present

## 2020-03-22 DIAGNOSIS — R52 Pain, unspecified: Secondary | ICD-10-CM | POA: Diagnosis not present

## 2020-03-22 DIAGNOSIS — M50922 Unspecified cervical disc disorder at C5-C6 level: Secondary | ICD-10-CM | POA: Diagnosis not present

## 2020-03-22 DIAGNOSIS — Z7401 Bed confinement status: Secondary | ICD-10-CM | POA: Diagnosis not present

## 2020-03-22 DIAGNOSIS — E785 Hyperlipidemia, unspecified: Secondary | ICD-10-CM | POA: Diagnosis not present

## 2020-03-22 DIAGNOSIS — M5489 Other dorsalgia: Secondary | ICD-10-CM | POA: Diagnosis not present

## 2020-03-22 DIAGNOSIS — F0280 Dementia in other diseases classified elsewhere without behavioral disturbance: Secondary | ICD-10-CM | POA: Diagnosis not present

## 2020-03-22 DIAGNOSIS — F29 Unspecified psychosis not due to a substance or known physiological condition: Secondary | ICD-10-CM | POA: Diagnosis not present

## 2020-03-22 DIAGNOSIS — F0390 Unspecified dementia without behavioral disturbance: Secondary | ICD-10-CM | POA: Diagnosis not present

## 2020-03-22 DIAGNOSIS — S32038D Other fracture of third lumbar vertebra, subsequent encounter for fracture with routine healing: Secondary | ICD-10-CM | POA: Diagnosis not present

## 2020-03-22 DIAGNOSIS — W19XXXD Unspecified fall, subsequent encounter: Secondary | ICD-10-CM | POA: Diagnosis not present

## 2020-03-22 DIAGNOSIS — F329 Major depressive disorder, single episode, unspecified: Secondary | ICD-10-CM | POA: Diagnosis not present

## 2020-03-22 DIAGNOSIS — S32058D Other fracture of fifth lumbar vertebra, subsequent encounter for fracture with routine healing: Secondary | ICD-10-CM | POA: Diagnosis not present

## 2020-03-22 DIAGNOSIS — S32019D Unspecified fracture of first lumbar vertebra, subsequent encounter for fracture with routine healing: Secondary | ICD-10-CM | POA: Diagnosis not present

## 2020-03-22 DIAGNOSIS — E538 Deficiency of other specified B group vitamins: Secondary | ICD-10-CM | POA: Diagnosis not present

## 2020-03-22 DIAGNOSIS — G20 Parkinson's disease: Secondary | ICD-10-CM | POA: Diagnosis not present

## 2020-03-22 DIAGNOSIS — M2550 Pain in unspecified joint: Secondary | ICD-10-CM | POA: Diagnosis not present

## 2020-03-22 DIAGNOSIS — Z8673 Personal history of transient ischemic attack (TIA), and cerebral infarction without residual deficits: Secondary | ICD-10-CM | POA: Diagnosis not present

## 2020-03-22 DIAGNOSIS — M8000XD Age-related osteoporosis with current pathological fracture, unspecified site, subsequent encounter for fracture with routine healing: Secondary | ICD-10-CM | POA: Diagnosis not present

## 2020-03-22 DIAGNOSIS — M47817 Spondylosis without myelopathy or radiculopathy, lumbosacral region: Secondary | ICD-10-CM | POA: Diagnosis not present

## 2020-03-22 DIAGNOSIS — S32030D Wedge compression fracture of third lumbar vertebra, subsequent encounter for fracture with routine healing: Secondary | ICD-10-CM | POA: Diagnosis not present

## 2020-03-22 LAB — SARS CORONAVIRUS 2 (TAT 6-24 HRS): SARS Coronavirus 2: NEGATIVE

## 2020-03-22 MED ORDER — ACETAMINOPHEN 500 MG PO TABS: 1000.0000 mg | ORAL_TABLET | Freq: Three times a day (TID) | ORAL | 0 refills | Status: DC

## 2020-03-22 MED ORDER — OXYCODONE HCL 5 MG PO TABS: 2.5000 mg | ORAL_TABLET | Freq: Four times a day (QID) | ORAL | 0 refills | Status: AC | PRN

## 2020-03-22 NOTE — NC FL2 (Addendum)
[place] LEVEL OF CARE SCREENING TOOL     IDENTIFICATION  Patient Name: Alex Frazier Birthdate: 01-23-1946 Sex: male Admission Date (Current Location): 03/17/2020  [facility] and [place] Number:  Herbalist and Address:  [facility], [place] [address] [place],  [address]      Provider Number: [id number]  Attending Physician Name and Address:  Barb Merino, MD  Relative Name and Phone Number:       Current Level of Care: Hospital Recommended Level of Care: [place] Prior Approval Number:    Date Approved/Denied:   PASRR Number:  [id number] Discharge Plan: SNF    Current Diagnoses: Patient Active Problem List   Diagnosis Date Noted  . Pulmonary nodule   . Lumbar compression fracture ([place]) 10/26/2019  . DDD (degenerative disc disease), cervical 04/07/2016  . Neuropathy   . Bulging discs   . Gastritis   . Parkinson's disease ([place])   . Hyperlipidemia     Orientation RESPIRATION BLADDER Height & Weight     Self, Situation, Place, Time  Normal External catheter Weight: 67 kg Height:  5\' 7"  (170.2 cm)  BEHAVIORAL SYMPTOMS/MOOD NEUROLOGICAL BOWEL NUTRITION STATUS      Continent Diet (refer to d/c summary)  AMBULATORY STATUS COMMUNICATION OF NEEDS Skin   Extensive Assist Verbally Normal                       Personal Care Assistance Level of Assistance  Bathing, Feeding, Dressing Bathing Assistance: Maximum assistance Feeding assistance: Limited assistance Dressing Assistance: Maximum assistance     Functional Limitations Info  Sight, Speech, Hearing Sight Info: Adequate Hearing Info: Adequate Speech Info: Adequate    SPECIAL CARE FACTORS FREQUENCY  PT (By licensed PT), OT (By licensed OT)     PT Frequency: 5x/week , evaluate and treat OT Frequency: 5x/week , evaluate and treat            Contractures Contractures Info: Not present    Additional Factors Info  Allergies,  Code Status Code Status Info: Full Code Allergies Info: Tape, Hydrocodone-acetaminophen, Indomethacin, Lyrica Pregabalin, Quetiapine           Current Medications (03/22/2020):  This is the current hospital active medication list Current Facility-Administered Medications  Medication Dose Route Frequency Provider Last Rate Last Admin  . acetaminophen (TYLENOL) tablet 1,000 mg  1,000 mg Oral TID Edwin Dada, MD   1,000 mg at 03/22/20 0828  . calcitonin (salmon) (MIACALCIN/FORTICAL) nasal spray 1 spray  1 spray Alternating Nares Daily Danford, Suann Larry, MD   1 spray at 03/22/20 0833  . carbidopa-levodopa (SINEMET IR) 25-100 MG per tablet immediate release 2.5 tablet  2.5 tablet Oral 3 times per day Mckinley Jewel, MD   2.5 tablet at 03/22/20 0828  . Carbidopa-Levodopa ER (SINEMET CR) 25-100 MG tablet controlled release 1 tablet  1 tablet Oral QHS Pahwani, Rinka R, MD   1 tablet at 03/21/20 2245  . cholecalciferol (VITAMIN D3) tablet 2,000 Units  2,000 Units Oral q AM Pahwani, Rinka R, MD   2,000 Units at 03/22/20 0832  . donepezil (ARICEPT) tablet 5 mg  5 mg Oral QHS Pahwani, Rinka R, MD   5 mg at 03/21/20 2246  . DULoxetine (CYMBALTA) DR capsule 60 mg  60 mg Oral QHS Pahwani, Rinka R, MD   60 mg at 03/21/20 2246  . enoxaparin (LOVENOX) injection 40 mg  40 mg Subcutaneous Q24H Pahwani,  Michell Heinrich, MD   40 mg at 03/21/20 2247  . fludrocortisone (FLORINEF) tablet 0.1 mg  0.1 mg Oral q AM Pahwani, Rinka R, MD   0.1 mg at 03/22/20 0832  . ondansetron (ZOFRAN) tablet 4 mg  4 mg Oral Q6H PRN Pahwani, Rinka R, MD       Or  . ondansetron (ZOFRAN) injection 4 mg  4 mg Intravenous Q6H PRN Pahwani, Rinka R, MD      . oxyCODONE (Oxy IR/ROXICODONE) immediate release tablet 2.5 mg  2.5 mg Oral Q6H PRN Edwin Dada, MD   2.5 mg at 03/20/20 1828  . polyethylene glycol (MIRALAX / GLYCOLAX) packet 17 g  17 g Oral Daily Pahwani, Rinka R, MD   17 g at 03/22/20 0826  . pravastatin (PRAVACHOL)  tablet 20 mg  20 mg Oral QHS Pahwani, Rinka R, MD   20 mg at 03/21/20 2245     Discharge Medications: Please see discharge summary for a list of discharge medications.  Relevant Imaging Results:  Relevant Lab Results:   Additional Information SS# [id number]  Sharin Mons, RN

## 2020-03-22 NOTE — Care Management Important Message (Signed)
Important Message  Patient Details  Name: Alex Frazier MRN: [id number] Date of Birth: 1946-06-08   Medicare Important Message Given:  Yes Patient left prior to IM delivery.  IM mailed to the patient address    Orbie Pyo 03/22/2020, 3:24 PM

## 2020-03-22 NOTE — TOC Transition Note (Signed)
Transition of Care [facility]) - CM/SW Discharge Note   Patient Details  Name: Alex Frazier MRN: [id number] Date of Birth: 09-19-1945  Transition [facility]) CM/SW Contact:  Sharin Mons, RN Phone Number: 03/22/2020, 12:50 PM   Clinical Narrative:    Patient will DC to: [facility] Anticipated DC date: 03/22/2020 Family notified: wife Transport by: Corey Harold   Per MD patient ready for DC today . RN, patient, patient's family, and facility notified of DC. Discharge Summary and FL2 sent to facility. RN to call report prior to discharge [phone]). DC packet on chart. Ambulance transport requested for patient.   NCM will sign off for now as intervention is no longer needed. Please consult [place] again if new needs arise.    Final next level of care: [place] ([facility]) Barriers to Discharge: No Barriers Identified   Patient Goals and CMS Choice        Discharge Placement                       Discharge Plan and Services                                     Social Determinants of Health (SDOH) Interventions     Readmission Risk Interventions No flowsheet data found.

## 2020-03-22 NOTE — Progress Notes (Signed)
Called facility and gave report to [place], all questions and concerns addressed, pt not in distress, discharged to facility via PTAR, wife at bedside updated.

## 2020-03-22 NOTE — Plan of Care (Signed)

## 2020-03-22 NOTE — Discharge Summary (Signed)
Physician Discharge Summary  Alex Frazier POE:[id number] DOB: [birth year] DOA: 03/17/2020  PCP: Susy Frizzle, MD  Admit date: 03/17/2020 Discharge date: 03/22/2020  Admitted From: home  Disposition:  SNF  Recommendations for Outpatient Follow-up:  1. Follow up with PCP in 1-2 weeks   Discharge Condition:stable   CODE STATUS: full code  Diet recommendation: low salt diet   Discharge Summary: [name] is a74 y.o.M with hx dementia, home dwelling, ambulatory with walker, Parkinson's disease, and hx TIA who presented with fall and new back pain.  In the ER, CT head, c-spine, maxilla, chest and abdomen showed no injuries or abnormalities other than new L3 and L5 vertebral compression fractures. The patient was in severe pain, and so the hospitalist service were asked to place in observation for therapy recommendations.   Assessment & Plan of care :     Acute vertebral body compression fracture, L3 and L5 He had a mild L1 comp fx in Feb this year, treated with Tylenol and ibuprofen at home. Patient's pain is unchanged -TLSO brace for comfort -To wear brace on ambulation, no need to wear at rest.  Pain medications, -Schedule acetaminophen 1000 mg at 7 AM, 1 PM, and 7 PM (with Sinemet) -Continue oxycodone 2.5 mg as needed for severe pain which the patient has tolerated well here  -Check blood pressure daily and resume midodrine when systolic blood pressure drops below 120 -He does have significant orthostatic changes, remains on Florinef, add midodrine if symptomatic.  Parkinson's disease -Continue Sinemet -Continue fludrocortisonefor autonomic dysfunction -Midodrine as needed.  Add for any symptomatic drop in blood pressure or dizziness.  Dementia Mentation at baseline -Continue donepezil -Resume duloxetine at discharge  History TIA -Continue pravastatin  Patient is stable to transfer to skilled level of care.  Discharge Diagnoses:  Principal  Problem:   Lumbar compression fracture ([place]) Active Problems:   Parkinson's disease ([place])   Hyperlipidemia   Pulmonary nodule    Discharge Instructions  Discharge Instructions    Diet - low sodium heart healthy   Complete by: As directed    Increase activity slowly   Complete by: As directed      Allergies as of 03/22/2020      Reactions   Tape Other (See Comments)   SKIN IS VERY THIN- TEARS VERY EASILY!!   Hydrocodone-acetaminophen Other (See Comments)   Hallucinations and confusion   Indomethacin Other (See Comments)   Unknown reaction   Lyrica [pregabalin] Other (See Comments)   Vivid hallucinations   Quetiapine Other (See Comments)   HYPOtension!!      Medication List    STOP taking these medications   Droxidopa 100 MG Caps   fluticasone 50 MCG/ACT nasal spray Commonly known as: FLONASE   ibuprofen 200 MG tablet Commonly known as: ADVIL   naproxen sodium 220 MG tablet Commonly known as: ALEVE   QUEtiapine 50 MG tablet Commonly known as: SEROquel   sulfamethoxazole-trimethoprim 800-160 MG tablet Commonly known as: BACTRIM DS     TAKE these medications   acetaminophen 500 MG tablet Commonly known as: TYLENOL Take 2 tablets (1,000 mg total) by mouth 3 (three) times daily.   Biofreeze 4 % Gel Generic drug: Menthol (Topical Analgesic) Apply 1 application topically See admin instructions. Apply topically to painful sites as directed by MD   Carbidopa-Levodopa ER 25-100 MG tablet controlled release Commonly known as: SINEMET CR Take 1 tablet by mouth at bedtime.   carbidopa-levodopa 25-100 MG tablet Commonly known as: SINEMET IR  Take 2.5 tablets by mouth See admin instructions. Take 2.5 tablets by mouth three times daily- 7 AM, 1 PM, and 7 PM   cholecalciferol 25 MCG (1000 UNIT) tablet Commonly known as: VITAMIN D3 Take 2,000 Units by mouth in the morning.   donepezil 5 MG tablet Commonly known as: ARICEPT Take 5 mg by mouth at bedtime.    DULoxetine 60 MG capsule Commonly known as: CYMBALTA Take 1 capsule (60 mg total) by mouth daily. What changed: when to take this   fludrocortisone 0.1 MG tablet Commonly known as: FLORINEF TAKE 2 TABLETS BY MOUTH DAILY. GENERIC EQUIVALENT FOR FLORINEF. What changed: See the new instructions.   midodrine 10 MG tablet Commonly known as: PROAMATINE Take 1 tablet (10 mg total) by mouth 3 (three) times daily. What changed:   when to take this  additional instructions   oxyCODONE 5 MG immediate release tablet Commonly known as: Oxy IR/ROXICODONE Take 0.5 tablets (2.5 mg total) by mouth every 6 (six) hours as needed for up to 5 days for severe pain.   polyethylene glycol powder 17 GM/SCOOP powder Commonly known as: GLYCOLAX/MIRALAX Take 17 g by mouth See admin instructions. Mix 17 grams of powder into 4-8 ounces of prune juice and drink once a day   pravastatin 20 MG tablet Commonly known as: PRAVACHOL TAKE 1 TABLET BY MOUTH DAILY AT 6 PM GENERIC EQUIVALENT FOR PRAVACHOL What changed:   how much to take  how to take this  when to take this  additional instructions   VITAMIN B 12 PO Take 1 tablet by mouth in the morning.       Contact information for after-discharge care    [facility] .   Service: Skilled Nursing Contact information: [address] [phone]                 Allergies  Allergen Reactions  . Tape Other (See Comments)    SKIN IS VERY THIN- TEARS VERY EASILY!!  . Hydrocodone-Acetaminophen Other (See Comments)    Hallucinations and confusion  . Indomethacin Other (See Comments)    Unknown reaction  . Lyrica [Pregabalin] Other (See Comments)    Vivid hallucinations  . Quetiapine Other (See Comments)    HYPOtension!!    Consultations:  Neurosurgery,   Procedures/Studies: CT Head Wo Contrast  Result Date: 03/17/2020 CLINICAL DATA:  Fell 5 feet,  hypotension EXAM: CT HEAD WITHOUT CONTRAST CT MAXILLOFACIAL WITHOUT CONTRAST CT CERVICAL SPINE WITHOUT CONTRAST TECHNIQUE: Multidetector CT imaging of the head, cervical spine, and maxillofacial structures were performed using the standard protocol without intravenous contrast. Multiplanar CT image reconstructions of the cervical spine and maxillofacial structures were also generated. COMPARISON:  10/28/2019, 07/26/2019. FINDINGS: CT HEAD FINDINGS Brain: Scattered hypodensities within the periventricular white matter consistent with stable chronic small vessel ischemic change. No signs of acute infarct or hemorrhage. Lateral ventricles and remaining midline structures are unremarkable. No acute extra-axial fluid collections. No mass effect. Vascular: No hyperdense vessel or unexpected calcification. Skull: Normal. Negative for fracture or focal lesion. Other: None. CT MAXILLOFACIAL FINDINGS Osseous: No fracture or mandibular dislocation. No destructive process. Orbits: Negative. No traumatic or inflammatory finding. Sinuses: Clear. Soft tissues: Negative. CT CERVICAL SPINE FINDINGS Alignment: Alignment is anatomic. Skull base and vertebrae: No acute displaced fractures. Soft tissues and spinal canal: No prevertebral fluid or swelling. No visible canal hematoma. Disc levels: Mild spondylosis is seen at the C5/C6 level, with symmetrical neural  foraminal encroachment. There is mild right predominant disc osteophyte complex and facet hypertrophy at C3/C4 with right-sided neural foraminal encroachment. Upper chest: Airway is patent. Lung apices are clear. Other: Reconstructed images demonstrate no additional findings. IMPRESSION: 1. Stable chronic small vessel ischemic changes. No acute intracranial process. 2. No acute facial bone fractures. 3. No acute cervical spine fracture. Electronically Signed   By: Randa Ngo M.D.   On: 03/17/2020 16:46   CT Chest W Contrast  Result Date: 03/17/2020 CLINICAL DATA:  Acute  pain status post fall. Rib cage pain. Hypotensive. EXAM: CT CHEST, ABDOMEN, AND PELVIS WITH CONTRAST TECHNIQUE: Multidetector CT imaging of the chest, abdomen and pelvis was performed following the standard protocol during bolus administration of intravenous contrast. CONTRAST:  138mL OMNIPAQUE IOHEXOL 300 MG/ML  SOLN COMPARISON:  10/16/2019 FINDINGS: CT CHEST FINDINGS Cardiovascular: The heart size is normal. There are atherosclerotic changes of the thoracic aorta without evidence for dissection or aneurysm. There is no large centrally located pulmonary embolism. Mediastinum/Nodes: -- No mediastinal lymphadenopathy. -- No hilar lymphadenopathy. -- No axillary lymphadenopathy. -- No supraclavicular lymphadenopathy. -- Normal thyroid gland where visualized. -the esophagus is dilated and fluid-filled to the upper thorax. Lungs/Pleura: There is a pulmonary nodule in the left upper lobe measuring approximately 3 mm (axial series 6, image 23). There is atelectasis at the lung bases. There is no pneumothorax. Musculoskeletal: No chest wall abnormality. No bony spinal canal stenosis. CT ABDOMEN PELVIS FINDINGS Hepatobiliary: The liver is normal. Normal gallbladder.There is no biliary ductal dilation. Pancreas: Normal contours without ductal dilatation. No peripancreatic fluid collection. Spleen: Unremarkable. Adrenals/Urinary Tract: --Adrenal glands: Unremarkable. --Right kidney/ureter: No hydronephrosis or radiopaque kidney stones. --Left kidney/ureter: No hydronephrosis or radiopaque kidney stones. --Urinary bladder: There is mild diffuse bladder wall thickening. Stomach/Bowel: --Stomach/Duodenum: No hiatal hernia or other gastric abnormality. Normal duodenal course and caliber. --Small bowel: Unremarkable. --Colon: There is a large amount of stool in the colon. --Appendix: Not visualized. No right lower quadrant inflammation or free fluid. Vascular/Lymphatic: Atherosclerotic calcification is present within the  non-aneurysmal abdominal aorta, without hemodynamically significant stenosis. --No retroperitoneal lymphadenopathy. --No mesenteric lymphadenopathy. --No pelvic or inguinal lymphadenopathy. Reproductive: Unremarkable Other: No ascites or free air. The abdominal wall is normal. Musculoskeletal. There is an age-indeterminate severe compression fracture of the L1 vertebral body. There is a questionable mild compression fracture through the superior endplate of the L2 vertebral body. There are acute appearing mild compression fractures of the L3 and L5 vertebral bodies. There is a subtle superior endplate deformity of the T12 vertebral body (coronal series 8, image 63). This is stable since the patient's prior lumbar CT and is favored to represent a normal variant or old compression fracture. IMPRESSION: 1. Acute appearing mild compression fractures of the L3 and L5 vertebral bodies. Chronic L1 vertebral body fracture with progressive height loss since prior study dated 10/16/2019. There is a questionable mild compression fracture through the superior endplate of the L2 vertebral body. Large amount of stool in the colon. 2. Mild diffuse bladder wall thickening. Correlate with urinalysis to exclude cystitis. 3. A 3 mm pulmonary nodule in the left upper lobe. No follow-up needed if patient is low-risk. Non-contrast chest CT can be considered in 12 months if patient is high-risk. This recommendation follows the consensus statement: Guidelines for Management of Incidental Pulmonary Nodules Detected on CT Images: From the Fleischner Society 2017; Radiology 2017; 284:228-243. Aortic Atherosclerosis (ICD10-I70.0). Electronically Signed   By: Constance Holster M.D.   On: 03/17/2020 16:56  CT Cervical Spine Wo Contrast  Result Date: 03/17/2020 CLINICAL DATA:  [address] [address] 5 feet, hypotension EXAM: CT HEAD WITHOUT CONTRAST CT MAXILLOFACIAL WITHOUT CONTRAST CT CERVICAL SPINE WITHOUT CONTRAST TECHNIQUE: Multidetector CT imaging of  the head, cervical spine, and maxillofacial structures were performed using the standard protocol without intravenous contrast. Multiplanar CT image reconstructions of the cervical spine and maxillofacial structures were also generated. COMPARISON:  10/28/2019, 07/26/2019. FINDINGS: CT HEAD FINDINGS Brain: Scattered hypodensities within the periventricular white matter consistent with stable chronic small vessel ischemic change. No signs of acute infarct or hemorrhage. Lateral ventricles and remaining midline structures are unremarkable. No acute extra-axial fluid collections. No mass effect. Vascular: No hyperdense vessel or unexpected calcification. Skull: Normal. Negative for fracture or focal lesion. Other: None. CT MAXILLOFACIAL FINDINGS Osseous: No fracture or mandibular dislocation. No destructive process. Orbits: Negative. No traumatic or inflammatory finding. Sinuses: Clear. Soft tissues: Negative. CT CERVICAL SPINE FINDINGS Alignment: Alignment is anatomic. Skull base and vertebrae: No acute displaced fractures. Soft tissues and spinal canal: No prevertebral fluid or swelling. No visible canal hematoma. Disc levels: Mild spondylosis is seen at the C5/C6 level, with symmetrical neural foraminal encroachment. There is mild right predominant disc osteophyte complex and facet hypertrophy at C3/C4 with right-sided neural foraminal encroachment. Upper chest: Airway is patent. Lung apices are clear. Other: Reconstructed images demonstrate no additional findings. IMPRESSION: 1. Stable chronic small vessel ischemic changes. No acute intracranial process. 2. No acute facial bone fractures. 3. No acute cervical spine fracture. Electronically Signed   By: Randa Ngo M.D.   On: 03/17/2020 16:46   CT Abdomen Pelvis W Contrast  Result Date: 03/17/2020 CLINICAL DATA:  Acute pain status post fall. Rib cage pain. Hypotensive. EXAM: CT CHEST, ABDOMEN, AND PELVIS WITH CONTRAST TECHNIQUE: Multidetector CT imaging of  the chest, abdomen and pelvis was performed following the standard protocol during bolus administration of intravenous contrast. CONTRAST:  160mL OMNIPAQUE IOHEXOL 300 MG/ML  SOLN COMPARISON:  10/16/2019 FINDINGS: CT CHEST FINDINGS Cardiovascular: The heart size is normal. There are atherosclerotic changes of the thoracic aorta without evidence for dissection or aneurysm. There is no large centrally located pulmonary embolism. Mediastinum/Nodes: -- No mediastinal lymphadenopathy. -- No hilar lymphadenopathy. -- No axillary lymphadenopathy. -- No supraclavicular lymphadenopathy. -- Normal thyroid gland where visualized. -the esophagus is dilated and fluid-filled to the upper thorax. Lungs/Pleura: There is a pulmonary nodule in the left upper lobe measuring approximately 3 mm (axial series 6, image 23). There is atelectasis at the lung bases. There is no pneumothorax. Musculoskeletal: No chest wall abnormality. No bony spinal canal stenosis. CT ABDOMEN PELVIS FINDINGS Hepatobiliary: The liver is normal. Normal gallbladder.There is no biliary ductal dilation. Pancreas: Normal contours without ductal dilatation. No peripancreatic fluid collection. Spleen: Unremarkable. Adrenals/Urinary Tract: --Adrenal glands: Unremarkable. --Right kidney/ureter: No hydronephrosis or radiopaque kidney stones. --Left kidney/ureter: No hydronephrosis or radiopaque kidney stones. --Urinary bladder: There is mild diffuse bladder wall thickening. Stomach/Bowel: --Stomach/Duodenum: No hiatal hernia or other gastric abnormality. Normal duodenal course and caliber. --Small bowel: Unremarkable. --Colon: There is a large amount of stool in the colon. --Appendix: Not visualized. No right lower quadrant inflammation or free fluid. Vascular/Lymphatic: Atherosclerotic calcification is present within the non-aneurysmal abdominal aorta, without hemodynamically significant stenosis. --No retroperitoneal lymphadenopathy. --No mesenteric lymphadenopathy.  --No pelvic or inguinal lymphadenopathy. Reproductive: Unremarkable Other: No ascites or free air. The abdominal wall is normal. Musculoskeletal. There is an age-indeterminate severe compression fracture of the L1 vertebral body. There is a questionable mild compression fracture through  the superior endplate of the L2 vertebral body. There are acute appearing mild compression fractures of the L3 and L5 vertebral bodies. There is a subtle superior endplate deformity of the T12 vertebral body (coronal series 8, image 63). This is stable since the patient's prior lumbar CT and is favored to represent a normal variant or old compression fracture. IMPRESSION: 1. Acute appearing mild compression fractures of the L3 and L5 vertebral bodies. Chronic L1 vertebral body fracture with progressive height loss since prior study dated 10/16/2019. There is a questionable mild compression fracture through the superior endplate of the L2 vertebral body. Large amount of stool in the colon. 2. Mild diffuse bladder wall thickening. Correlate with urinalysis to exclude cystitis. 3. A 3 mm pulmonary nodule in the left upper lobe. No follow-up needed if patient is low-risk. Non-contrast chest CT can be considered in 12 months if patient is high-risk. This recommendation follows the consensus statement: Guidelines for Management of Incidental Pulmonary Nodules Detected on CT Images: From the Fleischner Society 2017; Radiology 2017; 284:228-243. Aortic Atherosclerosis (ICD10-I70.0). Electronically Signed   By: Constance Holster M.D.   On: 03/17/2020 16:56   DG Pelvis Portable  Result Date: 03/17/2020 CLINICAL DATA:  [address] [address] from porch EXAM: PORTABLE PELVIS 1-2 VIEWS COMPARISON:  10/16/2019 FINDINGS: Supine frontal view of the pelvis was performed. There are no acute displaced fractures. Hips are well aligned. Sacroiliac joints are normal. Chronic spondylosis at the lumbosacral junction unchanged. IMPRESSION: 1. No acute pelvic fracture.  Electronically Signed   By: Randa Ngo M.D.   On: 03/17/2020 16:04   DG Chest Port 1 View  Result Date: 03/17/2020 CLINICAL DATA:  Fall from [place]: PORTABLE CHEST 1 VIEW COMPARISON:  August 11, 2016. FINDINGS: The heart size and mediastinal contours are within normal limits. Both lungs are clear. The visualized skeletal structures are unremarkable. IMPRESSION: No active disease. Electronically Signed   By: Constance Holster M.D.   On: 03/17/2020 16:02   CT Maxillofacial WO CM  Result Date: 03/17/2020 CLINICAL DATA:  [place] [address] 5 feet, hypotension EXAM: CT HEAD WITHOUT CONTRAST CT MAXILLOFACIAL WITHOUT CONTRAST CT CERVICAL SPINE WITHOUT CONTRAST TECHNIQUE: Multidetector CT imaging of the head, cervical spine, and maxillofacial structures were performed using the standard protocol without intravenous contrast. Multiplanar CT image reconstructions of the cervical spine and maxillofacial structures were also generated. COMPARISON:  10/28/2019, 07/26/2019. FINDINGS: CT HEAD FINDINGS Brain: Scattered hypodensities within the periventricular white matter consistent with stable chronic small vessel ischemic change. No signs of acute infarct or hemorrhage. Lateral ventricles and remaining midline structures are unremarkable. No acute extra-axial fluid collections. No mass effect. Vascular: No hyperdense vessel or unexpected calcification. Skull: Normal. Negative for fracture or focal lesion. Other: None. CT MAXILLOFACIAL FINDINGS Osseous: No fracture or mandibular dislocation. No destructive process. Orbits: Negative. No traumatic or inflammatory finding. Sinuses: Clear. Soft tissues: Negative. CT CERVICAL SPINE FINDINGS Alignment: Alignment is anatomic. Skull base and vertebrae: No acute displaced fractures. Soft tissues and spinal canal: No prevertebral fluid or swelling. No visible canal hematoma. Disc levels: Mild spondylosis is seen at the C5/C6 level, with symmetrical neural foraminal encroachment. There  is mild right predominant disc osteophyte complex and facet hypertrophy at C3/C4 with right-sided neural foraminal encroachment. Upper chest: Airway is patent. Lung apices are clear. Other: Reconstructed images demonstrate no additional findings. IMPRESSION: 1. Stable chronic small vessel ischemic changes. No acute intracranial process. 2. No acute facial bone fractures. 3. No acute cervical spine fracture. Electronically Signed   By: Legrand Como  Owens Shark M.D.   On: 03/17/2020 16:46    (Echo, Carotid, EGD, Colonoscopy, ERCP)    Subjective: Patient seen and examined.  Denies any complaints.  He has less pain today.  Wife at the bedside.  Comfortable.   Discharge Exam: Vitals:   03/22/20 0300 03/22/20 0934  BP: 130/75 138/70  Pulse: 84 74  Resp: 16 18  Temp: 98.1 F (36.7 C) 98.3 F (36.8 C)  SpO2: 96% 95%   Vitals:   03/21/20 1418 03/21/20 1924 03/22/20 0300 03/22/20 0934  BP: 110/64 128/70 130/75 138/70  Pulse: 78 86 84 74  Resp: 17 15 16 18   Temp: 97.7 F (36.5 C) 97.8 F (36.6 C) 98.1 F (36.7 C) 98.3 F (36.8 C)  TempSrc: Oral Oral Axillary Oral  SpO2: 97% 95% 96% 95%  Weight:      Height:        General: Pt is alert, awake, not in acute distress Patient is alert oriented x2-3, pleasant and comfortable.  Not in any distress. Cardiovascular: RRR, S1/S2 +, no rubs, no gallops Respiratory: CTA bilaterally, no wheezing, no rhonchi Abdominal: Soft, NT, ND, bowel sounds + Extremities: no edema, no cyanosis    The results of significant diagnostics from this hospitalization (including imaging, microbiology, ancillary and laboratory) are listed below for reference.     Microbiology: Recent Results (from the past 240 hour(s))  Urine Culture     Status: Abnormal   Collection Time: 03/17/20  5:31 PM   Specimen: Urine, Random  Result Value Ref Range Status   Specimen Description URINE, RANDOM  Final   Special Requests NONE  Final   Culture (A)  Final    <10,000 COLONIES/mL  INSIGNIFICANT GROWTH Performed at [facility], [address], [place], [place] [address]    Report Status 03/18/2020 FINAL  Final  SARS Coronavirus 2 by RT PCR (hospital order, performed in [facility] hospital lab) Nasopharyngeal Nasopharyngeal Swab     Status: None   Collection Time: 03/17/20  6:05 PM   Specimen: Nasopharyngeal Swab  Result Value Ref Range Status   SARS Coronavirus 2 NEGATIVE NEGATIVE Final    Comment: (NOTE) SARS-CoV-2 target nucleic acids are NOT DETECTED.  The SARS-CoV-2 RNA is generally detectable in upper and lower respiratory specimens during the acute phase of infection. The lowest concentration of SARS-CoV-2 viral copies this assay can detect is 250 copies / mL. A negative result does not preclude SARS-CoV-2 infection and should not be used as the sole basis for treatment or other patient management decisions.  A negative result may occur with improper specimen collection / handling, submission of specimen other than nasopharyngeal swab, presence of viral mutation(s) within the areas targeted by this assay, and inadequate number of viral copies (<250 copies / mL). A negative result must be combined with clinical observations, patient history, and epidemiological information.  Fact Sheet for Patients:   StrictlyIdeas.no  Fact Sheet for Healthcare Providers: BankingDealers.co.za  This test is not yet approved or  cleared by the [place] FDA and has been authorized for detection and/or diagnosis of SARS-CoV-2 by FDA under an Emergency Use Authorization (EUA).  This EUA will remain in effect (meaning this test can be used) for the duration of the COVID-19 declaration under Section 564(b)(1) of the Act, 21 U.S.C. section 360bbb-3(b)(1), unless the authorization is terminated or revoked sooner.  Performed at [facility], [place] [address], [place], [place] [address]   Surgical pcr screen  Status: None   Collection Time: 03/17/20 10:15 PM   Specimen: Nasal Mucosa; Nasal Swab  Result Value Ref Range Status   MRSA, PCR NEGATIVE NEGATIVE Final   Staphylococcus aureus NEGATIVE NEGATIVE Final    Comment: (NOTE) The Xpert SA Assay (FDA approved for NASAL specimens in patients 93 years of age and older), is one component of a comprehensive surveillance program. It is not intended to diagnose infection nor to guide or monitor treatment. Performed at [facility], [place] [address], [place], [place] [address]   SARS CORONAVIRUS 2 (TAT 6-24 HRS) Nasopharyngeal Nasopharyngeal Swab     Status: None   Collection Time: 03/22/20  5:47 AM   Specimen: Nasopharyngeal Swab  Result Value Ref Range Status   SARS Coronavirus 2 NEGATIVE NEGATIVE Final    Comment: (NOTE) SARS-CoV-2 target nucleic acids are NOT DETECTED.  The SARS-CoV-2 RNA is generally detectable in upper and lower respiratory specimens during the acute phase of infection. Negative results do not preclude SARS-CoV-2 infection, do not rule out co-infections with other pathogens, and should not be used as the sole basis for treatment or other patient management decisions. Negative results must be combined with clinical observations, patient history, and epidemiological information. The expected result is Negative.  Fact Sheet for Patients: SugarRoll.be  Fact Sheet for Healthcare Providers: https://www.woods-mathews.com/  This test is not yet approved or cleared by the [place] FDA and  has been authorized for detection and/or diagnosis of SARS-CoV-2 by FDA under an Emergency Use Authorization (EUA). This EUA will remain  in effect (meaning this test can be used) for the duration of the COVID-19 declaration under Se ction 564(b)(1) of the Act, 21 U.S.C. section 360bbb-3(b)(1), unless the authorization is terminated or revoked sooner.  Performed at [facility], [place] [address], [place], [place] [address]      Labs: BNP (last 3 results) No results for input(s): BNP in the last 8760 hours. Basic Metabolic Panel: Recent Labs  Lab 03/17/20 1606 03/17/20 1608 03/18/20 0316  NA 139 140 137  K 3.8 3.9 4.1  CL 104 101 102  CO2 27  --  28  GLUCOSE 110* 104* 116*  BUN 18 20 18   CREATININE 0.91 0.90 0.80  CALCIUM 9.3  --  8.9  MG 1.9  --   --   PHOS 3.3  --   --    Liver Function Tests: Recent Labs  Lab 03/18/20 0316  AST 33  ALT 10  ALKPHOS 96  BILITOT 0.4  PROT 5.9*  ALBUMIN 3.3*   No results for input(s): LIPASE, AMYLASE in the last 168 hours. No results for input(s): AMMONIA in the last 168 hours. CBC: Recent Labs  Lab 03/17/20 1606 03/17/20 1608 03/18/20 0316  WBC 8.6  --  7.4  HGB 13.7 13.6 12.3*  HCT 41.5 40.0 37.8*  MCV 89.1  --  88.5  PLT 242  --  182   Cardiac Enzymes: No results for input(s): CKTOTAL, CKMB, CKMBINDEX, TROPONINI in the last 168 hours. BNP: Invalid input(s): POCBNP CBG: No results for input(s): GLUCAP in the last 168 hours. D-Dimer No results for input(s): DDIMER in the last 72 hours. Hgb A1c No results for input(s): HGBA1C in the last 72 hours. Lipid Profile No results for input(s): CHOL, HDL, LDLCALC, TRIG, CHOLHDL, LDLDIRECT in the last 72 hours. Thyroid function studies No results for input(s): TSH, T4TOTAL, T3FREE, THYROIDAB in the last 72 hours.  Invalid input(s): FREET3 Anemia work up  No results for input(s): VITAMINB12, FOLATE, FERRITIN, TIBC, IRON, RETICCTPCT in the last 72 hours. Urinalysis    Component Value Date/Time   COLORURINE AMBER (A) 03/17/2020 1731   APPEARANCEUR HAZY (A) 03/17/2020 1731   LABSPEC >1.046 (H) 03/17/2020 1731   PHURINE 5.0 03/17/2020 1731   GLUCOSEU NEGATIVE 03/17/2020 1731   HGBUR NEGATIVE 03/17/2020 1731   BILIRUBINUR NEGATIVE 03/17/2020 1731   KETONESUR 5 (A) 03/17/2020 1731   PROTEINUR NEGATIVE 03/17/2020 1731   UROBILINOGEN 0.2 11/06/2008  1555   NITRITE NEGATIVE 03/17/2020 1731   LEUKOCYTESUR NEGATIVE 03/17/2020 1731   Sepsis Labs Invalid input(s): PROCALCITONIN,  WBC,  LACTICIDVEN Microbiology Recent Results (from the past 240 hour(s))  Urine Culture     Status: Abnormal   Collection Time: 03/17/20  5:31 PM   Specimen: Urine, Random  Result Value Ref Range Status   Specimen Description URINE, RANDOM  Final   Special Requests NONE  Final   Culture (A)  Final    <10,000 COLONIES/mL INSIGNIFICANT GROWTH Performed at [facility], [place] [address], [place], [place] [address]    Report Status 03/18/2020 FINAL  Final  SARS Coronavirus 2 by RT PCR (hospital order, performed in [facility] hospital lab) Nasopharyngeal Nasopharyngeal Swab     Status: None   Collection Time: 03/17/20  6:05 PM   Specimen: Nasopharyngeal Swab  Result Value Ref Range Status   SARS Coronavirus 2 NEGATIVE NEGATIVE Final    Comment: (NOTE) SARS-CoV-2 target nucleic acids are NOT DETECTED.  The SARS-CoV-2 RNA is generally detectable in upper and lower respiratory specimens during the acute phase of infection. The lowest concentration of SARS-CoV-2 viral copies this assay can detect is 250 copies / mL. A negative result does not preclude SARS-CoV-2 infection and should not be used as the sole basis for treatment or other patient management decisions.  A negative result may occur with improper specimen collection / handling, submission of specimen other than nasopharyngeal swab, presence of viral mutation(s) within the areas targeted by this assay, and inadequate number of viral copies (<250 copies / mL). A negative result must be combined with clinical observations, patient history, and epidemiological information.  Fact Sheet for Patients:   StrictlyIdeas.no  Fact Sheet for Healthcare Providers: BankingDealers.co.za  This test is not yet approved or  cleared by the [place] FDA  and has been authorized for detection and/or diagnosis of SARS-CoV-2 by FDA under an Emergency Use Authorization (EUA).  This EUA will remain in effect (meaning this test can be used) for the duration of the COVID-19 declaration under Section 564(b)(1) of the Act, 21 U.S.C. section 360bbb-3(b)(1), unless the authorization is terminated or revoked sooner.  Performed at [facility], [place] [address] [place], [place] [address]   Surgical pcr screen     Status: None   Collection Time: 03/17/20 10:15 PM   Specimen: Nasal Mucosa; Nasal Swab  Result Value Ref Range Status   MRSA, PCR NEGATIVE NEGATIVE Final   Staphylococcus aureus NEGATIVE NEGATIVE Final    Comment: (NOTE) The Xpert SA Assay (FDA approved for NASAL specimens in patients 61 years of age and older), is one component of a comprehensive surveillance program. It is not intended to diagnose infection nor to guide or monitor treatment. Performed at [facility], [place] [address], [place], [place] [address]   SARS CORONAVIRUS 2 (TAT 6-24 HRS) Nasopharyngeal Nasopharyngeal Swab     Status: None   Collection Time: 03/22/20  5:47 AM  Specimen: Nasopharyngeal Swab  Result Value Ref Range Status   SARS Coronavirus 2 NEGATIVE NEGATIVE Final    Comment: (NOTE) SARS-CoV-2 target nucleic acids are NOT DETECTED.  The SARS-CoV-2 RNA is generally detectable in upper and lower respiratory specimens during the acute phase of infection. Negative results do not preclude SARS-CoV-2 infection, do not rule out co-infections with other pathogens, and should not be used as the sole basis for treatment or other patient management decisions. Negative results must be combined with clinical observations, patient history, and epidemiological information. The expected result is Negative.  Fact Sheet for Patients: SugarRoll.be  Fact Sheet for Healthcare  Providers: https://www.woods-mathews.com/  This test is not yet approved or cleared by the [place] FDA and  has been authorized for detection and/or diagnosis of SARS-CoV-2 by FDA under an Emergency Use Authorization (EUA). This EUA will remain  in effect (meaning this test can be used) for the duration of the COVID-19 declaration under Se ction 564(b)(1) of the Act, 21 U.S.C. section 360bbb-3(b)(1), unless the authorization is terminated or revoked sooner.  Performed at [facility], [place] [address] [place], [place] [address]      Time coordinating discharge:  35 minutes  SIGNED:   Barb Merino, MD  [facility] 03/22/2020, 12:56 PM

## 2020-03-23 DIAGNOSIS — M8000XD Age-related osteoporosis with current pathological fracture, unspecified site, subsequent encounter for fracture with routine healing: Secondary | ICD-10-CM | POA: Diagnosis not present

## 2020-03-23 DIAGNOSIS — G20 Parkinson's disease: Secondary | ICD-10-CM | POA: Diagnosis not present

## 2020-03-23 DIAGNOSIS — F0390 Unspecified dementia without behavioral disturbance: Secondary | ICD-10-CM | POA: Diagnosis not present

## 2020-03-26 ENCOUNTER — Other Ambulatory Visit: Payer: Self-pay | Admitting: *Deleted

## 2020-03-26 NOTE — Patient Outreach (Signed)
[facility]) Care Management  03/26/2020  Alex Frazier Mar 19, 1946 [id number]   EMMI-GENERAL DISCHARGE-RESOLVED RED ON EMMI ALERT Day #1 Date: 03/24/2020 [place] Reason: QUESTIONS/CONCERNS, NO SCHEDULED F/U APPOINTMENT, WHO TO CALL WITH CHANGES  OUTREACH #1 RN spoke with pt who provided permission to speak with the spouse Alex Frazier). RN spoke with the spouse concerning emmi. Pt was discharged to a SNF [facility]). Wife was concerned about a diet and post-op appointments. RN directed all her current concerns should be directed to the facility where pt is currently residing in the care of the staff and personnel who can address all her needs. If there is a special diet the pt needs to be on souse aware she has the option to contact Dr. Dennard Schaumann (primary provider) to make a request to the facility for this pt. Spouse aware this RN is unable to address this due to an order needed for the facility to change with a providers order. Also informed the caregiver that the facility will discuss pt's discharge orders upon this stability to be discharged at that time for follow up appointments. Any changes at this time will be noted by the facility for interventions. Wife verbalized an understanding with no additional concerns.   Plan: Will close this case with no additional needs at this time.  Raina Mina, RN Care Management Coordinator [place] Office [phone]

## 2020-04-07 DIAGNOSIS — Z8673 Personal history of transient ischemic attack (TIA), and cerebral infarction without residual deficits: Secondary | ICD-10-CM | POA: Diagnosis not present

## 2020-04-07 DIAGNOSIS — Z9181 History of falling: Secondary | ICD-10-CM | POA: Diagnosis not present

## 2020-04-07 DIAGNOSIS — M21371 Foot drop, right foot: Secondary | ICD-10-CM | POA: Diagnosis not present

## 2020-04-07 DIAGNOSIS — F0280 Dementia in other diseases classified elsewhere without behavioral disturbance: Secondary | ICD-10-CM | POA: Diagnosis not present

## 2020-04-07 DIAGNOSIS — I951 Orthostatic hypotension: Secondary | ICD-10-CM | POA: Diagnosis not present

## 2020-04-07 DIAGNOSIS — G20 Parkinson's disease: Secondary | ICD-10-CM | POA: Diagnosis not present

## 2020-04-07 DIAGNOSIS — E785 Hyperlipidemia, unspecified: Secondary | ICD-10-CM | POA: Diagnosis not present

## 2020-04-07 DIAGNOSIS — M8008XD Age-related osteoporosis with current pathological fracture, vertebra(e), subsequent encounter for fracture with routine healing: Secondary | ICD-10-CM | POA: Diagnosis not present

## 2020-04-10 ENCOUNTER — Other Ambulatory Visit: Payer: Self-pay

## 2020-04-10 ENCOUNTER — Ambulatory Visit (INDEPENDENT_AMBULATORY_CARE_PROVIDER_SITE_OTHER): Payer: Medicare Other | Admitting: Family Medicine

## 2020-04-10 VITALS — BP 118/60 | HR 76 | Temp 98.9°F | Ht 66.0 in

## 2020-04-10 DIAGNOSIS — Z9181 History of falling: Secondary | ICD-10-CM

## 2020-04-10 DIAGNOSIS — M21371 Foot drop, right foot: Secondary | ICD-10-CM | POA: Diagnosis not present

## 2020-04-10 DIAGNOSIS — E785 Hyperlipidemia, unspecified: Secondary | ICD-10-CM | POA: Diagnosis not present

## 2020-04-10 DIAGNOSIS — M8008XD Age-related osteoporosis with current pathological fracture, vertebra(e), subsequent encounter for fracture with routine healing: Secondary | ICD-10-CM | POA: Diagnosis not present

## 2020-04-10 DIAGNOSIS — I951 Orthostatic hypotension: Secondary | ICD-10-CM | POA: Diagnosis not present

## 2020-04-10 DIAGNOSIS — S32000D Wedge compression fracture of unspecified lumbar vertebra, subsequent encounter for fracture with routine healing: Secondary | ICD-10-CM

## 2020-04-10 DIAGNOSIS — G20 Parkinson's disease: Secondary | ICD-10-CM | POA: Diagnosis not present

## 2020-04-10 DIAGNOSIS — F0280 Dementia in other diseases classified elsewhere without behavioral disturbance: Secondary | ICD-10-CM | POA: Diagnosis not present

## 2020-04-10 MED ORDER — SENNOSIDES-DOCUSATE SODIUM 8.6-50 MG PO TABS
2.0000 | ORAL_TABLET | Freq: Every evening | ORAL | 2 refills | Status: DC | PRN
Start: 2020-04-10 — End: 2024-02-26

## 2020-04-10 MED ORDER — HYDROCORTISONE 2.5 % EX CREA: TOPICAL_CREAM | Freq: Two times a day (BID) | CUTANEOUS | 0 refills | Status: DC

## 2020-04-10 NOTE — Progress Notes (Signed)
Subjective:    Patient ID: Alex Frazier, male    DOB: 1945/10/06, 74 y.o.   MRN: [id number]  HPI Patient is a 74 year old Caucasian male who was recently admitted to the hospital:  Admit date: 03/17/2020 Discharge date: 03/22/2020  Admitted From: home  Disposition:  SNF  Recommendations for Outpatient Follow-up:  1. Follow up with PCP in 1-2 weeks   Discharge Condition:stable   CODE STATUS: full code  Diet recommendation: low salt diet   Discharge Summary: Alex Frazier is a74 y.o.M with hx dementia, home dwelling, ambulatory with walker, Parkinson's disease, and hx TIA who presented with fall and new back pain.  In the ER, CT head, c-spine, maxilla, chest and abdomen showed no injuries or abnormalities other than new L3 and L5 vertebral compression fractures. The patient was in severe pain, and so the hospitalist service were asked to place in observation for therapy recommendations.   Assessment & Plan of care :    Acute vertebral body compression fracture, L3 and L5 He had a mild L1 comp fx in Feb this year, treated with Tylenol and ibuprofen at home. Patient's pain is unchanged -TLSO brace for comfort -To wear brace on ambulation, no need to wear at rest.  Pain medications, -Schedule acetaminophen 1000 mg at 7 AM, 1 PM, and 7 PM (with Sinemet) -Continue oxycodone 2.5 mg as needed for severe pain which the patient has tolerated well here  -Check blood pressure daily and resume midodrine when systolic blood pressure drops below 120 -He does have significant orthostatic changes, remains on Florinef, add midodrine if symptomatic.  Parkinson's disease -Continue Sinemet -Continue fludrocortisonefor autonomic dysfunction -Midodrine as needed.  Add for any symptomatic drop in blood pressure or dizziness.  Dementia Mentation at baseline -Continue donepezil -Resume duloxetine at discharge  History TIA -Continue pravastatin  Patient is stable to  transfer to skilled level of care.  Discharge Diagnoses:  Principal Problem:   Lumbar compression fracture ([place]) Active Problems:   Parkinson's disease ([place])   Hyperlipidemia   Pulmonary nodule   Patient is now here with Alex Frazier. He is home from the rehab facility. As always, Alex Frazier is taking excellent care of him. She is checking Alex blood pressure 3 times a day. He is not requiring the midodrine as often. She only gives midodrine if Alex blood pressure is low. He is taking fludrocortisone once a day for autonomic dysfunction. He used to be taking 0.2 mg fludrocortisone tablets a day however they have decreased it to 0.1 mg. This appears to be due to to swelling. The swelling has resolved. He has no pitting edema in Alex extremities and Alex blood pressure today is well controlled. He still complains of back pain. He is wearing a TLSO brace. He is not sure that the brace really helps with pain and he feels very uncomfortable wearing it while seated in a wheelchair. I explained to the patient and Alex Frazier that the brace is only for comfort. Therefore if he is not seeing significant improvement he can certainly come out of the brace. He should only be wearing this if it helps Alex pain. However Alex pain is relatively well controlled just with Tylenol. Alex Frazier is not giving him any oxycodone. He is dealing with constipation. He denies any cough or shortness of breath or chest pain. He denies any dysuria. He denies any pleurisy. He denies any hemoptysis Past Medical History:  Diagnosis Date   B12 deficiency    Borderline  Bulging discs    Degenerative disc disease, cervical    Gastritis    Hyperlipidemia    Neuropathy    Parkinson's disease ([place])    Parkinsonian syndrome ([facility])    Peripheral neuropathy    TIA (transient ischemic attack)    TIA symptoms from hypotension   Past Surgical History:  Procedure Laterality Date   APPENDECTOMY     BACK SURGERY     Current Outpatient  Medications on File Prior to Visit  Medication Sig Dispense Refill   acetaminophen (TYLENOL) 500 MG tablet Take 2 tablets (1,000 mg total) by mouth 3 (three) times daily. 30 tablet 0   carbidopa-levodopa (SINEMET IR) 25-100 MG tablet Take 2.5 tablets by mouth See admin instructions. Take 2.5 tablets by mouth three times daily- 7 AM, 1 PM, and 7 PM     Carbidopa-Levodopa ER (SINEMET CR) 25-100 MG tablet controlled release Take 1 tablet by mouth at bedtime.  2   cholecalciferol (VITAMIN D3) 25 MCG (1000 UNIT) tablet Take 2,000 Units by mouth in the morning.      Cyanocobalamin (VITAMIN B 12 PO) Take 1 tablet by mouth in the morning.      donepezil (ARICEPT) 5 MG tablet Take 5 mg by mouth at bedtime.      DULoxetine (CYMBALTA) 60 MG capsule Take 1 capsule (60 mg total) by mouth daily. (Patient taking differently: Take 60 mg by mouth at bedtime. ) 90 capsule 3   fludrocortisone (FLORINEF) 0.1 MG tablet TAKE 2 TABLETS BY MOUTH DAILY. GENERIC EQUIVALENT FOR FLORINEF. (Patient taking differently: Take 0.1 mg by mouth in the morning. ) 180 tablet 0   Menthol, Topical Analgesic, (BIOFREEZE) 4 % GEL Apply 1 application topically See admin instructions. Apply topically to painful sites as directed by MD     midodrine (PROAMATINE) 10 MG tablet Take 1 tablet (10 mg total) by mouth 3 (three) times daily. (Patient taking differently: Take 10 mg by mouth See admin instructions. Take 10 mg by mouth three times a day- 7 AM, 1 PM, and 7 PM) 270 tablet 3   polyethylene glycol powder (GLYCOLAX/MIRALAX) 17 GM/SCOOP powder Take 17 g by mouth See admin instructions. Mix 17 grams of powder into 4-8 ounces of prune juice and drink once a day     pravastatin (PRAVACHOL) 20 MG tablet TAKE 1 TABLET BY MOUTH DAILY AT 6 PM GENERIC EQUIVALENT FOR PRAVACHOL (Patient taking differently: Take 20 mg by mouth at bedtime. ) 90 tablet 2   No current facility-administered medications on file prior to visit.   Allergies   Allergen Reactions   Tape Other (See Comments)    SKIN IS VERY THIN- TEARS VERY EASILY!!   Hydrocodone-Acetaminophen Other (See Comments)    Hallucinations and confusion   Indomethacin Other (See Comments)    Unknown reaction   Lyrica [Pregabalin] Other (See Comments)    Vivid hallucinations   Quetiapine Other (See Comments)    HYPOtension!!   Social History   Socioeconomic History   Marital status: Married    Spouse name: Not on file   Number of children: 3   Years of education: Not on file   Highest education level: High school graduate  Occupational History   Not on file  Tobacco Use   Smoking status: Never Smoker   Smokeless tobacco: Never Used  Vaping Use   Vaping Use: Never used  Substance and Sexual Activity   Alcohol use: No   Drug use: No   Sexual activity: Not  on file  Other Topics Concern   Not on file  Social History Narrative   Lives at home with Alex Frazier   Right handed   Caffeine: none   Social Determinants of Health   Financial Resource Strain:    Difficulty of Paying Living Expenses:   Food Insecurity:    Worried About Charity fundraiser in the Last Year:    Arboriculturist in the Last Year:   Transportation Needs:    Film/video editor (Medical):    Lack of Transportation (Non-Medical):   Physical Activity:    Days of Exercise per Week:    Minutes of Exercise per Session:   Stress:    Feeling of Stress :   Social Connections:    Frequency of Communication with Friends and Family:    Frequency of Social Gatherings with Friends and Family:    Attends Religious Services:    Active Member of Clubs or Organizations:    Attends Music therapist:    Marital Status:   Intimate Partner Violence:    Fear of Current or Ex-Partner:    Emotionally Abused:    Physically Abused:    Sexually Abused:       Review of Systems  All other systems reviewed and are negative.      Objective:    Physical Exam Vitals reviewed.  Constitutional:      General: He is not in acute distress.    Appearance: Normal appearance. He is not ill-appearing or toxic-appearing.  Cardiovascular:     Rate and Rhythm: Normal rate and regular rhythm.     Pulses: Normal pulses.     Heart sounds: Normal heart sounds. No murmur heard.   Pulmonary:     Effort: Pulmonary effort is normal. No respiratory distress.     Breath sounds: Normal breath sounds. No wheezing, rhonchi or rales.  Abdominal:     General: Abdomen is flat. Bowel sounds are normal. There is no distension.     Palpations: Abdomen is soft. There is no mass.     Tenderness: There is no abdominal tenderness. There is no guarding or rebound.     Hernia: No hernia is present.  Musculoskeletal:     Right lower leg: No edema.     Left lower leg: No edema.  Skin:    Findings: No erythema or rash.  Neurological:     Mental Status: He is alert.           Assessment & Plan:  Orthostatic hypotension  Primary Parkinsonism ([place])  History of recent fall  Compression fracture of lumbar vertebra with routine healing, unspecified lumbar vertebral level, subsequent encounter  Continue fludrocortisone 0.1 mg tablets daily. Check blood pressure 3 times a day and use midodrine if blood pressure is low. If consistently requiring midodrine we will increase fludrocortisone back to 0.2 mg. Continue Sinemet. Pain seems relatively well controlled just with Tylenol. Therefore I do not feel the patient would benefit from vertebroplasty. I did tell the patient that he could discontinue wearing the TLSO brace if it is making him uncomfortable. He should only wear the brace if it helps with Alex comfort. No evidence of a UTI or pneumonia based on Alex exam and history. There is no physical symptoms or signs of a pulmonary embolism. Lab work was just recently checked. Therefore reassess in 6 months unless pain becomes severe. I did recommend Senokot 2 tablets  at night to help prevent constipation. He  can also use hydrocortisone 2.5% cream applied twice daily as needed to Alex hemorrhoids which she is suffering with due to constipation

## 2020-04-11 DIAGNOSIS — M21371 Foot drop, right foot: Secondary | ICD-10-CM | POA: Diagnosis not present

## 2020-04-11 DIAGNOSIS — G20 Parkinson's disease: Secondary | ICD-10-CM | POA: Diagnosis not present

## 2020-04-11 DIAGNOSIS — M8008XD Age-related osteoporosis with current pathological fracture, vertebra(e), subsequent encounter for fracture with routine healing: Secondary | ICD-10-CM | POA: Diagnosis not present

## 2020-04-11 DIAGNOSIS — F0280 Dementia in other diseases classified elsewhere without behavioral disturbance: Secondary | ICD-10-CM | POA: Diagnosis not present

## 2020-04-11 DIAGNOSIS — I951 Orthostatic hypotension: Secondary | ICD-10-CM | POA: Diagnosis not present

## 2020-04-11 DIAGNOSIS — E785 Hyperlipidemia, unspecified: Secondary | ICD-10-CM | POA: Diagnosis not present

## 2020-04-13 DIAGNOSIS — M8008XD Age-related osteoporosis with current pathological fracture, vertebra(e), subsequent encounter for fracture with routine healing: Secondary | ICD-10-CM | POA: Diagnosis not present

## 2020-04-13 DIAGNOSIS — G20 Parkinson's disease: Secondary | ICD-10-CM | POA: Diagnosis not present

## 2020-04-13 DIAGNOSIS — I951 Orthostatic hypotension: Secondary | ICD-10-CM | POA: Diagnosis not present

## 2020-04-13 DIAGNOSIS — F0280 Dementia in other diseases classified elsewhere without behavioral disturbance: Secondary | ICD-10-CM | POA: Diagnosis not present

## 2020-04-13 DIAGNOSIS — E785 Hyperlipidemia, unspecified: Secondary | ICD-10-CM | POA: Diagnosis not present

## 2020-04-13 DIAGNOSIS — M21371 Foot drop, right foot: Secondary | ICD-10-CM | POA: Diagnosis not present

## 2020-04-16 DIAGNOSIS — E785 Hyperlipidemia, unspecified: Secondary | ICD-10-CM | POA: Diagnosis not present

## 2020-04-16 DIAGNOSIS — I951 Orthostatic hypotension: Secondary | ICD-10-CM | POA: Diagnosis not present

## 2020-04-16 DIAGNOSIS — M21371 Foot drop, right foot: Secondary | ICD-10-CM | POA: Diagnosis not present

## 2020-04-16 DIAGNOSIS — M8008XD Age-related osteoporosis with current pathological fracture, vertebra(e), subsequent encounter for fracture with routine healing: Secondary | ICD-10-CM | POA: Diagnosis not present

## 2020-04-16 DIAGNOSIS — G20 Parkinson's disease: Secondary | ICD-10-CM | POA: Diagnosis not present

## 2020-04-16 DIAGNOSIS — F0280 Dementia in other diseases classified elsewhere without behavioral disturbance: Secondary | ICD-10-CM | POA: Diagnosis not present

## 2020-04-18 DIAGNOSIS — M21371 Foot drop, right foot: Secondary | ICD-10-CM | POA: Diagnosis not present

## 2020-04-18 DIAGNOSIS — I951 Orthostatic hypotension: Secondary | ICD-10-CM | POA: Diagnosis not present

## 2020-04-18 DIAGNOSIS — M8008XD Age-related osteoporosis with current pathological fracture, vertebra(e), subsequent encounter for fracture with routine healing: Secondary | ICD-10-CM | POA: Diagnosis not present

## 2020-04-18 DIAGNOSIS — E785 Hyperlipidemia, unspecified: Secondary | ICD-10-CM | POA: Diagnosis not present

## 2020-04-18 DIAGNOSIS — G20 Parkinson's disease: Secondary | ICD-10-CM | POA: Diagnosis not present

## 2020-04-18 DIAGNOSIS — F0280 Dementia in other diseases classified elsewhere without behavioral disturbance: Secondary | ICD-10-CM | POA: Diagnosis not present

## 2020-04-19 DIAGNOSIS — F0280 Dementia in other diseases classified elsewhere without behavioral disturbance: Secondary | ICD-10-CM | POA: Diagnosis not present

## 2020-04-19 DIAGNOSIS — M8008XD Age-related osteoporosis with current pathological fracture, vertebra(e), subsequent encounter for fracture with routine healing: Secondary | ICD-10-CM | POA: Diagnosis not present

## 2020-04-19 DIAGNOSIS — I951 Orthostatic hypotension: Secondary | ICD-10-CM | POA: Diagnosis not present

## 2020-04-19 DIAGNOSIS — G20 Parkinson's disease: Secondary | ICD-10-CM | POA: Diagnosis not present

## 2020-04-19 DIAGNOSIS — M21371 Foot drop, right foot: Secondary | ICD-10-CM | POA: Diagnosis not present

## 2020-04-19 DIAGNOSIS — E785 Hyperlipidemia, unspecified: Secondary | ICD-10-CM | POA: Diagnosis not present

## 2020-04-20 DIAGNOSIS — M8008XD Age-related osteoporosis with current pathological fracture, vertebra(e), subsequent encounter for fracture with routine healing: Secondary | ICD-10-CM | POA: Diagnosis not present

## 2020-04-20 DIAGNOSIS — M21371 Foot drop, right foot: Secondary | ICD-10-CM | POA: Diagnosis not present

## 2020-04-20 DIAGNOSIS — I951 Orthostatic hypotension: Secondary | ICD-10-CM | POA: Diagnosis not present

## 2020-04-20 DIAGNOSIS — E785 Hyperlipidemia, unspecified: Secondary | ICD-10-CM | POA: Diagnosis not present

## 2020-04-20 DIAGNOSIS — G20 Parkinson's disease: Secondary | ICD-10-CM | POA: Diagnosis not present

## 2020-04-20 DIAGNOSIS — F0280 Dementia in other diseases classified elsewhere without behavioral disturbance: Secondary | ICD-10-CM | POA: Diagnosis not present

## 2020-04-24 DIAGNOSIS — E785 Hyperlipidemia, unspecified: Secondary | ICD-10-CM | POA: Diagnosis not present

## 2020-04-24 DIAGNOSIS — M21371 Foot drop, right foot: Secondary | ICD-10-CM | POA: Diagnosis not present

## 2020-04-24 DIAGNOSIS — F0280 Dementia in other diseases classified elsewhere without behavioral disturbance: Secondary | ICD-10-CM | POA: Diagnosis not present

## 2020-04-24 DIAGNOSIS — I951 Orthostatic hypotension: Secondary | ICD-10-CM | POA: Diagnosis not present

## 2020-04-24 DIAGNOSIS — G20 Parkinson's disease: Secondary | ICD-10-CM | POA: Diagnosis not present

## 2020-04-24 DIAGNOSIS — M8008XD Age-related osteoporosis with current pathological fracture, vertebra(e), subsequent encounter for fracture with routine healing: Secondary | ICD-10-CM | POA: Diagnosis not present

## 2020-04-26 ENCOUNTER — Telehealth: Payer: Self-pay | Admitting: *Deleted

## 2020-04-26 DIAGNOSIS — G20 Parkinson's disease: Secondary | ICD-10-CM | POA: Diagnosis not present

## 2020-04-26 DIAGNOSIS — F0280 Dementia in other diseases classified elsewhere without behavioral disturbance: Secondary | ICD-10-CM | POA: Diagnosis not present

## 2020-04-26 DIAGNOSIS — M21371 Foot drop, right foot: Secondary | ICD-10-CM | POA: Diagnosis not present

## 2020-04-26 DIAGNOSIS — M8008XD Age-related osteoporosis with current pathological fracture, vertebra(e), subsequent encounter for fracture with routine healing: Secondary | ICD-10-CM | POA: Diagnosis not present

## 2020-04-26 DIAGNOSIS — I951 Orthostatic hypotension: Secondary | ICD-10-CM | POA: Diagnosis not present

## 2020-04-26 DIAGNOSIS — E785 Hyperlipidemia, unspecified: Secondary | ICD-10-CM | POA: Diagnosis not present

## 2020-04-26 NOTE — Telephone Encounter (Signed)
Received call from [place], Libby with [facility] [phone]~ telephone.   Reports that patient wife is requesting to decrease SN visits next week to (1).   VO given.

## 2020-05-01 DIAGNOSIS — M8008XD Age-related osteoporosis with current pathological fracture, vertebra(e), subsequent encounter for fracture with routine healing: Secondary | ICD-10-CM | POA: Diagnosis not present

## 2020-05-01 DIAGNOSIS — M21371 Foot drop, right foot: Secondary | ICD-10-CM | POA: Diagnosis not present

## 2020-05-01 DIAGNOSIS — G20 Parkinson's disease: Secondary | ICD-10-CM | POA: Diagnosis not present

## 2020-05-01 DIAGNOSIS — I951 Orthostatic hypotension: Secondary | ICD-10-CM | POA: Diagnosis not present

## 2020-05-01 DIAGNOSIS — F0280 Dementia in other diseases classified elsewhere without behavioral disturbance: Secondary | ICD-10-CM | POA: Diagnosis not present

## 2020-05-01 DIAGNOSIS — E785 Hyperlipidemia, unspecified: Secondary | ICD-10-CM | POA: Diagnosis not present

## 2020-05-02 DIAGNOSIS — M8008XD Age-related osteoporosis with current pathological fracture, vertebra(e), subsequent encounter for fracture with routine healing: Secondary | ICD-10-CM | POA: Diagnosis not present

## 2020-05-02 DIAGNOSIS — E785 Hyperlipidemia, unspecified: Secondary | ICD-10-CM | POA: Diagnosis not present

## 2020-05-02 DIAGNOSIS — G20 Parkinson's disease: Secondary | ICD-10-CM | POA: Diagnosis not present

## 2020-05-02 DIAGNOSIS — F0280 Dementia in other diseases classified elsewhere without behavioral disturbance: Secondary | ICD-10-CM | POA: Diagnosis not present

## 2020-05-02 DIAGNOSIS — M21371 Foot drop, right foot: Secondary | ICD-10-CM | POA: Diagnosis not present

## 2020-05-02 DIAGNOSIS — I951 Orthostatic hypotension: Secondary | ICD-10-CM | POA: Diagnosis not present

## 2020-05-03 DIAGNOSIS — F0280 Dementia in other diseases classified elsewhere without behavioral disturbance: Secondary | ICD-10-CM | POA: Diagnosis not present

## 2020-05-03 DIAGNOSIS — E785 Hyperlipidemia, unspecified: Secondary | ICD-10-CM | POA: Diagnosis not present

## 2020-05-03 DIAGNOSIS — M21371 Foot drop, right foot: Secondary | ICD-10-CM | POA: Diagnosis not present

## 2020-05-03 DIAGNOSIS — I951 Orthostatic hypotension: Secondary | ICD-10-CM | POA: Diagnosis not present

## 2020-05-03 DIAGNOSIS — M8008XD Age-related osteoporosis with current pathological fracture, vertebra(e), subsequent encounter for fracture with routine healing: Secondary | ICD-10-CM | POA: Diagnosis not present

## 2020-05-03 DIAGNOSIS — G20 Parkinson's disease: Secondary | ICD-10-CM | POA: Diagnosis not present

## 2020-05-07 DIAGNOSIS — G20 Parkinson's disease: Secondary | ICD-10-CM | POA: Diagnosis not present

## 2020-05-07 DIAGNOSIS — E785 Hyperlipidemia, unspecified: Secondary | ICD-10-CM | POA: Diagnosis not present

## 2020-05-07 DIAGNOSIS — F0280 Dementia in other diseases classified elsewhere without behavioral disturbance: Secondary | ICD-10-CM | POA: Diagnosis not present

## 2020-05-07 DIAGNOSIS — Z8673 Personal history of transient ischemic attack (TIA), and cerebral infarction without residual deficits: Secondary | ICD-10-CM | POA: Diagnosis not present

## 2020-05-07 DIAGNOSIS — I951 Orthostatic hypotension: Secondary | ICD-10-CM | POA: Diagnosis not present

## 2020-05-07 DIAGNOSIS — Z9181 History of falling: Secondary | ICD-10-CM | POA: Diagnosis not present

## 2020-05-07 DIAGNOSIS — M8008XD Age-related osteoporosis with current pathological fracture, vertebra(e), subsequent encounter for fracture with routine healing: Secondary | ICD-10-CM | POA: Diagnosis not present

## 2020-05-07 DIAGNOSIS — M21371 Foot drop, right foot: Secondary | ICD-10-CM | POA: Diagnosis not present

## 2020-05-09 DIAGNOSIS — E785 Hyperlipidemia, unspecified: Secondary | ICD-10-CM | POA: Diagnosis not present

## 2020-05-09 DIAGNOSIS — I951 Orthostatic hypotension: Secondary | ICD-10-CM | POA: Diagnosis not present

## 2020-05-09 DIAGNOSIS — M21371 Foot drop, right foot: Secondary | ICD-10-CM | POA: Diagnosis not present

## 2020-05-09 DIAGNOSIS — G20 Parkinson's disease: Secondary | ICD-10-CM | POA: Diagnosis not present

## 2020-05-09 DIAGNOSIS — M8008XD Age-related osteoporosis with current pathological fracture, vertebra(e), subsequent encounter for fracture with routine healing: Secondary | ICD-10-CM | POA: Diagnosis not present

## 2020-05-09 DIAGNOSIS — F0280 Dementia in other diseases classified elsewhere without behavioral disturbance: Secondary | ICD-10-CM | POA: Diagnosis not present

## 2020-05-10 DIAGNOSIS — F0280 Dementia in other diseases classified elsewhere without behavioral disturbance: Secondary | ICD-10-CM | POA: Diagnosis not present

## 2020-05-10 DIAGNOSIS — M8008XD Age-related osteoporosis with current pathological fracture, vertebra(e), subsequent encounter for fracture with routine healing: Secondary | ICD-10-CM | POA: Diagnosis not present

## 2020-05-10 DIAGNOSIS — M21371 Foot drop, right foot: Secondary | ICD-10-CM | POA: Diagnosis not present

## 2020-05-10 DIAGNOSIS — I951 Orthostatic hypotension: Secondary | ICD-10-CM | POA: Diagnosis not present

## 2020-05-10 DIAGNOSIS — E785 Hyperlipidemia, unspecified: Secondary | ICD-10-CM | POA: Diagnosis not present

## 2020-05-10 DIAGNOSIS — G20 Parkinson's disease: Secondary | ICD-10-CM | POA: Diagnosis not present

## 2020-05-11 DIAGNOSIS — I951 Orthostatic hypotension: Secondary | ICD-10-CM | POA: Diagnosis not present

## 2020-05-11 DIAGNOSIS — F0280 Dementia in other diseases classified elsewhere without behavioral disturbance: Secondary | ICD-10-CM | POA: Diagnosis not present

## 2020-05-11 DIAGNOSIS — M8008XD Age-related osteoporosis with current pathological fracture, vertebra(e), subsequent encounter for fracture with routine healing: Secondary | ICD-10-CM | POA: Diagnosis not present

## 2020-05-11 DIAGNOSIS — E785 Hyperlipidemia, unspecified: Secondary | ICD-10-CM | POA: Diagnosis not present

## 2020-05-11 DIAGNOSIS — G20 Parkinson's disease: Secondary | ICD-10-CM | POA: Diagnosis not present

## 2020-05-11 DIAGNOSIS — M21371 Foot drop, right foot: Secondary | ICD-10-CM | POA: Diagnosis not present

## 2020-05-15 DIAGNOSIS — M8008XD Age-related osteoporosis with current pathological fracture, vertebra(e), subsequent encounter for fracture with routine healing: Secondary | ICD-10-CM | POA: Diagnosis not present

## 2020-05-15 DIAGNOSIS — G20 Parkinson's disease: Secondary | ICD-10-CM | POA: Diagnosis not present

## 2020-05-15 DIAGNOSIS — E785 Hyperlipidemia, unspecified: Secondary | ICD-10-CM | POA: Diagnosis not present

## 2020-05-15 DIAGNOSIS — M21371 Foot drop, right foot: Secondary | ICD-10-CM | POA: Diagnosis not present

## 2020-05-15 DIAGNOSIS — I951 Orthostatic hypotension: Secondary | ICD-10-CM | POA: Diagnosis not present

## 2020-05-15 DIAGNOSIS — F0280 Dementia in other diseases classified elsewhere without behavioral disturbance: Secondary | ICD-10-CM | POA: Diagnosis not present

## 2020-05-18 DIAGNOSIS — F0280 Dementia in other diseases classified elsewhere without behavioral disturbance: Secondary | ICD-10-CM | POA: Diagnosis not present

## 2020-05-18 DIAGNOSIS — G20 Parkinson's disease: Secondary | ICD-10-CM | POA: Diagnosis not present

## 2020-05-18 DIAGNOSIS — I951 Orthostatic hypotension: Secondary | ICD-10-CM | POA: Diagnosis not present

## 2020-05-18 DIAGNOSIS — E785 Hyperlipidemia, unspecified: Secondary | ICD-10-CM | POA: Diagnosis not present

## 2020-05-18 DIAGNOSIS — M8008XD Age-related osteoporosis with current pathological fracture, vertebra(e), subsequent encounter for fracture with routine healing: Secondary | ICD-10-CM | POA: Diagnosis not present

## 2020-05-18 DIAGNOSIS — M21371 Foot drop, right foot: Secondary | ICD-10-CM | POA: Diagnosis not present

## 2020-07-04 ENCOUNTER — Ambulatory Visit: Payer: Medicare Other

## 2020-07-04 ENCOUNTER — Other Ambulatory Visit: Payer: Self-pay | Admitting: Family Medicine

## 2020-07-10 DIAGNOSIS — Z23 Encounter for immunization: Secondary | ICD-10-CM | POA: Diagnosis not present

## 2020-07-18 ENCOUNTER — Other Ambulatory Visit: Payer: Self-pay

## 2020-07-18 DIAGNOSIS — E785 Hyperlipidemia, unspecified: Secondary | ICD-10-CM

## 2020-07-18 MED ORDER — PRAVASTATIN SODIUM 20 MG PO TABS: ORAL_TABLET | ORAL | 2 refills | Status: DC

## 2020-07-25 ENCOUNTER — Ambulatory Visit: Payer: Medicare Other

## 2020-07-27 ENCOUNTER — Other Ambulatory Visit: Payer: Self-pay | Admitting: Family Medicine

## 2020-07-27 NOTE — Telephone Encounter (Signed)
Please Advise

## 2020-08-15 DIAGNOSIS — I951 Orthostatic hypotension: Secondary | ICD-10-CM | POA: Diagnosis not present

## 2020-08-15 DIAGNOSIS — G20 Parkinson's disease: Secondary | ICD-10-CM | POA: Diagnosis not present

## 2020-08-15 DIAGNOSIS — Z79899 Other long term (current) drug therapy: Secondary | ICD-10-CM | POA: Diagnosis not present

## 2020-10-06 ENCOUNTER — Other Ambulatory Visit: Payer: Self-pay | Admitting: Family Medicine

## 2020-10-11 ENCOUNTER — Ambulatory Visit: Payer: Medicare Other | Admitting: Family Medicine

## 2020-11-27 ENCOUNTER — Other Ambulatory Visit: Payer: Self-pay

## 2020-11-27 ENCOUNTER — Encounter: Payer: Self-pay | Admitting: Family Medicine

## 2020-11-27 ENCOUNTER — Ambulatory Visit (INDEPENDENT_AMBULATORY_CARE_PROVIDER_SITE_OTHER): Payer: Medicare Other | Admitting: Family Medicine

## 2020-11-27 VITALS — BP 122/64 | HR 82 | Temp 98.9°F | Resp 16

## 2020-11-27 DIAGNOSIS — G20 Parkinson's disease: Secondary | ICD-10-CM

## 2020-11-27 LAB — CBC WITH DIFFERENTIAL/PLATELET
Absolute Monocytes: 378 cells/uL (ref 200–950)
Basophils Absolute: 21 cells/uL (ref 0–200)
Basophils Relative: 0.5 %
Eosinophils Absolute: 38 cells/uL (ref 15–500)
Eosinophils Relative: 0.9 %
HCT: 40.5 % (ref 38.5–50.0)
Hemoglobin: 13.8 g/dL (ref 13.2–17.1)
Lymphs Abs: 974 cells/uL (ref 850–3900)
MCH: 30.3 pg (ref 27.0–33.0)
MCHC: 34.1 g/dL (ref 32.0–36.0)
MCV: 88.8 fL (ref 80.0–100.0)
MPV: 9.8 fL (ref 7.5–12.5)
Monocytes Relative: 9 %
Neutro Abs: 2789 cells/uL (ref 1500–7800)
Neutrophils Relative %: 66.4 %
Platelets: 236 10*3/uL (ref 140–400)
RBC: 4.56 10*6/uL (ref 4.20–5.80)
RDW: 12.7 % (ref 11.0–15.0)
Total Lymphocyte: 23.2 %
WBC: 4.2 10*3/uL (ref 3.8–10.8)

## 2020-11-27 LAB — COMPLETE METABOLIC PANEL WITH GFR
AG Ratio: 1.7 (calc) (ref 1.0–2.5)
ALT: 7 U/L — ABNORMAL LOW (ref 9–46)
AST: 17 U/L (ref 10–35)
Albumin: 3.8 g/dL (ref 3.6–5.1)
Alkaline phosphatase (APISO): 114 U/L (ref 35–144)
BUN: 16 mg/dL (ref 7–25)
CO2: 29 mmol/L (ref 20–32)
Calcium: 8.9 mg/dL (ref 8.6–10.3)
Chloride: 105 mmol/L (ref 98–110)
Creat: 0.86 mg/dL (ref 0.70–1.18)
GFR, Est African American: 99 mL/min/{1.73_m2} (ref >=60)
GFR, Est Non African American: 85 mL/min/{1.73_m2} (ref >=60)
Globulin: 2.2 g/dL (calc) (ref 1.9–3.7)
Glucose, Bld: 144 mg/dL — ABNORMAL HIGH (ref 65–99)
Potassium: 4.3 mmol/L (ref 3.5–5.3)
Sodium: 140 mmol/L (ref 135–146)
Total Bilirubin: 0.5 mg/dL (ref 0.2–1.2)
Total Protein: 6 g/dL — ABNORMAL LOW (ref 6.1–8.1)

## 2020-11-27 IMAGING — MR MR HEAD W/O CM
12 of 13 series · 44 of 48 positions shown · non-contrast
Comparison: Head CT 07/26/2019.

CLINICAL DATA: 73-year-old male with episode of aphasia and slurred
speech early on 07/26/2019.

EXAM:
MRI HEAD WITHOUT CONTRAST
TECHNIQUE: Multiplanar, multiecho pulse sequences of the brain and surrounding
structures were obtained without intravenous contrast.

[Series 5: DWI · axial · 3.0mm · 0.88mm/px · z∈[-94,+45]mm · 8 of 96 slices shown (1 of 4)]
[im 1/96]
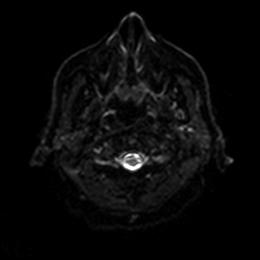
[im 14/96]
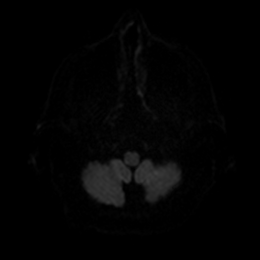
[im 28/96]
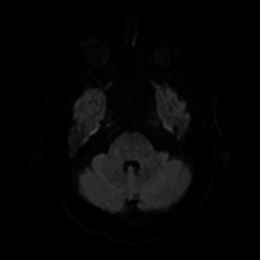
[im 41/96]
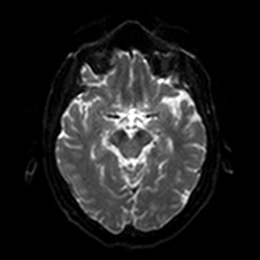
[im 55/96]
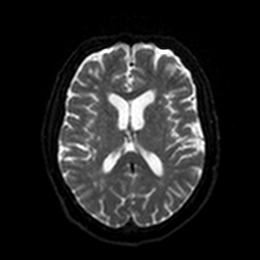
[im 68/96]
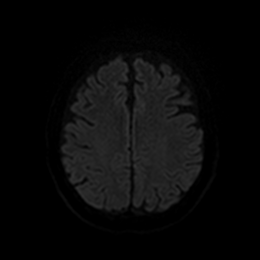
[im 82/96]
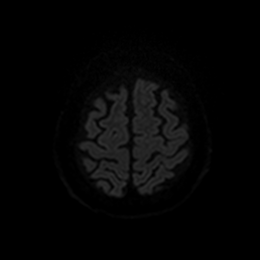
[im 96/96]
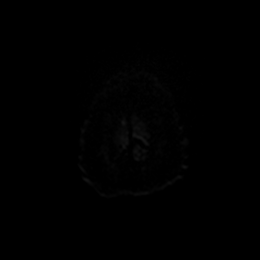

[Series 6: DWI · axial · 3.0mm · 0.88mm/px · z∈[-94,+45]mm · 4 of 48 slices shown (2 of 4)]
[im 1/48]
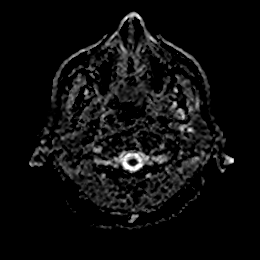
[im 16/48]
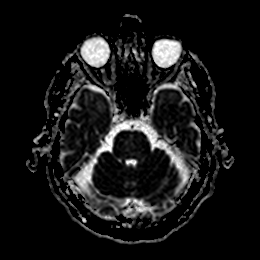
[im 32/48]
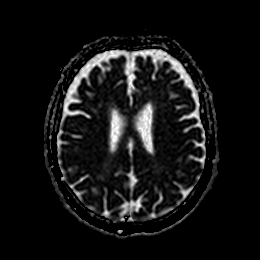
[im 48/48]
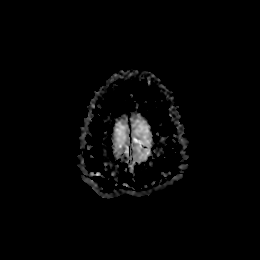

[Series 7: DWI · coronal · 4.0mm · 0.88mm/px · 5 of 68 slices shown (3 of 4)]
[im 1/68]
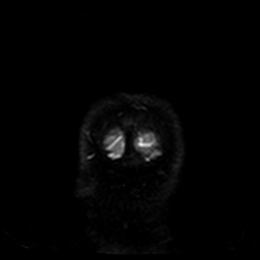
[im 17/68]
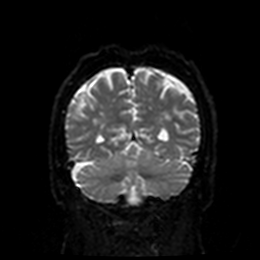
[im 34/68]
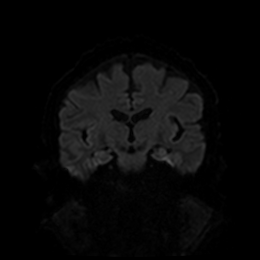
[im 51/68]
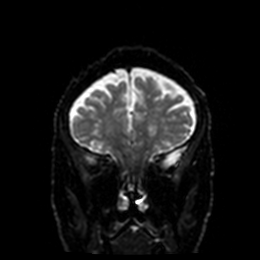
[im 68/68]
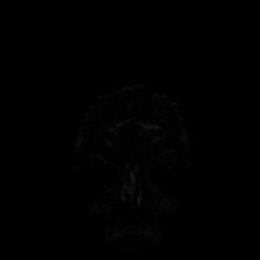

[Series 8: DWI · coronal · 4.0mm · 0.88mm/px · 3 of 34 slices shown (4 of 4)]
[im 1/34]
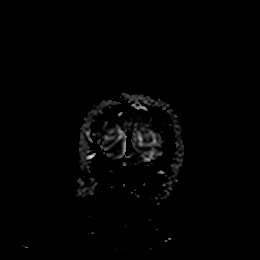
[im 17/34]
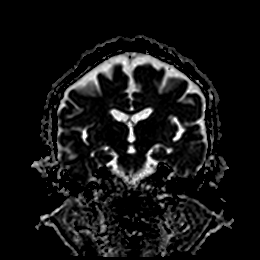
[im 34/34]
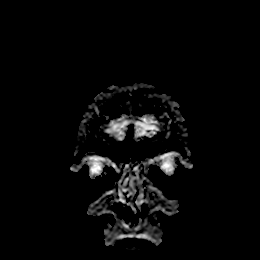

[Series 9: T1 · sagittal · 5.0mm · 0.75mm/px · 2 of 24 slices shown]
[im 1/24]
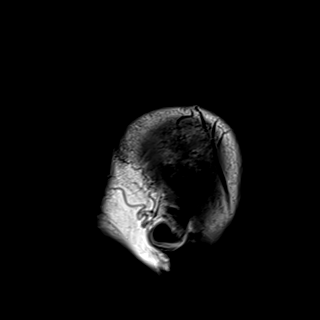
[im 24/24]
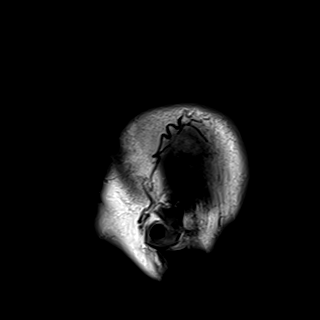

[Series 10: T2 · axial · 5.0mm · 0.72mm/px · z∈[-95,+47]mm · 2 of 25 slices shown (1 of 2)]
[im 1/25]
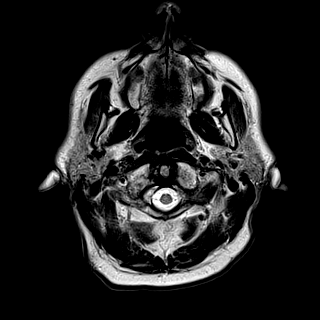
[im 25/25]
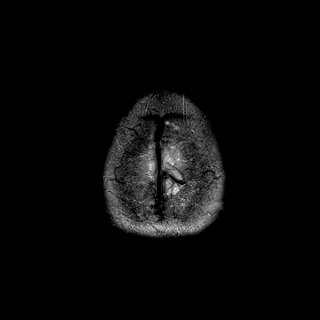

[Series 11: FLAIR · axial · 5.0mm · 0.45mm/px · z∈[-94,+48]mm · 2 of 25 slices shown]
[im 1/25]
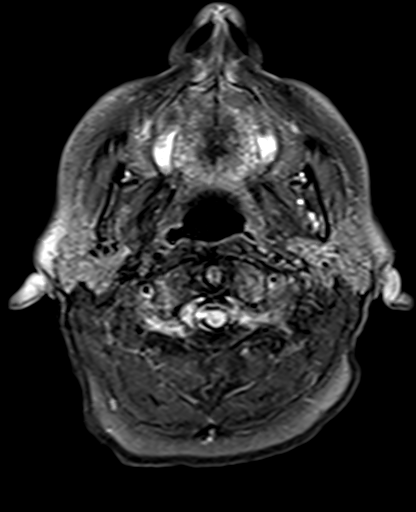
[im 25/25]
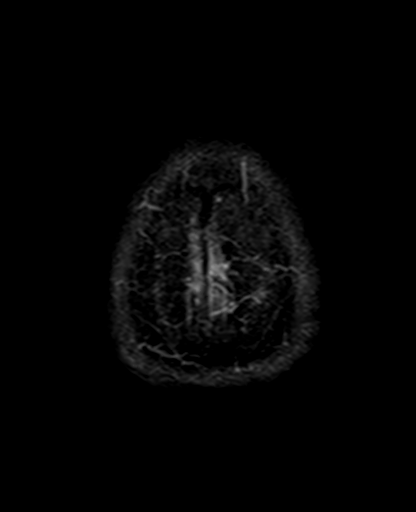

[Series 12: mag_images · axial · 3.0mm · 0.90mm/px · z∈[-106,+70]mm · 4 of 60 slices shown]
[im 1/60]
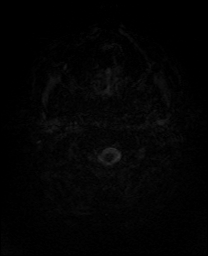
[im 20/60]
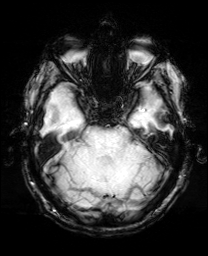
[im 40/60]
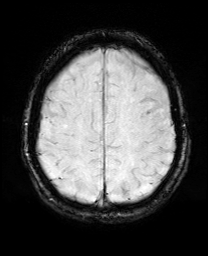
[im 60/60]
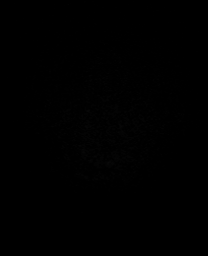

[Series 13: pha_images · axial · 3.0mm · 0.90mm/px · z∈[-106,+70]mm · 4 of 59 slices shown]
[im 1/59]
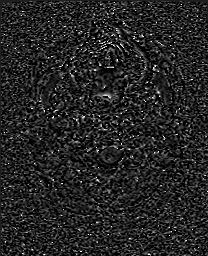
[im 20/59]
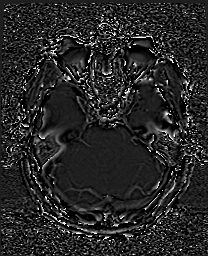
[im 39/59]
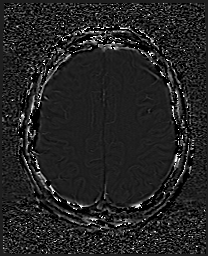
[im 59/59]
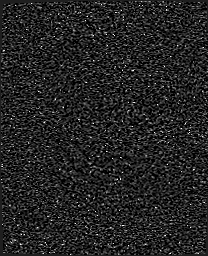

[Series 14: swi_images · axial · 3.0mm · 0.90mm/px · z∈[-106,+70]mm · 4 of 60 slices shown]
[im 1/60]
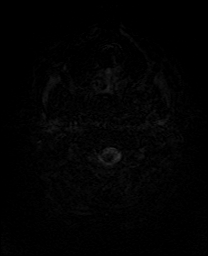
[im 20/60]
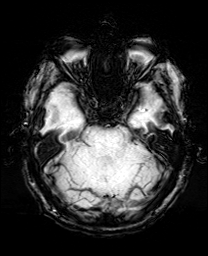
[im 40/60]
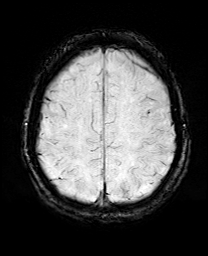
[im 60/60]
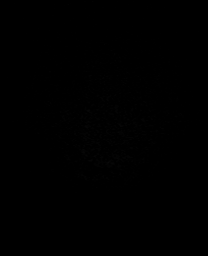

[Series 15: mip_images(sw) · axial · 24.0mm · 0.90mm/px · z∈[-95,+59]mm · 4 of 53 slices shown]
[im 1/53]
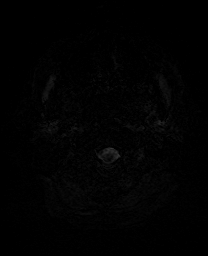
[im 18/53]
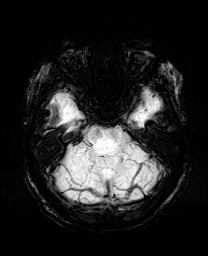
[im 35/53]
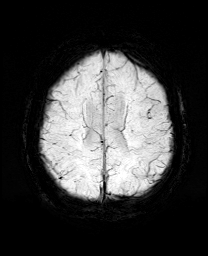
[im 53/53]
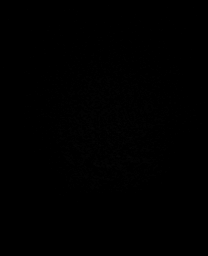

[Series 17: T2 · coronal · 5.0mm · 0.72mm/px · 2 of 28 slices shown (2 of 2)]
[im 1/28]
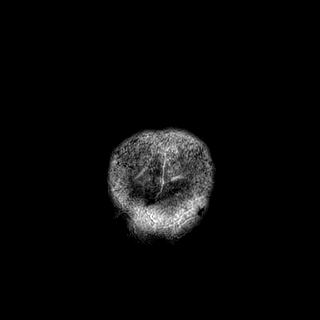
[im 28/28]
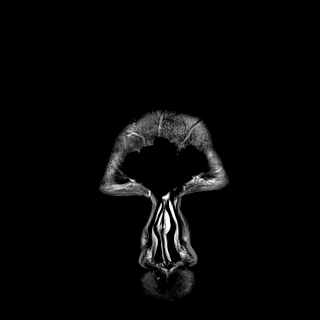

[44 of 48 positions shown; findings below may reference images not displayed]

FINDINGS: Brain: No restricted diffusion to suggest acute infarction. No
midline shift, mass effect, evidence of mass lesion,
ventriculomegaly, extra-axial collection or acute intracranial
hemorrhage. Cervicomedullary junction and pituitary are within
normal limits.

Widely scattered cerebral white matter T2 and FLAIR hyperintensity
in a nonspecific configuration, moderate for age. Patchy T2 and
FLAIR hyperintensity in the central pons. No superimposed cortical
encephalomalacia. No chronic cerebral blood products identified.
Deep gray nuclei and cerebellum within normal limits for age.

Vascular: Major intracranial vascular flow voids are preserved.

Skull and upper cervical spine: Negative visible cervical spine.
Degenerative appearing sclerosis of the right mandible condyle.
Other visualized bone marrow signal is within normal limits.

Sinuses/Orbits: Postoperative changes to both globes, negative
orbits. Paranasal sinuses and mastoids are stable and well
pneumatized.

Other: Visible internal auditory structures appear normal. Scalp and
face soft tissues appear negative.
IMPRESSION: 1. No acute intracranial abnormality.
2. Moderate for age nonspecific signal changes in the brain, most
commonly due to chronic small vessel disease.

## 2020-11-27 NOTE — Progress Notes (Signed)
Subjective:    Patient ID: Alex Frazier, male    DOB: 07/20/1946, 75 y.o.   MRN: [id number]  HPI 04/10/20 Patient is now here with his wife. He is home from the rehab facility. As always, his wife is taking excellent care of him. She is checking his blood pressure 3 times a day. He is not requiring the midodrine as often. She only gives midodrine if his blood pressure is low. He is taking fludrocortisone once a day for autonomic dysfunction. He used to be taking 0.2 mg fludrocortisone tablets a day however they have decreased it to 0.1 mg. This appears to be due to to swelling. The swelling has resolved. He has no pitting edema in his extremities and his blood pressure today is well controlled. He still complains of back pain. He is wearing a TLSO brace. He is not sure that the brace really helps with pain and he feels very uncomfortable wearing it while seated in a wheelchair. I explained to the patient and his wife that the brace is only for comfort. Therefore if he is not seeing significant improvement he can certainly come out of the brace. He should only be wearing this if it helps his pain. However his pain is relatively well controlled just with Tylenol. His wife is not giving him any oxycodone. He is dealing with constipation. He denies any cough or shortness of breath or chest pain. He denies any dysuria. He denies any pleurisy. He denies any hemoptysis.  At that time, my plan was: Continue fludrocortisone 0.1 mg tablets daily. Check blood pressure 3 times a day and use midodrine if blood pressure is low. If consistently requiring midodrine we will increase fludrocortisone back to 0.2 mg. Continue Sinemet. Pain seems relatively well controlled just with Tylenol. Therefore I do not feel the patient would benefit from vertebroplasty. I did tell the patient that he could discontinue wearing the TLSO brace if it is making him uncomfortable. He should only wear the brace if it helps with his comfort. No  evidence of a UTI or pneumonia based on his exam and history. There is no physical symptoms or signs of a pulmonary embolism. Lab work was just recently checked. Therefore reassess in 6 months unless pain becomes severe. I did recommend Senokot 2 tablets at night to help prevent constipation. He can also use hydrocortisone 2.5% cream applied twice daily as needed to his hemorrhoids which she is suffering with due to constipation  11/27/20 I am always very impressed by this family.  Wife does an amazing job caring for the patient.  He is able to walk using a walker.  However he is unable to pull his pants down or clean himself after using the bathroom.  His wife does this for him every trip to the bathroom.  She continues to do an amazing job caring for him.  He is still walking using a walker.  He has not fallen since his last visit.  His wife credits the walker which seems to give him additional stability.  His memory loss seems relatively stable.  He does occasionally have delusions that there are bugs crawling on his skin or in his ear canals.  However his hallucinations have not worsened.  His memory loss has not worsened.  His back pain has improved.  Wife just occasionally has to give him Tylenol and this is sufficient to control his back pain.  He is using fludrocortisone 0.1 mg tablets once a day and this  is sufficient to control his blood pressure.  His wife is stop checking his blood pressure and he is not complaining of any symptoms of orthostatic hypotension.  He has not had any syncope or near syncope.  She seldom has to give him midodrine.  Past Medical History:  Diagnosis Date  . B12 deficiency    Borderline  . Bulging discs   . Degenerative disc disease, cervical   . Gastritis   . Hyperlipidemia   . Neuropathy   . Parkinson's disease ([place])   . Parkinsonian syndrome ([place])   . Peripheral neuropathy   . TIA (transient ischemic attack)    TIA symptoms from hypotension   Past Surgical  History:  Procedure Laterality Date  . APPENDECTOMY    . BACK SURGERY     Current Outpatient Medications on File Prior to Visit  Medication Sig Dispense Refill  . acetaminophen (TYLENOL) 500 MG tablet Take 2 tablets (1,000 mg total) by mouth 3 (three) times daily. 30 tablet 0  . carbidopa-levodopa (SINEMET IR) 25-100 MG tablet Take 2.5 tablets by mouth See admin instructions. Take 2.5 tablets by mouth three times daily- 7 AM, 1 PM, and 7 PM    . Carbidopa-Levodopa ER (SINEMET CR) 25-100 MG tablet controlled release Take 1 tablet by mouth at bedtime.  2  . cholecalciferol (VITAMIN D3) 25 MCG (1000 UNIT) tablet Take 2,000 Units by mouth in the morning.     . Cyanocobalamin (VITAMIN B 12 PO) Take 1 tablet by mouth in the morning.     . donepezil (ARICEPT) 5 MG tablet Take 5 mg by mouth at bedtime.     . DULoxetine (CYMBALTA) 60 MG capsule TAKE 1 CAPSULE(60 MG) BY MOUTH DAILY 90 capsule 3  . fludrocortisone (FLORINEF) 0.1 MG tablet TAKE 2 TABLETS BY MOUTH DAILY. GENERIC EQUIVALENT FOR FLORINEF. 180 tablet 0  . hydrocortisone 2.5 % cream Apply topically 2 (two) times daily. 30 g 0  . Menthol, Topical Analgesic, (BIOFREEZE) 4 % GEL Apply 1 application topically See admin instructions. Apply topically to painful sites as directed by MD    . midodrine (PROAMATINE) 10 MG tablet Take 1 tablet (10 mg total) by mouth 3 (three) times daily. (Patient taking differently: Take 10 mg by mouth See admin instructions. Take 10 mg by mouth three times a day- 7 AM, 1 PM, and 7 PM) 270 tablet 3  . polyethylene glycol powder (GLYCOLAX/MIRALAX) 17 GM/SCOOP powder Take 17 g by mouth See admin instructions. Mix 17 grams of powder into 4-8 ounces of prune juice and drink once a day    . pravastatin (PRAVACHOL) 20 MG tablet TAKE 1 TABLET BY MOUTH DAILY AT 6 PM GENERIC EQUIVALENT FOR PRAVACHOL 90 tablet 2  . senna-docusate (SENOKOT-S) 8.6-50 MG tablet Take 2 tablets by mouth at bedtime as needed for mild constipation. 60  tablet 2   No current facility-administered medications on file prior to visit.   Allergies  Allergen Reactions  . Tape Other (See Comments)    SKIN IS VERY THIN- TEARS VERY EASILY!!  . Hydrocodone-Acetaminophen Other (See Comments)    Hallucinations and confusion  . Indomethacin Other (See Comments)    Unknown reaction  . Lyrica [Pregabalin] Other (See Comments)    Vivid hallucinations  . Quetiapine Other (See Comments)    HYPOtension!!   Social History   Socioeconomic History  . Marital status: Married    Spouse name: Not on file  . Number of children: 3  . Years of education:  Not on file  . Highest education level: High school graduate  Occupational History  . Not on file  Tobacco Use  . Smoking status: Never Smoker  . Smokeless tobacco: Never Used  Vaping Use  . Vaping Use: Never used  Substance and Sexual Activity  . Alcohol use: No  . Drug use: No  . Sexual activity: Not on file  Other Topics Concern  . Not on file  Social History Narrative   Lives at home with his wife   Right handed   Caffeine: none   Social Determinants of Health   Financial Resource Strain: Not on file  Food Insecurity: Not on file  Transportation Needs: Not on file  Physical Activity: Not on file  Stress: Not on file  Social Connections: Not on file  Intimate Partner Violence: Not on file      Review of Systems  All other systems reviewed and are negative.      Objective:   Physical Exam Vitals reviewed.  Constitutional:      General: He is not in acute distress.    Appearance: Normal appearance. He is not ill-appearing or toxic-appearing.  Cardiovascular:     Rate and Rhythm: Normal rate and regular rhythm.     Pulses: Normal pulses.     Heart sounds: Normal heart sounds. No murmur heard.   Pulmonary:     Effort: Pulmonary effort is normal. No respiratory distress.     Breath sounds: Normal breath sounds. No wheezing, rhonchi or rales.  Abdominal:     General:  Abdomen is flat. Bowel sounds are normal. There is no distension.     Palpations: Abdomen is soft. There is no mass.     Tenderness: There is no abdominal tenderness. There is no guarding or rebound.     Hernia: No hernia is present.  Musculoskeletal:     Right lower leg: No edema.     Left lower leg: No edema.  Skin:    Findings: No erythema or rash.  Neurological:     Mental Status: He is alert.           Assessment & Plan:  Primary parkinsonism ([place]) - Plan: CBC with Differential/Platelet, COMPLETE METABOLIC PANEL WITH GFR  Patient seems very stable since his last visit.  His blood pressure today is acceptable.  Continue to use a reduced dose of fludrocortisone of 0.1 mg daily.  Supplement with midodrine only if the patient is hypotensive and symptomatic.  She can use midodrine up to 3 times a day if necessary.  Continue to encourage physical therapy and using the walker to help prevent falls and deconditioning.  We will make no additional changes in his Sinemet or his medication at the present time.  We will check a CBC and a CMP.

## 2020-12-17 ENCOUNTER — Telehealth: Payer: Self-pay | Admitting: Family Medicine

## 2020-12-17 NOTE — Progress Notes (Signed)
  Chronic Care Management   Outreach Note  12/17/2020 Name: Alex Frazier MRN: [id number] DOB: [birth year]  Referred by: Susy Frizzle, MD Reason for referral : No chief complaint on file.   An unsuccessful telephone outreach was attempted today. The patient was referred to the pharmacist for assistance with care management and care coordination.   Follow Up Plan:   Carley Perdue [employer] Scheduler

## 2020-12-25 ENCOUNTER — Telehealth: Payer: Self-pay | Admitting: Family Medicine

## 2020-12-25 NOTE — Progress Notes (Signed)
  Chronic Care Management   Note  12/25/2020 Name: NIKE SOUTHWELL MRN: [id number] DOB: May 24, 1946  KEBRON PULSE is a 75 y.o. year old male who is a primary care patient of Pickard, Cammie Mcgee, MD. I reached out to Shirline Frees by phone today in response to a referral sent by Mr. Sonam Huelsmann Delahoz's PCP, Susy Frizzle, MD.   Mr. Nagy was given information about Chronic Care Management services today including:  1. CCM service includes personalized support from designated clinical staff supervised by his physician, including individualized plan of care and coordination with other care providers 2. 24/7 contact phone numbers for assistance for urgent and routine care needs. 3. Service will only be billed when office clinical staff spend 20 minutes or more in a month to coordinate care. 4. Only one practitioner may furnish and bill the service in a calendar month. 5. The patient may stop CCM services at any time (effective at the end of the month) by phone call to the office staff.   Patient wishes to consider information provided and/or speak with a member of the care team before deciding about enrollment in care management services.   Follow up plan:   Carley Perdue [employer] Scheduler

## 2021-01-01 DIAGNOSIS — H524 Presbyopia: Secondary | ICD-10-CM | POA: Diagnosis not present

## 2021-01-01 DIAGNOSIS — H26493 Other secondary cataract, bilateral: Secondary | ICD-10-CM | POA: Diagnosis not present

## 2021-01-01 DIAGNOSIS — E119 Type 2 diabetes mellitus without complications: Secondary | ICD-10-CM | POA: Diagnosis not present

## 2021-01-01 DIAGNOSIS — H532 Diplopia: Secondary | ICD-10-CM | POA: Diagnosis not present

## 2021-01-01 LAB — HM DIABETES EYE EXAM

## 2021-01-02 ENCOUNTER — Encounter: Payer: Self-pay | Admitting: *Deleted

## 2021-01-03 DIAGNOSIS — Z23 Encounter for immunization: Secondary | ICD-10-CM | POA: Diagnosis not present

## 2021-03-01 DIAGNOSIS — Z79899 Other long term (current) drug therapy: Secondary | ICD-10-CM | POA: Diagnosis not present

## 2021-03-01 DIAGNOSIS — G20 Parkinson's disease: Secondary | ICD-10-CM | POA: Diagnosis not present

## 2021-03-08 ENCOUNTER — Other Ambulatory Visit: Payer: Self-pay

## 2021-03-08 ENCOUNTER — Ambulatory Visit (INDEPENDENT_AMBULATORY_CARE_PROVIDER_SITE_OTHER): Payer: Medicare Other

## 2021-03-08 DIAGNOSIS — Z0000 Encounter for general adult medical examination without abnormal findings: Secondary | ICD-10-CM | POA: Diagnosis not present

## 2021-03-08 NOTE — Progress Notes (Signed)
Subjective:   Alex Frazier is a 75 y.o. male who presents for an Initial Medicare Annual Wellness Visit.   I connected with Blythe Stanford  today by telephone and verified that I am speaking with the correct person using two identifiers. Location patient: home Location provider: work Persons participating in the virtual visit: patient, provider.   I discussed the limitations, risks, security and privacy concerns of performing an evaluation and management service by telephone and the availability of in person appointments. I also discussed with the patient that there may be a patient responsible charge related to this service. The patient expressed understanding and verbally consented to this telephonic visit.    Interactive audio and video telecommunications were attempted between this provider and patient, however failed, due to patient having technical difficulties OR patient did not have access to video capability.  We continued and completed visit with audio only.      Review of Systems    N/A  Cardiac Risk Factors include: advanced age (>41men, >75 women);dyslipidemia;male gender     Objective:    Today's Vitals   03/08/21 1446  PainSc: 3    There is no height or weight on file to calculate BMI.  Advanced Directives 03/08/2021 03/17/2020 10/16/2019 02/12/2017 03/07/2016  Does Patient Have a Medical Advance Directive? Yes Yes No Yes Yes  Type of Paramedic of [place];Living will [place];Living will - [place];Living will [place];Living will  Does patient want to make changes to medical advance directive? No - Patient declined No - Guardian declined - - Yes - information given  Copy of [place] in Chart? No - copy requested No - copy requested - - -    Current Medications (verified) Outpatient Encounter Medications as of 03/08/2021  Medication Sig   acetaminophen (TYLENOL)  500 MG tablet Take 2 tablets (1,000 mg total) by mouth 3 (three) times daily.   carbidopa-levodopa (SINEMET IR) 25-100 MG tablet Take 2.5 tablets by mouth See admin instructions. Take 2.5 tablets by mouth three times daily- 7 AM, 1 PM, and 7 PM   cholecalciferol (VITAMIN D3) 25 MCG (1000 UNIT) tablet Take 2,000 Units by mouth in the morning.    Cyanocobalamin (VITAMIN B 12 PO) Take 1 tablet by mouth in the morning.    donepezil (ARICEPT) 5 MG tablet Take 5 mg by mouth at bedtime.    DULoxetine (CYMBALTA) 60 MG capsule TAKE 1 CAPSULE(60 MG) BY MOUTH DAILY   fludrocortisone (FLORINEF) 0.1 MG tablet TAKE 2 TABLETS BY MOUTH DAILY. GENERIC EQUIVALENT FOR FLORINEF.   MELATONIN ER PO Take 15 mg by mouth.   Menthol, Topical Analgesic, (BIOFREEZE) 4 % GEL Apply 1 application topically See admin instructions. Apply topically to painful sites as directed by MD   midodrine (PROAMATINE) 10 MG tablet Take 1 tablet (10 mg total) by mouth 3 (three) times daily. (Patient taking differently: Take 10 mg by mouth See admin instructions. Take 10 mg by mouth three times a day- 7 AM, 1 PM, and 7 PM)   polyethylene glycol powder (GLYCOLAX/MIRALAX) 17 GM/SCOOP powder Take 17 g by mouth See admin instructions. Mix 17 grams of powder into 4-8 ounces of prune juice and drink once a day   pravastatin (PRAVACHOL) 20 MG tablet TAKE 1 TABLET BY MOUTH DAILY AT 6 PM GENERIC EQUIVALENT FOR PRAVACHOL   [DISCONTINUED] carbidopa-levodopa (SINEMET IR) 25-100 MG tablet Take by mouth.   hydrocortisone 2.5 %  cream Apply topically 2 (two) times daily. (Patient not taking: Reported on 03/08/2021)   senna-docusate (SENOKOT-S) 8.6-50 MG tablet Take 2 tablets by mouth at bedtime as needed for mild constipation. (Patient not taking: Reported on 03/08/2021)   No facility-administered encounter medications on file as of 03/08/2021.    Allergies (verified) Tape, Hydrocodone-acetaminophen, Indomethacin, Lyrica [pregabalin], and Quetiapine    History: Past Medical History:  Diagnosis Date   B12 deficiency    Borderline   Bulging discs    Degenerative disc disease, cervical    Gastritis    Hyperlipidemia    Neuropathy    Parkinson's disease ([facility])    Parkinsonian syndrome ([facility])    Peripheral neuropathy    TIA (transient ischemic attack)    TIA symptoms from hypotension   Past Surgical History:  Procedure Laterality Date   APPENDECTOMY     BACK SURGERY     Family History  Problem Relation Age of Onset   Parkinson's disease Mother    Parkinson's disease Father    Diabetes Sister    Heart disease Brother    Social History   Socioeconomic History   Marital status: Married    Spouse name: Not on file   Number of children: 3   Years of education: Not on file   Highest education level: High school graduate  Occupational History   Not on file  Tobacco Use   Smoking status: Never   Smokeless tobacco: Never  Vaping Use   Vaping Use: Never used  Substance and Sexual Activity   Alcohol use: No   Drug use: No   Sexual activity: Not on file  Other Topics Concern   Not on file  Social History Narrative   Lives at home with his wife   Right handed   Caffeine: none   Social Determinants of Health   Financial Resource Strain: Low Risk    Difficulty of Paying Living Expenses: Not hard at all  Food Insecurity: No Food Insecurity   Worried About Charity fundraiser in the Last Year: Never true   [employer] in the Last Year: Never true  Transportation Needs: No Transportation Needs   Lack of Transportation (Medical): No   Lack of Transportation (Non-Medical): No  Physical Activity: Inactive   Days of Exercise per Week: 0 days   Minutes of Exercise per Session: 0 min  Stress: No Stress Concern Present   Feeling of Stress : Not at all  Social Connections: Moderately Integrated   Frequency of Communication with Friends and Family: More than three times a week   Frequency of Social Gatherings with  Friends and Family: More than three times a week   Attends Religious Services: 1 to 4 times per year   Active Member of [employer] or Organizations: No   Attends Music therapist: Never   Marital Status: Married    Tobacco Counseling Counseling given: Not Answered   Clinical Intake:  Pre-visit preparation completed: Yes  Pain : 0-10 Pain Score: 3  Pain Type: Chronic pain Pain Location:  (feet, back) Pain Descriptors / Indicators: Tingling, Numbness Pain Onset: More than a month ago Pain Frequency: Constant Pain Relieving Factors: Tylenol  Pain Relieving Factors: Tylenol  Nutritional Risks: None Diabetes: No CBG done?: No Did pt. bring in CBG monitor from home?: No  How often do you need to have someone help you when you read instructions, pamphlets, or other written materials from your doctor or pharmacy?: 4 - Often  What is the last grade level you completed in school?: 12th grade  Diabetic?No   Interpreter Needed?: No  Information entered by :: [place] of Daily Living In your present state of health, do you have any difficulty performing the following activities: 03/08/2021 03/17/2020  Hearing? Tempie Donning  Vision? N N  Difficulty concentrating or making decisions? Y Y  Comment - sometimes  Walking or climbing stairs? Y Y  Dressing or bathing? Y Y  Doing errands, shopping? Tempie Donning  Preparing Food and eating ? Y -  Comment does not prepare foods but able to feed himself -  Using the Toilet? Y -  Comment needs assistance wiping and pulling pants up -  In the past six months, have you accidently leaked urine? Y -  Comment has nocturnal bladder leakage -  Do you have problems with loss of bowel control? Y -  Comment has occassional stool accidents -  Managing your Medications? Y -  Managing your Finances? Y -  Housekeeping or managing your Housekeeping? Y -  Some recent data might be hidden    Patient Care Team: Susy Frizzle, MD as PCP -  General (Family Medicine) Nahser, Wonda Cheng, MD as PCP - Cardiology (Cardiology)  Indicate any recent Medical Services you may have received from other than [facility] providers in the past year (date may be approximate).     Assessment:   This is a routine wellness examination for [place].  Hearing/Vision screen Vision Screening - Comments:: Patient gets eye exams once per year. Wears reading glasses   Dietary issues and exercise activities discussed: Current Exercise Habits: The patient does not participate in regular exercise at present, Exercise limited by: neurologic condition(s)   Goals Addressed             This Visit's Progress    Prevent falls         Depression Screen PHQ 2/9 Scores 03/08/2021 09/23/2018 08/25/2017 01/31/2016  PHQ - 2 Score 0 0 0 -  Exception Documentation - - - Medical reason    Fall Risk Fall Risk  03/08/2021 09/23/2018 07/26/2018 08/25/2017 08/13/2017  Falls in the past year? 1 1 1  No Yes  Comment - - Emmi Telephone Survey: data to providers prior to load - Emmi Telephone Survey: data to providers prior to load  Number falls in past yr: 1 1 1  - 2 or more  Comment - - Emmi Telephone Survey Actual Response = 10 - Emmi Telephone Survey Actual Response = 15  Injury with Fall? 1 - 1 - Yes  Risk for fall due to : Impaired balance/gait;History of fall(s);Medication side effect;Other (Comment) - - - -  Risk for fall due to: Comment Hypotension - - - -  Follow up Falls evaluation completed;Falls prevention discussed Falls evaluation completed - - -    FALL RISK PREVENTION PERTAINING TO THE HOME:  Any stairs in or around the home? Yes  If so, are there any without handrails? No  Home free of loose throw rugs in walkways, pet beds, electrical cords, etc? Yes  Adequate lighting in your home to reduce risk of falls? Yes   ASSISTIVE DEVICES UTILIZED TO PREVENT FALLS:  Life alert? No  Use of a cane, walker or w/c? Yes  Grab bars in the bathroom? Yes  Shower chair  or bench in shower? Yes  Elevated toilet seat or a handicapped toilet? Yes     Cognitive Function:  Normal cognitive status assessed by direct  observation by this Nurse Health Advisor. No abnormalities found.        Immunizations Immunization History  Administered Date(s) Administered   Fluad Quad(high Dose 65+) 05/31/2019   Influenza Split 06/02/2012   Influenza Whole 07/09/2009   Influenza, High Dose Seasonal PF 06/25/2017, 06/09/2018   Influenza,inj,Quad PF,6+ Mos 06/16/2013, 06/29/2014, 06/28/2015, 06/26/2016   PFIZER(Purple Top)SARS-COV-2 Vaccination 10/15/2019, 11/08/2019   Pneumococcal Conjugate-13 10/18/2013   Pneumococcal Polysaccharide-23 07/15/2011   Td 09/09/2003, 04/29/2011   Zoster, Live 01/21/2011    TDAP status: Up to date  Flu Vaccine status: Up to date  Pneumococcal vaccine status: Up to date  Covid-19 vaccine status: Completed vaccines  Qualifies for Shingles Vaccine? Yes   Zostavax completed Yes   Shingrix Completed?: No.    Education has been provided regarding the importance of this vaccine. Patient has been advised to call insurance company to determine out of pocket expense if they have not yet received this vaccine. Advised may also receive vaccine at local pharmacy or Health Dept. Verbalized acceptance and understanding.  Screening Tests Health Maintenance  Topic Date Due   Zoster Vaccines- Shingrix (1 of 2) Never done   COLONOSCOPY (Pts 45-8yrs Insurance coverage will need to be confirmed)  12/08/2018   COVID-19 Vaccine (3 - Pfizer risk series) 12/06/2019   INFLUENZA VACCINE  04/08/2021   TETANUS/TDAP  04/28/2021   Hepatitis C Screening  Completed   PNA vac Low Risk Adult  Completed   HPV VACCINES  Aged Out    Health Maintenance  Health Maintenance Due  Topic Date Due   Zoster Vaccines- Shingrix (1 of 2) Never done   COLONOSCOPY (Pts 45-72yrs Insurance coverage will need to be confirmed)  12/08/2018   COVID-19 Vaccine (3 - Pfizer  risk series) 12/06/2019    Colorectal cancer screening: Type of screening: Colonoscopy. Completed 12/07/2008. Repeat every 10 years  Lung Cancer Screening: (Low Dose CT Chest recommended if Age 68-80 years, 30 pack-year currently smoking OR have quit w/in 15years.) does not qualify.   Lung Cancer Screening Referral: N/A   Additional Screening:  Hepatitis C Screening: does qualify; Completed 09/08/2016  Vision Screening: Recommended annual ophthalmology exams for early detection of glaucoma and other disorders of the eye. Is the patient up to date with their annual eye exam?  Yes  Who is the provider or what is the name of the office in which the patient attends annual eye exams? Dr. Satira Sark  If pt is not established with a provider, would they like to be referred to a provider to establish care? No .   Dental Screening: Recommended annual dental exams for proper oral hygiene  Community Resource Referral / Chronic Care Management: CRR required this visit?  No   CCM required this visit?  No      Plan:     I have personally reviewed and noted the following in the patient's chart:   Medical and social history Use of alcohol, tobacco or illicit drugs  Current medications and supplements including opioid prescriptions. Patient is not currently taking opioid prescriptions. Functional ability and status Nutritional status Physical activity Advanced directives List of other physicians Hospitalizations, surgeries, and ER visits in previous 12 months Vitals Screenings to include cognitive, depression, and falls Referrals and appointments  In addition, I have reviewed and discussed with patient certain preventive protocols, quality metrics, and best practice recommendations. A written personalized care plan for preventive services as well as general preventive health recommendations were provided to patient.  Ofilia Neas, LPN   2/0/6015   Nurse Notes: None

## 2021-03-08 NOTE — Patient Instructions (Signed)
Alex Frazier , Thank you for taking time to come for your Medicare Wellness Visit. I appreciate your ongoing commitment to your health goals. Please review the following plan we discussed and let me know if I can assist you in the future.   Screening recommendations/referrals: Colonoscopy: No longer required  Recommended yearly ophthalmology/optometry visit for glaucoma screening and checkup Recommended yearly dental visit for hygiene and checkup  Vaccinations: Influenza vaccine: Up to date, next due fall 2022  Pneumococcal vaccine: Completed series  Tdap vaccine: Currently due, you may await and injury to receive  Shingles vaccine: Currently due for Shingrix, if you would like to receive you may do so at your local pharmacy    Advanced directives: Please bring copies of your advanced medical directives so that we may scan into your chart.  Conditions/risks identified: None   Next appointment: 05/30/2021 @ 2:00 PM with Dr. Dennard Schaumann  Preventive Care 65 Years and Older, Male Preventive care refers to lifestyle choices and visits with your health care provider that can promote health and wellness. What does preventive care include? A yearly physical exam. This is also called an annual well check. Dental exams once or twice a year. Routine eye exams. Ask your health care provider how often you should have your eyes checked. Personal lifestyle choices, including: Daily care of your teeth and gums. Regular physical activity. Eating a healthy diet. Avoiding tobacco and drug use. Limiting alcohol use. Practicing safe sex. Taking low doses of aspirin every day. Taking vitamin and mineral supplements as recommended by your health care provider. What happens during an annual well check? The services and screenings done by your health care provider during your annual well check will depend on your age, overall health, lifestyle risk factors, and family history of disease. Counseling  Your  health care provider may ask you questions about your: Alcohol use. Tobacco use. Drug use. Emotional well-being. Home and relationship well-being. Sexual activity. Eating habits. History of falls. Memory and ability to understand (cognition). Work and work Statistician. Screening  You may have the following tests or measurements: Height, weight, and BMI. Blood pressure. Lipid and cholesterol levels. These may be checked every 5 years, or more frequently if you are over 21 years old. Skin check. Lung cancer screening. You may have this screening every year starting at age 60 if you have a 30-pack-year history of smoking and currently smoke or have quit within the past 15 years. Fecal occult blood test (FOBT) of the stool. You may have this test every year starting at age 54. Flexible sigmoidoscopy or colonoscopy. You may have a sigmoidoscopy every 5 years or a colonoscopy every 10 years starting at age 32. Prostate cancer screening. Recommendations will vary depending on your family history and other risks. Hepatitis C blood test. Hepatitis B blood test. Sexually transmitted disease (STD) testing. Diabetes screening. This is done by checking your blood sugar (glucose) after you have not eaten for a while (fasting). You may have this done every 1-3 years. Abdominal aortic aneurysm (AAA) screening. You may need this if you are a current or former smoker. Osteoporosis. You may be screened starting at age 59 if you are at high risk. Talk with your health care provider about your test results, treatment options, and if necessary, the need for more tests. Vaccines  Your health care provider may recommend certain vaccines, such as: Influenza vaccine. This is recommended every year. Tetanus, diphtheria, and acellular pertussis (Tdap, Td) vaccine. You may need a Td  booster every 10 years. Zoster vaccine. You may need this after age 52. Pneumococcal 13-valent conjugate (PCV13) vaccine. One dose  is recommended after age 79. Pneumococcal polysaccharide (PPSV23) vaccine. One dose is recommended after age 95. Talk to your health care provider about which screenings and vaccines you need and how often you need them. This information is not intended to replace advice given to you by your health care provider. Make sure you discuss any questions you have with your health care provider. Document Released: 09/21/2015 Document Revised: 05/14/2016 Document Reviewed: 06/26/2015 Elsevier Interactive Patient Education  2017 [place] Prevention in the Home Falls can cause injuries. They can happen to people of all ages. There are many things you can do to make your home safe and to help prevent falls. What can I do on the outside of my home? Regularly fix the edges of walkways and driveways and fix any cracks. Remove anything that might make you trip as you walk through a door, such as a raised step or threshold. Trim any bushes or trees on the path to your home. Use bright outdoor lighting. Clear any walking paths of anything that might make someone trip, such as rocks or tools. Regularly check to see if handrails are loose or broken. Make sure that both sides of any steps have handrails. Any raised decks and porches should have guardrails on the edges. Have any leaves, snow, or ice cleared regularly. Use sand or salt on walking paths during winter. Clean up any spills in your garage right away. This includes oil or grease spills. What can I do in the bathroom? Use night lights. Install grab bars by the toilet and in the tub and shower. Do not use towel bars as grab bars. Use non-skid mats or decals in the tub or shower. If you need to sit down in the shower, use a plastic, non-slip stool. Keep the floor dry. Clean up any water that spills on the floor as soon as it happens. Remove soap buildup in the tub or shower regularly. Attach bath mats securely with double-sided non-slip rug  tape. Do not have throw rugs and other things on the floor that can make you trip. What can I do in the bedroom? Use night lights. Make sure that you have a light by your bed that is easy to reach. Do not use any sheets or blankets that are too big for your bed. They should not hang down onto the floor. Have a firm chair that has side arms. You can use this for support while you get dressed. Do not have throw rugs and other things on the floor that can make you trip. What can I do in the kitchen? Clean up any spills right away. Avoid walking on wet floors. Keep items that you use a lot in easy-to-reach places. If you need to reach something above you, use a strong step stool that has a grab bar. Keep electrical cords out of the way. Do not use floor polish or wax that makes floors slippery. If you must use wax, use non-skid floor wax. Do not have throw rugs and other things on the floor that can make you trip. What can I do with my stairs? Do not leave any items on the stairs. Make sure that there are handrails on both sides of the stairs and use them. Fix handrails that are broken or loose. Make sure that handrails are as long as the stairways. Check any carpeting  to make sure that it is firmly attached to the stairs. Fix any carpet that is loose or worn. Avoid having throw rugs at the top or bottom of the stairs. If you do have throw rugs, attach them to the floor with carpet tape. Make sure that you have a light switch at the top of the stairs and the bottom of the stairs. If you do not have them, ask someone to add them for you. What else can I do to help prevent falls? Wear shoes that: Do not have high heels. Have rubber bottoms. Are comfortable and fit you well. Are closed at the toe. Do not wear sandals. If you use a stepladder: Make sure that it is fully opened. Do not climb a closed stepladder. Make sure that both sides of the stepladder are locked into place. Ask someone to  hold it for you, if possible. Clearly mark and make sure that you can see: Any grab bars or handrails. First and last steps. Where the edge of each step is. Use tools that help you move around (mobility aids) if they are needed. These include: Canes. Walkers. Scooters. Crutches. Turn on the lights when you go into a dark area. Replace any light bulbs as soon as they burn out. Set up your furniture so you have a clear path. Avoid moving your furniture around. If any of your floors are uneven, fix them. If there are any pets around you, be aware of where they are. Review your medicines with your doctor. Some medicines can make you feel dizzy. This can increase your chance of falling. Ask your doctor what other things that you can do to help prevent falls. This information is not intended to replace advice given to you by your health care provider. Make sure you discuss any questions you have with your health care provider. Document Released: 06/21/2009 Document Revised: 01/31/2016 Document Reviewed: 09/29/2014 Elsevier Interactive Patient Education  2017 [employer].

## 2021-03-28 ENCOUNTER — Other Ambulatory Visit: Payer: Self-pay | Admitting: Family Medicine

## 2021-05-21 ENCOUNTER — Other Ambulatory Visit: Payer: Self-pay | Admitting: Family Medicine

## 2021-05-21 ENCOUNTER — Encounter: Payer: Self-pay | Admitting: Family Medicine

## 2021-05-21 DIAGNOSIS — E785 Hyperlipidemia, unspecified: Secondary | ICD-10-CM

## 2021-05-30 ENCOUNTER — Other Ambulatory Visit: Payer: Self-pay

## 2021-05-30 ENCOUNTER — Ambulatory Visit (INDEPENDENT_AMBULATORY_CARE_PROVIDER_SITE_OTHER): Payer: Medicare Other | Admitting: Family Medicine

## 2021-05-30 ENCOUNTER — Encounter: Payer: Self-pay | Admitting: Family Medicine

## 2021-05-30 VITALS — BP 98/56 | HR 72 | Temp 98.5°F | Resp 16

## 2021-05-30 DIAGNOSIS — G20 Parkinson's disease: Secondary | ICD-10-CM

## 2021-05-30 DIAGNOSIS — I959 Hypotension, unspecified: Secondary | ICD-10-CM

## 2021-05-30 DIAGNOSIS — Z23 Encounter for immunization: Secondary | ICD-10-CM

## 2021-05-30 MED ORDER — MIDODRINE HCL 10 MG PO TABS: 10.0000 mg | ORAL_TABLET | Freq: Three times a day (TID) | ORAL | 3 refills | Status: DC

## 2021-05-30 NOTE — Progress Notes (Signed)
Subjective:    Patient ID: Alex Frazier, male    DOB: 07-Jan-1946, 75 y.o.   MRN: [id number]  HPI Unfortunately, the patient situation continues to decompensate.  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  His wife is finding that his blood pressures frequently 47-42 systolic over 59-56 diastolic.  At other times it can be completely normal.  Therefore she started back on the midodrine.  She is gradually increased to 5 mg in the morning and 5 mg around lunchtime and this is helped however he continues to have systolic blood pressure 38-756.  She denies any fainting spells or syncope or chest pain or shortness of breath.  He does continue to hallucinate.  The blood pressures make it harder for him to help stand up to go to the bathroom or stand up to leave the dinner table.  Therefore she is physically having to carry him to the wheelchair which is difficult for her  Past Medical History:  Diagnosis Date   B12 deficiency    Borderline   Bulging discs    Degenerative disc disease, cervical    Gastritis    Hyperlipidemia    Neuropathy    Parkinson's disease ([place])    Parkinsonian syndrome ([place])    Peripheral neuropathy    TIA (transient ischemic attack)    TIA symptoms from hypotension   Past Surgical History:  Procedure Laterality Date   APPENDECTOMY     BACK SURGERY     Current Outpatient Medications on File Prior to Visit  Medication Sig Dispense Refill   acetaminophen (TYLENOL) 500 MG tablet Take 2 tablets (1,000 mg total) by mouth 3 (three) times daily. 30 tablet 0   carbidopa-levodopa (SINEMET IR) 25-100 MG tablet Take 2 tablets by mouth See admin instructions. Take 2 tablets by mouth three times daily- 7 AM, 1 PM, and 7 PM     cholecalciferol (VITAMIN D3) 25 MCG (1000 UNIT) tablet Take 2,000 Units by mouth in the morning.      Cyanocobalamin (VITAMIN B 12 PO) Take 1 tablet by mouth in the morning.      donepezil (ARICEPT) 5 MG tablet Take 5 mg by mouth at bedtime.       DULoxetine (CYMBALTA) 60 MG capsule TAKE 1 CAPSULE(60 MG) BY MOUTH DAILY 90 capsule 3   fludrocortisone (FLORINEF) 0.1 MG tablet TAKE 2 TABLETS BY MOUTH DAILY. GENERIC EQUIVALENT FOR FLORINEF. 180 tablet 0   hydrocortisone 2.5 % cream Apply topically 2 (two) times daily. 30 g 0   MELATONIN ER PO Take 15 mg by mouth.     Menthol, Topical Analgesic, (BIOFREEZE) 4 % GEL Apply 1 application topically See admin instructions. Apply topically to painful sites as directed by MD     midodrine (PROAMATINE) 10 MG tablet Take 1 tablet (10 mg total) by mouth 3 (three) times daily. (Patient taking differently: Take 10 mg by mouth See admin instructions. Take 10 mg by mouth three times a day- 7 AM, 1 PM, and 7 PM) 270 tablet 3   polyethylene glycol powder (GLYCOLAX/MIRALAX) 17 GM/SCOOP powder Take 17 g by mouth See admin instructions. Mix 17 grams of powder into 4-8 ounces of prune juice and drink once a day     pravastatin (PRAVACHOL) 20 MG tablet TAKE 1 TABLET BY MOUTH DAILY AT 6PM. GENERIC EQUIVALENT FOR PRAVACHOL 90 tablet 2   senna-docusate (SENOKOT-S) 8.6-50 MG tablet Take 2 tablets by mouth at bedtime as needed for mild constipation. 60 tablet 2   No  current facility-administered medications on file prior to visit.   Allergies  Allergen Reactions   Tape Other (See Comments)    SKIN IS VERY THIN- TEARS VERY EASILY!!   Hydrocodone-Acetaminophen Other (See Comments)    Hallucinations and confusion   Indomethacin Other (See Comments)    Unknown reaction   Lyrica [Pregabalin] Other (See Comments)    Vivid hallucinations   Quetiapine Other (See Comments)    HYPOtension!!   Social History   Socioeconomic History   Marital status: Married    Spouse name: Not on file   Number of children: 3   Years of education: Not on file   Highest education level: High school graduate  Occupational History   Not on file  Tobacco Use   Smoking status: Never   Smokeless tobacco: Never  Vaping Use   Vaping Use:  Never used  Substance and Sexual Activity   Alcohol use: No   Drug use: No   Sexual activity: Not on file  Other Topics Concern   Not on file  Social History Narrative   Lives at home with his wife   Right handed   Caffeine: none   Social Determinants of Health   Financial Resource Strain: Low Risk    Difficulty of Paying Living Expenses: Not hard at all  Food Insecurity: No Food Insecurity   Worried About Charity fundraiser in the Last Year: Never true   [place] in the Last Year: Never true  Transportation Needs: No Transportation Needs   Lack of Transportation (Medical): No   Lack of Transportation (Non-Medical): No  Physical Activity: Inactive   Days of Exercise per Week: 0 days   Minutes of Exercise per Session: 0 min  Stress: No Stress Concern Present   Feeling of Stress : Not at all  Social Connections: Moderately Integrated   Frequency of Communication with Friends and Family: More than three times a week   Frequency of Social Gatherings with Friends and Family: More than three times a week   Attends Religious Services: 1 to 4 times per year   Active Member of [employer] or Organizations: No   Attends Archivist Meetings: Never   Marital Status: Married  Human resources officer Violence: Not At Risk   Fear of Current or Ex-Partner: No   Emotionally Abused: No   Physically Abused: No   Sexually Abused: No      Review of Systems  All other systems reviewed and are negative.      Objective:   Physical Exam Vitals reviewed.  Constitutional:      General: He is not in acute distress.    Appearance: Normal appearance. He is not ill-appearing or toxic-appearing.  Cardiovascular:     Rate and Rhythm: Normal rate and regular rhythm.     Pulses: Normal pulses.     Heart sounds: Normal heart sounds. No murmur heard. Pulmonary:     Effort: Pulmonary effort is normal. No respiratory distress.     Breath sounds: Normal breath sounds. No wheezing, rhonchi or  rales.  Abdominal:     General: Abdomen is flat. Bowel sounds are normal. There is no distension.     Palpations: Abdomen is soft. There is no mass.     Tenderness: There is no abdominal tenderness. There is no guarding or rebound.     Hernia: No hernia is present.  Musculoskeletal:     Right lower leg: No edema.     Left lower  leg: No edema.  Skin:    Findings: No erythema or rash.  Neurological:     Mental Status: He is alert.          Assessment & Plan:  Need for immunization against influenza - Plan: Flu Vaccine QUAD High Dose(Fluad)  Hypotension, unspecified hypotension type - Plan: CBC with Differential/Platelet, COMPLETE METABOLIC PANEL WITH GFR  Primary parkinsonism ([place]) This is been going on now for 3 months.  Therefore I do not feel that the drops in his blood pressure are related to any type of infection.  I recommended increasing midodrine to 10 mg 3 times a day and I recommended that she not check the blood pressure unless he is symptomatic because I feel that is causing her to worry unnecessarily.  Also we have done everything we can to raise his blood pressure at this point and we have no further options for treatment.

## 2021-05-31 LAB — CBC WITH DIFFERENTIAL/PLATELET
Absolute Monocytes: 519 cells/uL (ref 200–950)
Basophils Absolute: 39 cells/uL (ref 0–200)
Basophils Relative: 0.8 %
Eosinophils Absolute: 78 cells/uL (ref 15–500)
Eosinophils Relative: 1.6 %
HCT: 41.9 % (ref 38.5–50.0)
Hemoglobin: 14.2 g/dL (ref 13.2–17.1)
Lymphs Abs: 931 cells/uL (ref 850–3900)
MCH: 29.6 pg (ref 27.0–33.0)
MCHC: 33.9 g/dL (ref 32.0–36.0)
MCV: 87.3 fL (ref 80.0–100.0)
MPV: 10 fL (ref 7.5–12.5)
Monocytes Relative: 10.6 %
Neutro Abs: 3332 cells/uL (ref 1500–7800)
Neutrophils Relative %: 68 %
Platelets: 274 10*3/uL (ref 140–400)
RBC: 4.8 10*6/uL (ref 4.20–5.80)
RDW: 12.6 % (ref 11.0–15.0)
Total Lymphocyte: 19 %
WBC: 4.9 10*3/uL (ref 3.8–10.8)

## 2021-05-31 LAB — COMPLETE METABOLIC PANEL WITH GFR
AG Ratio: 1.7 (calc) (ref 1.0–2.5)
ALT: 9 U/L (ref 9–46)
AST: 17 U/L (ref 10–35)
Albumin: 3.8 g/dL (ref 3.6–5.1)
Alkaline phosphatase (APISO): 108 U/L (ref 35–144)
BUN: 14 mg/dL (ref 7–25)
CO2: 32 mmol/L (ref 20–32)
Calcium: 9.3 mg/dL (ref 8.6–10.3)
Chloride: 102 mmol/L (ref 98–110)
Creat: 0.83 mg/dL (ref 0.70–1.28)
Globulin: 2.2 g/dL (calc) (ref 1.9–3.7)
Glucose, Bld: 108 mg/dL — ABNORMAL HIGH (ref 65–99)
Potassium: 4.4 mmol/L (ref 3.5–5.3)
Sodium: 140 mmol/L (ref 135–146)
Total Bilirubin: 0.5 mg/dL (ref 0.2–1.2)
Total Protein: 6 g/dL — ABNORMAL LOW (ref 6.1–8.1)
eGFR: 92 mL/min/{1.73_m2} (ref >=60)

## 2021-06-28 ENCOUNTER — Telehealth: Payer: Self-pay | Admitting: *Deleted

## 2021-06-28 MED ORDER — NIRMATRELVIR/RITONAVIR (PAXLOVID)TABLET
3.0000 | ORAL_TABLET | Freq: Two times a day (BID) | ORAL | 0 refills | Status: AC
Start: 2021-06-28 — End: 2021-07-03

## 2021-06-28 NOTE — Telephone Encounter (Signed)
Patient tested positive for COVID on 06/27/2021..   Sx began 06/27/2021 and include HA, head and chest congestion, nasal drainage.  Advised to continue symptom management with OTC medications: Tylenol/ Ibuprofen for fever/ body aches, Mucinex/ Delsym for cough/ chest congestion, Afrin/Sudafed/nasal saline for sinus pressure/ nasal congestion.  GFR 92. Order for Paxlovid sent to pharmacy. Advised possible SE include nausea, diarrhea, loss of taste and hypertension.  If chest pain, shortness of breath, fever >104 that is unresponsive to antipyretics noted, or if unable to tolerate fluids, advised to go to ER for evaluation.   Advised that the CDC recommends the following criteria prior to ending isolation in vaccinated and unvaccinated persons: at least 5 days since symptoms onset, then an additional 5 days of masking  AND 3 days fever free without antipyretics (Tylenol or Ibuprofen) AND improvement in respiratory symptoms.

## 2021-07-09 ENCOUNTER — Other Ambulatory Visit: Payer: Self-pay | Admitting: Family Medicine

## 2021-07-20 ENCOUNTER — Other Ambulatory Visit: Payer: Self-pay | Admitting: Family Medicine

## 2021-08-28 DIAGNOSIS — F0280 Dementia in other diseases classified elsewhere without behavioral disturbance: Secondary | ICD-10-CM | POA: Diagnosis not present

## 2021-08-28 DIAGNOSIS — R269 Unspecified abnormalities of gait and mobility: Secondary | ICD-10-CM | POA: Diagnosis not present

## 2021-08-28 DIAGNOSIS — R443 Hallucinations, unspecified: Secondary | ICD-10-CM | POA: Diagnosis not present

## 2021-08-28 DIAGNOSIS — Z79899 Other long term (current) drug therapy: Secondary | ICD-10-CM | POA: Diagnosis not present

## 2021-08-28 DIAGNOSIS — G20 Parkinson's disease: Secondary | ICD-10-CM | POA: Diagnosis not present

## 2021-08-28 DIAGNOSIS — I951 Orthostatic hypotension: Secondary | ICD-10-CM | POA: Diagnosis not present

## 2021-09-10 ENCOUNTER — Telehealth: Payer: Self-pay | Admitting: Family Medicine

## 2021-09-10 NOTE — Telephone Encounter (Signed)
Patient's spouse Horris Latino called to follow up on patient's recent visit to neurologist; patient needs PT again or parkinson's.   Neurologist also recommending occupational therapy after PT.   Requesting in home therapy; difficult to move patient out of home.   Please advise at [phone].

## 2021-09-10 NOTE — Telephone Encounter (Signed)
For Home health - he will need a face to face encounter with Dr Dennard Schaumann   Would like this to be a virtual visit - appt made for video visit 1/9 ay 1200 noon

## 2021-09-16 ENCOUNTER — Telehealth (INDEPENDENT_AMBULATORY_CARE_PROVIDER_SITE_OTHER): Payer: Medicare Other | Admitting: Family Medicine

## 2021-09-16 DIAGNOSIS — Z743 Need for continuous supervision: Secondary | ICD-10-CM | POA: Diagnosis not present

## 2021-09-16 DIAGNOSIS — R55 Syncope and collapse: Secondary | ICD-10-CM | POA: Diagnosis not present

## 2021-09-16 DIAGNOSIS — R296 Repeated falls: Secondary | ICD-10-CM

## 2021-09-16 DIAGNOSIS — R4020 Unspecified coma: Secondary | ICD-10-CM | POA: Diagnosis not present

## 2021-09-16 DIAGNOSIS — G20 Parkinson's disease: Secondary | ICD-10-CM | POA: Diagnosis not present

## 2021-09-16 NOTE — Progress Notes (Signed)
Subjective:    Patient ID: Alex Frazier, male    DOB: [birth year], 76 y.o.   MRN: [id number]  HPI  Patient is being seen today as a video visit.  His wife provides all of the history.  They consented to be seen via video.  Video conference call began at 12:00 due to 1218.  They are currently at home.  I am currently in my office.  This also stems from me making a home visit.  I recently went out to see the patient after he saw his neurologist at [facility].  His Parkinson's disease is worsening.  He has been battling hallucinations for quite some time.  He still feels that there are people outside his home trying to steal them.  His wife is combating that by keeping the blinds closed.  He states that as long as it sounds like it is out of his mind".  This seems to be helping the hallucinations.  He has not tried a longer leave the home like he was last year.  He is now spending majority of his day sleeping.  He will wake up at 7 and take his medications and then typically takes a nap until lunchtime at which point his wife gets him up for good.  However he spends majority of the day in the chair.  He can only walk with maximum assistance using a walker.  He has a brace on his left foot due to foot drop.  He is now dragging his right leg.  His neurologist felt that he may benefit from an AFO on the right foot as well but falls.  Recently he became tangled up over his feet and tripped falling around his multiple Using this.  He did not suffer any serious injuries or hit his head.  However he has become progressively weaker and more decongestion.  His neurologist wants to start back to physical therapy to try to maintain any quality of life the patient may have and to reduce his risk of falls.  At night he sleeps separately from his wife due to his tossing and turning in bed and talking in his sleep.  She is very stressed out.  She is extremely tired and is suffering from caregiver fatigue. Past Medical  History:  Diagnosis Date   B12 deficiency    Borderline   Bulging discs    Degenerative disc disease, cervical    Gastritis    Hyperlipidemia    Neuropathy    Parkinson's disease ([place])    Parkinsonian syndrome ([place])    Peripheral neuropathy    TIA (transient ischemic attack)    TIA symptoms from hypotension   Past Surgical History:  Procedure Laterality Date   APPENDECTOMY     BACK SURGERY     Current Outpatient Medications on File Prior to Visit  Medication Sig Dispense Refill   acetaminophen (TYLENOL) 500 MG tablet Take 2 tablets (1,000 mg total) by mouth 3 (three) times daily. 30 tablet 0   carbidopa-levodopa (SINEMET IR) 25-100 MG tablet Take 2 tablets by mouth See admin instructions. Take 2 tablets by mouth three times daily- 7 AM, 1 PM, and 7 PM     cholecalciferol (VITAMIN D3) 25 MCG (1000 UNIT) tablet Take 2,000 Units by mouth in the morning.      Cyanocobalamin (VITAMIN B 12 PO) Take 1 tablet by mouth in the morning.      donepezil (ARICEPT) 5 MG tablet Take 5 mg by mouth at bedtime.  DULoxetine (CYMBALTA) 60 MG capsule TAKE 1 CAPSULE(60 MG) BY MOUTH DAILY 90 capsule 3   fludrocortisone (FLORINEF) 0.1 MG tablet TAKE 2 TABLETS BY MOUTH DAILY GENERIC EQUIVALENT FOR FLORINEF 180 tablet 0   hydrocortisone 2.5 % cream Apply topically 2 (two) times daily. 30 g 0   MELATONIN ER PO Take 15 mg by mouth.     Menthol, Topical Analgesic, (BIOFREEZE) 4 % GEL Apply 1 application topically See admin instructions. Apply topically to painful sites as directed by MD     midodrine (PROAMATINE) 10 MG tablet Take 1 tablet (10 mg total) by mouth 3 (three) times daily. 270 tablet 3   polyethylene glycol powder (GLYCOLAX/MIRALAX) 17 GM/SCOOP powder Take 17 g by mouth See admin instructions. Mix 17 grams of powder into 4-8 ounces of prune juice and drink once a day     pravastatin (PRAVACHOL) 20 MG tablet TAKE 1 TABLET BY MOUTH DAILY AT 6PM. GENERIC EQUIVALENT FOR PRAVACHOL 90 tablet 2    senna-docusate (SENOKOT-S) 8.6-50 MG tablet Take 2 tablets by mouth at bedtime as needed for mild constipation. 60 tablet 2   No current facility-administered medications on file prior to visit.   Allergies  Allergen Reactions   Tape Other (See Comments)    SKIN IS VERY THIN- TEARS VERY EASILY!!   Hydrocodone-Acetaminophen Other (See Comments)    Hallucinations and confusion   Indomethacin Other (See Comments)    Unknown reaction   Lyrica [Pregabalin] Other (See Comments)    Vivid hallucinations   Quetiapine Other (See Comments)    HYPOtension!!   Social History   Socioeconomic History   Marital status: Married    Spouse name: Not on file   Number of children: 3   Years of education: Not on file   Highest education level: High school graduate  Occupational History   Not on file  Tobacco Use   Smoking status: Never   Smokeless tobacco: Never  Vaping Use   Vaping Use: Never used  Substance and Sexual Activity   Alcohol use: No   Drug use: No   Sexual activity: Not on file  Other Topics Concern   Not on file  Social History Narrative   Lives at home with his wife   Right handed   Caffeine: none   Social Determinants of Health   Financial Resource Strain: Low Risk    Difficulty of Paying Living Expenses: Not hard at all  Food Insecurity: No Food Insecurity   Worried About Charity fundraiser in the Last Year: Never true   [place] in the Last Year: Never true  Transportation Needs: No Transportation Needs   Lack of Transportation (Medical): No   Lack of Transportation (Non-Medical): No  Physical Activity: Inactive   Days of Exercise per Week: 0 days   Minutes of Exercise per Session: 0 min  Stress: No Stress Concern Present   Feeling of Stress : Not at all  Social Connections: Moderately Integrated   Frequency of Communication with Friends and Family: More than three times a week   Frequency of Social Gatherings with Friends and Family: More than three  times a week   Attends Religious Services: 1 to 4 times per year   Active Member of [employer] or Organizations: No   Attends Archivist Meetings: Never   Marital Status: Married  Human resources officer Violence: Not At Risk   Fear of Current or Ex-Partner: No   Emotionally Abused: No   Physically  Abused: No   Sexually Abused: No     Review of Systems  All other systems reviewed and are negative.     Objective:   Physical Exam Constitutional:      Appearance: Normal appearance. He is well-groomed and normal weight. He is not ill-appearing or toxic-appearing.  Cardiovascular:     Rate and Rhythm: Normal rate and regular rhythm.     Heart sounds: Normal heart sounds.  Pulmonary:     Effort: Pulmonary effort is normal.     Breath sounds: Normal breath sounds.  Neurological:     Mental Status: He is alert and oriented to person, place, and time. Mental status is at baseline.     Motor: Weakness present.     Coordination: Coordination abnormal.     Gait: Gait abnormal.  Psychiatric:        Mood and Affect: Mood normal.    Physical exam was conducted during my home visit.  The patient is sitting in a wheelchair.  He can stand with maximum assistance.  He has a difficult time initiating movement.  He has a very slow wide-based gait.  He drags his left foot without his AFO.  He also demonstrates some cogwheel rigidity in his upper extremities.  He has a flat, masklike facies.  He has a decreased blink reflex.  Reaction time is slowed.  He is slow to answer questions although he still demonstrates an excellent sense of humor      Assessment & Plan:  Primary parkinsonism ([place])  Parkinson's disease ([place]) Patient has end-stage Parkinson's disease.  This is complicated by orthostatic hypotension, hallucinations, frequent falls.  He has deconditioning, a left foot drop, he is now dragging his right foot.  Spite using a walker he is a high fall risk.  I agree with his neurologist that he  would benefit from physical therapy however it is very difficult for his wife to get him out of the home as he is unable to physically.  The patient and he has a difficult time walking.  Therefore they would benefit from physical therapy at home.  I will try to arrange this through home health.  He would also benefit from occupational therapy.  I would appreciate if physical therapy would like an assessment to determine if he would benefit from an AFO on his right foot.

## 2021-09-18 DIAGNOSIS — G63 Polyneuropathy in diseases classified elsewhere: Secondary | ICD-10-CM | POA: Diagnosis not present

## 2021-09-18 DIAGNOSIS — I951 Orthostatic hypotension: Secondary | ICD-10-CM | POA: Diagnosis not present

## 2021-09-18 DIAGNOSIS — R443 Hallucinations, unspecified: Secondary | ICD-10-CM | POA: Diagnosis not present

## 2021-09-18 DIAGNOSIS — E538 Deficiency of other specified B group vitamins: Secondary | ICD-10-CM | POA: Diagnosis not present

## 2021-09-18 DIAGNOSIS — R296 Repeated falls: Secondary | ICD-10-CM | POA: Diagnosis not present

## 2021-09-18 DIAGNOSIS — Z9181 History of falling: Secondary | ICD-10-CM | POA: Diagnosis not present

## 2021-09-18 DIAGNOSIS — E785 Hyperlipidemia, unspecified: Secondary | ICD-10-CM | POA: Diagnosis not present

## 2021-09-18 DIAGNOSIS — M5090 Cervical disc disorder, unspecified, unspecified cervical region: Secondary | ICD-10-CM | POA: Diagnosis not present

## 2021-09-18 DIAGNOSIS — M21372 Foot drop, left foot: Secondary | ICD-10-CM | POA: Diagnosis not present

## 2021-09-18 DIAGNOSIS — K2970 Gastritis, unspecified, without bleeding: Secondary | ICD-10-CM | POA: Diagnosis not present

## 2021-09-18 DIAGNOSIS — W19XXXD Unspecified fall, subsequent encounter: Secondary | ICD-10-CM | POA: Diagnosis not present

## 2021-09-18 DIAGNOSIS — G20 Parkinson's disease: Secondary | ICD-10-CM | POA: Diagnosis not present

## 2021-09-19 DIAGNOSIS — I951 Orthostatic hypotension: Secondary | ICD-10-CM | POA: Diagnosis not present

## 2021-09-19 DIAGNOSIS — R443 Hallucinations, unspecified: Secondary | ICD-10-CM | POA: Diagnosis not present

## 2021-09-19 DIAGNOSIS — G20 Parkinson's disease: Secondary | ICD-10-CM | POA: Diagnosis not present

## 2021-09-19 DIAGNOSIS — M5090 Cervical disc disorder, unspecified, unspecified cervical region: Secondary | ICD-10-CM | POA: Diagnosis not present

## 2021-09-19 DIAGNOSIS — R296 Repeated falls: Secondary | ICD-10-CM | POA: Diagnosis not present

## 2021-09-19 DIAGNOSIS — M21372 Foot drop, left foot: Secondary | ICD-10-CM | POA: Diagnosis not present

## 2021-09-21 DIAGNOSIS — M5090 Cervical disc disorder, unspecified, unspecified cervical region: Secondary | ICD-10-CM | POA: Diagnosis not present

## 2021-09-21 DIAGNOSIS — I951 Orthostatic hypotension: Secondary | ICD-10-CM | POA: Diagnosis not present

## 2021-09-21 DIAGNOSIS — R296 Repeated falls: Secondary | ICD-10-CM | POA: Diagnosis not present

## 2021-09-21 DIAGNOSIS — M21372 Foot drop, left foot: Secondary | ICD-10-CM | POA: Diagnosis not present

## 2021-09-21 DIAGNOSIS — R443 Hallucinations, unspecified: Secondary | ICD-10-CM | POA: Diagnosis not present

## 2021-09-21 DIAGNOSIS — G20 Parkinson's disease: Secondary | ICD-10-CM | POA: Diagnosis not present

## 2021-09-23 DIAGNOSIS — R443 Hallucinations, unspecified: Secondary | ICD-10-CM | POA: Diagnosis not present

## 2021-09-23 DIAGNOSIS — M21372 Foot drop, left foot: Secondary | ICD-10-CM | POA: Diagnosis not present

## 2021-09-23 DIAGNOSIS — I951 Orthostatic hypotension: Secondary | ICD-10-CM | POA: Diagnosis not present

## 2021-09-23 DIAGNOSIS — R296 Repeated falls: Secondary | ICD-10-CM | POA: Diagnosis not present

## 2021-09-23 DIAGNOSIS — M5090 Cervical disc disorder, unspecified, unspecified cervical region: Secondary | ICD-10-CM | POA: Diagnosis not present

## 2021-09-23 DIAGNOSIS — G20 Parkinson's disease: Secondary | ICD-10-CM | POA: Diagnosis not present

## 2021-09-24 DIAGNOSIS — R296 Repeated falls: Secondary | ICD-10-CM | POA: Diagnosis not present

## 2021-09-24 DIAGNOSIS — M21372 Foot drop, left foot: Secondary | ICD-10-CM | POA: Diagnosis not present

## 2021-09-24 DIAGNOSIS — R443 Hallucinations, unspecified: Secondary | ICD-10-CM | POA: Diagnosis not present

## 2021-09-24 DIAGNOSIS — G20 Parkinson's disease: Secondary | ICD-10-CM | POA: Diagnosis not present

## 2021-09-24 DIAGNOSIS — I951 Orthostatic hypotension: Secondary | ICD-10-CM | POA: Diagnosis not present

## 2021-09-24 DIAGNOSIS — M5090 Cervical disc disorder, unspecified, unspecified cervical region: Secondary | ICD-10-CM | POA: Diagnosis not present

## 2021-09-25 DIAGNOSIS — M5090 Cervical disc disorder, unspecified, unspecified cervical region: Secondary | ICD-10-CM | POA: Diagnosis not present

## 2021-09-25 DIAGNOSIS — M21372 Foot drop, left foot: Secondary | ICD-10-CM | POA: Diagnosis not present

## 2021-09-25 DIAGNOSIS — R296 Repeated falls: Secondary | ICD-10-CM | POA: Diagnosis not present

## 2021-09-25 DIAGNOSIS — I951 Orthostatic hypotension: Secondary | ICD-10-CM | POA: Diagnosis not present

## 2021-09-25 DIAGNOSIS — R443 Hallucinations, unspecified: Secondary | ICD-10-CM | POA: Diagnosis not present

## 2021-09-25 DIAGNOSIS — G20 Parkinson's disease: Secondary | ICD-10-CM | POA: Diagnosis not present

## 2021-09-27 DIAGNOSIS — G20 Parkinson's disease: Secondary | ICD-10-CM | POA: Diagnosis not present

## 2021-09-27 DIAGNOSIS — M5090 Cervical disc disorder, unspecified, unspecified cervical region: Secondary | ICD-10-CM | POA: Diagnosis not present

## 2021-09-27 DIAGNOSIS — M21372 Foot drop, left foot: Secondary | ICD-10-CM | POA: Diagnosis not present

## 2021-09-27 DIAGNOSIS — R443 Hallucinations, unspecified: Secondary | ICD-10-CM | POA: Diagnosis not present

## 2021-09-27 DIAGNOSIS — I951 Orthostatic hypotension: Secondary | ICD-10-CM | POA: Diagnosis not present

## 2021-09-27 DIAGNOSIS — R296 Repeated falls: Secondary | ICD-10-CM | POA: Diagnosis not present

## 2021-09-30 ENCOUNTER — Telehealth: Payer: Self-pay

## 2021-09-30 DIAGNOSIS — R443 Hallucinations, unspecified: Secondary | ICD-10-CM | POA: Diagnosis not present

## 2021-09-30 DIAGNOSIS — M21372 Foot drop, left foot: Secondary | ICD-10-CM | POA: Diagnosis not present

## 2021-09-30 DIAGNOSIS — R296 Repeated falls: Secondary | ICD-10-CM | POA: Diagnosis not present

## 2021-09-30 DIAGNOSIS — G20 Parkinson's disease: Secondary | ICD-10-CM | POA: Diagnosis not present

## 2021-09-30 DIAGNOSIS — I951 Orthostatic hypotension: Secondary | ICD-10-CM | POA: Diagnosis not present

## 2021-09-30 DIAGNOSIS — M5090 Cervical disc disorder, unspecified, unspecified cervical region: Secondary | ICD-10-CM | POA: Diagnosis not present

## 2021-09-30 NOTE — Telephone Encounter (Signed)
Alex Frazier w/[facility] PT called to report she had visit with pt today and he has reported a fall on 09/28/21. Pt stated he was on the toilet and fell over and hit his head. Pt denies any head injury but does report cervical neck pain 4/10. Per caregiver pt is not able to come into office to be evaluated.   Please advise, thanks!

## 2021-10-01 DIAGNOSIS — R443 Hallucinations, unspecified: Secondary | ICD-10-CM | POA: Diagnosis not present

## 2021-10-01 DIAGNOSIS — I951 Orthostatic hypotension: Secondary | ICD-10-CM | POA: Diagnosis not present

## 2021-10-01 DIAGNOSIS — M21372 Foot drop, left foot: Secondary | ICD-10-CM | POA: Diagnosis not present

## 2021-10-01 DIAGNOSIS — M5090 Cervical disc disorder, unspecified, unspecified cervical region: Secondary | ICD-10-CM | POA: Diagnosis not present

## 2021-10-01 DIAGNOSIS — G20 Parkinson's disease: Secondary | ICD-10-CM | POA: Diagnosis not present

## 2021-10-01 DIAGNOSIS — R296 Repeated falls: Secondary | ICD-10-CM | POA: Diagnosis not present

## 2021-10-01 NOTE — Telephone Encounter (Signed)
Spoke with pt's caregiver and she states pt is not in significant pain and that he already has previous history of cervical neck issues. She reports they have been applying heat and pt is feeling much better. She states they do not need xray at this time. Advised her to contact us if they change their mind or pain returns. Nothing further needed at this time.

## 2021-10-02 DIAGNOSIS — R443 Hallucinations, unspecified: Secondary | ICD-10-CM | POA: Diagnosis not present

## 2021-10-02 DIAGNOSIS — M21372 Foot drop, left foot: Secondary | ICD-10-CM | POA: Diagnosis not present

## 2021-10-02 DIAGNOSIS — I951 Orthostatic hypotension: Secondary | ICD-10-CM | POA: Diagnosis not present

## 2021-10-02 DIAGNOSIS — M5090 Cervical disc disorder, unspecified, unspecified cervical region: Secondary | ICD-10-CM | POA: Diagnosis not present

## 2021-10-02 DIAGNOSIS — R296 Repeated falls: Secondary | ICD-10-CM | POA: Diagnosis not present

## 2021-10-02 DIAGNOSIS — G20 Parkinson's disease: Secondary | ICD-10-CM | POA: Diagnosis not present

## 2021-10-03 DIAGNOSIS — M5090 Cervical disc disorder, unspecified, unspecified cervical region: Secondary | ICD-10-CM | POA: Diagnosis not present

## 2021-10-03 DIAGNOSIS — R443 Hallucinations, unspecified: Secondary | ICD-10-CM | POA: Diagnosis not present

## 2021-10-03 DIAGNOSIS — I951 Orthostatic hypotension: Secondary | ICD-10-CM | POA: Diagnosis not present

## 2021-10-03 DIAGNOSIS — R296 Repeated falls: Secondary | ICD-10-CM | POA: Diagnosis not present

## 2021-10-03 DIAGNOSIS — M21372 Foot drop, left foot: Secondary | ICD-10-CM | POA: Diagnosis not present

## 2021-10-03 DIAGNOSIS — G20 Parkinson's disease: Secondary | ICD-10-CM | POA: Diagnosis not present

## 2021-10-04 DIAGNOSIS — M21372 Foot drop, left foot: Secondary | ICD-10-CM | POA: Diagnosis not present

## 2021-10-04 DIAGNOSIS — G20 Parkinson's disease: Secondary | ICD-10-CM | POA: Diagnosis not present

## 2021-10-04 DIAGNOSIS — E538 Deficiency of other specified B group vitamins: Secondary | ICD-10-CM | POA: Diagnosis not present

## 2021-10-04 DIAGNOSIS — R296 Repeated falls: Secondary | ICD-10-CM | POA: Diagnosis not present

## 2021-10-04 DIAGNOSIS — M5090 Cervical disc disorder, unspecified, unspecified cervical region: Secondary | ICD-10-CM | POA: Diagnosis not present

## 2021-10-04 DIAGNOSIS — R443 Hallucinations, unspecified: Secondary | ICD-10-CM | POA: Diagnosis not present

## 2021-10-04 DIAGNOSIS — I951 Orthostatic hypotension: Secondary | ICD-10-CM | POA: Diagnosis not present

## 2021-10-07 DIAGNOSIS — G20 Parkinson's disease: Secondary | ICD-10-CM | POA: Diagnosis not present

## 2021-10-07 DIAGNOSIS — M5090 Cervical disc disorder, unspecified, unspecified cervical region: Secondary | ICD-10-CM | POA: Diagnosis not present

## 2021-10-07 DIAGNOSIS — R443 Hallucinations, unspecified: Secondary | ICD-10-CM | POA: Diagnosis not present

## 2021-10-07 DIAGNOSIS — M21372 Foot drop, left foot: Secondary | ICD-10-CM | POA: Diagnosis not present

## 2021-10-07 DIAGNOSIS — R296 Repeated falls: Secondary | ICD-10-CM | POA: Diagnosis not present

## 2021-10-07 DIAGNOSIS — I951 Orthostatic hypotension: Secondary | ICD-10-CM | POA: Diagnosis not present

## 2021-10-09 DIAGNOSIS — G20 Parkinson's disease: Secondary | ICD-10-CM | POA: Diagnosis not present

## 2021-10-09 DIAGNOSIS — R296 Repeated falls: Secondary | ICD-10-CM | POA: Diagnosis not present

## 2021-10-09 DIAGNOSIS — M21372 Foot drop, left foot: Secondary | ICD-10-CM | POA: Diagnosis not present

## 2021-10-09 DIAGNOSIS — M5090 Cervical disc disorder, unspecified, unspecified cervical region: Secondary | ICD-10-CM | POA: Diagnosis not present

## 2021-10-09 DIAGNOSIS — I951 Orthostatic hypotension: Secondary | ICD-10-CM | POA: Diagnosis not present

## 2021-10-09 DIAGNOSIS — R443 Hallucinations, unspecified: Secondary | ICD-10-CM | POA: Diagnosis not present

## 2021-10-10 DIAGNOSIS — R443 Hallucinations, unspecified: Secondary | ICD-10-CM | POA: Diagnosis not present

## 2021-10-10 DIAGNOSIS — M21372 Foot drop, left foot: Secondary | ICD-10-CM | POA: Diagnosis not present

## 2021-10-10 DIAGNOSIS — G20 Parkinson's disease: Secondary | ICD-10-CM | POA: Diagnosis not present

## 2021-10-10 DIAGNOSIS — R296 Repeated falls: Secondary | ICD-10-CM | POA: Diagnosis not present

## 2021-10-10 DIAGNOSIS — I951 Orthostatic hypotension: Secondary | ICD-10-CM | POA: Diagnosis not present

## 2021-10-10 DIAGNOSIS — M5090 Cervical disc disorder, unspecified, unspecified cervical region: Secondary | ICD-10-CM | POA: Diagnosis not present

## 2021-10-15 ENCOUNTER — Other Ambulatory Visit: Payer: Self-pay | Admitting: Family Medicine

## 2021-10-15 DIAGNOSIS — G20 Parkinson's disease: Secondary | ICD-10-CM | POA: Diagnosis not present

## 2021-10-15 DIAGNOSIS — R443 Hallucinations, unspecified: Secondary | ICD-10-CM | POA: Diagnosis not present

## 2021-10-15 DIAGNOSIS — M5090 Cervical disc disorder, unspecified, unspecified cervical region: Secondary | ICD-10-CM | POA: Diagnosis not present

## 2021-10-15 DIAGNOSIS — M21372 Foot drop, left foot: Secondary | ICD-10-CM | POA: Diagnosis not present

## 2021-10-15 DIAGNOSIS — R296 Repeated falls: Secondary | ICD-10-CM | POA: Diagnosis not present

## 2021-10-15 DIAGNOSIS — I951 Orthostatic hypotension: Secondary | ICD-10-CM | POA: Diagnosis not present

## 2021-10-18 ENCOUNTER — Telehealth: Payer: Self-pay

## 2021-10-18 DIAGNOSIS — M21372 Foot drop, left foot: Secondary | ICD-10-CM | POA: Diagnosis not present

## 2021-10-18 DIAGNOSIS — E538 Deficiency of other specified B group vitamins: Secondary | ICD-10-CM | POA: Diagnosis not present

## 2021-10-18 DIAGNOSIS — G20 Parkinson's disease: Secondary | ICD-10-CM | POA: Diagnosis not present

## 2021-10-18 DIAGNOSIS — M5090 Cervical disc disorder, unspecified, unspecified cervical region: Secondary | ICD-10-CM | POA: Diagnosis not present

## 2021-10-18 DIAGNOSIS — W19XXXD Unspecified fall, subsequent encounter: Secondary | ICD-10-CM | POA: Diagnosis not present

## 2021-10-18 DIAGNOSIS — E785 Hyperlipidemia, unspecified: Secondary | ICD-10-CM | POA: Diagnosis not present

## 2021-10-18 DIAGNOSIS — I951 Orthostatic hypotension: Secondary | ICD-10-CM | POA: Diagnosis not present

## 2021-10-18 DIAGNOSIS — Z9181 History of falling: Secondary | ICD-10-CM | POA: Diagnosis not present

## 2021-10-18 DIAGNOSIS — R296 Repeated falls: Secondary | ICD-10-CM | POA: Diagnosis not present

## 2021-10-18 DIAGNOSIS — G63 Polyneuropathy in diseases classified elsewhere: Secondary | ICD-10-CM | POA: Diagnosis not present

## 2021-10-18 DIAGNOSIS — R443 Hallucinations, unspecified: Secondary | ICD-10-CM | POA: Diagnosis not present

## 2021-10-18 DIAGNOSIS — K2970 Gastritis, unspecified, without bleeding: Secondary | ICD-10-CM | POA: Diagnosis not present

## 2021-10-18 NOTE — Telephone Encounter (Signed)
Doren Custard w/[facility] called to request verbal approval for PT extension for 2xw/5w to work on balance, transfer, strength and more.   Verbal approval given. Nothing further needed at this time.

## 2021-10-22 DIAGNOSIS — R443 Hallucinations, unspecified: Secondary | ICD-10-CM | POA: Diagnosis not present

## 2021-10-22 DIAGNOSIS — R296 Repeated falls: Secondary | ICD-10-CM | POA: Diagnosis not present

## 2021-10-22 DIAGNOSIS — M21372 Foot drop, left foot: Secondary | ICD-10-CM | POA: Diagnosis not present

## 2021-10-22 DIAGNOSIS — G20 Parkinson's disease: Secondary | ICD-10-CM | POA: Diagnosis not present

## 2021-10-22 DIAGNOSIS — I951 Orthostatic hypotension: Secondary | ICD-10-CM | POA: Diagnosis not present

## 2021-10-22 DIAGNOSIS — M5090 Cervical disc disorder, unspecified, unspecified cervical region: Secondary | ICD-10-CM | POA: Diagnosis not present

## 2021-10-23 DIAGNOSIS — M21372 Foot drop, left foot: Secondary | ICD-10-CM | POA: Diagnosis not present

## 2021-10-23 DIAGNOSIS — M5090 Cervical disc disorder, unspecified, unspecified cervical region: Secondary | ICD-10-CM | POA: Diagnosis not present

## 2021-10-23 DIAGNOSIS — R296 Repeated falls: Secondary | ICD-10-CM | POA: Diagnosis not present

## 2021-10-23 DIAGNOSIS — R443 Hallucinations, unspecified: Secondary | ICD-10-CM | POA: Diagnosis not present

## 2021-10-23 DIAGNOSIS — G20 Parkinson's disease: Secondary | ICD-10-CM | POA: Diagnosis not present

## 2021-10-23 DIAGNOSIS — I951 Orthostatic hypotension: Secondary | ICD-10-CM | POA: Diagnosis not present

## 2021-10-24 DIAGNOSIS — M21372 Foot drop, left foot: Secondary | ICD-10-CM | POA: Diagnosis not present

## 2021-10-24 DIAGNOSIS — M5090 Cervical disc disorder, unspecified, unspecified cervical region: Secondary | ICD-10-CM | POA: Diagnosis not present

## 2021-10-24 DIAGNOSIS — I951 Orthostatic hypotension: Secondary | ICD-10-CM | POA: Diagnosis not present

## 2021-10-24 DIAGNOSIS — R443 Hallucinations, unspecified: Secondary | ICD-10-CM | POA: Diagnosis not present

## 2021-10-24 DIAGNOSIS — R296 Repeated falls: Secondary | ICD-10-CM | POA: Diagnosis not present

## 2021-10-24 DIAGNOSIS — G20 Parkinson's disease: Secondary | ICD-10-CM | POA: Diagnosis not present

## 2021-10-25 DIAGNOSIS — M21372 Foot drop, left foot: Secondary | ICD-10-CM | POA: Diagnosis not present

## 2021-10-25 DIAGNOSIS — G20 Parkinson's disease: Secondary | ICD-10-CM | POA: Diagnosis not present

## 2021-10-25 DIAGNOSIS — R443 Hallucinations, unspecified: Secondary | ICD-10-CM | POA: Diagnosis not present

## 2021-10-25 DIAGNOSIS — R296 Repeated falls: Secondary | ICD-10-CM | POA: Diagnosis not present

## 2021-10-25 DIAGNOSIS — I951 Orthostatic hypotension: Secondary | ICD-10-CM | POA: Diagnosis not present

## 2021-10-25 DIAGNOSIS — M5090 Cervical disc disorder, unspecified, unspecified cervical region: Secondary | ICD-10-CM | POA: Diagnosis not present

## 2021-10-28 DIAGNOSIS — R443 Hallucinations, unspecified: Secondary | ICD-10-CM | POA: Diagnosis not present

## 2021-10-28 DIAGNOSIS — M21372 Foot drop, left foot: Secondary | ICD-10-CM | POA: Diagnosis not present

## 2021-10-28 DIAGNOSIS — I951 Orthostatic hypotension: Secondary | ICD-10-CM | POA: Diagnosis not present

## 2021-10-28 DIAGNOSIS — R296 Repeated falls: Secondary | ICD-10-CM | POA: Diagnosis not present

## 2021-10-28 DIAGNOSIS — G20 Parkinson's disease: Secondary | ICD-10-CM | POA: Diagnosis not present

## 2021-10-28 DIAGNOSIS — M5090 Cervical disc disorder, unspecified, unspecified cervical region: Secondary | ICD-10-CM | POA: Diagnosis not present

## 2021-11-01 DIAGNOSIS — G20 Parkinson's disease: Secondary | ICD-10-CM | POA: Diagnosis not present

## 2021-11-01 DIAGNOSIS — R296 Repeated falls: Secondary | ICD-10-CM | POA: Diagnosis not present

## 2021-11-01 DIAGNOSIS — M21372 Foot drop, left foot: Secondary | ICD-10-CM | POA: Diagnosis not present

## 2021-11-01 DIAGNOSIS — M5090 Cervical disc disorder, unspecified, unspecified cervical region: Secondary | ICD-10-CM | POA: Diagnosis not present

## 2021-11-01 DIAGNOSIS — I951 Orthostatic hypotension: Secondary | ICD-10-CM | POA: Diagnosis not present

## 2021-11-01 DIAGNOSIS — R443 Hallucinations, unspecified: Secondary | ICD-10-CM | POA: Diagnosis not present

## 2021-11-05 DIAGNOSIS — R443 Hallucinations, unspecified: Secondary | ICD-10-CM | POA: Diagnosis not present

## 2021-11-05 DIAGNOSIS — M5090 Cervical disc disorder, unspecified, unspecified cervical region: Secondary | ICD-10-CM | POA: Diagnosis not present

## 2021-11-05 DIAGNOSIS — R296 Repeated falls: Secondary | ICD-10-CM | POA: Diagnosis not present

## 2021-11-05 DIAGNOSIS — G20 Parkinson's disease: Secondary | ICD-10-CM | POA: Diagnosis not present

## 2021-11-05 DIAGNOSIS — M21372 Foot drop, left foot: Secondary | ICD-10-CM | POA: Diagnosis not present

## 2021-11-05 DIAGNOSIS — I951 Orthostatic hypotension: Secondary | ICD-10-CM | POA: Diagnosis not present

## 2021-11-14 DIAGNOSIS — I951 Orthostatic hypotension: Secondary | ICD-10-CM | POA: Diagnosis not present

## 2021-11-14 DIAGNOSIS — M21372 Foot drop, left foot: Secondary | ICD-10-CM | POA: Diagnosis not present

## 2021-11-14 DIAGNOSIS — M5090 Cervical disc disorder, unspecified, unspecified cervical region: Secondary | ICD-10-CM | POA: Diagnosis not present

## 2021-11-14 DIAGNOSIS — G20 Parkinson's disease: Secondary | ICD-10-CM | POA: Diagnosis not present

## 2021-11-14 DIAGNOSIS — R296 Repeated falls: Secondary | ICD-10-CM | POA: Diagnosis not present

## 2021-11-14 DIAGNOSIS — R443 Hallucinations, unspecified: Secondary | ICD-10-CM | POA: Diagnosis not present

## 2021-11-28 ENCOUNTER — Ambulatory Visit (INDEPENDENT_AMBULATORY_CARE_PROVIDER_SITE_OTHER): Payer: Medicare Other | Admitting: Family Medicine

## 2021-11-28 ENCOUNTER — Other Ambulatory Visit: Payer: Self-pay

## 2021-11-28 VITALS — BP 122/58 | HR 65 | Temp 97.6°F | Ht 66.0 in

## 2021-11-28 DIAGNOSIS — G629 Polyneuropathy, unspecified: Secondary | ICD-10-CM | POA: Diagnosis not present

## 2021-11-28 DIAGNOSIS — I951 Orthostatic hypotension: Secondary | ICD-10-CM

## 2021-11-28 DIAGNOSIS — G20A1 Parkinson's disease without dyskinesia, without mention of fluctuations: Secondary | ICD-10-CM

## 2021-11-28 DIAGNOSIS — G20 Parkinson's disease: Secondary | ICD-10-CM | POA: Diagnosis not present

## 2021-11-28 DIAGNOSIS — R296 Repeated falls: Secondary | ICD-10-CM | POA: Diagnosis not present

## 2021-11-28 NOTE — Progress Notes (Signed)
? ?Subjective:  ? ? Patient ID: Alex Frazier, male    DOB: 06/11/1946, 76 y.o.   MRN: [id number] ? ?HPI ?Patient is here today for checkup.  He is accompanied by his wife.  He is sitting in a wheelchair.  He falls frequently.  Due to his Parkinson's disease, his balance is very poor.  He also has drops in blood pressure every time he stands.  He is taking fludrocortisone every morning to try to keep his blood pressure elevated.  Use midodrine twice a day as well.  He denies any syncope but does report several episodes of near syncope.  He has not yet had his most recent COVID shot.  He denies any chest pain shortness of breath or dyspnea on exertion.  He denies any abdominal pain or melena or hematochezia or blood in his stool.  He denies any constipation.  He does have some hallucinations at night however these are not problematic for him.  His wife denies any aggressive or violent behavior.  The hallucinations do not bother him or make him anxious.  He does have urinary incontinence at night.  He does have some memory loss but his wife states this is stable. ? ?Past Medical History:  ?Diagnosis Date  ? B12 deficiency   ? Borderline  ? Bulging discs   ? Degenerative disc disease, cervical   ? Gastritis   ? Hyperlipidemia   ? Neuropathy   ? Parkinson's disease ([place])   ? Parkinsonian syndrome ([place])   ? Peripheral neuropathy   ? TIA (transient ischemic attack)   ? TIA symptoms from hypotension  ? ?Past Surgical History:  ?Procedure Laterality Date  ? APPENDECTOMY    ? BACK SURGERY    ? ?Current Outpatient Medications on File Prior to Visit  ?Medication Sig Dispense Refill  ? acetaminophen (TYLENOL) 500 MG tablet Take 2 tablets (1,000 mg total) by mouth 3 (three) times daily. 30 tablet 0  ? carbidopa-levodopa (SINEMET IR) 25-100 MG tablet Take 2 tablets by mouth See admin instructions. Take 2 tablets by mouth three times daily- 7 AM, 1 PM, and 7 PM    ? cholecalciferol (VITAMIN D3) 25 MCG (1000 UNIT) tablet Take 2,000  Units by mouth in the morning.     ? Cyanocobalamin (VITAMIN B 12 PO) Take 1 tablet by mouth in the morning.     ? donepezil (ARICEPT) 5 MG tablet Take 5 mg by mouth at bedtime.     ? DULoxetine (CYMBALTA) 60 MG capsule TAKE 1 CAPSULE(60 MG) BY MOUTH DAILY 90 capsule 3  ? fludrocortisone (FLORINEF) 0.1 MG tablet TAKE TWO TABLETS BY MOUTH DAILY 180 tablet 3  ? hydrocortisone 2.5 % cream Apply topically 2 (two) times daily. 30 g 0  ? MELATONIN ER PO Take 15 mg by mouth.    ? Menthol, Topical Analgesic, (BIOFREEZE) 4 % GEL Apply 1 application topically See admin instructions. Apply topically to painful sites as directed by MD    ? midodrine (PROAMATINE) 10 MG tablet Take 1 tablet (10 mg total) by mouth 3 (three) times daily. 270 tablet 3  ? polyethylene glycol powder (GLYCOLAX/MIRALAX) 17 GM/SCOOP powder Take 17 g by mouth See admin instructions. Mix 17 grams of powder into 4-8 ounces of prune juice and drink once a day    ? pravastatin (PRAVACHOL) 20 MG tablet TAKE 1 TABLET BY MOUTH DAILY AT 6PM. GENERIC EQUIVALENT FOR PRAVACHOL 90 tablet 2  ? senna-docusate (SENOKOT-S) 8.6-50 MG tablet Take 2 tablets  by mouth at bedtime as needed for mild constipation. 60 tablet 2  ? ?No current facility-administered medications on file prior to visit.  ? ?Allergies  ?Allergen Reactions  ? Tape Other (See Comments)  ?  SKIN IS VERY THIN- TEARS VERY EASILY!!  ? Hydrocodone-Acetaminophen Other (See Comments)  ?  Hallucinations and confusion  ? Indomethacin Other (See Comments)  ?  Unknown reaction  ? Lyrica [Pregabalin] Other (See Comments)  ?  Vivid hallucinations  ? Quetiapine Other (See Comments)  ?  HYPOtension!!  ? ?Social History  ? ?Socioeconomic History  ? Marital status: Married  ?  Spouse name: Not on file  ? Number of children: 3  ? Years of education: Not on file  ? Highest education level: High school graduate  ?Occupational History  ? Not on file  ?Tobacco Use  ? Smoking status: Never  ? Smokeless tobacco: Never  ?Vaping  Use  ? Vaping Use: Never used  ?Substance and Sexual Activity  ? Alcohol use: No  ? Drug use: No  ? Sexual activity: Not on file  ?Other Topics Concern  ? Not on file  ?Social History Narrative  ? Lives at home with his wife  ? Right handed  ? Caffeine: none  ? ?Social Determinants of Health  ? ?Financial Resource Strain: Low Risk   ? Difficulty of Paying Living Expenses: Not hard at all  ?Food Insecurity: No Food Insecurity  ? Worried About Charity fundraiser in the Last Year: Never true  ? Ran Out of Food in the Last Year: Never true  ?Transportation Needs: No Transportation Needs  ? Lack of Transportation (Medical): No  ? Lack of Transportation (Non-Medical): No  ?Physical Activity: Inactive  ? Days of Exercise per Week: 0 days  ? Minutes of Exercise per Session: 0 min  ?Stress: No Stress Concern Present  ? Feeling of Stress : Not at all  ?Social Connections: Moderately Integrated  ? Frequency of Communication with Friends and Family: More than three times a week  ? Frequency of Social Gatherings with Friends and Family: More than three times a week  ? Attends Religious Services: 1 to 4 times per year  ? Active Member of Clubs or Organizations: No  ? Attends Archivist Meetings: Never  ? Marital Status: Married  ?Intimate Partner Violence: Not At Risk  ? Fear of Current or Ex-Partner: No  ? Emotionally Abused: No  ? Physically Abused: No  ? Sexually Abused: No  ? ? ? ?Review of Systems  ?All other systems reviewed and are negative. ? ?   ?Objective:  ? Physical Exam ?Constitutional:   ?   Appearance: Normal appearance. He is well-groomed and normal weight. He is not ill-appearing or toxic-appearing.  ?Cardiovascular:  ?   Rate and Rhythm: Normal rate and regular rhythm.  ?   Heart sounds: Normal heart sounds.  ?Pulmonary:  ?   Effort: Pulmonary effort is normal.  ?   Breath sounds: Normal breath sounds.  ?Neurological:  ?   Mental Status: He is alert and oriented to person, place, and time. Mental  status is at baseline.  ?   Motor: Weakness present.  ?   Coordination: Coordination abnormal.  ?   Gait: Gait abnormal.  ?Psychiatric:     ?   Mood and Affect: Mood normal.  ? ? ? ?   ?Assessment & Plan:  ?Parkinson's disease ([place]) - Plan: CBC with Differential/Platelet, COMPLETE METABOLIC PANEL WITH GFR, Vitamin  B12 ? ?Orthostatic hypotension - Plan: CBC with Differential/Platelet, COMPLETE METABOLIC PANEL WITH GFR, Vitamin B12 ? ?Falls frequently - Plan: CBC with Differential/Platelet, COMPLETE METABOLIC PANEL WITH GFR, Vitamin B12 ? ?Neuropathy - Plan: CBC with Differential/Platelet, COMPLETE METABOLIC PANEL WITH GFR, Vitamin B12 ?Check baseline lab work including CBC CMP and vitamin B12.  Blood pressure today is acceptable.  Continue fludrocortisone and midodrine as prescribed.  Patient is due for the COVID booster.  I recommended this.  Otherwise from a symptomatic standpoint his review of systems is negative.  He continues to have frequent falls and problems with his balance.  He continues to work with physical therapy at home and wear his AFO on his left leg. ?

## 2021-11-29 LAB — CBC WITH DIFFERENTIAL/PLATELET
Absolute Monocytes: 528 cells/uL (ref 200–950)
Basophils Absolute: 61 cells/uL (ref 0–200)
Basophils Relative: 1.1 %
Eosinophils Absolute: 77 cells/uL (ref 15–500)
Eosinophils Relative: 1.4 %
HCT: 43.3 % (ref 38.5–50.0)
Hemoglobin: 14.5 g/dL (ref 13.2–17.1)
Lymphs Abs: 1078 cells/uL (ref 850–3900)
MCH: 29.4 pg (ref 27.0–33.0)
MCHC: 33.5 g/dL (ref 32.0–36.0)
MCV: 87.8 fL (ref 80.0–100.0)
MPV: 9.8 fL (ref 7.5–12.5)
Monocytes Relative: 9.6 %
Neutro Abs: 3757 cells/uL (ref 1500–7800)
Neutrophils Relative %: 68.3 %
Platelets: 265 10*3/uL (ref 140–400)
RBC: 4.93 10*6/uL (ref 4.20–5.80)
RDW: 13.3 % (ref 11.0–15.0)
Total Lymphocyte: 19.6 %
WBC: 5.5 10*3/uL (ref 3.8–10.8)

## 2021-11-29 LAB — COMPLETE METABOLIC PANEL WITH GFR
AG Ratio: 1.8 (calc) (ref 1.0–2.5)
ALT: 11 U/L (ref 9–46)
AST: 18 U/L (ref 10–35)
Albumin: 4.1 g/dL (ref 3.6–5.1)
Alkaline phosphatase (APISO): 112 U/L (ref 35–144)
BUN: 15 mg/dL (ref 7–25)
CO2: 29 mmol/L (ref 20–32)
Calcium: 9.6 mg/dL (ref 8.6–10.3)
Chloride: 104 mmol/L (ref 98–110)
Creat: 0.88 mg/dL (ref 0.70–1.28)
Globulin: 2.3 g/dL (calc) (ref 1.9–3.7)
Glucose, Bld: 119 mg/dL — ABNORMAL HIGH (ref 65–99)
Potassium: 4.5 mmol/L (ref 3.5–5.3)
Sodium: 141 mmol/L (ref 135–146)
Total Bilirubin: 0.6 mg/dL (ref 0.2–1.2)
Total Protein: 6.4 g/dL (ref 6.1–8.1)
eGFR: 90 mL/min/{1.73_m2} (ref >=60)

## 2021-11-29 LAB — VITAMIN B12: Vitamin B-12: 519 pg/mL (ref 200–1100)

## 2022-01-03 DIAGNOSIS — H43813 Vitreous degeneration, bilateral: Secondary | ICD-10-CM | POA: Diagnosis not present

## 2022-01-03 DIAGNOSIS — H524 Presbyopia: Secondary | ICD-10-CM | POA: Diagnosis not present

## 2022-01-03 DIAGNOSIS — H26493 Other secondary cataract, bilateral: Secondary | ICD-10-CM | POA: Diagnosis not present

## 2022-01-03 DIAGNOSIS — H04123 Dry eye syndrome of bilateral lacrimal glands: Secondary | ICD-10-CM | POA: Diagnosis not present

## 2022-01-06 ENCOUNTER — Telehealth: Payer: Self-pay | Admitting: *Deleted

## 2022-01-06 NOTE — Chronic Care Management (AMB) (Signed)
?  Care Management  ? ?Note ? ?01/06/2022 ?Name: Alex Frazier MRN: [id number] DOB: [birth year] ? ?Alex Frazier is a 76 y.o. year old male who is a primary care patient of Dennard Schaumann, Cammie Mcgee, MD. I reached out to Shirline Frees by phone today offer care coordination services.  ? ?Mr. Lippman was given information about care management services today including:  ?Care management services include personalized support from designated clinical staff supervised by his physician, including individualized plan of care and coordination with other care providers ?24/7 contact phone numbers for assistance for urgent and routine care needs. ?The patient may stop care management services at any time by phone call to the office staff. ? ?Patient agreed to services and verbal consent obtained.  ? ?Follow up plan: ?Telephone appointment with care management team member scheduled for:01/10/22 ? ?Laverda Sorenson  ?Care Guide, Embedded Care Coordination ?[facility]  ?Direct Dial: [phone] ? ?

## 2022-01-10 ENCOUNTER — Ambulatory Visit: Payer: Medicare Other | Admitting: *Deleted

## 2022-01-10 NOTE — Chronic Care Management (AMB) (Signed)
? Care Management ?  ? RN Visit Note ? ?01/10/2022 ?Name: RANULFO KALL MRN: [id number] DOB: 10-20-1945 ? ?Subjective: ?GARNETT NUNZIATA is a 76 y.o. year old male who is a primary care patient of Pickard, Cammie Mcgee, MD. The care management team was consulted for assistance with disease management and care coordination needs.   ? ?Engaged with patient by telephone for initial visit in response to provider referral for case management and/or care coordination services.  ? ?Consent to Services:  ? Mr. Sinkfield was given information about Care Management services today including:  ?Care Management services includes personalized support from designated clinical staff supervised by his physician, including individualized plan of care and coordination with other care providers ?24/7 contact phone numbers for assistance for urgent and routine care needs. ?The patient may stop case management services at any time by phone call to the office staff. ? ?Patient agreed to services and consent obtained.  ? ?Assessment: Review of patient past medical history, allergies, medications, health status, including review of consultants reports, laboratory and other test data, was performed as part of comprehensive evaluation and provision of chronic care management services.  ? ?SDOH (Social Determinants of Health) assessments and interventions performed:  ?SDOH Interventions   ? ?Flowsheet Row Most Recent Value  ?SDOH Interventions   ?Food Insecurity Interventions Intervention Not Indicated  ?Housing Interventions Intervention Not Indicated  ?Transportation Interventions Intervention Not Indicated  ? ?  ?  ? ?Care Plan ? ?Allergies  ?Allergen Reactions  ? Tape Other (See Comments)  ?  SKIN IS VERY THIN- TEARS VERY EASILY!!  ? Hydrocodone-Acetaminophen Other (See Comments)  ?  Hallucinations and confusion  ? Indomethacin Other (See Comments)  ?  Unknown reaction  ? Lyrica [Pregabalin] Other (See Comments)  ?  Vivid hallucinations  ? Quetiapine  Other (See Comments)  ?  HYPOtension!!  ? ? ?Outpatient Encounter Medications as of 01/10/2022  ?Medication Sig Note  ? acetaminophen (TYLENOL) 500 MG tablet Take 2 tablets (1,000 mg total) by mouth 3 (three) times daily.   ? carbidopa-levodopa (SINEMET IR) 25-100 MG tablet Take 2 tablets by mouth See admin instructions. Take 2 tablets by mouth three times daily- 7 AM, 1 PM, and 7 PM 03/08/2021: Takes 2 tablets twice per day   ? cholecalciferol (VITAMIN D3) 25 MCG (1000 UNIT) tablet Take 2,000 Units by mouth in the morning.    ? Cyanocobalamin (VITAMIN B 12 PO) Take 1 tablet by mouth in the morning.    ? donepezil (ARICEPT) 5 MG tablet Take 5 mg by mouth at bedtime.    ? DULoxetine (CYMBALTA) 60 MG capsule TAKE 1 CAPSULE(60 MG) BY MOUTH DAILY   ? fludrocortisone (FLORINEF) 0.1 MG tablet TAKE TWO TABLETS BY MOUTH DAILY   ? MELATONIN ER PO Take 15 mg by mouth.   ? midodrine (PROAMATINE) 10 MG tablet Take 1 tablet (10 mg total) by mouth 3 (three) times daily.   ? polyethylene glycol powder (GLYCOLAX/MIRALAX) 17 GM/SCOOP powder Take 17 g by mouth See admin instructions. Mix 17 grams of powder into 4-8 ounces of prune juice and drink once a day   ? pravastatin (PRAVACHOL) 20 MG tablet TAKE 1 TABLET BY MOUTH DAILY AT 6PM. GENERIC EQUIVALENT FOR PRAVACHOL   ? donepezil (ARICEPT) 5 MG tablet Take by mouth. (Patient not taking: Reported on 01/10/2022)   ? hydrocortisone 2.5 % cream Apply topically 2 (two) times daily. (Patient not taking: Reported on 11/28/2021)   ? Menthol, Topical Analgesic, (BIOFREEZE)  4 % GEL Apply 1 application topically See admin instructions. Apply topically to painful sites as directed by MD (Patient not taking: Reported on 11/28/2021)   ? senna-docusate (SENOKOT-S) 8.6-50 MG tablet Take 2 tablets by mouth at bedtime as needed for mild constipation. (Patient not taking: Reported on 01/10/2022)   ? ?No facility-administered encounter medications on file as of 01/10/2022.  ? ? ?Patient Active Problem List  ?  Diagnosis Date Noted  ? Pulmonary nodule   ? Lumbar compression fracture ([place]) 10/26/2019  ? DDD (degenerative disc disease), cervical 04/07/2016  ? Neuropathy   ? Bulging discs   ? Gastritis   ? Parkinson's disease ([place])   ? Hyperlipidemia   ? ? ?Conditions to be addressed/monitored: HLD and Parkinson's ? ?Care Plan : RN Care Manager Plan of Care  ?Updates made by Kassie Mends, RN since 01/10/2022 12:00 AM  ?  ? ?Problem: No plan of care established for management of chronic disease state  (Parkinsons, HLD)   ?Priority: High  ?  ? ?Long-Range Goal: Development of plan of care for chronic disease management  (Parkinsons, HLD)   ?Start Date: 01/10/2022  ?Expected End Date: 07/09/2022  ?Priority: High  ?Note:   ?Current Barriers:  ?Knowledge Deficits related to plan of care for management of HLD and Parkinson's and Falls  ?Chronic Disease Management support and education needs related to HLD and Parkinson's and Falls ?Falls ?Spoke with spouse Robbin Escher who is primary caregiver for patient, Ms. Mussell states patient requires assistance with ADL, IADL's, patient is able to feed himself, put on his shirt, can help some with bathing, ambulates with U-step walker, has wheelchair and ramp, shower seat. ?Patient has had falls in the past and not as many falls recently, physical therapy recently discharged patient.  Patient does not have any issues remembering but does have paranoia and hallucinations at times related to Parkinsons.   ? ?RNCM Clinical Goal(s):  ?Patient will verbalize understanding of plan for management of HLD and Parkinson's and Falls as evidenced by caregiver report, review of EHR and  through collaboration with RN Care manager, provider, and care team.  ? ?Interventions: ?1:1 collaboration with primary care provider regarding development and update of comprehensive plan of care as evidenced by provider attestation and co-signature ?Inter-disciplinary care team collaboration (see longitudinal plan of  care) ?Evaluation of current treatment plan related to  self management and patient's adherence to plan as established by provider ? ?Falls Interventions:  (Status:  New goal. and Goal on track:  Yes.) Long Term Goal ?Provided written and verbal education re: potential causes of falls and Fall prevention strategies ?Advised patient of importance of notifying provider of falls ?Assessed social determinant of health barriers ? ? ?Parkinson's  (Status:  New goal. and Goal on track:  Yes.)  Long Term Goal ?Evaluation of current treatment plan related to  Parkinson's , ADL IADL limitations, Inability to perform ADL's independently, and Inability to perform IADL's independently self-management and patient's adherence to plan as established by provider. ?Discussed plans with patient for ongoing care management follow up and provided patient with direct contact information for care management team ?Evaluation of current treatment plan related to Parkinson's and patient's adherence to plan as established by provider ?Provided education to patient re: Parkinson's management ?Discussed plans with patient for ongoing care management follow up and provided patient with direct contact information for care management team  ? ?Hyperlipidemia:  (Status: New goal. Goal on Track (progressing): YES.) Long Term Goal  ?  Lab Results  ?Component Value Date  ? CHOL 124 04/25/2019  ? HDL 41 04/25/2019  ? [place] 67 04/25/2019  ? TRIG 76 04/25/2019  ? CHOLHDL 3.0 04/25/2019  ?  ?Medication review performed; medication list updated in electronic medical record.  ?Provider established cholesterol goals reviewed; ?Counseled on importance of regular laboratory monitoring as prescribed; ?Reviewed role and benefits of statin for ASCVD risk reduction; ?Reviewed importance of limiting foods high in cholesterol; ?Assessed social determinant of health barriers;  ?Education provided via My Chart- heart healthy diet ? ?Patient Goals/Self-Care  Activities: ?Take medications as prescribed   ?Attend all scheduled provider appointments ?Call pharmacy for medication refills 3-7 days in advance of running out of medications ?Call provider office for new concerns or qu

## 2022-01-10 NOTE — Patient Instructions (Addendum)
Visit Information ? ?Thank you for taking time to visit with me today. Please don't hesitate to contact me if I can be of assistance to you before our next scheduled telephone appointment. ? ?Following are the goals we discussed today:  ?Take medications as prescribed   ?Attend all scheduled provider appointments ?Call pharmacy for medication refills 3-7 days in advance of running out of medications ?Call provider office for new concerns or questions  ?call for medicine refill 2 or 3 days before it runs out ?take all medications exactly as prescribed ?call doctor with any symptoms you believe are related to your medicine ?call doctor when you experience any new symptoms ?go to all doctor appointments as scheduled ?adhere to prescribed diet: heart healthy ?Follow safety precautions, use your equipment- walker and wheelchair ?fall prevention strategies: change position slowly, use assistive device such as walker or cane (per provider recommendations) when walking, keep walkways clear, have good lighting in room. It is important to contact your provider if you have any falls, maintain muscle strength/tone by exercise per provider recommendations. ?Look over education sent via My Chart- heart healthy diet, Parkinson's, safety precautions ? ?Our next appointment is by telephone on 03/17/22 at 9 am ? ?Please call the care guide team at [phone] if you need to cancel or reschedule your appointment.  ? ?If you are experiencing a Mental Health or [place] or need someone to talk to, please call the Suicide and Crisis Lifeline: 988 ?call the [place] National Suicide Prevention Lifeline: 5131161607 or TTY: 657-628-1790 TTY (780)523-3441) to talk to a trained counselor ?call 1-800-273-TALK (toll free, 24 hour hotline) ?go to [facility] [address] [place] [phone]) ?call 911  ? ?Following is a copy of your full plan of care:  ?Care Plan : [place]  of Care  ?Updates made by Kassie Mends, RN since 01/10/2022 12:00 AM  ?  ? ?Problem: No plan of care established for management of chronic disease state  (Parkinsons, HLD)   ?Priority: High  ?  ? ?Long-Range Goal: Development of plan of care for chronic disease management  (Parkinsons, HLD)   ?Start Date: 01/10/2022  ?Expected End Date: 07/09/2022  ?Priority: High  ?Note:   ?Current Barriers:  ?Knowledge Deficits related to plan of care for management of HLD and Parkinson's and Falls  ?Chronic Disease Management support and education needs related to HLD and Parkinson's and Falls ?Falls ?Spoke with spouse Alex Frazier who is primary caregiver for patient, Alex Frazier states patient requires assistance with ADL, IADL's, patient is able to feed himself, put on his shirt, can help some with bathing, ambulates with U-step walker, has wheelchair and ramp, shower seat. ?Patient has had falls in the past and not as many falls recently, physical therapy recently discharged patient.  Patient does not have any issues remembering but does have paranoia and hallucinations at times related to Parkinsons.   ? ?RNCM Clinical Goal(s):  ?Patient will verbalize understanding of plan for management of HLD and Parkinson's and Falls as evidenced by caregiver report, review of EHR and  through collaboration with RN Care manager, provider, and care team.  ? ?Interventions: ?1:1 collaboration with primary care provider regarding development and update of comprehensive plan of care as evidenced by provider attestation and co-signature ?Inter-disciplinary care team collaboration (see longitudinal plan of care) ?Evaluation of current treatment plan related to  self management and patient's adherence to plan as established by provider ? ?Falls Interventions:  (  Status:  New goal. and Goal on track:  Yes.) Long Term Goal ?Provided written and verbal education re: potential causes of falls and Fall prevention strategies ?Advised patient of importance  of notifying provider of falls ?Assessed social determinant of health barriers ? ? ?Parkinson's  (Status:  New goal. and Goal on track:  Yes.)  Long Term Goal ?Evaluation of current treatment plan related to  Parkinson's , ADL IADL limitations, Inability to perform ADL's independently, and Inability to perform IADL's independently self-management and patient's adherence to plan as established by provider. ?Discussed plans with patient for ongoing care management follow up and provided patient with direct contact information for care management team ?Evaluation of current treatment plan related to Parkinson's and patient's adherence to plan as established by provider ?Provided education to patient re: Parkinson's management ?Discussed plans with patient for ongoing care management follow up and provided patient with direct contact information for care management team  ? ?Hyperlipidemia:  (Status: New goal. Goal on Track (progressing): YES.) Long Term Goal  ?Lab Results  ?Component Value Date  ? CHOL 124 04/25/2019  ? HDL 41 04/25/2019  ? [place] 67 04/25/2019  ? TRIG 76 04/25/2019  ? CHOLHDL 3.0 04/25/2019  ?  ?Medication review performed; medication list updated in electronic medical record.  ?Provider established cholesterol goals reviewed; ?Counseled on importance of regular laboratory monitoring as prescribed; ?Reviewed role and benefits of statin for ASCVD risk reduction; ?Reviewed importance of limiting foods high in cholesterol; ?Assessed social determinant of health barriers;  ?Education provided via My Chart- heart healthy diet ? ?Patient Goals/Self-Care Activities: ?Take medications as prescribed   ?Attend all scheduled provider appointments ?Call pharmacy for medication refills 3-7 days in advance of running out of medications ?Call provider office for new concerns or questions  ?- call for medicine refill 2 or 3 days before it runs out ?- take all medications exactly as prescribed ?- call doctor with any  symptoms you believe are related to your medicine ?- call doctor when you experience any new symptoms ?- go to all doctor appointments as scheduled ?- adhere to prescribed diet: heart healthy ?Follow safety precautions, use your equipment- walker and wheelchair ?fall prevention strategies: change position slowly, use assistive device such as walker or cane (per provider recommendations) when walking, keep walkways clear, have good lighting in room. It is important to contact your provider if you have any falls, maintain muscle strength/tone by exercise per provider recommendations. ?Look over education sent via My Chart- heart healthy diet, Parkinson's, safety precautions ? ? ?  ? ? ?Alex Frazier was given information about Care Management services by the embedded care coordination team including:  ?Care Management services include personalized support from designated clinical staff supervised by his physician, including individualized plan of care and coordination with other care providers ?24/7 contact phone numbers for assistance for urgent and routine care needs. ?The patient may stop CCM services at any time (effective at the end of the month) by phone call to the office staff. ? ?Patient agreed to services and verbal consent obtained.  ? ?Patient verbalizes understanding of instructions and care plan provided today and agrees to view in [place]. Active MyChart status confirmed with patient.   ? ?It is important to avoid accidents which may result in broken bones.  Here are a few ideas on how to make your home safer so you will be less likely to trip or fall. ? ?Use nonskid mats or non slip strips in your shower or  tub, on your bathroom floor and around sinks.  If you know that you have spilled water, wipe it up! ?In the bathroom, it is important to have properly installed grab bars on the walls or on the edge of the tub.  Towel racks are NOT strong enough for you to hold onto or to pull on for support. ?Stairs  and hallways should have enough light.  Add lamps or night lights if you need ore light. ?It is good to have handrails on both sides of the stairs if possible.  Always fix broken handrails right away.

## 2022-01-10 NOTE — Chronic Care Management (AMB) (Deleted)
?Chronic Care Management  ? ?CCM RN Visit Note ? ?01/10/2022 ?Name: Alex Frazier MRN: [id number] DOB: 07/28/1946 ? ?Subjective: ?Alex Frazier is a 76 y.o. year old male who is a primary care patient of Pickard, Cammie Mcgee, MD. The care management team was consulted for assistance with disease management and care coordination needs.   ? ?Engaged with patient by telephone for initial visit in response to provider referral for case management and/or care coordination services.  ? ?Consent to Services:  ?The patient was given the following information about Chronic Care Management services today, agreed to services, and gave verbal consent: 1. CCM service includes personalized support from designated clinical staff supervised by the primary care provider, including individualized plan of care and coordination with other care providers 2. 24/7 contact phone numbers for assistance for urgent and routine care needs. 3. Service will only be billed when office clinical staff spend 20 minutes or more in a month to coordinate care. 4. Only one practitioner may furnish and bill the service in a calendar month. 5.The patient may stop CCM services at any time (effective at the end of the month) by phone call to the office staff. 6. The patient will be responsible for cost sharing (co-pay) of up to 20% of the service fee (after annual deductible is met). Patient agreed to services and consent obtained. ? ?Patient agreed to services and verbal consent obtained.  ? ?Assessment: Review of patient past medical history, allergies, medications, health status, including review of consultants reports, laboratory and other test data, was performed as part of comprehensive evaluation and provision of chronic care management services.  ? ?SDOH (Social Determinants of Health) assessments and interventions performed:  ?SDOH Interventions   ? ?Flowsheet Row Most Recent Value  ?SDOH Interventions   ?Food Insecurity Interventions Intervention Not  Indicated  ?Housing Interventions Intervention Not Indicated  ?Transportation Interventions Intervention Not Indicated  ? ?  ?  ? ?[place] ? ?Allergies  ?Allergen Reactions  ? Tape Other (See Comments)  ?  SKIN IS VERY THIN- TEARS VERY EASILY!!  ? Hydrocodone-Acetaminophen Other (See Comments)  ?  Hallucinations and confusion  ? Indomethacin Other (See Comments)  ?  Unknown reaction  ? Lyrica [Pregabalin] Other (See Comments)  ?  Vivid hallucinations  ? Quetiapine Other (See Comments)  ?  HYPOtension!!  ? ? ?Outpatient Encounter Medications as of 01/10/2022  ?Medication Sig Note  ? acetaminophen (TYLENOL) 500 MG tablet Take 2 tablets (1,000 mg total) by mouth 3 (three) times daily.   ? carbidopa-levodopa (SINEMET IR) 25-100 MG tablet Take 2 tablets by mouth See admin instructions. Take 2 tablets by mouth three times daily- 7 AM, 1 PM, and 7 PM 03/08/2021: Takes 2 tablets twice per day   ? cholecalciferol (VITAMIN D3) 25 MCG (1000 UNIT) tablet Take 2,000 Units by mouth in the morning.    ? Cyanocobalamin (VITAMIN B 12 PO) Take 1 tablet by mouth in the morning.    ? donepezil (ARICEPT) 5 MG tablet Take 5 mg by mouth at bedtime.    ? DULoxetine (CYMBALTA) 60 MG capsule TAKE 1 CAPSULE(60 MG) BY MOUTH DAILY   ? fludrocortisone (FLORINEF) 0.1 MG tablet TAKE TWO TABLETS BY MOUTH DAILY   ? MELATONIN ER PO Take 15 mg by mouth.   ? midodrine (PROAMATINE) 10 MG tablet Take 1 tablet (10 mg total) by mouth 3 (three) times daily.   ? polyethylene glycol powder (GLYCOLAX/MIRALAX) 17 GM/SCOOP powder Take 17 g by  mouth See admin instructions. Mix 17 grams of powder into 4-8 ounces of prune juice and drink once a day   ? pravastatin (PRAVACHOL) 20 MG tablet TAKE 1 TABLET BY MOUTH DAILY AT 6PM. GENERIC EQUIVALENT FOR PRAVACHOL   ? donepezil (ARICEPT) 5 MG tablet Take by mouth. (Patient not taking: Reported on 01/10/2022)   ? hydrocortisone 2.5 % cream Apply topically 2 (two) times daily. (Patient not taking: Reported on 11/28/2021)   ?  Menthol, Topical Analgesic, (BIOFREEZE) 4 % GEL Apply 1 application topically See admin instructions. Apply topically to painful sites as directed by MD (Patient not taking: Reported on 11/28/2021)   ? senna-docusate (SENOKOT-S) 8.6-50 MG tablet Take 2 tablets by mouth at bedtime as needed for mild constipation. (Patient not taking: Reported on 01/10/2022)   ? ?No facility-administered encounter medications on file as of 01/10/2022.  ? ? ?Patient Active Problem List  ? Diagnosis Date Noted  ? Pulmonary nodule   ? Lumbar compression fracture ([place]) 10/26/2019  ? DDD (degenerative disc disease), cervical 04/07/2016  ? Neuropathy   ? Bulging discs   ? Gastritis   ? Parkinson's disease ([place])   ? Hyperlipidemia   ? ? ?Conditions to be addressed/monitored:HLD and Parkinson's ? ?Care Plan : RN Care Manager Plan of Care  ?Updates made by Kassie Mends, RN since 01/10/2022 12:00 AM  ?  ? ?Problem: No plan of care established for management of chronic disease state  (Parkinsons, HLD)   ?Priority: High  ?  ? ?Long-Range Goal: Development of plan of care for chronic disease management  (Parkinsons, HLD)   ?Start Date: 01/10/2022  ?Expected End Date: 07/09/2022  ?Priority: High  ?Note:   ?Current Barriers:  ?Knowledge Deficits related to plan of care for management of HLD and Parkinson's and Falls  ?Chronic Disease Management support and education needs related to HLD and Parkinson's and Falls ?Falls ?Spoke with spouse Lois Ostrom who is primary caregiver for patient, Ms. Komatsu states patient requires assistance with ADL, IADL's, patient is able to feed himself, put on his shirt, can help some with bathing, ambulates with U-step walker, has wheelchair and ramp, shower seat. ?Patient has had falls in the past and not as many falls recently, physical therapy recently discharged patient.  Patient does not have any issues remembering but does have paranoia and hallucinations at times related to Parkinsons.   ? ?RNCM Clinical Goal(s):   ?Patient will verbalize understanding of plan for management of HLD and Parkinson's and Falls as evidenced by caregiver report, review of EHR and  through collaboration with RN Care manager, provider, and care team.  ? ?Interventions: ?1:1 collaboration with primary care provider regarding development and update of comprehensive plan of care as evidenced by provider attestation and co-signature ?Inter-disciplinary care team collaboration (see longitudinal plan of care) ?Evaluation of current treatment plan related to  self management and patient's adherence to plan as established by provider ? ?Falls Interventions:  (Status:  New goal. and Goal on track:  Yes.) Long Term Goal ?Provided written and verbal education re: potential causes of falls and Fall prevention strategies ?Advised patient of importance of notifying provider of falls ?Assessed social determinant of health barriers ? ? ?Parkinson's  (Status:  New goal. and Goal on track:  Yes.)  Long Term Goal ?Evaluation of current treatment plan related to  Parkinson's , ADL IADL limitations, Inability to perform ADL's independently, and Inability to perform IADL's independently self-management and patient's adherence to plan as established by provider. ?  Discussed plans with patient for ongoing care management follow up and provided patient with direct contact information for care management team ?Evaluation of current treatment plan related to Parkinson's and patient's adherence to plan as established by provider ?Provided education to patient re: Parkinson's management ?Discussed plans with patient for ongoing care management follow up and provided patient with direct contact information for care management team  ? ?Hyperlipidemia:  (Status: New goal. Goal on Track (progressing): YES.) Long Term Goal  ?Lab Results  ?Component Value Date  ? CHOL 124 04/25/2019  ? HDL 41 04/25/2019  ? [place] 67 04/25/2019  ? TRIG 76 04/25/2019  ? CHOLHDL 3.0 04/25/2019  ?   ?Medication review performed; medication list updated in electronic medical record.  ?Provider established cholesterol goals reviewed; ?Counseled on importance of regular laboratory monitoring as prescribed; ?R

## 2022-02-04 ENCOUNTER — Other Ambulatory Visit: Payer: Self-pay | Admitting: Family Medicine

## 2022-02-04 DIAGNOSIS — E785 Hyperlipidemia, unspecified: Secondary | ICD-10-CM

## 2022-02-05 NOTE — Telephone Encounter (Signed)
Requested Prescriptions  Pending Prescriptions Disp Refills  . pravastatin (PRAVACHOL) 20 MG tablet [Pharmacy Med Name: PRAVASTATIN '20MG'$  TABLETS] 90 tablet 1    Sig: TAKE 1 TABLET BY MOUTH DAILY AT 6PM. GENERIC EQUIVALENT FOR PRAVACHOL     Cardiovascular:  Antilipid - Statins Failed - 02/04/2022  6:53 AM      Failed - Lipid Panel in normal range within the last 12 months    Cholesterol  Date Value Ref Range Status  04/25/2019 124 <200 mg/dL Final   LDL Cholesterol (Calc)  Date Value Ref Range Status  04/25/2019 67 mg/dL (calc) Final    Comment:    Reference range: <100 . Desirable range <100 mg/dL for primary prevention;   <70 mg/dL for patients with CHD or diabetic patients  with > or = 2 CHD risk factors. Marland Kitchen LDL-C is now calculated using the Martin-Hopkins  calculation, which is a validated novel method providing  better accuracy than the Friedewald equation in the  estimation of LDL-C.  Cresenciano Genre et al. Annamaria Helling. 1017;510(25): 2061-2068  (http://education.[employer].com/faq/FAQ164)    HDL  Date Value Ref Range Status  04/25/2019 41 > OR = 40 mg/dL Final   Triglycerides  Date Value Ref Range Status  04/25/2019 76 <150 mg/dL Final         Passed - Patient is not pregnant      Passed - Valid encounter within last 12 months    Recent Outpatient Visits          2 months ago Parkinson's disease ([place])   [place] Pickard, Cammie Mcgee, MD   4 months ago Primary parkinsonism [facility])   [place] Susy Frizzle, MD   8 months ago Need for immunization against influenza   [place] Pickard, Cammie Mcgee, MD   1 year ago Primary parkinsonism [facility])   [place] Susy Frizzle, MD   1 year ago Orthostatic hypotension   [place], Cammie Mcgee, MD      Future Appointments            In 3 months Pickard, Cammie Mcgee, MD [place]

## 2022-03-10 DIAGNOSIS — R269 Unspecified abnormalities of gait and mobility: Secondary | ICD-10-CM | POA: Diagnosis not present

## 2022-03-10 DIAGNOSIS — F0280 Dementia in other diseases classified elsewhere without behavioral disturbance: Secondary | ICD-10-CM | POA: Diagnosis not present

## 2022-03-10 DIAGNOSIS — M21371 Foot drop, right foot: Secondary | ICD-10-CM | POA: Diagnosis not present

## 2022-03-10 DIAGNOSIS — R1312 Dysphagia, oropharyngeal phase: Secondary | ICD-10-CM | POA: Diagnosis not present

## 2022-03-10 DIAGNOSIS — Z79899 Other long term (current) drug therapy: Secondary | ICD-10-CM | POA: Diagnosis not present

## 2022-03-10 DIAGNOSIS — M21372 Foot drop, left foot: Secondary | ICD-10-CM | POA: Diagnosis not present

## 2022-03-10 DIAGNOSIS — R443 Hallucinations, unspecified: Secondary | ICD-10-CM | POA: Diagnosis not present

## 2022-03-10 DIAGNOSIS — G20 Parkinson's disease: Secondary | ICD-10-CM | POA: Diagnosis not present

## 2022-03-10 DIAGNOSIS — G609 Hereditary and idiopathic neuropathy, unspecified: Secondary | ICD-10-CM | POA: Diagnosis not present

## 2022-03-10 DIAGNOSIS — I951 Orthostatic hypotension: Secondary | ICD-10-CM | POA: Diagnosis not present

## 2022-03-12 ENCOUNTER — Other Ambulatory Visit: Payer: Self-pay | Admitting: Psychiatry

## 2022-03-12 ENCOUNTER — Other Ambulatory Visit (HOSPITAL_COMMUNITY): Payer: Self-pay | Admitting: Psychiatry

## 2022-03-17 ENCOUNTER — Ambulatory Visit: Payer: Medicare Other | Admitting: *Deleted

## 2022-03-17 DIAGNOSIS — R296 Repeated falls: Secondary | ICD-10-CM

## 2022-03-17 DIAGNOSIS — G20 Parkinson's disease: Secondary | ICD-10-CM

## 2022-03-17 DIAGNOSIS — E785 Hyperlipidemia, unspecified: Secondary | ICD-10-CM

## 2022-03-17 DIAGNOSIS — G20A1 Parkinson's disease without dyskinesia, without mention of fluctuations: Secondary | ICD-10-CM

## 2022-03-17 NOTE — Patient Instructions (Signed)
Visit Information  Thank you for taking time to visit with me today. Please don't hesitate to contact me if I can be of assistance to you before our next scheduled telephone appointment.  Following are the goals we discussed today:  Take medications as prescribed   Attend all scheduled provider appointments Call pharmacy for medication refills 3-7 days in advance of running out of medications Call provider office for new concerns or questions  call for medicine refill 2 or 3 days before it runs out take all medications exactly as prescribed call doctor with any symptoms you believe are related to your medicine call doctor when you experience any new symptoms go to all doctor appointments as scheduled adhere to prescribed diet: heart healthy Follow safety precautions, use your equipment- walker and wheelchair fall prevention strategies: change position slowly, use assistive device such as walker or cane (per provider recommendations) when walking, keep walkways clear, have good lighting in room. It is important to contact your provider if you have any falls, maintain muscle strength/tone by exercise per provider recommendations. Please adhere to aspiration precautions to avoid aspirating liquids or food into your lungs Please check again in a few days about modified barium swallow test to make sure [place] gets the order   Our next appointment is by telephone on 05/09/22 at 1045 am  Please call the care guide team at [phone] if you need to cancel or reschedule your appointment.   If you are experiencing a Mental Health or [place] or need someone to talk to, please call the Suicide and Crisis Lifeline: 988 call the [place] National Suicide Prevention Lifeline: (914)253-4566 or TTY: (925) 474-1397 TTY 7733115630) to talk to a trained counselor call 1-800-273-TALK (toll free, 24 hour hotline) go to [facility] [address]  [place] [phone]) call 911   Patient verbalizes understanding of instructions and care plan provided today and agrees to view in [place]. Active MyChart status and patient understanding of how to access instructions and care plan via MyChart confirmed with patient.     Telephone follow up appointment with care management team member scheduled for:  05/09/22  Jacqlyn Larsen [facility], BSN RN Case Manager [place] [facility] [phone] Aspiration Precautions, Adult Aspiration is the breathing in (inhalation) of a liquid or other material into the lungs. Adults with neurologicalimpairments, swallowing difficulties (dysphagia), decreased gag reflex, or impaired physical mobility are at risk for aspiration. Things that can be inhaled into the lungs include: Food. Any type of liquid, such as drinks or saliva. Stomach contents, such as vomit or stomach acid. Aspiration can cause pneumonia. You can take steps to reduce the risk of aspiration. What are the signs of aspiration? Signs of aspiration include: Coughing: Coughing after swallowing food or liquids. Coughing up phlegm (sputum) that: Is yellow, tan, or green. Has pieces of food in it. Smells bad. Coughing when lying down or having to sit up quickly after lying down. Having a hoarse, barky cough. Trouble breathing. This may include: Breathing quickly. Breathing very slowly. Loud breathing. Rumbling sounds from the lungs while breathing. Eating troubles, such as: Clearing the throat often while eating. Drooling while eating. A feeling of fullness in the throat or a feeling that something is stuck in the throat. Having a runny nose while eating. Watery eyes while eating. A pained look on the face while eating. Speaking problems such as: Not being able to speak. A hoarse voice. Choking often. A change in skin color.  The skin may look red or blue. Fever. Pain in the chest or back. What are the complications of  aspiration? Complications of aspiration include: Losing weight because the person is not absorbing needed nutrients. Loss of enjoyment and the social benefits of eating. Choking. Lung irritation, if someone aspirates acidic food or drinks. Lung infection (pneumonia). Lung abscess, which is a collection of infected liquid (pus) in the lungs. In serious cases, death can occur. What can I do to prevent aspiration? Caring for someone who has a feeding tube If you are caring for someone who has a feeding tube and cannot eat or drink safely through his or her mouth: Keep the person in an upright position as much as possible. Do not lay the person flat if he or she is getting continuous feedings. If you need to lay the person flat for any reason, turn the feeding pump off. Check feeding tube residuals as told by the health care provider. Ask the health care provider what residual amount is too high. Caring for someone who can eat and drink safely by mouth If you are caring for someone who can eat and drink safely by mouth: Have the person sit in an upright position when eating food or drinking fluids. This can be done in two ways: Have the person sit up in a chair. If sitting in a chair is not possible, position the person in bed so he or she is upright. Remind the person to eat slowly and chew well. Make sure the person is awake and alert while eating. Never put food or liquids in the mouth of a person who is not fully alert. Do not distract the person. This is especially important for people with thinking or memory (cognitive) problems. Allow foods to cool. Hot foods may be more difficult to swallow. Provide small meals more frequently instead of three large meals. This may reduce fatigue during eating. After the person has finishing eating: Check the person's mouth thoroughly for leftover food. Keep the person sitting upright for 30-45 minutes. Do not serve food or drink within 2 hours of  bedtime. General instructions Follow these general guidelines to prevent aspiration in someone who can eat and drink safely by mouth: Feed small amounts of food. Do not force-feed. For a person who is on a diet for swallowing difficulty (dysphagia diet), follow the recommended food and drink consistency. For example, you may be told to thicken a liquid using a commercial food thickening product. Use as little water as possible when brushing the person's teeth or cleaning his or her mouth. Provide oral care before and after meals. Use adaptive devices, such as cut-out cups or other utensils, as told by the health care provider. Crush pills and put them in soft food such as pudding or ice cream. Some pills should not be crushed. Check with the health care provider before crushing any medicine. If someone is choking on food or an object, perform the Heimlich maneuver (abdominal thrusts).  Contact a health care provider if the person: Has a feeding tube, and the feeding tube residual amount is too high. Has a fever. Tries to avoid food or water, such as refusing to eat, drink, or be fed, or is eating less than normal. May have aspirated food or liquid. Is showing warning signs, such as choking or coughing, when he or she eats or drinks. Get help right away if the person: Has trouble breathing or starts to breathe quickly. Is breathing very slowly or  stops breathing. Coughs a lot after eating or drinking. Has a long-lasting (chronic) cough. Coughs up thick, yellow, or tan phlegm. Has symptoms of pneumonia, such as: Coughing a lot. Coughing up mucus with a bad smell or blood in it. Feeling short of breath. Complaining of chest pain. Sweating, fever, and chills. Feeling tired. Complaining of trouble breathing. Wheezing. Cannot stop choking. Is unable to breathe, turns blue, faints, or seems confused. These symptoms may represent a serious problem that is an emergency. Do not wait to see if  the symptoms will go away. Get medical help right away. Call your local emergency services (911 in the [place]  Summary Aspiration is the breathing in (inhalation) of a liquid or material into the lungs. Things that can be inhaled into the lungs include food, liquids, saliva, or stomach contents. Aspiration can cause pneumonia or choking. One sign of aspiration is coughing after swallowing food or liquids. Contact your health care provider if you notice signs of aspiration. This information is not intended to replace advice given to you by your health care provider. Make sure you discuss any questions you have with your health care provider. Document Revised: 05/01/2020 Document Reviewed: 08/29/2019 Elsevier Patient Education  [place].

## 2022-03-17 NOTE — Chronic Care Management (AMB) (Signed)
Care Management    RN Visit Note  03/17/2022 Name: Alex Frazier MRN: [id number] DOB: Sep 13, 1945  Subjective: Alex Frazier is a 76 y.o. year old male who is a primary care Alex Frazier of Pickard, Cammie Mcgee, MD. The care management team was consulted for assistance with disease management and care coordination needs.    Engaged with Alex Frazier by telephone for follow up visit in response to provider referral for case management and/or care coordination services.   Consent to Services:   Alex Frazier was given information about Care Management services today including:  Care Management services includes personalized support from designated clinical staff supervised by his physician, including individualized plan of care and coordination with other care providers 24/7 contact phone numbers for assistance for urgent and routine care needs. The Alex Frazier may stop case management services at any time by phone call to the office staff.  Alex Frazier agreed to services and consent obtained.   Assessment: Review of Alex Frazier past medical history, allergies, medications, health status, including review of consultants reports, laboratory and other test data, was performed as part of comprehensive evaluation and provision of chronic care management services.   SDOH (Social Determinants of Health) assessments and interventions performed:    Care Plan  Allergies  Allergen Reactions   Tape Other (See Comments)    SKIN IS VERY THIN- TEARS VERY EASILY!!   Hydrocodone-Acetaminophen Other (See Comments)    Hallucinations and confusion   Indomethacin Other (See Comments)    Unknown reaction   Lyrica [Pregabalin] Other (See Comments)    Vivid hallucinations   Quetiapine Other (See Comments)    HYPOtension!!    Outpatient Encounter Medications as of 03/17/2022  Medication Sig Note   acetaminophen (TYLENOL) 500 MG tablet Take 2 tablets (1,000 mg total) by mouth 3 (three) times daily.    carbidopa-levodopa (SINEMET  IR) 25-100 MG tablet Take 2 tablets by mouth See admin instructions. Take 2 tablets by mouth three times daily- 7 AM, 1 PM, and 7 PM 03/08/2021: Takes 2 tablets twice per day    cholecalciferol (VITAMIN D3) 25 MCG (1000 UNIT) tablet Take 2,000 Units by mouth in the morning.     Cyanocobalamin (VITAMIN B 12 PO) Take 1 tablet by mouth in the morning.     donepezil (ARICEPT) 5 MG tablet Take 5 mg by mouth at bedtime.     DULoxetine (CYMBALTA) 60 MG capsule TAKE 1 CAPSULE(60 MG) BY MOUTH DAILY    fludrocortisone (FLORINEF) 0.1 MG tablet TAKE TWO TABLETS BY MOUTH DAILY    midodrine (PROAMATINE) 10 MG tablet Take 1 tablet (10 mg total) by mouth 3 (three) times daily.    polyethylene glycol powder (GLYCOLAX/MIRALAX) 17 GM/SCOOP powder Take 17 g by mouth See admin instructions. Mix 17 grams of powder into 4-8 ounces of prune juice and drink once a day    pravastatin (PRAVACHOL) 20 MG tablet TAKE 1 TABLET BY MOUTH DAILY AT 6PM. GENERIC EQUIVALENT FOR PRAVACHOL    donepezil (ARICEPT) 5 MG tablet Take by mouth. (Alex Frazier not taking: Reported on 01/10/2022)    hydrocortisone 2.5 % cream Apply topically 2 (two) times daily. (Alex Frazier not taking: Reported on 11/28/2021)    MELATONIN ER PO Take 15 mg by mouth. (Alex Frazier not taking: Reported on 03/17/2022)    Menthol, Topical Analgesic, (BIOFREEZE) 4 % GEL Apply 1 application topically See admin instructions. Apply topically to painful sites as directed by MD (Alex Frazier not taking: Reported on 11/28/2021)    senna-docusate (SENOKOT-S) 8.6-50  MG tablet Take 2 tablets by mouth at bedtime as needed for mild constipation. (Alex Frazier not taking: Reported on 01/10/2022)    No facility-administered encounter medications on file as of 03/17/2022.    Alex Frazier Active Problem List   Diagnosis Date Noted   Pulmonary nodule    Lumbar compression fracture ([place]) 10/26/2019   DDD (degenerative disc disease), cervical 04/07/2016   Neuropathy    Bulging discs    Gastritis    Parkinson's  disease ([place])    Hyperlipidemia     Conditions to be addressed/monitored: HLD and Parkinson's and Falls  Care Plan : RN Care Manager Plan of Care  Updates made by Kassie Mends, RN since 03/17/2022 12:00 AM     Problem: No plan of care established for management of chronic disease state  (Parkinsons, HLD)   Priority: High     Long-Range Goal: Development of plan of care for chronic disease management  (Parkinsons, HLD)   Start Date: 01/10/2022  Expected End Date: 07/09/2022  Priority: High  Note:   Current Barriers:  Knowledge Deficits related to plan of care for management of HLD and Parkinson's and Falls  Chronic Disease Management support and education needs related to HLD and Parkinson's and Falls Falls Spoke with Alex Frazier who is primary caregiver for Alex Frazier, Alex Frazier states Alex Frazier requires assistance with ADL, IADL's, Alex Frazier is able to feed himself, put on his shirt, can help some with bathing, ambulates with U-step walker, has wheelchair and ramp, shower seat. Alex Frazier has had falls in the past and not as many falls recently, physical therapy has worked with Alex Frazier several months ago in the home.  Alex Frazier does not have any issues remembering but does have paranoia and hallucinations at times related to Parkinsons.   Alex reports pt followed up with neurologist at [facility] on 03/10/22 and modified barium swallow ordered, Alex does not want to drive back to [facility] for this and has requested the order be sent to [facility], Alex states if she does not hear anything back in a few days she will check back.  Alex reports pt is declining more, harder to get moving, does not want to do exercises prescribed by PT, trying to be as careful as possible with swallowing/ aspiration precautions, Alex reiterated pt does not qualify for medicaid, has a daughter that can assist some (works full time), does not want pt to be placed into any long term care facility, has had no falls  since last outreach.  RNCM Clinical Goal(s):  Alex Frazier will verbalize understanding of plan for management of HLD and Parkinson's and Falls as evidenced by caregiver report, review of EHR and  through collaboration with RN Care manager, provider, and care team.   Interventions: 1:1 collaboration with primary care provider regarding development and update of comprehensive plan of care as evidenced by provider attestation and co-signature Inter-disciplinary care team collaboration (see longitudinal plan of care) Evaluation of current treatment plan related to  self management and Alex Frazier's adherence to plan as established by provider  Falls Interventions:  (Status:  New goal. and Goal on track:  Yes.) Long Term Goal Provided written and verbal education re: potential causes of falls and Fall prevention strategies Advised Alex Frazier of importance of notifying provider of falls Assessed social determinant of health barriers Reviewed importance of rising slowly, taking your time, always asking for assistance as needed and using walker at all times   Parkinson's  (Status:  New goal. and Goal on track:  Yes.)  Long Term Goal Evaluation of current treatment plan related to  Parkinson's , ADL IADL limitations, Inability to perform ADL's independently, and Inability to perform IADL's independently self-management and Alex Frazier's adherence to plan as established by provider. Discussed plans with Alex Frazier for ongoing care management follow up and provided Alex Frazier with direct contact information for care management team Evaluation of current treatment plan related to Parkinson's and Alex Frazier's adherence to plan as established by provider Provided education to Alex Frazier re: Parkinson's management Reviewed importance of changing positions q 2 hours Reviewed aspiration precautions  Hyperlipidemia:  (Status: New goal. Goal on Track (progressing): YES.) Long Term Goal  Lab Results  Component Value Date   CHOL 124  04/25/2019   HDL 41 04/25/2019   LDLCALC 67 04/25/2019   TRIG 76 04/25/2019   CHOLHDL 3.0 04/25/2019    Medication review performed; medication list updated in electronic medical record.  Provider established cholesterol goals reviewed; Counseled on importance of regular laboratory monitoring as prescribed; Reviewed role and benefits of statin for ASCVD risk reduction; Reviewed importance of limiting foods high in cholesterol; Assessed social determinant of health barriers;  Reinforced heart healthy diet  Alex Frazier Goals/Self-Care Activities: Take medications as prescribed   Attend all scheduled provider appointments Call pharmacy for medication refills 3-7 days in advance of running out of medications Call provider office for new concerns or questions  - call for medicine refill 2 or 3 days before it runs out - take all medications exactly as prescribed - call doctor with any symptoms you believe are related to your medicine - call doctor when you experience any new symptoms - go to all doctor appointments as scheduled - adhere to prescribed diet: heart healthy Follow safety precautions, use your equipment- walker and wheelchair fall prevention strategies: change position slowly, use assistive device such as walker or cane (per provider recommendations) when walking, keep walkways clear, have good lighting in room. It is important to contact your provider if you have any falls, maintain muscle strength/tone by exercise per provider recommendations. Please adhere to aspiration precautions to avoid aspirating liquids or food into your lungs Please check again in a few days about modified barium swallow test to make sure [facility] gets the order        Plan: Telephone follow up appointment with care management team member scheduled for:  05/09/22  Jacqlyn Larsen [facility], BSN RN Case Manager [facility] [phone]

## 2022-03-24 ENCOUNTER — Other Ambulatory Visit (HOSPITAL_COMMUNITY): Payer: Self-pay

## 2022-03-24 DIAGNOSIS — R059 Cough, unspecified: Secondary | ICD-10-CM

## 2022-03-24 DIAGNOSIS — R1310 Dysphagia, unspecified: Secondary | ICD-10-CM

## 2022-04-08 ENCOUNTER — Encounter (HOSPITAL_COMMUNITY): Payer: Self-pay

## 2022-04-08 ENCOUNTER — Ambulatory Visit (HOSPITAL_COMMUNITY)
Admission: RE | Admit: 2022-04-08 | Discharge: 2022-04-08 | Disposition: A | Discharge status: Home / Self Care | Payer: Medicare Other | Source: Ambulatory Visit | Attending: Psychiatry | Admitting: Psychiatry

## 2022-04-08 ENCOUNTER — Ambulatory Visit (HOSPITAL_COMMUNITY): Payer: Medicare Other

## 2022-04-08 DIAGNOSIS — R1310 Dysphagia, unspecified: Secondary | ICD-10-CM | POA: Diagnosis not present

## 2022-04-08 DIAGNOSIS — R1313 Dysphagia, pharyngeal phase: Secondary | ICD-10-CM | POA: Diagnosis not present

## 2022-04-08 DIAGNOSIS — R059 Cough, unspecified: Secondary | ICD-10-CM

## 2022-05-09 ENCOUNTER — Ambulatory Visit: Payer: Self-pay | Admitting: *Deleted

## 2022-05-09 NOTE — Patient Outreach (Addendum)
Care Coordination   Initial Visit Note   05/09/2022 Name: Alex Frazier MRN: [id number] DOB: 06/10/1946  Alex Frazier is a 76 y.o. year old male who sees Pickard, Cammie Mcgee, MD for primary care. I spoke with  Alex Frazier by phone today.  What matters to the patients health and wellness today?  Getting him in the shower and out of the home safely without injury to wife or Falls  This week he fell from recliner head first reaching for an item Has a Forehead rug burn   In last 3 months estimated falls are 3-4 Getting him in shower tasking and out of the home. Alex Frazier is the primary care giver Alex Frazier is not being able to move independently and after the previous home health therapies he does not continue to exercises taught and returns to poor mobility. He has to be assist with dressing, bathing He does not generally use any of the durable medical equipment (DME) in his home only when outside to include cane, New step Rollator and wheelchair They do not qualify for medicaid services related to real estate   05/08/22 He was reported to have had a bad day related to paranoid  Denies possible urinary tract infection (UTI) Wife denies urine odor, no fever   Reviewed 04/08/22 swallow test impress defer tx plan fu with SLP not issues with solid only liquids Regular solids;Dysphagia 3 (Mech soft) solids;Thin liquid   Liquid Administration via Cup;No straw;Spoon  Medication Administration Whole meds with puree  Compensations Slow rate;Small sips/bites  Postural Changes Remain semi-upright after feeds/meals (Comment);Seated upright at 90 degrees   Oral care bid Has upper and lower dentures     Goals Addressed               This Visit's Progress     Patient Stated     Community resources to assist with safe Showers, transfers to car [facility]) (pt-stated)   Not on track     Care Coordination Interventions: Evaluation of current treatment plan related to showering, getting in the car,  caregiver strain and patient's adherence to plan as established by provider Discussed plans with patient for ongoing care management follow up and provided patient with direct contact information for care management team Discuss PACE, Adult day centers, socialization, respite care & the importance of preventing care giver burnout Active listening with encouragement Assessed for family support, equipment, in home care services Inquire about referrals to Social work and/or care guides                   Decrease falls ([facility]) (pt-stated)   Not on track     Care Coordination Interventions: Assessed for falls since last encounter Provided patient information for fall alert systems Screening for signs and symptoms of depression related to chronic disease state  Assessed social determinant of health barriers Confirmed assistive devices, incontinence, poor balance, confusion (medicines), Ruled out urinary tract infection, non compliance with exercises taught during home health sessions (especially after sessions end)        SDOH assessments and interventions completed:  Yes  SDOH Interventions Today    Flowsheet Row Most Recent Value  SDOH Interventions   Food Insecurity Interventions Intervention Not Indicated  Housing Interventions Intervention Not Indicated  Transportation Interventions Intervention Not Indicated  Utilities Interventions Intervention Not Indicated  Financial Strain Interventions Intervention Not Indicated  Stress Interventions Intervention Not Indicated  Social Connections Interventions Intervention Not Indicated  Care Coordination Interventions Activated:  Yes  Care Coordination Interventions:  Yes, provided   Follow up plan: Follow up call scheduled for 06/09/22 1045    Encounter Outcome:  Pt. Visit Completed   Alex Hantz L. Lavina Hamman, RN, BSN, [place] Coordinator Office number [phone]

## 2022-05-09 NOTE — Patient Instructions (Addendum)
Visit Information  Thank you for taking time to visit with me today. Please don't hesitate to contact me if I can be of assistance to you.   Following are the goals we discussed today:   Goals Addressed               This Visit's Progress     Patient Stated     Community resources to assist with safe Showers, transfers to car [facility]) (pt-stated)   Not on track     Care Coordination Interventions: Evaluation of current treatment plan related to showering, getting in the car, caregiver strain and patient's adherence to plan as established by provider Discussed plans with patient for ongoing care management follow up and provided patient with direct contact information for care management team Discuss PACE, Adult day centers, socialization, respite care & the importance of preventing care giver burnout Active listening with encouragement Assessed for family support, equipment, in home care services Inquire about referrals to Social work and/or care guides                   Decrease falls [facility]) (pt-stated)   Not on track     Care Coordination Interventions: Assessed for falls since last encounter Provided patient information for fall alert systems Screening for signs and symptoms of depression related to chronic disease state  Assessed social determinant of health barriers Confirmed assistive devices, incontinence, poor balance, confusion (medicines), Ruled out urinary tract infection, non compliance with exercises taught during home health sessions (especially after sessions end)        Our next appointment is by telephone on 06/09/22 at 1045 am  Please call the care guide team at [phone] if you need to cancel or reschedule your appointment.   If you are experiencing a Mental Health or [place] or need someone to talk to, please call the Suicide and Crisis Lifeline: 988 call the [place] National Suicide Prevention Lifeline: 860-052-8841 or TTY: 810 057 7836 TTY  415-379-2554) to talk to a trained counselor call 1-800-273-TALK (toll free, 24 hour hotline) call the [facility]: [phone] call 911   Patient verbalizes understanding of instructions and care plan provided today and agrees to view in [place]. Active MyChart status and patient understanding of how to access instructions and care plan via MyChart confirmed with patient.     The patient has been provided with contact information for the care management team and has been advised to call with any health related questions or concerns.   Emerson Lavina Hamman, RN, BSN, [place] Coordinator Office number [phone]

## 2022-05-29 ENCOUNTER — Ambulatory Visit: Payer: Self-pay | Admitting: Family Medicine

## 2022-06-06 ENCOUNTER — Ambulatory Visit (INDEPENDENT_AMBULATORY_CARE_PROVIDER_SITE_OTHER): Payer: Medicare Other | Admitting: Family Medicine

## 2022-06-06 VITALS — BP 140/72 | HR 70 | Ht 66.0 in | Wt 150.0 lb

## 2022-06-06 DIAGNOSIS — G20A1 Parkinson's disease without dyskinesia, without mention of fluctuations: Secondary | ICD-10-CM

## 2022-06-06 DIAGNOSIS — I951 Orthostatic hypotension: Secondary | ICD-10-CM | POA: Diagnosis not present

## 2022-06-06 DIAGNOSIS — Z23 Encounter for immunization: Secondary | ICD-10-CM

## 2022-06-06 DIAGNOSIS — R296 Repeated falls: Secondary | ICD-10-CM | POA: Diagnosis not present

## 2022-06-06 DIAGNOSIS — G20 Parkinson's disease: Secondary | ICD-10-CM

## 2022-06-06 NOTE — Progress Notes (Signed)
Subjective:    Patient ID: Alex Frazier, male    DOB: 12-10-1945, 76 y.o.   MRN: [id number]  HPI Patient is here today for checkup.  He is accompanied by his wife.  He is sitting in a wheelchair.  He falls frequently.  Due to his Parkinson's disease, his balance is very poor.  He also has drops in blood pressure every time he stands.  He is taking fludrocortisone every morning to try to keep his blood pressure elevated.  Wife states that occasionally he will elucidates however the hallucinations are not troublesome.  They did not provide fever or agitation or violence or combativeness.  She is able to quickly change the subject.  She did recently reduce the Sinemet to 1-1/2 tablets 3 times a day.  He is walking at home with walker and perhaps he has become more stiff and rigid since reducing the dose.  He denies any cough or chest pain or pleurisy or hemoptysis.  He denies any visible aspiration or witnessed aspiration.  He denies any constipation or melena or hematochezia.  He is very unsteady on his feet and can only walk short distances with maximum assistance using a walker  Past Medical History:  Diagnosis Date   B12 deficiency    Borderline   Bulging discs    Degenerative disc disease, cervical    Gastritis    Hyperlipidemia    Neuropathy    Parkinson's disease ([place])    Parkinsonian syndrome (HCC)    Peripheral neuropathy    TIA (transient ischemic attack)    TIA symptoms from hypotension   Past Surgical History:  Procedure Laterality Date   APPENDECTOMY     BACK SURGERY     Current Outpatient Medications on File Prior to Visit  Medication Sig Dispense Refill   acetaminophen (TYLENOL) 500 MG tablet Take 2 tablets (1,000 mg total) by mouth 3 (three) times daily. 30 tablet 0   carbidopa-levodopa (SINEMET IR) 25-100 MG tablet Take 2 tablets by mouth See admin instructions. Take 2 tablets by mouth three times daily- 7 AM, 1 PM, and 7 PM     cholecalciferol (VITAMIN D3) 25 MCG  (1000 UNIT) tablet Take 2,000 Units by mouth in the morning.      Cyanocobalamin (VITAMIN B 12 PO) Take 1 tablet by mouth in the morning.      donepezil (ARICEPT) 5 MG tablet Take 5 mg by mouth at bedtime.      donepezil (ARICEPT) 5 MG tablet Take by mouth. (Patient not taking: Reported on 01/10/2022)     DULoxetine (CYMBALTA) 60 MG capsule TAKE 1 CAPSULE(60 MG) BY MOUTH DAILY 90 capsule 3   fludrocortisone (FLORINEF) 0.1 MG tablet TAKE TWO TABLETS BY MOUTH DAILY 180 tablet 3   hydrocortisone 2.5 % cream Apply topically 2 (two) times daily. (Patient not taking: Reported on 11/28/2021) 30 g 0   MELATONIN ER PO Take 15 mg by mouth. (Patient not taking: Reported on 03/17/2022)     Menthol, Topical Analgesic, (BIOFREEZE) 4 % GEL Apply 1 application topically See admin instructions. Apply topically to painful sites as directed by MD (Patient not taking: Reported on 11/28/2021)     midodrine (PROAMATINE) 10 MG tablet Take 1 tablet (10 mg total) by mouth 3 (three) times daily. 270 tablet 3   polyethylene glycol powder (GLYCOLAX/MIRALAX) 17 GM/SCOOP powder Take 17 g by mouth See admin instructions. Mix 17 grams of powder into 4-8 ounces of prune juice and drink once a day  pravastatin (PRAVACHOL) 20 MG tablet TAKE 1 TABLET BY MOUTH DAILY AT 6PM. GENERIC EQUIVALENT FOR PRAVACHOL 90 tablet 1   senna-docusate (SENOKOT-S) 8.6-50 MG tablet Take 2 tablets by mouth at bedtime as needed for mild constipation. (Patient not taking: Reported on 01/10/2022) 60 tablet 2   No current facility-administered medications on file prior to visit.   Allergies  Allergen Reactions   Tape Other (See Comments)    SKIN IS VERY THIN- TEARS VERY EASILY!!   Hydrocodone-Acetaminophen Other (See Comments)    Hallucinations and confusion   Indomethacin Other (See Comments)    Unknown reaction   Lyrica [Pregabalin] Other (See Comments)    Vivid hallucinations   Quetiapine Other (See Comments)    HYPOtension!!   Social History    Socioeconomic History   Marital status: Married    Spouse name: Not on file   Number of children: 3   Years of education: Not on file   Highest education level: High school graduate  Occupational History   Not on file  Tobacco Use   Smoking status: Never   Smokeless tobacco: Never  Vaping Use   Vaping Use: Never used  Substance and Sexual Activity   Alcohol use: No   Drug use: No   Sexual activity: Not on file  Other Topics Concern   Not on file  Social History Narrative   Lives at home with his wife   Right handed   Caffeine: none   Social Determinants of Health   Financial Resource Strain: Low Risk  (05/09/2022)   Overall Financial Resource Strain (CARDIA)    Difficulty of Paying Living Expenses: Not very hard  Food Insecurity: No Food Insecurity (05/09/2022)   Hunger Vital Sign    Worried About Running Out of Food in the Last Year: Never true    Ran Out of Food in the Last Year: Never true  Transportation Needs: No Transportation Needs (05/09/2022)   PRAPARE - Hydrologist (Medical): No    Lack of Transportation (Non-Medical): No  Physical Activity: Inactive (03/08/2021)   Exercise Vital Sign    Days of Exercise per Week: 0 days    Minutes of Exercise per Session: 0 min  Stress: No Stress Concern Present (05/09/2022)   [place]    Feeling of Stress : Only a little  Social Connections: Socially Integrated (05/09/2022)   Social Connection and Isolation Panel [NHANES]    Frequency of Communication with Friends and Family: More than three times a week    Frequency of Social Gatherings with Friends and Family: More than three times a week    Attends Religious Services: 1 to 4 times per year    Active Member of [employer] or Organizations: Yes    Attends Archivist Meetings: 1 to 4 times per year    Marital Status: Married  Human resources officer Violence: Not At Risk (05/09/2022)    Humiliation, Afraid, Rape, and Kick questionnaire    Fear of Current or Ex-Partner: No    Emotionally Abused: No    Physically Abused: No    Sexually Abused: No     Review of Systems  All other systems reviewed and are negative.      Objective:   Physical Exam Constitutional:      Appearance: Normal appearance. He is well-groomed and normal weight. He is not ill-appearing or toxic-appearing.  Cardiovascular:     Rate and Rhythm: Normal rate  and regular rhythm.     Heart sounds: Normal heart sounds.  Pulmonary:     Effort: Pulmonary effort is normal.     Breath sounds: Normal breath sounds.  Neurological:     Mental Status: He is alert and oriented to person, place, and time. Mental status is at baseline.     Motor: Weakness present.     Coordination: Coordination abnormal.     Gait: Gait abnormal.  Psychiatric:        Mood and Affect: Mood normal.         Assessment & Plan:  Parkinson's disease - Plan: CBC with Differential/Platelet, COMPLETE METABOLIC PANEL WITH GFR  Falls frequently  Orthostatic hypotension - Plan: CBC with Differential/Platelet, COMPLETE METABOLIC PANEL WITH GFR Patient received his flu shot today.  I would recommend a COVID booster as well as the RSV vaccination.  Blood pressure is well controlled today.  Wife is maintaining his blood pressure using midodrine twice a day and 2 tablets of fludrocortisone daily.  They deny any syncope or chest pain or orthopnea or paroxysmal nocturnal dyspnea.  He does not appear fluid overloaded.  Patient appears stable today.  I will check a CBC and a CMP

## 2022-06-06 NOTE — Addendum Note (Signed)
Addended by: Randal Buba K on: 06/06/2022 02:39 PM   Modules accepted: Orders

## 2022-06-07 LAB — COMPLETE METABOLIC PANEL WITH GFR
AG Ratio: 1.8 (calc) (ref 1.0–2.5)
ALT: 13 U/L (ref 9–46)
AST: 15 U/L (ref 10–35)
Albumin: 4.1 g/dL (ref 3.6–5.1)
Alkaline phosphatase (APISO): 115 U/L (ref 35–144)
BUN: 14 mg/dL (ref 7–25)
CO2: 28 mmol/L (ref 20–32)
Calcium: 9.2 mg/dL (ref 8.6–10.3)
Chloride: 104 mmol/L (ref 98–110)
Creat: 0.7 mg/dL (ref 0.70–1.28)
Globulin: 2.3 g/dL (calc) (ref 1.9–3.7)
Glucose, Bld: 92 mg/dL (ref 65–99)
Potassium: 4.1 mmol/L (ref 3.5–5.3)
Sodium: 141 mmol/L (ref 135–146)
Total Bilirubin: 0.5 mg/dL (ref 0.2–1.2)
Total Protein: 6.4 g/dL (ref 6.1–8.1)
eGFR: 96 mL/min/{1.73_m2} (ref >=60)

## 2022-06-07 LAB — CBC WITH DIFFERENTIAL/PLATELET
Absolute Monocytes: 488 cells/uL (ref 200–950)
Basophils Absolute: 42 cells/uL (ref 0–200)
Basophils Relative: 0.8 %
Eosinophils Absolute: 127 cells/uL (ref 15–500)
Eosinophils Relative: 2.4 %
HCT: 41.6 % (ref 38.5–50.0)
Hemoglobin: 14.2 g/dL (ref 13.2–17.1)
Lymphs Abs: 816 cells/uL — ABNORMAL LOW (ref 850–3900)
MCH: 30.7 pg (ref 27.0–33.0)
MCHC: 34.1 g/dL (ref 32.0–36.0)
MCV: 90 fL (ref 80.0–100.0)
MPV: 10 fL (ref 7.5–12.5)
Monocytes Relative: 9.2 %
Neutro Abs: 3827 cells/uL (ref 1500–7800)
Neutrophils Relative %: 72.2 %
Platelets: 264 10*3/uL (ref 140–400)
RBC: 4.62 10*6/uL (ref 4.20–5.80)
RDW: 12.8 % (ref 11.0–15.0)
Total Lymphocyte: 15.4 %
WBC: 5.3 10*3/uL (ref 3.8–10.8)

## 2022-06-09 ENCOUNTER — Ambulatory Visit: Payer: Self-pay | Admitting: *Deleted

## 2022-06-09 NOTE — Patient Instructions (Addendum)
Visit Information  Thank you for taking time to visit with me today. Please don't hesitate to contact me if I can be of assistance to you.   Following are the goals we discussed today:   Goals Addressed               This Visit's Progress     Patient Stated     Community resources to assist with safe [place], transfers to car [facility]) (pt-stated)   On track     Care Coordination Interventions: Discussed plans with patient for ongoing care management follow up and provided patient with direct contact information for care management team Discussed preventing care giver burnout Active listening with encouragement Follow up on any recent falls/injury Discussed use of hoyer lifts RN CM inquired about bed ladders cost & hoyer lift's medicare coverage                   Decrease falls (THN) (pt-stated)   Not on track     Care Coordination Interventions: Assessed for falls since last encounter Assessed social determinant of health barriers Discussed hoyer lifts and bed ladders RN CM followed up on medicare coverage of hoyer lift and bed ladders Went to knees coming back from church recently as he was tired Sent education via my chart on reducing risks of falls, orthostatic hypotension        Our next appointment is by telephone on 07/10/22 at 1130  Please call the care guide team at [phone] if you need to cancel or reschedule your appointment.   If you are experiencing a Mental Health or [place] or need someone to talk to, please call the Suicide and Crisis Lifeline: 988 call the [place] National Suicide Prevention Lifeline: 765-656-2257 or TTY: (865)096-7218 TTY 360-168-5057) to talk to a trained counselor call 1-800-273-TALK (toll free, 24 hour hotline) call the [facility]: [phone] call 911   Patient verbalizes understanding of instructions and care plan provided today and agrees to view in [place]. Active MyChart status and patient  understanding of how to access instructions and care plan via MyChart confirmed with patient.     The patient has been provided with contact information for the care management team and has been advised to call with any health related questions or concerns.   [place] Lavina Hamman, RN, BSN, [place] Coordinator Office number [phone]

## 2022-06-09 NOTE — Patient Outreach (Signed)
  Care Coordination   Initial Visit Note   06/09/2022 Name: Alex Frazier MRN: [id number] DOB: 02/20/1946  Alex Frazier is a 76 y.o. year old male who sees Pickard, Cammie Mcgee, MD for primary care. I spoke with  Shirline Frees by phone today.  What matters to the patients health and wellness today?  More difficult to get out of bed as he is moving slower No recent weight completed only estimate of 150 Lbs  Falls none in the last few weeks.  Went to his knees recently as he was tired coming back from church - no injury  Prefers not to request a hoyer lift at this time   Wife voice interest in whether or not medicare covers bed ladders   Goals Addressed               This Visit's Progress     Patient Stated     Community resources to assist with safe [place], transfers to car [facility]) (pt-stated)   On track     Care Coordination Interventions: Discussed plans with patient for ongoing care management follow up and provided patient with direct contact information for care management team Discussed preventing care giver burnout Active listening with encouragement Follow up on any recent falls/injury Discussed use of hoyer lifts RN CM inquired about bed ladders cost & hoyer lift's medicare coverage                   Decrease falls (THN) (pt-stated)   Not on track     Care Coordination Interventions: Assessed for falls since last encounter Assessed social determinant of health barriers Discussed hoyer lifts and bed ladders RN CM followed up on medicare coverage of hoyer lift and bed ladders Went to knees coming back from church recently as he was tired Engineer, manufacturing systems education via my chart on reducing risks of falls, orthostatic hypotension        SDOH assessments and interventions completed:  Yes     Care Coordination Interventions Activated:  Yes  Care Coordination Interventions:  Yes, provided   Follow up plan: Follow up call scheduled for 07/10/22 1130    Encounter  Outcome:  Pt. Visit Completed   Queenie Aufiero L. Lavina Hamman, RN, BSN, [place] Coordinator Office number [phone]

## 2022-07-02 DIAGNOSIS — Z23 Encounter for immunization: Secondary | ICD-10-CM | POA: Diagnosis not present

## 2022-07-04 ENCOUNTER — Telehealth: Payer: Self-pay

## 2022-07-04 ENCOUNTER — Other Ambulatory Visit: Payer: Self-pay | Admitting: Family Medicine

## 2022-07-04 MED ORDER — CEPHALEXIN 500 MG PO CAPS: 500.0000 mg | ORAL_CAPSULE | Freq: Three times a day (TID) | ORAL | 0 refills | Status: DC

## 2022-07-04 NOTE — Telephone Encounter (Signed)
POSSIBLE UTI:  Pt's wife, Eleftherios Dudenhoeffer, called and states she has noticed a "stream of mucous" in the patient's urine this morning. No fever, no c/o abdominal pain or unusual back pain. Pt's wife states she doesn't think she can get a urine sample because he wears depends. Mrs. Barlowe asks if an antibiotic can be sent in for patient? Thank you.

## 2022-07-10 ENCOUNTER — Ambulatory Visit: Payer: Self-pay | Admitting: *Deleted

## 2022-07-11 NOTE — Patient Instructions (Addendum)
Visit Information  Thank you for taking time to visit with me today. Please don't hesitate to contact me if I can be of assistance to you.   Following are the goals we discussed today:   Goals Addressed               This Visit's Progress     Patient Stated     manage Urinary tract infections (THN) (pt-stated)   Not on track     Care Coordination Interventions: Assessment of understanding of urinary tract infection diagnosis Education: Basic overview and discussion of urinary tract infections with patient Review of most recent labs: '@RESUFAST'$ (WBC)@ Medications reviewed including antibiotic therapy Reviewed all signs of UTI Assessed hydration,fluid intake, & discussed some possible ways of increasing fluid intake        Our next appointment is by telephone on 08/11/22 at  1030  Please call the care guide team at [phone] if you need to cancel or reschedule your appointment.   If you are experiencing a Mental Health or [place] or need someone to talk to, please call the Suicide and Crisis Lifeline: 988 call the [place] National Suicide Prevention Lifeline: 930 304 7859 or TTY: 209-095-2010 TTY 617-412-9624) to talk to a trained counselor call 1-800-273-TALK (toll free, 24 hour hotline) go to [facility] [address] [place] [phone]) call the [place]: [phone] call 911   Patient verbalizes understanding of instructions and care plan provided today and agrees to view in [place]. Active MyChart status and patient understanding of how to access instructions and care plan via MyChart confirmed with patient.     The patient has been provided with contact information for the care management team and has been advised to call with any health related questions or concerns.   Zhane Bluitt L. Lavina Hamman, RN, BSN, [place] Coordinator Office number [phone]

## 2022-07-11 NOTE — Patient Outreach (Signed)
  Care Coordination   Follow Up Visit Note   07/10/2022 Name: Alex Frazier MRN: [id number] DOB: May 23, 1946  Alex Frazier is a 76 y.o. year old male who sees Pickard, Cammie Mcgee, MD for primary care. I spoke with  wife of Alex Frazier by phone today.  What matters to the patients health and wellness today?  Doing okay urinary tract infection (UTI) noted darker urine, thick clear discharge in depends, pain of abdomen wife called MD and obtained an antibiotic     Goals Addressed               This Visit's Progress     Patient Stated     manage Urinary tract infections (THN) (pt-stated)   Not on track     Care Coordination Interventions: Assessment of understanding of urinary tract infection diagnosis Education: Basic overview and discussion of urinary tract infections with patient Review of most recent labs: '@RESUFAST'$ (WBC)@ Medications reviewed including antibiotic therapy Reviewed all signs of UTI Assessed hydration,fluid intake, & discussed some possible ways of increasing fluid intake        SDOH assessments and interventions completed:  No     Care Coordination Interventions Activated:  Yes  Care Coordination Interventions:  Yes, provided   Follow up plan: Follow up call scheduled for 08/11/22    Encounter Outcome:  Pt. Visit Completed  Malasha Kleppe L. Lavina Hamman, RN, BSN, [place] Coordinator Office number [phone]

## 2022-07-17 ENCOUNTER — Other Ambulatory Visit: Payer: Self-pay | Admitting: Family Medicine

## 2022-07-17 NOTE — Telephone Encounter (Signed)
Requested medication (s) are due for refill today: yes  Requested medication (s) are on the active medication list: yes  Last refill:  05/30/21  Future visit scheduled: yes  Notes to clinic:  Unable to refill per protocol, cannot delegate.      Requested Prescriptions  Pending Prescriptions Disp Refills   midodrine (PROAMATINE) 10 MG tablet [Pharmacy Med Name: MIDODRINE '10MG'$  TABLETS] 270 tablet 3    Sig: TAKE 1 TABLET BY MOUTH 3 TIMES DAILY. GENERIC EQUIVALENT FOR PROAMATINE     Not Delegated - Cardiovascular: Midodrine Failed - 07/17/2022  8:23 AM      Failed - This refill cannot be delegated      Failed - Last BP in normal range    BP Readings from Last 1 Encounters:  06/06/22 (!) 140/72         Passed - Cr in normal range and within 360 days    Creat  Date Value Ref Range Status  06/06/2022 0.70 0.70 - 1.28 mg/dL Final         Passed - ALT in normal range and within 360 days    ALT  Date Value Ref Range Status  06/06/2022 13 9 - 46 U/L Final         Passed - AST in normal range and within 360 days    AST  Date Value Ref Range Status  06/06/2022 15 10 - 35 U/L Final         Passed - Valid encounter within last 12 months    Recent Outpatient Visits           7 months ago Parkinson's disease ([place])   [place] Pickard, Cammie Mcgee, MD   10 months ago Primary parkinsonism ([place])   [place] Susy Frizzle, MD   1 year ago Need for immunization against influenza   [place] Susy Frizzle, MD   1 year ago Primary parkinsonism [facility])   [place] Susy Frizzle, MD   2 years ago Orthostatic hypotension   [place], Cammie Mcgee, MD       Future Appointments             In 4 months Pickard, Cammie Mcgee, MD [place]

## 2022-07-29 ENCOUNTER — Other Ambulatory Visit: Payer: Self-pay | Admitting: Family Medicine

## 2022-07-29 DIAGNOSIS — E785 Hyperlipidemia, unspecified: Secondary | ICD-10-CM

## 2022-07-29 NOTE — Telephone Encounter (Signed)
Requested medications are due for refill today.  yes  Requested medications are on the active medications list.  yes  Last refill. 02/05/2022 #90 1 rf  Future visit scheduled.   yes  Notes to clinic.  Labs are expired.    Requested Prescriptions  Pending Prescriptions Disp Refills   pravastatin (PRAVACHOL) 20 MG tablet [Pharmacy Med Name: PRAVASTATIN '20MG'$  TABLETS] 90 tablet 1    Sig: TAKE 1 TABLET BY MOUTH DAILY AT 6PM. GENERIC EQUIVALENT FOR PRAVACHOL     Cardiovascular:  Antilipid - Statins Failed - 07/29/2022 12:45 PM      Failed - Lipid Panel in normal range within the last 12 months    Cholesterol  Date Value Ref Range Status  04/25/2019 124 <200 mg/dL Final   LDL Cholesterol (Calc)  Date Value Ref Range Status  04/25/2019 67 mg/dL (calc) Final    Comment:    Reference range: <100 . Desirable range <100 mg/dL for primary prevention;   <70 mg/dL for patients with CHD or diabetic patients  with > or = 2 CHD risk factors. Marland Kitchen LDL-C is now calculated using the Martin-Hopkins  calculation, which is a validated novel method providing  better accuracy than the Friedewald equation in the  estimation of LDL-C.  Cresenciano Genre et al. Annamaria Helling. 9563;875(64): 2061-2068  (http://education.Quest[employer].com/faq/FAQ164)    HDL  Date Value Ref Range Status  04/25/2019 41 > OR = 40 mg/dL Final   Triglycerides  Date Value Ref Range Status  04/25/2019 76 <150 mg/dL Final         Passed - Patient is not pregnant      Passed - Valid encounter within last 12 months    Recent Outpatient Visits           8 months ago Parkinson's disease ([place])   [place] Pickard, Cammie Mcgee, MD   10 months ago Primary parkinsonism [facility])   [place] Susy Frizzle, MD   1 year ago Need for immunization against influenza   [place] Susy Frizzle, MD   1 year ago Primary parkinsonism [facility])   [place] Susy Frizzle, MD   2 years ago Orthostatic hypotension   [place], Cammie Mcgee, MD       Future Appointments             In 4 months Pickard, Cammie Mcgee, MD [place]

## 2022-08-05 ENCOUNTER — Encounter: Payer: Self-pay | Admitting: Family Medicine

## 2022-08-06 ENCOUNTER — Other Ambulatory Visit: Payer: Self-pay

## 2022-08-06 DIAGNOSIS — E785 Hyperlipidemia, unspecified: Secondary | ICD-10-CM

## 2022-08-06 MED ORDER — PRAVASTATIN SODIUM 20 MG PO TABS: ORAL_TABLET | ORAL | 3 refills | Status: DC

## 2022-08-09 ENCOUNTER — Other Ambulatory Visit: Payer: Self-pay | Admitting: Family Medicine

## 2022-08-11 ENCOUNTER — Ambulatory Visit: Payer: Self-pay | Admitting: *Deleted

## 2022-08-11 NOTE — Patient Outreach (Signed)
  Care Coordination   Follow Up Visit Note   08/11/2022 Name: Alex Frazier MRN: [id number] DOB: 1946/07/02  Alex Frazier is a 76 y.o. year old male who sees Pickard, Cammie Mcgee, MD for primary care. I spoke with  Shirline Frees by phone today.  What matters to the patients health and wellness today?  More lethargic  More tasking to get out of bed Got bed around 2230-0730/0800 1230 -1300  Hallucinations when look out patio door Minimal snoring but does sleep with his mouth open, talks in sleep occasionally Denies gasping for air, no blue fingertips,  Stopped melationin,  Discussed respite programs, sitters list [employer] monitoring    Goals Addressed   None     SDOH assessments and interventions completed:  No     Care Coordination Interventions:  Yes, provided   Follow up plan: Follow up call scheduled for 09/23/22    Encounter Outcome:  Pt. Visit Completed   Anyia Gierke L. Lavina Hamman, RN, BSN, [place] Coordinator Office number [phone]

## 2022-08-11 NOTE — Patient Instructions (Signed)
Visit Information  Thank you for taking time to visit with me today. Please don't hesitate to contact me if I can be of assistance to you.   Following are the goals we discussed today:   Goals Addressed               This Visit's Progress     Patient Stated     manage sleep (THN) (pt-stated)        nine hours each night may be a sign of a sleep disorder, mental health disorder, or other health issue. Sleeping too much can negatively impact your immune system, mental health, heart health, and potentially lead to chronic diseases.Nov         Our next appointment is by telephone on 09/23/22 at 0900  Please call the care guide team at [phone] if you need to cancel or reschedule your appointment.   If you are experiencing a Mental Health or [place] or need someone to talk to, please call the Suicide and Crisis Lifeline: 988 call the [place] National Suicide Prevention Lifeline: 585-613-7845 or TTY: 248-237-9720 TTY 713 369 1374) to talk to a trained counselor call 1-800-273-TALK (toll free, 24 hour hotline) go to [facility] [address] [place] [phone]) call 911   Patient verbalizes understanding of instructions and care plan provided today and agrees to view in [place]. Active MyChart status and patient understanding of how to access instructions and care plan via MyChart confirmed with patient.     The patient has been provided with contact information for the care management team and has been advised to call with any health related questions or concerns.   Mandie Crabbe L. Lavina Hamman, RN, BSN, [place] Coordinator Office number [phone]

## 2022-08-11 NOTE — Telephone Encounter (Signed)
Requested Prescriptions  Pending Prescriptions Disp Refills   DULoxetine (CYMBALTA) 60 MG capsule [Pharmacy Med Name: DULOXETINE DR 60MG CAPSULES] 90 capsule 2    Sig: TAKE 1 CAPSULE(60 MG) BY MOUTH DAILY     Psychiatry: Antidepressants - SNRI - duloxetine Failed - 08/09/2022 10:14 AM      Failed - Last BP in normal range    BP Readings from Last 1 Encounters:  06/06/22 (!) 140/72         Failed - Valid encounter within last 6 months    Recent Outpatient Visits           8 months ago Parkinson's disease ([place])   [place] Pickard, Cammie Mcgee, MD   10 months ago Primary parkinsonism [facility])   [place] Pickard, Cammie Mcgee, MD   1 year ago Need for immunization against influenza   [place] Pickard, Cammie Mcgee, MD   1 year ago Primary parkinsonism [facility])   [place] Susy Frizzle, MD   2 years ago Orthostatic hypotension   [place], Warren T, MD       Future Appointments             In 3 months Pickard, Cammie Mcgee, MD [place], [facility]            Passed - Cr in normal range and within 360 days    Creat  Date Value Ref Range Status  06/06/2022 0.70 0.70 - 1.28 mg/dL Final         Passed - eGFR is 30 or above and within 360 days    GFR, Est African American  Date Value Ref Range Status  11/27/2020 99 > OR = 60 mL/min/1.41m Final   GFR, Est Non African American  Date Value Ref Range Status  11/27/2020 85 > OR = 60 mL/min/1.748mFinal   eGFR  Date Value Ref Range Status  06/06/2022 96 > OR = 60 mL/min/1.7346minal         Passed - Completed PHQ-2 or PHQ-9 in the last 360 days

## 2022-08-14 ENCOUNTER — Other Ambulatory Visit: Payer: Self-pay

## 2022-08-14 DIAGNOSIS — G20A1 Parkinson's disease without dyskinesia, without mention of fluctuations: Secondary | ICD-10-CM

## 2022-08-14 DIAGNOSIS — G629 Polyneuropathy, unspecified: Secondary | ICD-10-CM

## 2022-08-14 DIAGNOSIS — R296 Repeated falls: Secondary | ICD-10-CM

## 2022-08-14 DIAGNOSIS — S32000D Wedge compression fracture of unspecified lumbar vertebra, subsequent encounter for fracture with routine healing: Secondary | ICD-10-CM

## 2022-08-19 ENCOUNTER — Ambulatory Visit (INDEPENDENT_AMBULATORY_CARE_PROVIDER_SITE_OTHER): Payer: Medicare Other

## 2022-08-19 VITALS — Ht 66.0 in | Wt 150.0 lb

## 2022-08-19 DIAGNOSIS — Z0000 Encounter for general adult medical examination without abnormal findings: Secondary | ICD-10-CM | POA: Diagnosis not present

## 2022-08-19 NOTE — Patient Instructions (Signed)
Mr. Senner , Thank you for taking time to come for your Medicare Wellness Visit. I appreciate your ongoing commitment to your health goals. Please review the following plan we discussed and let me know if I can assist you in the future.   These are the goals we discussed:  Goals       Community resources to assist with safe [place], transfers to car [facility]) (pt-stated)      Care Coordination Interventions: Discussed plans with patient for ongoing care management follow up and provided patient with direct contact information for care management team Discussed preventing care giver burnout Active listening with encouragement Follow up on any recent falls/injury Discussed use of hoyer lifts RN CM inquired about bed ladders cost & hoyer lift's medicare coverage      Decrease falls ([facility]) (pt-stated)      Care Coordination Interventions: Assessed for falls since last encounter Assessed patients knowledge of fall risk prevention secondary to previously provided education Advised patient to discuss falls with provider Screening for signs and symptoms of depression related to chronic disease state  Assessed social determinant of health barriers       Decrease falls ([facility]) (pt-stated)      Care Coordination Interventions: Assessed for falls since last encounter Assessed social determinant of health barriers Discussed hoyer lifts and bed ladders RN CM followed up on medicare coverage of hoyer lift and bed ladders Went to knees coming back from church recently as he was tired Engineer, manufacturing systems education via my chart on reducing risks of falls, orthostatic hypotension      manage sleep ([facility]) (pt-stated)      nine hours each night may be a sign of a sleep disorder, mental health disorder, or other health issue. Sleeping too much can negatively impact your immune system, mental health, heart health, and potentially lead to chronic diseases.Nov       manage Urinary tract infections ([facility]) (pt-stated)      Care  Coordination Interventions: Assessment of understanding of urinary tract infection diagnosis Education: Basic overview and discussion of urinary tract infections with patient Review of most recent labs: '@RESUFAST'$ (WBC)@ Medications reviewed including antibiotic therapy Reviewed all signs of UTI Assessed hydration,fluid intake, & discussed some possible ways of increasing fluid intake        This is a list of the screening recommended for you and due dates:  Health Maintenance  Topic Date Due   Zoster (Shingles) Vaccine (1 of 2) Never done   COVID-19 Vaccine (3 - Pfizer risk series) 12/06/2019   DTaP/Tdap/Td vaccine (3 - Tdap) 04/28/2021   Medicare Annual Wellness Visit  08/20/2023   Pneumonia Vaccine  Completed   Flu Shot  Completed   Hepatitis C Screening: USPSTF Recommendation to screen - Ages 18-79 yo.  Completed   HPV Vaccine  Aged Out   Colon Cancer Screening  Discontinued    Advanced directives: Please bring a copy of your health care power of attorney and living will to the office to be added to your chart at your convenience.   Conditions/risks identified: Aim for 30 minutes of exercise or brisk walking, 6-8 glasses of water, and 5 servings of fruits and vegetables each day.   Next appointment: Follow up in one year for your annual wellness visit.   Preventive Care 56 Years and Older, Male  Preventive care refers to lifestyle choices and visits with your health care provider that can promote health and wellness. What does preventive care include? A yearly physical exam. This  is also called an annual well check. Dental exams once or twice a year. Routine eye exams. Ask your health care provider how often you should have your eyes checked. Personal lifestyle choices, including: Daily care of your teeth and gums. Regular physical activity. Eating a healthy diet. Avoiding tobacco and drug use. Limiting alcohol use. Practicing safe sex. Taking low doses of aspirin every  day. Taking vitamin and mineral supplements as recommended by your health care provider. What happens during an annual well check? The services and screenings done by your health care provider during your annual well check will depend on your age, overall health, lifestyle risk factors, and family history of disease. Counseling  Your health care provider may ask you questions about your: Alcohol use. Tobacco use. Drug use. Emotional well-being. Home and relationship well-being. Sexual activity. Eating habits. History of falls. Memory and ability to understand (cognition). Work and work Statistician. Screening  You may have the following tests or measurements: Height, weight, and BMI. Blood pressure. Lipid and cholesterol levels. These may be checked every 5 years, or more frequently if you are over 97 years old. Skin check. Lung cancer screening. You may have this screening every year starting at age 78 if you have a 30-pack-year history of smoking and currently smoke or have quit within the past 15 years. Fecal occult blood test (FOBT) of the stool. You may have this test every year starting at age 62. Flexible sigmoidoscopy or colonoscopy. You may have a sigmoidoscopy every 5 years or a colonoscopy every 10 years starting at age 69. Prostate cancer screening. Recommendations will vary depending on your family history and other risks. Hepatitis C blood test. Hepatitis B blood test. Sexually transmitted disease (STD) testing. Diabetes screening. This is done by checking your blood sugar (glucose) after you have not eaten for a while (fasting). You may have this done every 1-3 years. Abdominal aortic aneurysm (AAA) screening. You may need this if you are a current or former smoker. Osteoporosis. You may be screened starting at age 63 if you are at high risk. Talk with your health care provider about your test results, treatment options, and if necessary, the need for more  tests. Vaccines  Your health care provider may recommend certain vaccines, such as: Influenza vaccine. This is recommended every year. Tetanus, diphtheria, and acellular pertussis (Tdap, Td) vaccine. You may need a Td booster every 10 years. Zoster vaccine. You may need this after age 42. Pneumococcal 13-valent conjugate (PCV13) vaccine. One dose is recommended after age 21. Pneumococcal polysaccharide (PPSV23) vaccine. One dose is recommended after age 70. Talk to your health care provider about which screenings and vaccines you need and how often you need them. This information is not intended to replace advice given to you by your health care provider. Make sure you discuss any questions you have with your health care provider. Document Released: 09/21/2015 Document Revised: 05/14/2016 Document Reviewed: 06/26/2015 Elsevier Interactive Patient Education  2017 [employer] Prevention in the Home Falls can cause injuries. They can happen to people of all ages. There are many things you can do to make your home safe and to help prevent falls. What can I do on the outside of my home? Regularly fix the edges of walkways and driveways and fix any cracks. Remove anything that might make you trip as you walk through a door, such as a raised step or threshold. Trim any bushes or trees on the path to your home. Use  bright outdoor lighting. Clear any walking paths of anything that might make someone trip, such as rocks or tools. Regularly check to see if handrails are loose or broken. Make sure that both sides of any steps have handrails. Any raised decks and porches should have guardrails on the edges. Have any leaves, snow, or ice cleared regularly. Use sand or salt on walking paths during winter. Clean up any spills in your garage right away. This includes oil or grease spills. What can I do in the bathroom? Use night lights. Install grab bars by the toilet and in the tub and shower.  Do not use towel bars as grab bars. Use non-skid mats or decals in the tub or shower. If you need to sit down in the shower, use a plastic, non-slip stool. Keep the floor dry. Clean up any water that spills on the floor as soon as it happens. Remove soap buildup in the tub or shower regularly. Attach bath mats securely with double-sided non-slip rug tape. Do not have throw rugs and other things on the floor that can make you trip. What can I do in the bedroom? Use night lights. Make sure that you have a light by your bed that is easy to reach. Do not use any sheets or blankets that are too big for your bed. They should not hang down onto the floor. Have a firm chair that has side arms. You can use this for support while you get dressed. Do not have throw rugs and other things on the floor that can make you trip. What can I do in the kitchen? Clean up any spills right away. Avoid walking on wet floors. Keep items that you use a lot in easy-to-reach places. If you need to reach something above you, use a strong step stool that has a grab bar. Keep electrical cords out of the way. Do not use floor polish or wax that makes floors slippery. If you must use wax, use non-skid floor wax. Do not have throw rugs and other things on the floor that can make you trip. What can I do with my stairs? Do not leave any items on the stairs. Make sure that there are handrails on both sides of the stairs and use them. Fix handrails that are broken or loose. Make sure that handrails are as long as the stairways. Check any carpeting to make sure that it is firmly attached to the stairs. Fix any carpet that is loose or worn. Avoid having throw rugs at the top or bottom of the stairs. If you do have throw rugs, attach them to the floor with carpet tape. Make sure that you have a light switch at the top of the stairs and the bottom of the stairs. If you do not have them, ask someone to add them for you. What else  can I do to help prevent falls? Wear shoes that: Do not have high heels. Have rubber bottoms. Are comfortable and fit you well. Are closed at the toe. Do not wear sandals. If you use a stepladder: Make sure that it is fully opened. Do not climb a closed stepladder. Make sure that both sides of the stepladder are locked into place. Ask someone to hold it for you, if possible. Clearly mark and make sure that you can see: Any grab bars or handrails. First and last steps. Where the edge of each step is. Use tools that help you move around (mobility aids) if they are needed.  These include: Canes. Walkers. Scooters. Crutches. Turn on the lights when you go into a dark area. Replace any light bulbs as soon as they burn out. Set up your furniture so you have a clear path. Avoid moving your furniture around. If any of your floors are uneven, fix them. If there are any pets around you, be aware of where they are. Review your medicines with your doctor. Some medicines can make you feel dizzy. This can increase your chance of falling. Ask your doctor what other things that you can do to help prevent falls. This information is not intended to replace advice given to you by your health care provider. Make sure you discuss any questions you have with your health care provider. Document Released: 06/21/2009 Document Revised: 01/31/2016 Document Reviewed: 09/29/2014 Elsevier Interactive Patient Education  2017 [employer].

## 2022-08-19 NOTE — Progress Notes (Signed)
Subjective:   Alex Frazier is a 76 y.o. male who presents for Medicare Annual/Subsequent preventive examination.  I connected with  Shirline Frees on 08/19/22 by a audio enabled telemedicine application and verified that I am speaking with the correct person using two identifiers.  Patient Location: Home  Provider Location: Office/Clinic  I discussed the limitations of evaluation and management by telemedicine. The patient expressed understanding and agreed to proceed.  Review of Systems     Cardiac Risk Factors include: advanced age (>59mn, >>77women);male gender;dyslipidemia     Objective:    Today's Vitals   08/19/22 1645  Weight: 150 lb (68 kg)  Height: '5\' 6"'$  (1.676 m)   Body mass index is 24.21 kg/m.     08/19/2022    4:50 PM 05/09/2022    4:46 PM 01/10/2022   10:12 AM 03/08/2021    3:02 PM 03/17/2020    8:00 PM 10/16/2019   12:18 PM 02/12/2017    8:47 AM  Advanced Directives  Does Patient Have a Medical Advance Directive? Yes No Yes Yes Yes No Yes  Type of Advance Directive Living will  [place] will  [place] will  Does patient want to make changes to medical advance directive? No - Patient declined  No - Guardian declined No - Patient declined No - Guardian declined    Copy of [place] Chart?    No - copy requested No - copy requested    Would patient like information on creating a medical advance directive?  No - Guardian declined         Current Medications (verified) Outpatient Encounter Medications as of 08/19/2022  Medication Sig   acetaminophen (TYLENOL) 500 MG tablet Take 2 tablets (1,000 mg total) by mouth 3 (three) times daily.   carbidopa-levodopa (SINEMET IR) 25-100 MG tablet Take 2 tablets by mouth See admin instructions. Take 2 tablets by mouth three times daily- 7 AM, 1 PM, and 7 PM    cholecalciferol (VITAMIN D3) 25 MCG (1000 UNIT) tablet Take 2,000 Units by mouth in the morning.    Cyanocobalamin (VITAMIN B 12 PO) Take 1 tablet by mouth in the morning.    DULoxetine (CYMBALTA) 60 MG capsule TAKE 1 CAPSULE(60 MG) BY MOUTH DAILY   fludrocortisone (FLORINEF) 0.1 MG tablet TAKE TWO TABLETS BY MOUTH DAILY   midodrine (PROAMATINE) 10 MG tablet TAKE 1 TABLET BY MOUTH 3 TIMES DAILY. GENERIC EQUIVALENT FOR PROAMATINE   polyethylene glycol powder (GLYCOLAX/MIRALAX) 17 GM/SCOOP powder Take 17 g by mouth See admin instructions. Mix 17 grams of powder into 4-8 ounces of prune juice and drink once a day   pravastatin (PRAVACHOL) 20 MG tablet Take 1 tablet by mouth daily at 6 PM.   senna-docusate (SENOKOT-S) 8.6-50 MG tablet Take 2 tablets by mouth at bedtime as needed for mild constipation.   [DISCONTINUED] cephALEXin (KEFLEX) 500 MG capsule Take 1 capsule (500 mg total) by mouth 3 (three) times daily. (Patient not taking: Reported on 08/11/2022)   [DISCONTINUED] donepezil (ARICEPT) 5 MG tablet Take 5 mg by mouth at bedtime.  (Patient not taking: Reported on 08/11/2022)   [DISCONTINUED] donepezil (ARICEPT) 5 MG tablet Take by mouth. (Patient not taking: Reported on 08/11/2022)   [DISCONTINUED] hydrocortisone 2.5 % cream Apply topically 2 (two) times daily. (Patient not taking: Reported on 06/06/2022)   [DISCONTINUED] MELATONIN ER PO Take 15 mg by  mouth. (Patient not taking: Reported on 08/11/2022)   [DISCONTINUED] Menthol, Topical Analgesic, (BIOFREEZE) 4 % GEL Apply 1 application topically See admin instructions. Apply topically to painful sites as directed by MD (Patient not taking: Reported on 06/06/2022)   No facility-administered encounter medications on file as of 08/19/2022.    Allergies (verified) Tape, Hydrocodone-acetaminophen, Indomethacin, Lyrica [pregabalin], and Quetiapine   History: Past Medical History:  Diagnosis Date   B12 deficiency    Borderline   Bulging discs     Degenerative disc disease, cervical    Gastritis    Hyperlipidemia    Neuropathy    Parkinson's disease    Parkinsonian syndrome    Peripheral neuropathy    TIA (transient ischemic attack)    TIA symptoms from hypotension   Past Surgical History:  Procedure Laterality Date   APPENDECTOMY     BACK SURGERY     Family History  Problem Relation Age of Onset   Parkinson's disease Mother    Parkinson's disease Father    Diabetes Sister    Heart disease Brother    Social History   Socioeconomic History   Marital status: Married    Spouse name: Not on file   Number of children: 3   Years of education: Not on file   Highest education level: High school graduate  Occupational History   Not on file  Tobacco Use   Smoking status: Never   Smokeless tobacco: Never  Vaping Use   Vaping Use: Never used  Substance and Sexual Activity   Alcohol use: No   Drug use: No   Sexual activity: Not on file  Other Topics Concern   Not on file  Social History Narrative   Lives at home with his wife   Right handed   Caffeine: none   Social Determinants of Health   Financial Resource Strain: Low Risk  (08/19/2022)   Overall Financial Resource Strain (CARDIA)    Difficulty of Paying Living Expenses: Not hard at all  Food Insecurity: No Food Insecurity (08/19/2022)   Hunger Vital Sign    Worried About Running Out of Food in the Last Year: Never true    Ran Out of Food in the Last Year: Never true  Transportation Needs: No Transportation Needs (08/19/2022)   PRAPARE - Hydrologist (Medical): No    Lack of Transportation (Non-Medical): No  Physical Activity: Inactive (08/19/2022)   Exercise Vital Sign    Days of Exercise per Week: 0 days    Minutes of Exercise per Session: 0 min  Stress: No Stress Concern Present (08/19/2022)   [place]    Feeling of Stress : Not at all  Social  Connections: [place] (08/19/2022)   Social Connection and Isolation Panel [NHANES]    Frequency of Communication with Friends and Family: More than three times a week    Frequency of Social Gatherings with Friends and Family: Three times a week    Attends Religious Services: 1 to 4 times per year    Active Member of Clubs or Organizations: Yes    Attends Archivist Meetings: 1 to 4 times per year    Marital Status: Married    Tobacco Counseling Counseling given: Not Answered   Clinical Intake:  Pre-visit preparation completed: Yes  Pain : No/denies pain  Diabetes: No  How often do you need to have someone help you when you read instructions, pamphlets, or  other written materials from your doctor or pharmacy?: 1 - Never  Diabetic?No   Interpreter Needed?: No  Information entered by :: Denman George LPN   Activities of Daily Living    08/19/2022    4:51 PM 05/09/2022    4:46 PM  In your present state of health, do you have any difficulty performing the following activities:  Hearing? 1 1  Vision? 0 0  Difficulty concentrating or making decisions? 1 1  Walking or climbing stairs? 1 1  Dressing or bathing? 1 1  Doing errands, shopping? 1 1  Preparing Food and eating ? Y Y  Using the Toilet? N Y  In the past six months, have you accidently leaked urine? N Y  Do you have problems with loss of bowel control? N Y  Managing your Medications? Y Y  Managing your Finances? Tempie Donning  Housekeeping or managing your Housekeeping? Tempie Donning    Patient Care Team: Susy Frizzle, MD as PCP - General (Family Medicine) Nahser, Wonda Cheng, MD as PCP - Cardiology (Cardiology) Barbaraann Faster, RN as [facility] Management Thenganatt, Audrea Muscat, MD as Referring Physician (Psychiatry)  Indicate any recent Medical Services you may have received from other than [facility] providers in the past year (date may be approximate).     Assessment:   This is a  routine wellness examination for [place].  Hearing/Vision screen Hearing Screening - Comments:: Hard of hearing; bilateral hearing aids   Vision Screening - Comments:: Wears rx glasses - up to date with routine eye exams with Dr. Satira Sark   Dietary issues and exercise activities discussed: Current Exercise Habits: The patient does not participate in regular exercise at present   Goals Addressed               This Visit's Progress     Decrease falls (THN) (pt-stated)   Not on track     Care Coordination Interventions: Assessed for falls since last encounter Assessed social determinant of health barriers Discussed hoyer lifts and bed ladders RN CM followed up on medicare coverage of hoyer lift and bed ladders Went to knees coming back from church recently as he was tired Sent education via my chart on reducing risks of falls, orthostatic hypotension      Depression Screen    08/19/2022    4:53 PM 05/09/2022   11:23 AM 01/10/2022   10:06 AM 03/08/2021    3:04 PM 09/23/2018    8:51 AM 08/25/2017    9:58 AM 01/31/2016    7:54 AM  PHQ 2/9 Scores  PHQ - 2 Score 0 1  0 0 0   Exception Documentation   Other- indicate reason in comment box    Medical reason  Not completed   unable to assess        Fall Risk    08/19/2022    4:47 PM 05/09/2022    4:48 PM 01/10/2022   10:06 AM 03/08/2021    3:03 PM 09/23/2018    8:51 AM  Fall Risk   Falls in the past year? '1 1 1 1 1  '$ Number falls in past yr: 1 1 0 1 1  Injury with Fall? '1 1  1   '$ Risk for fall due to : History of fall(s);Impaired balance/gait;Impaired mobility History of fall(s);Impaired balance/gait;Impaired mobility;Mental status change  Impaired balance/gait;History of fall(s);Medication side effect;Other (Comment)   Risk for fall due to: Comment    Hypotension   Follow up Falls evaluation  completed;Education provided;Falls prevention discussed Falls evaluation completed  Falls evaluation completed;Falls prevention discussed Falls  evaluation completed    FALL RISK PREVENTION PERTAINING TO THE HOME:  Any stairs in or around the home? Yes  If so, are there any without handrails? No  Home free of loose throw rugs in walkways, pet beds, electrical cords, etc? Yes  Adequate lighting in your home to reduce risk of falls? Yes   ASSISTIVE DEVICES UTILIZED TO PREVENT FALLS:  Life alert? No  Use of a cane, walker or w/c? Yes  Grab bars in the bathroom? Yes  Shower chair or bench in shower? No  Elevated toilet seat or a handicapped toilet? Yes   TIMED UP AND GO:  Was the test performed? No .  Length of time to ambulate 10 feet: telephonic visit  Cognitive Function:        08/19/2022    4:52 PM  6CIT Screen  What Year? 4 points  What month? 0 points  What time? 0 points  Count back from 20 0 points  Months in reverse 4 points  Repeat phrase 6 points  Total Score 14 points    Immunizations Immunization History  Administered Date(s) Administered   Fluad Quad(high Dose 65+) 05/31/2019, 05/30/2021, 06/06/2022   Influenza Split 06/02/2012   Influenza Whole 07/09/2009   Influenza, High Dose Seasonal PF 06/25/2017, 06/09/2018   Influenza,inj,Quad PF,6+ Mos 06/16/2013, 06/29/2014, 06/28/2015, 06/26/2016   PFIZER(Purple Top)SARS-COV-2 Vaccination 10/15/2019, 11/08/2019   Pneumococcal Conjugate-13 10/18/2013   Pneumococcal Polysaccharide-23 07/15/2011   Td 09/09/2003, 04/29/2011   Zoster, Live 01/21/2011    TDAP status: Due, Education has been provided regarding the importance of this vaccine. Advised may receive this vaccine at local pharmacy or Health Dept. Aware to provide a copy of the vaccination record if obtained from local pharmacy or Health Dept. Verbalized acceptance and understanding.  Flu Vaccine status: Up to date  Pneumococcal vaccine status: Up to date  Covid-19 vaccine status: Information provided on how to obtain vaccines.   Qualifies for Shingles Vaccine? Yes   Zostavax completed No    Shingrix Completed?: No.    Education has been provided regarding the importance of this vaccine. Patient has been advised to call insurance company to determine out of pocket expense if they have not yet received this vaccine. Advised may also receive vaccine at local pharmacy or Health Dept. Verbalized acceptance and understanding.  Screening Tests Health Maintenance  Topic Date Due   Zoster Vaccines- Shingrix (1 of 2) Never done   COVID-19 Vaccine (3 - Pfizer risk series) 12/06/2019   DTaP/Tdap/Td (3 - Tdap) 04/28/2021   Medicare Annual Wellness (AWV)  08/20/2023   Pneumonia Vaccine 37+ Years old  Completed   INFLUENZA VACCINE  Completed   Hepatitis C Screening  Completed   HPV VACCINES  Aged Out   COLONOSCOPY (Pts 45-1yr Insurance coverage will need to be confirmed)  Discontinued    Health Maintenance  Health Maintenance Due  Topic Date Due   Zoster Vaccines- Shingrix (1 of 2) Never done   COVID-19 Vaccine (3 - Pfizer risk series) 12/06/2019   DTaP/Tdap/Td (3 - Tdap) 04/28/2021    Colorectal cancer screening: No longer required.   Lung Cancer Screening: (Low Dose CT Chest recommended if Age 76-80years, 30 pack-year currently smoking OR have quit w/in 15years.) does not qualify.   Lung Cancer Screening Referral: n/a   Additional Screening:  Hepatitis C Screening: does qualify; Completed 09/08/16  Vision Screening: Recommended annual ophthalmology exams  for early detection of glaucoma and other disorders of the eye. Is the patient up to date with their annual eye exam?  Yes  Who is the provider or what is the name of the office in which the patient attends annual eye exams? Dr. Satira Sark If pt is not established with a provider, would they like to be referred to a provider to establish care? No .   Dental Screening: Recommended annual dental exams for proper oral hygiene  Community Resource Referral / Chronic Care Management: CRR required this visit?  No   CCM required  this visit?  No      Plan:     I have personally reviewed and noted the following in the patient's chart:   Medical and social history Use of alcohol, tobacco or illicit drugs  Current medications and supplements including opioid prescriptions. Patient is not currently taking opioid prescriptions. Functional ability and status Nutritional status Physical activity Advanced directives List of other physicians Hospitalizations, surgeries, and ER visits in previous 12 months Vitals Screenings to include cognitive, depression, and falls Referrals and appointments  In addition, I have reviewed and discussed with patient certain preventive protocols, quality metrics, and best practice recommendations. A written personalized care plan for preventive services as well as general preventive health recommendations were provided to patient.     Vanetta Mulders, [place]   02/72/5366   Due to this being a virtual visit, the after visit summary with patients personalized plan was offered to patient via mail or my-chart.  Per request, patient was mailed a copy of AVS.  Nurse Notes: No concerns

## 2022-09-23 ENCOUNTER — Ambulatory Visit: Payer: Self-pay

## 2022-09-23 NOTE — Patient Outreach (Signed)
  Care Coordination   Follow Up Visit Note   09/23/2022 Name: Alex Frazier MRN: [id number] DOB: 1946/02/25  Alex Frazier is a 77 y.o. year old male who sees Pickard, Cammie Mcgee, MD for primary care. I  spoke with wife Alex Frazier DPR  What matters to the patients health and wellness today?  Pt is doing about the same with no new major issues. Wife states that pt is sleeping better.  States they got a  bed ladder but  it has not helped as pt does not have enough arm strength to use and he gets frustrated.  States he has fallen a few times but with no injury.  States he is eating good and she it trying to give him enough fluids but he does not like water.  States he does get chokes sometimes   Goals Addressed               This Visit's Progress     Care Coordination Activities-Parkinsons disease        Care Coordination Interventions: Evaluation of current treatment plan related to Parkinson's disease and patient's adherence to plan as established by provider Provided education to patient re: Parkinson's disease Reviewed to provide diet with more protein and to avoid foods and liquids that can cause choking        Decrease falls (THN) (pt-stated)        Care Coordination Interventions: Assessed for falls since last encounter Reinforced fall precautions and home safety Obtained bed ladder but does not find beneficial        SDOH assessments and interventions completed:  No     Care Coordination Interventions:  Yes, provided   Follow up plan: Follow up call scheduled for 11/11/22    Encounter Outcome:  Pt. Visit Completed  Alex Garter RN, [facility], [place] Management [phone]

## 2022-09-23 NOTE — Patient Instructions (Signed)
Visit Information  Thank you for taking time to visit with me today. Please don't hesitate to contact me if I can be of assistance to you.   Following are the goals we discussed today:   Goals Addressed               This Visit's Progress     Care Coordination Activities-Parkinsons disease        Care Coordination Interventions: Evaluation of current treatment plan related to Parkinson's disease and patient's adherence to plan as established by provider Provided education to patient re: Parkinson's disease Reviewed to provide diet with more protein and to avoid foods and liquids that can cause choking        Decrease falls (THN) (pt-stated)        Care Coordination Interventions: Assessed for falls since last encounter Reinforced fall precautions and home safety Obtained bed ladder but does not find beneficial        Our next appointment is by telephone on 11/11/22 at 9 AM  Please call the care guide team at [phone] if you need to cancel or reschedule your appointment.   If you are experiencing a Mental Health or [place] or need someone to talk to, please call the Suicide and Crisis Lifeline: 988 call the [place] National Suicide Prevention Lifeline: (314)157-6416 or TTY: 641-288-2134 TTY (478)275-3034) to talk to a trained counselor call 1-800-273-TALK (toll free, 24 hour hotline) call 911   Patient verbalizes understanding of instructions and care plan provided today and agrees to view in [place]. Active MyChart status and patient understanding of how to access instructions and care plan via MyChart confirmed with patient.     Telephone follow up appointment with care management team member scheduled for:11/11/22  Peter Garter RN, [facility], CDE Care Management Coordinator [place] Management [phone]

## 2022-11-10 ENCOUNTER — Other Ambulatory Visit: Payer: Self-pay | Admitting: Family Medicine

## 2022-11-11 ENCOUNTER — Encounter: Payer: Medicare Other | Admitting: *Deleted

## 2022-11-18 ENCOUNTER — Ambulatory Visit: Payer: Self-pay | Admitting: *Deleted

## 2022-11-18 NOTE — Patient Outreach (Signed)
  Care Coordination   Follow Up Visit Note   12/27/2022 Late entry for 11/18/22 Name: Alex Frazier MRN: [id number] DOB: 1945/10/18  Alex Frazier is a 77 y.o. year old male who sees Pickard, Priscille Heidelberg, MD for primary care. I spoke with  [name] by phone today.  What matters to the patients health and wellness today?  Memory changes Lot of near falls Walking & strength declined Having trouble getting out of chairs   Memory changes  "Why don't we bring Alex Frazier over here for a few days?"  His mother has been deceased for 19 years "Did Alex Frazier drive the [facility] home?"   Taking off some medicines for months  Thenganatt, Chales Abrahams "movement specialist" tasking to get her to [place] to appointment 12/26/22 w daughter [place] to refer to [place] Daughter assists with all appointments   Inquired about virtual signals break up  See pcp 12/05/22  Biggest issue is giving the patient a shower, issues with sliding across seat Tasking on wife to have home health [facility]) visits as it is hard to get Alex Frazier up in the mornings as he sleeps a lot and has difficulty enduring the last [facility] sessions in 2023. visiting [facility]   Medical issues discussed increased hearing, medications,  S/s of dehydration- skin integrity Previous pcp gave abx Wears depends push fluids hx aspiration use choc milk Consult with dementia np, pcp  Picks and scratches on legs  wife trying to catch him from falling on back     Goals Addressed             This Visit's Progress    Care Coordination Activities-Parkinsons disease       Care Coordination Interventions: Evaluation of current treatment plan related to Parkinson's disease and patient's adherence to plan as established by provider  Interventions Today    Flowsheet Row Most Recent Value  Chronic Disease   Chronic disease during today's visit Other  [parkinson, concerns with mobility & Activities of daily living, medicines, skin integrity, incontinence,  dementia, falls, home health]  General Interventions   General Interventions Discussed/Reviewed General Interventions Reviewed, Doctor Visits  Doctor Visits Discussed/Reviewed Doctor Visits Reviewed, PCP, Specialist  PCP/Specialist Visits Compliance with follow-up visit  Communication with PCP/Specialists, Social Work  Exercise Interventions   Exercise Discussed/Reviewed Exercise Reviewed, Physical Activity  Physical Activity Discussed/Reviewed Physical Activity Reviewed  Education Interventions   Education Provided Provided Education  Provided Verbal Education On Medication, Other  [parkinson, concerns with mobility & Activities of daily living, medicines, skin integrity, incontinence, dementia, falls, home health]  Mental Health Interventions   Mental Health Discussed/Reviewed Coping Strategies, Mental Health Discussed  Nutrition Interventions   Nutrition Discussed/Reviewed Nutrition Reviewed, Fluid intake  Pharmacy Interventions   Pharmacy Dicussed/Reviewed Pharmacy Topics Reviewed, Medications and their functions  Safety Interventions   Safety Discussed/Reviewed Safety Reviewed, Fall Risk, Home Safety  Home Safety Assistive Devices  [discussed home health services]             SDOH assessments and interventions completed:  No     Care Coordination Interventions:  Yes, provided   Follow up plan: Follow up call scheduled for 12/26/22    Encounter Outcome:  Pt. Visit Completed   Kruze Atchley L. Noelle Penner, RN, BSN, CCM [facility] Care Management Community Coordinator Office number [phone]

## 2022-12-05 ENCOUNTER — Ambulatory Visit: Payer: Medicare Other | Admitting: Family Medicine

## 2022-12-08 ENCOUNTER — Ambulatory Visit (INDEPENDENT_AMBULATORY_CARE_PROVIDER_SITE_OTHER): Payer: Medicare Other | Admitting: Family Medicine

## 2022-12-08 ENCOUNTER — Encounter: Payer: Self-pay | Admitting: Family Medicine

## 2022-12-08 VITALS — BP 116/70 | HR 71 | Ht 66.0 in | Wt 150.0 lb

## 2022-12-08 DIAGNOSIS — I951 Orthostatic hypotension: Secondary | ICD-10-CM

## 2022-12-08 DIAGNOSIS — R296 Repeated falls: Secondary | ICD-10-CM

## 2022-12-08 DIAGNOSIS — G20A1 Parkinson's disease without dyskinesia, without mention of fluctuations: Secondary | ICD-10-CM | POA: Diagnosis not present

## 2022-12-08 MED ORDER — FLUOXETINE HCL 20 MG PO CAPS: 20.0000 mg | ORAL_CAPSULE | Freq: Every day | ORAL | 3 refills | Status: DC

## 2022-12-08 NOTE — Progress Notes (Signed)
Subjective:    Patient ID: Alex Frazier, male    DOB: 1946/07/31, 77 y.o.   MRN: [id number]  HPI Patient is here today for checkup.  He is accompanied by his wife.  He is sitting in a wheelchair.  His quality of life continues to worse.  He has become increasingly more quiet and withdrawn.  He is tearful today when I ask if he is depressed.  He does not want to go to PT.  He is less mobile.  His wife is only able to bathe him once a week because of his rigidity.  He continues to have episodes of hallucinations but these are brief and do not present a problem for him.  It is harder and harder for his wife to leave the home with him as he has to be carried to the wheelchair.  He walks at hold short spans with a rolling walker.  Today he responds to my questions by looking at me but does not speak.    Past Medical History:  Diagnosis Date   B12 deficiency    Borderline   Bulging discs    Degenerative disc disease, cervical    Gastritis    Hyperlipidemia    Neuropathy    Parkinson's disease    Parkinsonian syndrome    Peripheral neuropathy    TIA (transient ischemic attack)    TIA symptoms from hypotension   Past Surgical History:  Procedure Laterality Date   APPENDECTOMY     BACK SURGERY     Current Outpatient Medications on File Prior to Visit  Medication Sig Dispense Refill   acetaminophen (TYLENOL) 500 MG tablet Take 2 tablets (1,000 mg total) by mouth 3 (three) times daily. 30 tablet 0   carbidopa-levodopa (SINEMET IR) 25-100 MG tablet Take 2 tablets by mouth See admin instructions. Take 2 tablets by mouth three times daily- 7 AM, 1 PM, and 7 PM     cholecalciferol (VITAMIN D3) 25 MCG (1000 UNIT) tablet Take 2,000 Units by mouth in the morning.      Cyanocobalamin (VITAMIN B 12 PO) Take 1 tablet by mouth in the morning.      DULoxetine (CYMBALTA) 60 MG capsule TAKE 1 CAPSULE(60 MG) BY MOUTH DAILY 90 capsule 2   fludrocortisone (FLORINEF) 0.1 MG tablet TAKE 2 TABLETS BY MOUTH  DAILY 180 tablet 3   midodrine (PROAMATINE) 10 MG tablet TAKE 1 TABLET BY MOUTH 3 TIMES DAILY. GENERIC EQUIVALENT FOR PROAMATINE 270 tablet 3   polyethylene glycol powder (GLYCOLAX/MIRALAX) 17 GM/SCOOP powder Take 17 g by mouth See admin instructions. Mix 17 grams of powder into 4-8 ounces of prune juice and drink once a day     pravastatin (PRAVACHOL) 20 MG tablet Take 1 tablet by mouth daily at 6 PM. 90 tablet 3   senna-docusate (SENOKOT-S) 8.6-50 MG tablet Take 2 tablets by mouth at bedtime as needed for mild constipation. 60 tablet 2   No current facility-administered medications on file prior to visit.   Allergies  Allergen Reactions   Tape Other (See Comments)    SKIN IS VERY THIN- TEARS VERY EASILY!!   Hydrocodone-Acetaminophen Other (See Comments)    Hallucinations and confusion   Indomethacin Other (See Comments)    Unknown reaction   Lyrica [Pregabalin] Other (See Comments)    Vivid hallucinations   Quetiapine Other (See Comments)    HYPOtension!!   Social History   Socioeconomic History   Marital status: Married    Spouse name: Not  on file   Number of children: 3   Years of education: Not on file   Highest education level: High school graduate  Occupational History   Not on file  Tobacco Use   Smoking status: Never   Smokeless tobacco: Never  Vaping Use   Vaping Use: Never used  Substance and Sexual Activity   Alcohol use: No   Drug use: No   Sexual activity: Not on file  Other Topics Concern   Not on file  Social History Narrative   Lives at home with his wife   Right handed   Caffeine: none   Social Determinants of Health   Financial Resource Strain: Low Risk  (08/19/2022)   Overall Financial Resource Strain (CARDIA)    Difficulty of Paying Living Expenses: Not hard at all  Food Insecurity: No Food Insecurity (08/19/2022)   Hunger Vital Sign    Worried About Running Out of Food in the Last Year: Never true    Ran Out of Food in the Last Year: Never  true  Transportation Needs: No Transportation Needs (08/19/2022)   PRAPARE - Hydrologist (Medical): No    Lack of Transportation (Non-Medical): No  Physical Activity: Inactive (08/19/2022)   Exercise Vital Sign    Days of Exercise per Week: 0 days    Minutes of Exercise per Session: 0 min  Stress: No Stress Concern Present (08/19/2022)   [place]    Feeling of Stress : Not at all  Social Connections: [place] (08/19/2022)   Social Connection and Isolation Panel [NHANES]    Frequency of Communication with Friends and Family: More than three times a week    Frequency of Social Gatherings with Friends and Family: Three times a week    Attends Religious Services: 1 to 4 times per year    Active Member of Clubs or Organizations: Yes    Attends Archivist Meetings: 1 to 4 times per year    Marital Status: Married  Human resources officer Violence: Not At Risk (08/19/2022)   Humiliation, Afraid, Rape, and Kick questionnaire    Fear of Current or Ex-Partner: No    Emotionally Abused: No    Physically Abused: No    Sexually Abused: No     Review of Systems  All other systems reviewed and are negative.      Objective:   Physical Exam Constitutional:      Appearance: Normal appearance. He is well-groomed and normal weight. He is not ill-appearing or toxic-appearing.  Cardiovascular:     Rate and Rhythm: Normal rate and regular rhythm.     Heart sounds: Normal heart sounds.  Pulmonary:     Effort: Pulmonary effort is normal.     Breath sounds: Normal breath sounds.  Neurological:     Mental Status: He is alert and oriented to person, place, and time. Mental status is at baseline.     Motor: Weakness present.     Coordination: Coordination abnormal.     Gait: Gait abnormal.  Psychiatric:        Attention and Perception: Attention normal.        Mood and Affect: Mood is  depressed. Affect is tearful.        Speech: Speech is delayed.        Behavior: Behavior is slowed and withdrawn.        Cognition and Memory: Memory is impaired.  Assessment & Plan:  Parkinson's disease, unspecified whether dyskinesia present, unspecified whether manifestations fluctuate - Plan: CBC with Differential/Platelet, COMPLETE METABOLIC PANEL WITH GFR Patient's interaction and communication has worsened over the last 6 months.  He appears depressed on exam.  Wife states he is afraid she is going to put "him away."  He is a prisoner in his own body and progressively isolated.  Discontinue cymbalta nad try prozac 20 mg poqday for MDD.  Reassess via phone in 4-6 weeks.  BP has been relative stabile on midodrine bid and florinef daily at lunch.  No episodes of syncope or collapse.

## 2022-12-09 LAB — COMPLETE METABOLIC PANEL WITH GFR
AG Ratio: 1.8 (calc) (ref 1.0–2.5)
ALT: 16 U/L (ref 9–46)
AST: 18 U/L (ref 10–35)
Albumin: 4.2 g/dL (ref 3.6–5.1)
Alkaline phosphatase (APISO): 119 U/L (ref 35–144)
BUN: 18 mg/dL (ref 7–25)
CO2: 28 mmol/L (ref 20–32)
Calcium: 9.4 mg/dL (ref 8.6–10.3)
Chloride: 103 mmol/L (ref 98–110)
Creat: 0.72 mg/dL (ref 0.70–1.28)
Globulin: 2.4 g/dL (calc) (ref 1.9–3.7)
Glucose, Bld: 139 mg/dL — ABNORMAL HIGH (ref 65–99)
Potassium: 4.4 mmol/L (ref 3.5–5.3)
Sodium: 140 mmol/L (ref 135–146)
Total Bilirubin: 0.6 mg/dL (ref 0.2–1.2)
Total Protein: 6.6 g/dL (ref 6.1–8.1)
eGFR: 95 mL/min/{1.73_m2} (ref >=60)

## 2022-12-09 LAB — CBC WITH DIFFERENTIAL/PLATELET
Absolute Monocytes: 514 cells/uL (ref 200–950)
Basophils Absolute: 42 cells/uL (ref 0–200)
Basophils Relative: 0.8 %
Eosinophils Absolute: 53 cells/uL (ref 15–500)
Eosinophils Relative: 1 %
HCT: 44 % (ref 38.5–50.0)
Hemoglobin: 15 g/dL (ref 13.2–17.1)
Lymphs Abs: 1028.2 cells/uL (ref 850–3900)
MCH: 29.8 pg (ref 27.0–33.0)
MCHC: 34.1 g/dL (ref 32.0–36.0)
MCV: 87.5 fL (ref 80.0–100.0)
MPV: 9.9 fL (ref 7.5–12.5)
Monocytes Relative: 9.7 %
Neutro Abs: 3662 cells/uL (ref 1500–7800)
Neutrophils Relative %: 69.1 %
Platelets: 257 10*3/uL (ref 140–400)
RBC: 5.03 10*6/uL (ref 4.20–5.80)
RDW: 13 % (ref 11.0–15.0)
Total Lymphocyte: 19.4 %
WBC: 5.3 10*3/uL (ref 3.8–10.8)

## 2022-12-26 ENCOUNTER — Ambulatory Visit: Payer: Self-pay | Admitting: *Deleted

## 2022-12-26 DIAGNOSIS — G20B1 Parkinson's disease with dyskinesia, without mention of fluctuations: Secondary | ICD-10-CM | POA: Diagnosis not present

## 2022-12-26 NOTE — Patient Outreach (Incomplete)
Care Coordination   Follow Up Visit Note   12/27/2022 Name: Alex Frazier MRN: [id number] DOB: 04/19/1946  Alex Frazier is a 77 y.o. year old male who sees Pickard, Priscille Heidelberg, MD for primary care. I spoke with  Alex Frazier by phone today.  What matters to the patients health and wellness today?  Various changes in blood pressure (BP) during last weekend (12/20/22) while at the table at lunch time. 189/90 BP and decreased to 141/77, she reports other values of 54/36- 125/69 He generally take midodrine to increase his BPs  Alex Frazier was able to reached out to on call of pcp no response so she called [facility] 24 hour nurse line services successfully Report a presyncope episode while sitting a the kitchen table. Wife reports he had not been taking in fluids well. Wife reports the patient generally stays constipation. When wife was able to get patient settled his blood pressure was low. Discussed not administering medications for low blood pressure values   Has a scheduled appointment with [facility]-Dr [place] 12/26/22    Alex Frazier last night when standing in front of toilet, knees buckled even with wife holding on to him, had a freezing episode,  patient and wife only sustained some scrapes  Florinef cause dizziness  Wife called pharmacy to check on use of Prozac Started on 12/16/22 Pickard ordered to reduce un interrupted sleep in afternoon vs no more dozing and jerky movements in his sleep    Activities of daily living (ADLs) Appetite still good Walks with an U step Rollator     Goals Addressed               This Visit's Progress     Patient Stated     COMPLETED: Community resources to assist with safe Showers, transfers to car Alex [place]) (pt-stated)        Care Coordination Interventions: Duplicate goal closure      COMPLETED: Decrease falls ([facility]) (pt-stated)        Care Coordination Interventions: Duplicate goal closure      COMPLETED: Decrease falls  ([facility]) (pt-stated)        Care Coordination Interventions: Assessed for falls since last encounter Reinforced fall precautions and home safety Obtained bed ladder but does not find beneficial Duplicate goal closure      COMPLETED: manage sleep ([facility]) (pt-stated)        Duplicate goal closure      COMPLETED: manage Urinary tract infections ([facility]) (pt-stated)        Care Coordination Interventions: Duplicate goal closure      Other     Care Coordination Activities-Parkinsons disease   Not on track     Care Coordination Interventions: Evaluation of current treatment plan related to Parkinson's disease and patient's adherence to plan as established by provider  Interventions Today    Flowsheet Row Most Recent Value  Chronic Disease   Chronic disease during today's visit Other  [hypotension worsening symptoms, local neurologist, follow up [facility] 24 RN outreach]  General Interventions   General Interventions Discussed/Reviewed Communication with  Doctor Visits Discussed/Reviewed --  [Discussed neurologists]  PCP/Specialist Visits Compliance with follow-up visit  Communication with PCP/Specialists  [outreached to [place] neurology in [place] to inquire if possibility of a future neurology satellite office in [place]]  Exercise Interventions   Exercise Discussed/Reviewed Exercise Reviewed, Physical Activity  Physical Activity Discussed/Reviewed Physical Activity Reviewed  Education Interventions   Education Provided Provided Printed Education, Provided Education  [  Education online for hypotension, hypertension,]  Provided Verbal Education On Medication, When to see the doctor  [Discussed not administering medications for low blood pressure values]  Pharmacy Interventions   Pharmacy Dicussed/Reviewed Pharmacy Topics Reviewed, Medications and their functions  [reviewed the side effects of Florinef, midodrine and prozac]             SDOH assessments and interventions  completed:  No     Care Coordination Interventions:  Yes, provided   Follow up plan: Follow up call scheduled for 01/02/23    Encounter Outcome:  Pt. Visit Completed   Brentley Horrell L. Noelle Penner, RN, BSN, CCM [facility] Care Management Community Coordinator Office number [phone]

## 2022-12-27 NOTE — Patient Instructions (Addendum)
Visit Information  Thank you for taking time to visit with me today. Please don't hesitate to contact me if I can be of assistance to you.   Following are the goals we discussed today:   Goals Addressed     Other     Care Coordination Activities-Parkinsons disease   Not on track     Care Coordination Interventions: Evaluation of current treatment plan related to Parkinson's disease and patient's adherence to plan as established by provider  Interventions Today    Flowsheet Row Most Recent Value  Chronic Disease   Chronic disease during today's visit Other  [hypotension worsening symptoms, local neurologist, follow up [facility] 24 RN outreach]  General Interventions   General Interventions Discussed/Reviewed Communication with  Doctor Visits Discussed/Reviewed --  [Discussed neurologists]  PCP/Specialist Visits Compliance with follow-up visit  Communication with PCP/Specialists  [outreached to [facility] neurology in [place] to inquire if possibility of a future neurology satellite office in [place] county]  Exercise Interventions   Exercise Discussed/Reviewed Exercise Reviewed, Physical Activity  Physical Activity Discussed/Reviewed Physical Activity Reviewed  Education Interventions   Education Provided Provided Education  [hypotension , hypertension, valsalva maneuver]  Provided Verbal Education On Nutrition, Medication           Our next appointment is by telephone on 01/02/23 at 1100  Please call the care guide team at [phone] if you need to cancel or reschedule your appointment.   If you are experiencing a Mental Health or Behavioral Health Crisis or need someone to talk to, please call the Suicide and Crisis Lifeline: 988 call the [place] National Suicide Prevention Lifeline: 787 683 3430 or TTY: 7014124130 TTY 7127160837) to talk to a trained counselor call 1-800-273-TALK (toll free, 24 hour hotline) call the [facility]: [phone]    Patient verbalizes understanding of instructions and care plan provided today and agrees to view in MyChart. Active MyChart status and patient understanding of how to access instructions and care plan via MyChart confirmed with patient.     The patient has been provided with contact information for the care management team and has been advised to call with any health related questions or concerns.   Kailyn Dubie L. Noelle Penner, RN, BSN, CCM [facility] Care Management Community Coordinator Office number [phone]

## 2022-12-27 NOTE — Patient Instructions (Signed)
Visit Information  Thank you for taking time to visit with me today. Please don't hesitate to contact me if I can be of assistance to you.   Following are the goals we discussed today:   Goals Addressed             This Visit's Progress    Care Coordination Activities-Parkinsons disease       Care Coordination Interventions: Evaluation of current treatment plan related to Parkinson's disease and patient's adherence to plan as established by provider  Interventions Today    Flowsheet Row Most Recent Value  Chronic Disease   Chronic disease during today's visit Other  [parkinson, concerns with mobility & Activities of daily living, medicines, skin integrity, incontinence, dementia, falls, home health]  General Interventions   General Interventions Discussed/Reviewed General Interventions Reviewed, Doctor Visits  Doctor Visits Discussed/Reviewed Doctor Visits Reviewed, PCP, Specialist  PCP/Specialist Visits Compliance with follow-up visit  Communication with PCP/Specialists, Social Work  Exercise Interventions   Exercise Discussed/Reviewed Exercise Reviewed, Physical Activity  Physical Activity Discussed/Reviewed Physical Activity Reviewed  Education Interventions   Education Provided Provided Education  Provided Verbal Education On Medication, Other  [parkinson, concerns with mobility & Activities of daily living, medicines, skin integrity, incontinence, dementia, falls, home health]  Mental Health Interventions   Mental Health Discussed/Reviewed Coping Strategies, Mental Health Discussed  Nutrition Interventions   Nutrition Discussed/Reviewed Nutrition Reviewed, Fluid intake  Pharmacy Interventions   Pharmacy Dicussed/Reviewed Pharmacy Topics Reviewed, Medications and their functions  Safety Interventions   Safety Discussed/Reviewed Safety Reviewed, Fall Risk, Home Safety  Home Safety Assistive Devices  [discussed home health services]             Our next appointment  is by telephone on 12/26/22 at 0930  Please call the care guide team at [phone] if you need to cancel or reschedule your appointment.   If you are experiencing a Mental Health or Behavioral Health Crisis or need someone to talk to, please call the Suicide and Crisis Lifeline: 988 call the [place] National Suicide Prevention Lifeline: 202-616-9830 or TTY: 838-582-5767 TTY 2030552902) to talk to a trained counselor call 1-800-273-TALK (toll free, 24 hour hotline) go to [facility] [address] [place] [phone]) call the [facility] Crisis Line: (267) 197-5555   Patient verbalizes understanding of instructions and care plan provided today and agrees to view in MyChart. Active MyChart status and patient understanding of how to access instructions and care plan via MyChart confirmed with patient.     The patient has been provided with contact information for the care management team and has been advised to call with any health related questions or concerns.   Yuma Pacella L. Noelle Penner, RN, BSN, CCM [facility] Care Management Community Coordinator Office number [phone]

## 2022-12-30 NOTE — Addendum Note (Signed)
Addended by: Lynnea Ferrier T on: 12/30/2022 02:59 PM   Modules accepted: Orders

## 2023-01-02 ENCOUNTER — Ambulatory Visit: Payer: Self-pay | Admitting: *Deleted

## 2023-01-02 NOTE — Patient Outreach (Signed)
  Care Coordination   Follow Up Visit Note   01/02/2023 Name: Alex Frazier MRN: [id number] DOB: 10/25/1945  Alex Frazier is a 77 y.o. year old male who sees Pickard, Priscille Heidelberg, MD for primary care. I spoke with  Alex Frazier by phone today.  What matters to the patients health and wellness today?  Things are better. Spoke with pcp about changes in blood pressure- ? Progression of  parkinson   a recent home BP value 110/58 70,  Patient is more stable  [facility] appointment went well  Reduction in Carbidopa- levodopa dose  changed to help with fluctuating blood pressure So far not immediate benefit seen  He can not get out chairs without help from family  Home health services vs personal care services for bath aide  Wife voiced concern of multiple therapies in the home   Personal care services - No medicaid and wife does not believe they would qualify Alex Frazier is open for RN CM to consult with [facility] SW to see if available resources for home bath aide, considering they may not qualify for [place] medicaid   Still taking long naps with less jerking and twitching - after Prozac offered by pcp    Goals Addressed             This Visit's Progress    Care Coordination Activities-Parkinsons disease   On track    Care Coordination Interventions: Evaluation of current treatment plan related to Parkinson's disease and patient's adherence to plan as established by provider  Interventions Today    Flowsheet Row Most Recent Value  Chronic Disease   Chronic disease during today's visit Other, Hypertension (HTN)  [parkinson, home health , personal care service, neurology changes in medications]  General Interventions   General Interventions Discussed/Reviewed General Interventions Reviewed, Doctor Visits, Communication with  [reviewed RN CM & 24 hr nurse line numbers and availability]  Doctor Visits Discussed/Reviewed Doctor Visits Reviewed, PCP, Specialist  PCP/Specialist  Visits Compliance with follow-up visit  Communication with Social Work  [consulted [facility] SW about a possible program to assist patient with personal care bath aide if does not qualify for medicaid]  Exercise Interventions   Exercise Discussed/Reviewed Exercise Reviewed, Physical Activity  [confirms he needs help getting out of any chair]  Physical Activity Discussed/Reviewed Physical Activity Reviewed  [minimal at this time]  Education Interventions   Education Provided Provided Education  [facility] SW consult for possible resource for home bath aide]  Provided Verbal Education On Dance movement psychotherapist health service, personal care services]  Mental Health Interventions   Mental Health Discussed/Reviewed Mental Health Reviewed, Coping Strategies  Nutrition Interventions   Nutrition Discussed/Reviewed Nutrition Reviewed, Fluid intake  Pharmacy Interventions   Pharmacy Dicussed/Reviewed Pharmacy Topics Reviewed, Affording Medications  Safety Interventions   Safety Discussed/Reviewed Safety Reviewed, Home Safety  Home Safety Assistive Devices             SDOH assessments and interventions completed:  No     Care Coordination Interventions:  Yes, provided   Follow up plan: Follow up call scheduled for 02/09/23    Encounter Outcome:  Pt. Visit Completed   Alex Frazier L. Noelle Penner, RN, BSN, CCM [facility] Care Management Community Coordinator Office number [phone]

## 2023-01-02 NOTE — Patient Instructions (Addendum)
Visit Information  Thank you for taking time to visit with me today. Please don't hesitate to contact me if I can be of assistance to you.   Following are the goals we discussed today:   Goals Addressed             This Visit's Progress    Care Coordination Activities-Parkinsons disease   On track    Care Coordination Interventions: Evaluation of current treatment plan related to Parkinson's disease and patient's adherence to plan as established by provider  Interventions Today    Flowsheet Row Most Recent Value  Chronic Disease   Chronic disease during today's visit Other, Hypertension (HTN)  [parkinson, home health , personal care service, neurology changes in medications]  General Interventions   General Interventions Discussed/Reviewed General Interventions Reviewed, Doctor Visits, Communication with  [reviewed RN CM & 24 hr nurse line numbers and availability]  Doctor Visits Discussed/Reviewed Doctor Visits Reviewed, PCP, Specialist  PCP/Specialist Visits Compliance with follow-up visit  Communication with Social Work  [consulted [facility] SW about a possible program to assist patient with personal care bath aide if does not qualify for medicaid]  Exercise Interventions   Exercise Discussed/Reviewed Exercise Reviewed, Physical Activity  [confirms he needs help getting out of any chair]  Physical Activity Discussed/Reviewed Physical Activity Reviewed  [minimal at this time]  Education Interventions   Education Provided Provided Education  [facility] SW consult for possible resource for home bath aide]  Provided Verbal Education On [employer] health service, personal care services]  Mental Health Interventions   Mental Health Discussed/Reviewed Mental Health Reviewed, Coping Strategies  Nutrition Interventions   Nutrition Discussed/Reviewed Nutrition Reviewed, Fluid intake  Pharmacy Interventions   Pharmacy Dicussed/Reviewed Pharmacy Topics Reviewed, Affording Medications   Safety Interventions   Safety Discussed/Reviewed Safety Reviewed, Home Safety  Home Safety Assistive Devices             Our next appointment is by telephone on 02/09/23 at 1 pm  Please call the care guide team at [phone] if you need to cancel or reschedule your appointment.   If you are experiencing a Mental Health or Behavioral Health Crisis or need someone to talk to, please call the Suicide and Crisis Lifeline: 988 call the [place] National Suicide Prevention Lifeline: 332-823-1021 or TTY: 4755006753 TTY 367-690-0902) to talk to a trained counselor call 1-800-273-TALK (toll free, 24 hour hotline) call the [facility]: 956-195-4111 call 911   Patient verbalizes understanding of instructions and care plan provided today and agrees to view in MyChart. Active MyChart status and patient understanding of how to access instructions and care plan via MyChart confirmed with patient.     The patient has been provided with contact information for the care management team and has been advised to call with any health related questions or concerns.   Kenecia Barren L. Noelle Penner, RN, BSN, CCM [facility] Care Management Community Coordinator Office number [phone]

## 2023-01-03 DIAGNOSIS — G629 Polyneuropathy, unspecified: Secondary | ICD-10-CM | POA: Diagnosis not present

## 2023-01-03 DIAGNOSIS — M5030 Other cervical disc degeneration, unspecified cervical region: Secondary | ICD-10-CM | POA: Diagnosis not present

## 2023-01-03 DIAGNOSIS — Z9181 History of falling: Secondary | ICD-10-CM | POA: Diagnosis not present

## 2023-01-03 DIAGNOSIS — R2681 Unsteadiness on feet: Secondary | ICD-10-CM | POA: Diagnosis not present

## 2023-01-03 DIAGNOSIS — I951 Orthostatic hypotension: Secondary | ICD-10-CM | POA: Diagnosis not present

## 2023-01-03 DIAGNOSIS — E538 Deficiency of other specified B group vitamins: Secondary | ICD-10-CM | POA: Diagnosis not present

## 2023-01-03 DIAGNOSIS — F32A Depression, unspecified: Secondary | ICD-10-CM | POA: Diagnosis not present

## 2023-01-03 DIAGNOSIS — R296 Repeated falls: Secondary | ICD-10-CM | POA: Diagnosis not present

## 2023-01-03 DIAGNOSIS — Z556 Problems related to health literacy: Secondary | ICD-10-CM | POA: Diagnosis not present

## 2023-01-03 DIAGNOSIS — E785 Hyperlipidemia, unspecified: Secondary | ICD-10-CM | POA: Diagnosis not present

## 2023-01-03 DIAGNOSIS — G20A1 Parkinson's disease without dyskinesia, without mention of fluctuations: Secondary | ICD-10-CM | POA: Diagnosis not present

## 2023-01-05 DIAGNOSIS — I951 Orthostatic hypotension: Secondary | ICD-10-CM | POA: Diagnosis not present

## 2023-01-05 DIAGNOSIS — E785 Hyperlipidemia, unspecified: Secondary | ICD-10-CM | POA: Diagnosis not present

## 2023-01-05 DIAGNOSIS — G629 Polyneuropathy, unspecified: Secondary | ICD-10-CM | POA: Diagnosis not present

## 2023-01-05 DIAGNOSIS — E538 Deficiency of other specified B group vitamins: Secondary | ICD-10-CM | POA: Diagnosis not present

## 2023-01-05 DIAGNOSIS — F32A Depression, unspecified: Secondary | ICD-10-CM | POA: Diagnosis not present

## 2023-01-05 DIAGNOSIS — G20A1 Parkinson's disease without dyskinesia, without mention of fluctuations: Secondary | ICD-10-CM | POA: Diagnosis not present

## 2023-01-06 DIAGNOSIS — G20A1 Parkinson's disease without dyskinesia, without mention of fluctuations: Secondary | ICD-10-CM | POA: Diagnosis not present

## 2023-01-06 DIAGNOSIS — E538 Deficiency of other specified B group vitamins: Secondary | ICD-10-CM | POA: Diagnosis not present

## 2023-01-06 DIAGNOSIS — G629 Polyneuropathy, unspecified: Secondary | ICD-10-CM | POA: Diagnosis not present

## 2023-01-06 DIAGNOSIS — F32A Depression, unspecified: Secondary | ICD-10-CM | POA: Diagnosis not present

## 2023-01-06 DIAGNOSIS — I951 Orthostatic hypotension: Secondary | ICD-10-CM | POA: Diagnosis not present

## 2023-01-06 DIAGNOSIS — E785 Hyperlipidemia, unspecified: Secondary | ICD-10-CM | POA: Diagnosis not present

## 2023-01-07 DIAGNOSIS — G20A1 Parkinson's disease without dyskinesia, without mention of fluctuations: Secondary | ICD-10-CM | POA: Diagnosis not present

## 2023-01-07 DIAGNOSIS — F32A Depression, unspecified: Secondary | ICD-10-CM | POA: Diagnosis not present

## 2023-01-07 DIAGNOSIS — E785 Hyperlipidemia, unspecified: Secondary | ICD-10-CM | POA: Diagnosis not present

## 2023-01-07 DIAGNOSIS — I951 Orthostatic hypotension: Secondary | ICD-10-CM | POA: Diagnosis not present

## 2023-01-07 DIAGNOSIS — E538 Deficiency of other specified B group vitamins: Secondary | ICD-10-CM | POA: Diagnosis not present

## 2023-01-07 DIAGNOSIS — G629 Polyneuropathy, unspecified: Secondary | ICD-10-CM | POA: Diagnosis not present

## 2023-01-08 DIAGNOSIS — E785 Hyperlipidemia, unspecified: Secondary | ICD-10-CM | POA: Diagnosis not present

## 2023-01-08 DIAGNOSIS — E538 Deficiency of other specified B group vitamins: Secondary | ICD-10-CM | POA: Diagnosis not present

## 2023-01-08 DIAGNOSIS — G20A1 Parkinson's disease without dyskinesia, without mention of fluctuations: Secondary | ICD-10-CM | POA: Diagnosis not present

## 2023-01-08 DIAGNOSIS — F32A Depression, unspecified: Secondary | ICD-10-CM | POA: Diagnosis not present

## 2023-01-08 DIAGNOSIS — I951 Orthostatic hypotension: Secondary | ICD-10-CM | POA: Diagnosis not present

## 2023-01-08 DIAGNOSIS — G629 Polyneuropathy, unspecified: Secondary | ICD-10-CM | POA: Diagnosis not present

## 2023-01-09 DIAGNOSIS — I951 Orthostatic hypotension: Secondary | ICD-10-CM | POA: Diagnosis not present

## 2023-01-09 DIAGNOSIS — G20A1 Parkinson's disease without dyskinesia, without mention of fluctuations: Secondary | ICD-10-CM | POA: Diagnosis not present

## 2023-01-09 DIAGNOSIS — F32A Depression, unspecified: Secondary | ICD-10-CM | POA: Diagnosis not present

## 2023-01-09 DIAGNOSIS — E538 Deficiency of other specified B group vitamins: Secondary | ICD-10-CM | POA: Diagnosis not present

## 2023-01-09 DIAGNOSIS — G629 Polyneuropathy, unspecified: Secondary | ICD-10-CM | POA: Diagnosis not present

## 2023-01-09 DIAGNOSIS — E785 Hyperlipidemia, unspecified: Secondary | ICD-10-CM | POA: Diagnosis not present

## 2023-01-12 DIAGNOSIS — F32A Depression, unspecified: Secondary | ICD-10-CM | POA: Diagnosis not present

## 2023-01-12 DIAGNOSIS — I951 Orthostatic hypotension: Secondary | ICD-10-CM | POA: Diagnosis not present

## 2023-01-12 DIAGNOSIS — G629 Polyneuropathy, unspecified: Secondary | ICD-10-CM | POA: Diagnosis not present

## 2023-01-12 DIAGNOSIS — E785 Hyperlipidemia, unspecified: Secondary | ICD-10-CM | POA: Diagnosis not present

## 2023-01-12 DIAGNOSIS — G20A1 Parkinson's disease without dyskinesia, without mention of fluctuations: Secondary | ICD-10-CM | POA: Diagnosis not present

## 2023-01-12 DIAGNOSIS — E538 Deficiency of other specified B group vitamins: Secondary | ICD-10-CM | POA: Diagnosis not present

## 2023-01-14 DIAGNOSIS — E785 Hyperlipidemia, unspecified: Secondary | ICD-10-CM | POA: Diagnosis not present

## 2023-01-14 DIAGNOSIS — G629 Polyneuropathy, unspecified: Secondary | ICD-10-CM | POA: Diagnosis not present

## 2023-01-14 DIAGNOSIS — F32A Depression, unspecified: Secondary | ICD-10-CM | POA: Diagnosis not present

## 2023-01-14 DIAGNOSIS — I951 Orthostatic hypotension: Secondary | ICD-10-CM | POA: Diagnosis not present

## 2023-01-14 DIAGNOSIS — H524 Presbyopia: Secondary | ICD-10-CM | POA: Diagnosis not present

## 2023-01-14 DIAGNOSIS — H52203 Unspecified astigmatism, bilateral: Secondary | ICD-10-CM | POA: Diagnosis not present

## 2023-01-14 DIAGNOSIS — H04123 Dry eye syndrome of bilateral lacrimal glands: Secondary | ICD-10-CM | POA: Diagnosis not present

## 2023-01-14 DIAGNOSIS — E538 Deficiency of other specified B group vitamins: Secondary | ICD-10-CM | POA: Diagnosis not present

## 2023-01-14 DIAGNOSIS — H26493 Other secondary cataract, bilateral: Secondary | ICD-10-CM | POA: Diagnosis not present

## 2023-01-14 DIAGNOSIS — G20A1 Parkinson's disease without dyskinesia, without mention of fluctuations: Secondary | ICD-10-CM | POA: Diagnosis not present

## 2023-01-15 DIAGNOSIS — E785 Hyperlipidemia, unspecified: Secondary | ICD-10-CM | POA: Diagnosis not present

## 2023-01-15 DIAGNOSIS — G20A1 Parkinson's disease without dyskinesia, without mention of fluctuations: Secondary | ICD-10-CM | POA: Diagnosis not present

## 2023-01-15 DIAGNOSIS — G629 Polyneuropathy, unspecified: Secondary | ICD-10-CM | POA: Diagnosis not present

## 2023-01-15 DIAGNOSIS — E538 Deficiency of other specified B group vitamins: Secondary | ICD-10-CM | POA: Diagnosis not present

## 2023-01-15 DIAGNOSIS — F32A Depression, unspecified: Secondary | ICD-10-CM | POA: Diagnosis not present

## 2023-01-15 DIAGNOSIS — I951 Orthostatic hypotension: Secondary | ICD-10-CM | POA: Diagnosis not present

## 2023-01-16 DIAGNOSIS — E538 Deficiency of other specified B group vitamins: Secondary | ICD-10-CM | POA: Diagnosis not present

## 2023-01-16 DIAGNOSIS — E785 Hyperlipidemia, unspecified: Secondary | ICD-10-CM | POA: Diagnosis not present

## 2023-01-16 DIAGNOSIS — F32A Depression, unspecified: Secondary | ICD-10-CM | POA: Diagnosis not present

## 2023-01-16 DIAGNOSIS — G20A1 Parkinson's disease without dyskinesia, without mention of fluctuations: Secondary | ICD-10-CM | POA: Diagnosis not present

## 2023-01-16 DIAGNOSIS — I951 Orthostatic hypotension: Secondary | ICD-10-CM | POA: Diagnosis not present

## 2023-01-16 DIAGNOSIS — G629 Polyneuropathy, unspecified: Secondary | ICD-10-CM | POA: Diagnosis not present

## 2023-01-19 DIAGNOSIS — F32A Depression, unspecified: Secondary | ICD-10-CM | POA: Diagnosis not present

## 2023-01-19 DIAGNOSIS — E785 Hyperlipidemia, unspecified: Secondary | ICD-10-CM | POA: Diagnosis not present

## 2023-01-19 DIAGNOSIS — I951 Orthostatic hypotension: Secondary | ICD-10-CM | POA: Diagnosis not present

## 2023-01-19 DIAGNOSIS — E538 Deficiency of other specified B group vitamins: Secondary | ICD-10-CM | POA: Diagnosis not present

## 2023-01-19 DIAGNOSIS — G629 Polyneuropathy, unspecified: Secondary | ICD-10-CM | POA: Diagnosis not present

## 2023-01-19 DIAGNOSIS — G20A1 Parkinson's disease without dyskinesia, without mention of fluctuations: Secondary | ICD-10-CM | POA: Diagnosis not present

## 2023-01-20 DIAGNOSIS — E538 Deficiency of other specified B group vitamins: Secondary | ICD-10-CM | POA: Diagnosis not present

## 2023-01-20 DIAGNOSIS — E785 Hyperlipidemia, unspecified: Secondary | ICD-10-CM | POA: Diagnosis not present

## 2023-01-20 DIAGNOSIS — G20A1 Parkinson's disease without dyskinesia, without mention of fluctuations: Secondary | ICD-10-CM | POA: Diagnosis not present

## 2023-01-20 DIAGNOSIS — G629 Polyneuropathy, unspecified: Secondary | ICD-10-CM | POA: Diagnosis not present

## 2023-01-20 DIAGNOSIS — I951 Orthostatic hypotension: Secondary | ICD-10-CM | POA: Diagnosis not present

## 2023-01-20 DIAGNOSIS — F32A Depression, unspecified: Secondary | ICD-10-CM | POA: Diagnosis not present

## 2023-01-21 DIAGNOSIS — E538 Deficiency of other specified B group vitamins: Secondary | ICD-10-CM | POA: Diagnosis not present

## 2023-01-21 DIAGNOSIS — F32A Depression, unspecified: Secondary | ICD-10-CM | POA: Diagnosis not present

## 2023-01-21 DIAGNOSIS — I951 Orthostatic hypotension: Secondary | ICD-10-CM | POA: Diagnosis not present

## 2023-01-21 DIAGNOSIS — G20A1 Parkinson's disease without dyskinesia, without mention of fluctuations: Secondary | ICD-10-CM | POA: Diagnosis not present

## 2023-01-21 DIAGNOSIS — G629 Polyneuropathy, unspecified: Secondary | ICD-10-CM | POA: Diagnosis not present

## 2023-01-21 DIAGNOSIS — E785 Hyperlipidemia, unspecified: Secondary | ICD-10-CM | POA: Diagnosis not present

## 2023-01-22 DIAGNOSIS — G629 Polyneuropathy, unspecified: Secondary | ICD-10-CM | POA: Diagnosis not present

## 2023-01-22 DIAGNOSIS — I951 Orthostatic hypotension: Secondary | ICD-10-CM | POA: Diagnosis not present

## 2023-01-22 DIAGNOSIS — F32A Depression, unspecified: Secondary | ICD-10-CM | POA: Diagnosis not present

## 2023-01-22 DIAGNOSIS — E538 Deficiency of other specified B group vitamins: Secondary | ICD-10-CM | POA: Diagnosis not present

## 2023-01-22 DIAGNOSIS — G20A1 Parkinson's disease without dyskinesia, without mention of fluctuations: Secondary | ICD-10-CM | POA: Diagnosis not present

## 2023-01-22 DIAGNOSIS — E785 Hyperlipidemia, unspecified: Secondary | ICD-10-CM | POA: Diagnosis not present

## 2023-01-26 DIAGNOSIS — I951 Orthostatic hypotension: Secondary | ICD-10-CM | POA: Diagnosis not present

## 2023-01-26 DIAGNOSIS — F32A Depression, unspecified: Secondary | ICD-10-CM | POA: Diagnosis not present

## 2023-01-26 DIAGNOSIS — E538 Deficiency of other specified B group vitamins: Secondary | ICD-10-CM | POA: Diagnosis not present

## 2023-01-26 DIAGNOSIS — E785 Hyperlipidemia, unspecified: Secondary | ICD-10-CM | POA: Diagnosis not present

## 2023-01-26 DIAGNOSIS — G20A1 Parkinson's disease without dyskinesia, without mention of fluctuations: Secondary | ICD-10-CM | POA: Diagnosis not present

## 2023-01-26 DIAGNOSIS — G629 Polyneuropathy, unspecified: Secondary | ICD-10-CM | POA: Diagnosis not present

## 2023-01-27 DIAGNOSIS — G629 Polyneuropathy, unspecified: Secondary | ICD-10-CM | POA: Diagnosis not present

## 2023-01-27 DIAGNOSIS — G20A1 Parkinson's disease without dyskinesia, without mention of fluctuations: Secondary | ICD-10-CM | POA: Diagnosis not present

## 2023-01-27 DIAGNOSIS — F32A Depression, unspecified: Secondary | ICD-10-CM | POA: Diagnosis not present

## 2023-01-27 DIAGNOSIS — I951 Orthostatic hypotension: Secondary | ICD-10-CM | POA: Diagnosis not present

## 2023-01-27 DIAGNOSIS — E785 Hyperlipidemia, unspecified: Secondary | ICD-10-CM | POA: Diagnosis not present

## 2023-01-27 DIAGNOSIS — E538 Deficiency of other specified B group vitamins: Secondary | ICD-10-CM | POA: Diagnosis not present

## 2023-01-28 DIAGNOSIS — I951 Orthostatic hypotension: Secondary | ICD-10-CM | POA: Diagnosis not present

## 2023-01-28 DIAGNOSIS — E785 Hyperlipidemia, unspecified: Secondary | ICD-10-CM | POA: Diagnosis not present

## 2023-01-28 DIAGNOSIS — G629 Polyneuropathy, unspecified: Secondary | ICD-10-CM | POA: Diagnosis not present

## 2023-01-28 DIAGNOSIS — F32A Depression, unspecified: Secondary | ICD-10-CM | POA: Diagnosis not present

## 2023-01-28 DIAGNOSIS — E538 Deficiency of other specified B group vitamins: Secondary | ICD-10-CM | POA: Diagnosis not present

## 2023-01-28 DIAGNOSIS — G20A1 Parkinson's disease without dyskinesia, without mention of fluctuations: Secondary | ICD-10-CM | POA: Diagnosis not present

## 2023-02-02 DIAGNOSIS — G20A1 Parkinson's disease without dyskinesia, without mention of fluctuations: Secondary | ICD-10-CM | POA: Diagnosis not present

## 2023-02-02 DIAGNOSIS — R296 Repeated falls: Secondary | ICD-10-CM | POA: Diagnosis not present

## 2023-02-02 DIAGNOSIS — I951 Orthostatic hypotension: Secondary | ICD-10-CM | POA: Diagnosis not present

## 2023-02-02 DIAGNOSIS — E538 Deficiency of other specified B group vitamins: Secondary | ICD-10-CM | POA: Diagnosis not present

## 2023-02-02 DIAGNOSIS — F32A Depression, unspecified: Secondary | ICD-10-CM | POA: Diagnosis not present

## 2023-02-02 DIAGNOSIS — Z9181 History of falling: Secondary | ICD-10-CM | POA: Diagnosis not present

## 2023-02-02 DIAGNOSIS — Z556 Problems related to health literacy: Secondary | ICD-10-CM | POA: Diagnosis not present

## 2023-02-02 DIAGNOSIS — M5030 Other cervical disc degeneration, unspecified cervical region: Secondary | ICD-10-CM | POA: Diagnosis not present

## 2023-02-02 DIAGNOSIS — G629 Polyneuropathy, unspecified: Secondary | ICD-10-CM | POA: Diagnosis not present

## 2023-02-02 DIAGNOSIS — E785 Hyperlipidemia, unspecified: Secondary | ICD-10-CM | POA: Diagnosis not present

## 2023-02-02 DIAGNOSIS — R2681 Unsteadiness on feet: Secondary | ICD-10-CM | POA: Diagnosis not present

## 2023-02-03 DIAGNOSIS — I951 Orthostatic hypotension: Secondary | ICD-10-CM | POA: Diagnosis not present

## 2023-02-03 DIAGNOSIS — E785 Hyperlipidemia, unspecified: Secondary | ICD-10-CM | POA: Diagnosis not present

## 2023-02-03 DIAGNOSIS — G20A1 Parkinson's disease without dyskinesia, without mention of fluctuations: Secondary | ICD-10-CM | POA: Diagnosis not present

## 2023-02-03 DIAGNOSIS — G629 Polyneuropathy, unspecified: Secondary | ICD-10-CM | POA: Diagnosis not present

## 2023-02-03 DIAGNOSIS — F32A Depression, unspecified: Secondary | ICD-10-CM | POA: Diagnosis not present

## 2023-02-03 DIAGNOSIS — E538 Deficiency of other specified B group vitamins: Secondary | ICD-10-CM | POA: Diagnosis not present

## 2023-02-05 DIAGNOSIS — E785 Hyperlipidemia, unspecified: Secondary | ICD-10-CM | POA: Diagnosis not present

## 2023-02-05 DIAGNOSIS — G20A1 Parkinson's disease without dyskinesia, without mention of fluctuations: Secondary | ICD-10-CM | POA: Diagnosis not present

## 2023-02-05 DIAGNOSIS — E538 Deficiency of other specified B group vitamins: Secondary | ICD-10-CM | POA: Diagnosis not present

## 2023-02-05 DIAGNOSIS — G629 Polyneuropathy, unspecified: Secondary | ICD-10-CM | POA: Diagnosis not present

## 2023-02-05 DIAGNOSIS — I951 Orthostatic hypotension: Secondary | ICD-10-CM | POA: Diagnosis not present

## 2023-02-05 DIAGNOSIS — F32A Depression, unspecified: Secondary | ICD-10-CM | POA: Diagnosis not present

## 2023-02-06 DIAGNOSIS — E785 Hyperlipidemia, unspecified: Secondary | ICD-10-CM | POA: Diagnosis not present

## 2023-02-06 DIAGNOSIS — E538 Deficiency of other specified B group vitamins: Secondary | ICD-10-CM | POA: Diagnosis not present

## 2023-02-06 DIAGNOSIS — I951 Orthostatic hypotension: Secondary | ICD-10-CM | POA: Diagnosis not present

## 2023-02-06 DIAGNOSIS — G20A1 Parkinson's disease without dyskinesia, without mention of fluctuations: Secondary | ICD-10-CM | POA: Diagnosis not present

## 2023-02-06 DIAGNOSIS — F32A Depression, unspecified: Secondary | ICD-10-CM | POA: Diagnosis not present

## 2023-02-06 DIAGNOSIS — G629 Polyneuropathy, unspecified: Secondary | ICD-10-CM | POA: Diagnosis not present

## 2023-02-09 ENCOUNTER — Ambulatory Visit: Payer: Self-pay | Admitting: *Deleted

## 2023-02-09 DIAGNOSIS — F32A Depression, unspecified: Secondary | ICD-10-CM | POA: Diagnosis not present

## 2023-02-09 DIAGNOSIS — E538 Deficiency of other specified B group vitamins: Secondary | ICD-10-CM | POA: Diagnosis not present

## 2023-02-09 DIAGNOSIS — I951 Orthostatic hypotension: Secondary | ICD-10-CM | POA: Diagnosis not present

## 2023-02-09 DIAGNOSIS — G629 Polyneuropathy, unspecified: Secondary | ICD-10-CM | POA: Diagnosis not present

## 2023-02-09 DIAGNOSIS — G20A1 Parkinson's disease without dyskinesia, without mention of fluctuations: Secondary | ICD-10-CM | POA: Diagnosis not present

## 2023-02-09 DIAGNOSIS — E785 Hyperlipidemia, unspecified: Secondary | ICD-10-CM | POA: Diagnosis not present

## 2023-02-09 NOTE — Patient Outreach (Signed)
  Care Coordination   Follow Up Visit Note   02/09/2023 Name: Alex Frazier MRN: [id number] DOB: 11-02-1945  Alex Frazier is a 77 y.o. year old male who sees Pickard, Priscille Heidelberg, MD for primary care. I spoke with  Alex Frazier by phone today.  What matters to the patients health and wellness today?  Have bath aide services ([facility]) but will end soon. Interested in more community services, Daughter and grand daughter looked for senior services to confirm the $2300 income is over the limit for services Wife to try 12, 800) [phone] and/or care linx Otherwise Alex Frazier reports Alex Frazier is doing "okay"   Goals Addressed             This Visit's Progress    Care Coordination Activities-Parkinsons disease   On track    Care Coordination Interventions: Evaluation of current treatment plan related to Parkinson's disease and patient's adherence to plan as established by provider  Interventions Today    Flowsheet Row Most Recent Value  Chronic Disease   Chronic disease during today's visit Other  [occasional home care assistance for wife (bathing)]  General Interventions   General Interventions Discussed/Reviewed General Interventions Reviewed, [employer], Doctor Visits  Doctor Visits Discussed/Reviewed Doctor Visits Reviewed, PCP  PCP/Specialist Visits Compliance with follow-up visit  Exercise Interventions   Exercise Discussed/Reviewed Exercise Reviewed, Physical Activity  Education Interventions   Education Provided Provided Education  [211, care linx ,(800) [phone] [facility] locator]  Provided Verbal Education On Community Resources  Mental Health Interventions   Mental Health Discussed/Reviewed Mental Health Reviewed, Coping Strategies             SDOH assessments and interventions completed:  No     Care Coordination Interventions:  Yes, provided   Follow up plan: Follow up call scheduled for 03/11/23    Encounter Outcome:  Pt. Visit Completed     Edi Gorniak L. Noelle Penner, RN, BSN, CCM [facility] Care Management Community Coordinator Office number [phone]

## 2023-02-09 NOTE — Patient Instructions (Signed)
Visit Information  Thank you for taking time to visit with me today. Please don't hesitate to contact me if I can be of assistance to you.   Following are the goals we discussed today:   Goals Addressed             This Visit's Progress    Care Coordination Activities-Parkinsons disease   On track    Care Coordination Interventions: Evaluation of current treatment plan related to Parkinson's disease and patient's adherence to plan as established by provider  Interventions Today    Flowsheet Row Most Recent Value  Chronic Disease   Chronic disease during today's visit Other  [occasional home care assistance for wife (bathing)]  General Interventions   General Interventions Discussed/Reviewed General Interventions Reviewed, [employer], Doctor Visits  Doctor Visits Discussed/Reviewed Doctor Visits Reviewed, PCP  PCP/Specialist Visits Compliance with follow-up visit  Exercise Interventions   Exercise Discussed/Reviewed Exercise Reviewed, Physical Activity  Education Interventions   Education Provided Provided Education  [211, care linx ,[phone] [facility] locator]  Provided Verbal Education On Community Resources  Mental Health Interventions   Mental Health Discussed/Reviewed Mental Health Reviewed, Coping Strategies             Our next appointment is by telephone on 03/11/23 at 1 pm  Please call the care guide team at [phone] if you need to cancel or reschedule your appointment.   If you are experiencing a Mental Health or Behavioral Health Crisis or need someone to talk to, please call the Suicide and Crisis Lifeline: 988 call the [place] National Suicide Prevention Lifeline: 631 876 2022 or TTY: 419-329-0836 TTY (630)183-3612) to talk to a trained counselor call 1-800-273-TALK (toll free, 24 hour hotline) call the [facility]: 9081876836 call 911   Patient verbalizes understanding of instructions and care plan provided today and  agrees to view in MyChart. Active MyChart status and patient understanding of how to access instructions and care plan via MyChart confirmed with patient.     The patient has been provided with contact information for the care management team and has been advised to call with any health related questions or concerns.   Leodis Alcocer L. Noelle Penner, RN, BSN, CCM [facility] Care Management Community Coordinator Office number [phone]

## 2023-02-11 DIAGNOSIS — G20A1 Parkinson's disease without dyskinesia, without mention of fluctuations: Secondary | ICD-10-CM | POA: Diagnosis not present

## 2023-02-11 DIAGNOSIS — E538 Deficiency of other specified B group vitamins: Secondary | ICD-10-CM | POA: Diagnosis not present

## 2023-02-11 DIAGNOSIS — F32A Depression, unspecified: Secondary | ICD-10-CM | POA: Diagnosis not present

## 2023-02-11 DIAGNOSIS — G629 Polyneuropathy, unspecified: Secondary | ICD-10-CM | POA: Diagnosis not present

## 2023-02-11 DIAGNOSIS — I951 Orthostatic hypotension: Secondary | ICD-10-CM | POA: Diagnosis not present

## 2023-02-11 DIAGNOSIS — E785 Hyperlipidemia, unspecified: Secondary | ICD-10-CM | POA: Diagnosis not present

## 2023-02-12 DIAGNOSIS — G629 Polyneuropathy, unspecified: Secondary | ICD-10-CM | POA: Diagnosis not present

## 2023-02-12 DIAGNOSIS — F32A Depression, unspecified: Secondary | ICD-10-CM | POA: Diagnosis not present

## 2023-02-12 DIAGNOSIS — I951 Orthostatic hypotension: Secondary | ICD-10-CM | POA: Diagnosis not present

## 2023-02-12 DIAGNOSIS — G20A1 Parkinson's disease without dyskinesia, without mention of fluctuations: Secondary | ICD-10-CM | POA: Diagnosis not present

## 2023-02-12 DIAGNOSIS — E785 Hyperlipidemia, unspecified: Secondary | ICD-10-CM | POA: Diagnosis not present

## 2023-02-12 DIAGNOSIS — E538 Deficiency of other specified B group vitamins: Secondary | ICD-10-CM | POA: Diagnosis not present

## 2023-02-16 DIAGNOSIS — F32A Depression, unspecified: Secondary | ICD-10-CM | POA: Diagnosis not present

## 2023-02-16 DIAGNOSIS — E785 Hyperlipidemia, unspecified: Secondary | ICD-10-CM | POA: Diagnosis not present

## 2023-02-16 DIAGNOSIS — I951 Orthostatic hypotension: Secondary | ICD-10-CM | POA: Diagnosis not present

## 2023-02-16 DIAGNOSIS — G20A1 Parkinson's disease without dyskinesia, without mention of fluctuations: Secondary | ICD-10-CM | POA: Diagnosis not present

## 2023-02-16 DIAGNOSIS — G629 Polyneuropathy, unspecified: Secondary | ICD-10-CM | POA: Diagnosis not present

## 2023-02-16 DIAGNOSIS — E538 Deficiency of other specified B group vitamins: Secondary | ICD-10-CM | POA: Diagnosis not present

## 2023-02-17 DIAGNOSIS — I951 Orthostatic hypotension: Secondary | ICD-10-CM | POA: Diagnosis not present

## 2023-02-17 DIAGNOSIS — E538 Deficiency of other specified B group vitamins: Secondary | ICD-10-CM | POA: Diagnosis not present

## 2023-02-17 DIAGNOSIS — G629 Polyneuropathy, unspecified: Secondary | ICD-10-CM | POA: Diagnosis not present

## 2023-02-17 DIAGNOSIS — E785 Hyperlipidemia, unspecified: Secondary | ICD-10-CM | POA: Diagnosis not present

## 2023-02-17 DIAGNOSIS — F32A Depression, unspecified: Secondary | ICD-10-CM | POA: Diagnosis not present

## 2023-02-17 DIAGNOSIS — G20A1 Parkinson's disease without dyskinesia, without mention of fluctuations: Secondary | ICD-10-CM | POA: Diagnosis not present

## 2023-02-20 DIAGNOSIS — E538 Deficiency of other specified B group vitamins: Secondary | ICD-10-CM | POA: Diagnosis not present

## 2023-02-20 DIAGNOSIS — G629 Polyneuropathy, unspecified: Secondary | ICD-10-CM | POA: Diagnosis not present

## 2023-02-20 DIAGNOSIS — F32A Depression, unspecified: Secondary | ICD-10-CM | POA: Diagnosis not present

## 2023-02-20 DIAGNOSIS — G20A1 Parkinson's disease without dyskinesia, without mention of fluctuations: Secondary | ICD-10-CM | POA: Diagnosis not present

## 2023-02-20 DIAGNOSIS — E785 Hyperlipidemia, unspecified: Secondary | ICD-10-CM | POA: Diagnosis not present

## 2023-02-20 DIAGNOSIS — I951 Orthostatic hypotension: Secondary | ICD-10-CM | POA: Diagnosis not present

## 2023-02-24 DIAGNOSIS — E785 Hyperlipidemia, unspecified: Secondary | ICD-10-CM | POA: Diagnosis not present

## 2023-02-24 DIAGNOSIS — I951 Orthostatic hypotension: Secondary | ICD-10-CM | POA: Diagnosis not present

## 2023-02-24 DIAGNOSIS — F32A Depression, unspecified: Secondary | ICD-10-CM | POA: Diagnosis not present

## 2023-02-24 DIAGNOSIS — G20A1 Parkinson's disease without dyskinesia, without mention of fluctuations: Secondary | ICD-10-CM | POA: Diagnosis not present

## 2023-02-24 DIAGNOSIS — E538 Deficiency of other specified B group vitamins: Secondary | ICD-10-CM | POA: Diagnosis not present

## 2023-02-24 DIAGNOSIS — G629 Polyneuropathy, unspecified: Secondary | ICD-10-CM | POA: Diagnosis not present

## 2023-02-25 DIAGNOSIS — F32A Depression, unspecified: Secondary | ICD-10-CM | POA: Diagnosis not present

## 2023-02-25 DIAGNOSIS — G20A1 Parkinson's disease without dyskinesia, without mention of fluctuations: Secondary | ICD-10-CM | POA: Diagnosis not present

## 2023-02-25 DIAGNOSIS — I951 Orthostatic hypotension: Secondary | ICD-10-CM | POA: Diagnosis not present

## 2023-02-25 DIAGNOSIS — E538 Deficiency of other specified B group vitamins: Secondary | ICD-10-CM | POA: Diagnosis not present

## 2023-02-25 DIAGNOSIS — G629 Polyneuropathy, unspecified: Secondary | ICD-10-CM | POA: Diagnosis not present

## 2023-02-25 DIAGNOSIS — E785 Hyperlipidemia, unspecified: Secondary | ICD-10-CM | POA: Diagnosis not present

## 2023-03-02 DIAGNOSIS — E538 Deficiency of other specified B group vitamins: Secondary | ICD-10-CM | POA: Diagnosis not present

## 2023-03-02 DIAGNOSIS — F32A Depression, unspecified: Secondary | ICD-10-CM | POA: Diagnosis not present

## 2023-03-02 DIAGNOSIS — G629 Polyneuropathy, unspecified: Secondary | ICD-10-CM | POA: Diagnosis not present

## 2023-03-02 DIAGNOSIS — I951 Orthostatic hypotension: Secondary | ICD-10-CM | POA: Diagnosis not present

## 2023-03-02 DIAGNOSIS — G20A1 Parkinson's disease without dyskinesia, without mention of fluctuations: Secondary | ICD-10-CM | POA: Diagnosis not present

## 2023-03-02 DIAGNOSIS — E785 Hyperlipidemia, unspecified: Secondary | ICD-10-CM | POA: Diagnosis not present

## 2023-03-04 DIAGNOSIS — F32A Depression, unspecified: Secondary | ICD-10-CM | POA: Diagnosis not present

## 2023-03-04 DIAGNOSIS — E538 Deficiency of other specified B group vitamins: Secondary | ICD-10-CM | POA: Diagnosis not present

## 2023-03-04 DIAGNOSIS — E7800 Pure hypercholesterolemia, unspecified: Secondary | ICD-10-CM | POA: Diagnosis not present

## 2023-03-04 DIAGNOSIS — I951 Orthostatic hypotension: Secondary | ICD-10-CM | POA: Diagnosis not present

## 2023-03-04 DIAGNOSIS — G629 Polyneuropathy, unspecified: Secondary | ICD-10-CM | POA: Diagnosis not present

## 2023-03-04 DIAGNOSIS — Z8673 Personal history of transient ischemic attack (TIA), and cerebral infarction without residual deficits: Secondary | ICD-10-CM | POA: Diagnosis not present

## 2023-03-04 DIAGNOSIS — M5030 Other cervical disc degeneration, unspecified cervical region: Secondary | ICD-10-CM | POA: Diagnosis not present

## 2023-03-04 DIAGNOSIS — G20A1 Parkinson's disease without dyskinesia, without mention of fluctuations: Secondary | ICD-10-CM | POA: Diagnosis not present

## 2023-03-04 DIAGNOSIS — Z7952 Long term (current) use of systemic steroids: Secondary | ICD-10-CM | POA: Diagnosis not present

## 2023-03-04 DIAGNOSIS — Z556 Problems related to health literacy: Secondary | ICD-10-CM | POA: Diagnosis not present

## 2023-03-06 DIAGNOSIS — G20A1 Parkinson's disease without dyskinesia, without mention of fluctuations: Secondary | ICD-10-CM | POA: Diagnosis not present

## 2023-03-06 DIAGNOSIS — E7800 Pure hypercholesterolemia, unspecified: Secondary | ICD-10-CM | POA: Diagnosis not present

## 2023-03-06 DIAGNOSIS — I951 Orthostatic hypotension: Secondary | ICD-10-CM | POA: Diagnosis not present

## 2023-03-06 DIAGNOSIS — E538 Deficiency of other specified B group vitamins: Secondary | ICD-10-CM | POA: Diagnosis not present

## 2023-03-06 DIAGNOSIS — F32A Depression, unspecified: Secondary | ICD-10-CM | POA: Diagnosis not present

## 2023-03-06 DIAGNOSIS — G629 Polyneuropathy, unspecified: Secondary | ICD-10-CM | POA: Diagnosis not present

## 2023-03-09 DIAGNOSIS — G629 Polyneuropathy, unspecified: Secondary | ICD-10-CM | POA: Diagnosis not present

## 2023-03-09 DIAGNOSIS — G20A1 Parkinson's disease without dyskinesia, without mention of fluctuations: Secondary | ICD-10-CM | POA: Diagnosis not present

## 2023-03-09 DIAGNOSIS — E538 Deficiency of other specified B group vitamins: Secondary | ICD-10-CM | POA: Diagnosis not present

## 2023-03-09 DIAGNOSIS — I951 Orthostatic hypotension: Secondary | ICD-10-CM | POA: Diagnosis not present

## 2023-03-09 DIAGNOSIS — F32A Depression, unspecified: Secondary | ICD-10-CM | POA: Diagnosis not present

## 2023-03-09 DIAGNOSIS — E7800 Pure hypercholesterolemia, unspecified: Secondary | ICD-10-CM | POA: Diagnosis not present

## 2023-03-10 DIAGNOSIS — E538 Deficiency of other specified B group vitamins: Secondary | ICD-10-CM | POA: Diagnosis not present

## 2023-03-10 DIAGNOSIS — F32A Depression, unspecified: Secondary | ICD-10-CM | POA: Diagnosis not present

## 2023-03-10 DIAGNOSIS — I951 Orthostatic hypotension: Secondary | ICD-10-CM | POA: Diagnosis not present

## 2023-03-10 DIAGNOSIS — G20A1 Parkinson's disease without dyskinesia, without mention of fluctuations: Secondary | ICD-10-CM | POA: Diagnosis not present

## 2023-03-10 DIAGNOSIS — G629 Polyneuropathy, unspecified: Secondary | ICD-10-CM | POA: Diagnosis not present

## 2023-03-10 DIAGNOSIS — E7800 Pure hypercholesterolemia, unspecified: Secondary | ICD-10-CM | POA: Diagnosis not present

## 2023-03-11 ENCOUNTER — Ambulatory Visit: Payer: Self-pay | Admitting: *Deleted

## 2023-03-11 NOTE — Patient Outreach (Signed)
  Care Coordination   Follow Up Visit Note   04/13/2023 Name: Alex Frazier MRN: [id number] DOB: [birth year]  Alex Frazier is a 77 y.o. year old male who sees Pickard, Priscille Heidelberg, MD for primary care. I spoke with Alex Frazier, his wife,  Alex Frazier by phone today.  What matters to the patients health and wellness today?  Having [facility] PT once  a week and aide -twice a week (a maintenance program) Generally visits are around lunch time Wife verified this is a appropriate program and discussed the cost with [facility] staff  Ambulating with 2+ assist    Caregiver has more support of family (grandsons during summer) but remains tired frequently. Not taking time for herself    Hypertension (HTN)  blood pressure (BP) at home 120/70    Goals Addressed             This Visit's Progress    Care Coordination Activities-Parkinsons disease   On track    Care Coordination Interventions: Evaluation of current treatment plan related to Parkinson's disease and patient's adherence to plan as established by provider  Interventions Today    Flowsheet Row Most Recent Value  Chronic Disease   Chronic disease during today's visit Other  [parkinson, home health services, family support more in summer]  General Interventions   General Interventions Discussed/Reviewed [employer], Doctor Visits  Doctor Visits Discussed/Reviewed Doctor Visits Reviewed, Specialist  PCP/Specialist Visits Compliance with follow-up visit  Exercise Interventions   Exercise Discussed/Reviewed Exercise Reviewed, Physical Activity  Mental Health Interventions   Mental Health Discussed/Reviewed Mental Health Reviewed, Coping Strategies  [caregiver support offered]               SDOH assessments and interventions completed:  No     Care Coordination Interventions:  Yes, provided   Follow up plan: Follow up call scheduled for 04/13/23    Encounter Outcome:  Pt. Visit Completed   Kiko Ripp L.  Noelle Penner, RN, BSN, CCM [facility] Care Management Community Coordinator Office number [phone]

## 2023-03-17 DIAGNOSIS — E538 Deficiency of other specified B group vitamins: Secondary | ICD-10-CM | POA: Diagnosis not present

## 2023-03-17 DIAGNOSIS — E7800 Pure hypercholesterolemia, unspecified: Secondary | ICD-10-CM | POA: Diagnosis not present

## 2023-03-17 DIAGNOSIS — G629 Polyneuropathy, unspecified: Secondary | ICD-10-CM | POA: Diagnosis not present

## 2023-03-17 DIAGNOSIS — I951 Orthostatic hypotension: Secondary | ICD-10-CM | POA: Diagnosis not present

## 2023-03-17 DIAGNOSIS — F32A Depression, unspecified: Secondary | ICD-10-CM | POA: Diagnosis not present

## 2023-03-17 DIAGNOSIS — G20A1 Parkinson's disease without dyskinesia, without mention of fluctuations: Secondary | ICD-10-CM | POA: Diagnosis not present

## 2023-03-18 DIAGNOSIS — G20A1 Parkinson's disease without dyskinesia, without mention of fluctuations: Secondary | ICD-10-CM | POA: Diagnosis not present

## 2023-03-18 DIAGNOSIS — I951 Orthostatic hypotension: Secondary | ICD-10-CM | POA: Diagnosis not present

## 2023-03-18 DIAGNOSIS — E538 Deficiency of other specified B group vitamins: Secondary | ICD-10-CM | POA: Diagnosis not present

## 2023-03-18 DIAGNOSIS — F32A Depression, unspecified: Secondary | ICD-10-CM | POA: Diagnosis not present

## 2023-03-18 DIAGNOSIS — E7800 Pure hypercholesterolemia, unspecified: Secondary | ICD-10-CM | POA: Diagnosis not present

## 2023-03-18 DIAGNOSIS — G629 Polyneuropathy, unspecified: Secondary | ICD-10-CM | POA: Diagnosis not present

## 2023-03-19 DIAGNOSIS — G629 Polyneuropathy, unspecified: Secondary | ICD-10-CM | POA: Diagnosis not present

## 2023-03-19 DIAGNOSIS — G20A1 Parkinson's disease without dyskinesia, without mention of fluctuations: Secondary | ICD-10-CM | POA: Diagnosis not present

## 2023-03-19 DIAGNOSIS — E538 Deficiency of other specified B group vitamins: Secondary | ICD-10-CM | POA: Diagnosis not present

## 2023-03-19 DIAGNOSIS — F32A Depression, unspecified: Secondary | ICD-10-CM | POA: Diagnosis not present

## 2023-03-19 DIAGNOSIS — I951 Orthostatic hypotension: Secondary | ICD-10-CM | POA: Diagnosis not present

## 2023-03-19 DIAGNOSIS — E7800 Pure hypercholesterolemia, unspecified: Secondary | ICD-10-CM | POA: Diagnosis not present

## 2023-03-23 DIAGNOSIS — G629 Polyneuropathy, unspecified: Secondary | ICD-10-CM | POA: Diagnosis not present

## 2023-03-23 DIAGNOSIS — E538 Deficiency of other specified B group vitamins: Secondary | ICD-10-CM | POA: Diagnosis not present

## 2023-03-23 DIAGNOSIS — E7800 Pure hypercholesterolemia, unspecified: Secondary | ICD-10-CM | POA: Diagnosis not present

## 2023-03-23 DIAGNOSIS — G20A1 Parkinson's disease without dyskinesia, without mention of fluctuations: Secondary | ICD-10-CM | POA: Diagnosis not present

## 2023-03-23 DIAGNOSIS — I951 Orthostatic hypotension: Secondary | ICD-10-CM | POA: Diagnosis not present

## 2023-03-23 DIAGNOSIS — F32A Depression, unspecified: Secondary | ICD-10-CM | POA: Diagnosis not present

## 2023-03-24 DIAGNOSIS — I951 Orthostatic hypotension: Secondary | ICD-10-CM | POA: Diagnosis not present

## 2023-03-24 DIAGNOSIS — E7800 Pure hypercholesterolemia, unspecified: Secondary | ICD-10-CM | POA: Diagnosis not present

## 2023-03-24 DIAGNOSIS — G629 Polyneuropathy, unspecified: Secondary | ICD-10-CM | POA: Diagnosis not present

## 2023-03-24 DIAGNOSIS — G20A1 Parkinson's disease without dyskinesia, without mention of fluctuations: Secondary | ICD-10-CM | POA: Diagnosis not present

## 2023-03-24 DIAGNOSIS — F32A Depression, unspecified: Secondary | ICD-10-CM | POA: Diagnosis not present

## 2023-03-24 DIAGNOSIS — E538 Deficiency of other specified B group vitamins: Secondary | ICD-10-CM | POA: Diagnosis not present

## 2023-03-25 DIAGNOSIS — G629 Polyneuropathy, unspecified: Secondary | ICD-10-CM | POA: Diagnosis not present

## 2023-03-25 DIAGNOSIS — E538 Deficiency of other specified B group vitamins: Secondary | ICD-10-CM | POA: Diagnosis not present

## 2023-03-25 DIAGNOSIS — F32A Depression, unspecified: Secondary | ICD-10-CM | POA: Diagnosis not present

## 2023-03-25 DIAGNOSIS — E7800 Pure hypercholesterolemia, unspecified: Secondary | ICD-10-CM | POA: Diagnosis not present

## 2023-03-25 DIAGNOSIS — I951 Orthostatic hypotension: Secondary | ICD-10-CM | POA: Diagnosis not present

## 2023-03-25 DIAGNOSIS — G20A1 Parkinson's disease without dyskinesia, without mention of fluctuations: Secondary | ICD-10-CM | POA: Diagnosis not present

## 2023-03-30 DIAGNOSIS — E7800 Pure hypercholesterolemia, unspecified: Secondary | ICD-10-CM | POA: Diagnosis not present

## 2023-03-30 DIAGNOSIS — G629 Polyneuropathy, unspecified: Secondary | ICD-10-CM | POA: Diagnosis not present

## 2023-03-30 DIAGNOSIS — I951 Orthostatic hypotension: Secondary | ICD-10-CM | POA: Diagnosis not present

## 2023-03-30 DIAGNOSIS — G20A1 Parkinson's disease without dyskinesia, without mention of fluctuations: Secondary | ICD-10-CM | POA: Diagnosis not present

## 2023-03-30 DIAGNOSIS — F32A Depression, unspecified: Secondary | ICD-10-CM | POA: Diagnosis not present

## 2023-03-30 DIAGNOSIS — E538 Deficiency of other specified B group vitamins: Secondary | ICD-10-CM | POA: Diagnosis not present

## 2023-03-31 DIAGNOSIS — G20A1 Parkinson's disease without dyskinesia, without mention of fluctuations: Secondary | ICD-10-CM | POA: Diagnosis not present

## 2023-03-31 DIAGNOSIS — F32A Depression, unspecified: Secondary | ICD-10-CM | POA: Diagnosis not present

## 2023-03-31 DIAGNOSIS — I951 Orthostatic hypotension: Secondary | ICD-10-CM | POA: Diagnosis not present

## 2023-03-31 DIAGNOSIS — G629 Polyneuropathy, unspecified: Secondary | ICD-10-CM | POA: Diagnosis not present

## 2023-03-31 DIAGNOSIS — E7800 Pure hypercholesterolemia, unspecified: Secondary | ICD-10-CM | POA: Diagnosis not present

## 2023-03-31 DIAGNOSIS — E538 Deficiency of other specified B group vitamins: Secondary | ICD-10-CM | POA: Diagnosis not present

## 2023-04-02 DIAGNOSIS — F32A Depression, unspecified: Secondary | ICD-10-CM | POA: Diagnosis not present

## 2023-04-02 DIAGNOSIS — E538 Deficiency of other specified B group vitamins: Secondary | ICD-10-CM | POA: Diagnosis not present

## 2023-04-02 DIAGNOSIS — G20A1 Parkinson's disease without dyskinesia, without mention of fluctuations: Secondary | ICD-10-CM | POA: Diagnosis not present

## 2023-04-02 DIAGNOSIS — I951 Orthostatic hypotension: Secondary | ICD-10-CM | POA: Diagnosis not present

## 2023-04-02 DIAGNOSIS — G629 Polyneuropathy, unspecified: Secondary | ICD-10-CM | POA: Diagnosis not present

## 2023-04-02 DIAGNOSIS — E7800 Pure hypercholesterolemia, unspecified: Secondary | ICD-10-CM | POA: Diagnosis not present

## 2023-04-03 DIAGNOSIS — Z8673 Personal history of transient ischemic attack (TIA), and cerebral infarction without residual deficits: Secondary | ICD-10-CM | POA: Diagnosis not present

## 2023-04-03 DIAGNOSIS — G629 Polyneuropathy, unspecified: Secondary | ICD-10-CM | POA: Diagnosis not present

## 2023-04-03 DIAGNOSIS — Z7952 Long term (current) use of systemic steroids: Secondary | ICD-10-CM | POA: Diagnosis not present

## 2023-04-03 DIAGNOSIS — I951 Orthostatic hypotension: Secondary | ICD-10-CM | POA: Diagnosis not present

## 2023-04-03 DIAGNOSIS — E7800 Pure hypercholesterolemia, unspecified: Secondary | ICD-10-CM | POA: Diagnosis not present

## 2023-04-03 DIAGNOSIS — G20A1 Parkinson's disease without dyskinesia, without mention of fluctuations: Secondary | ICD-10-CM | POA: Diagnosis not present

## 2023-04-03 DIAGNOSIS — F32A Depression, unspecified: Secondary | ICD-10-CM | POA: Diagnosis not present

## 2023-04-03 DIAGNOSIS — E538 Deficiency of other specified B group vitamins: Secondary | ICD-10-CM | POA: Diagnosis not present

## 2023-04-03 DIAGNOSIS — Z556 Problems related to health literacy: Secondary | ICD-10-CM | POA: Diagnosis not present

## 2023-04-03 DIAGNOSIS — M5030 Other cervical disc degeneration, unspecified cervical region: Secondary | ICD-10-CM | POA: Diagnosis not present

## 2023-04-07 DIAGNOSIS — E538 Deficiency of other specified B group vitamins: Secondary | ICD-10-CM | POA: Diagnosis not present

## 2023-04-07 DIAGNOSIS — G20A1 Parkinson's disease without dyskinesia, without mention of fluctuations: Secondary | ICD-10-CM | POA: Diagnosis not present

## 2023-04-07 DIAGNOSIS — F32A Depression, unspecified: Secondary | ICD-10-CM | POA: Diagnosis not present

## 2023-04-07 DIAGNOSIS — I951 Orthostatic hypotension: Secondary | ICD-10-CM | POA: Diagnosis not present

## 2023-04-07 DIAGNOSIS — G629 Polyneuropathy, unspecified: Secondary | ICD-10-CM | POA: Diagnosis not present

## 2023-04-07 DIAGNOSIS — E7800 Pure hypercholesterolemia, unspecified: Secondary | ICD-10-CM | POA: Diagnosis not present

## 2023-04-08 DIAGNOSIS — G629 Polyneuropathy, unspecified: Secondary | ICD-10-CM | POA: Diagnosis not present

## 2023-04-08 DIAGNOSIS — G20A1 Parkinson's disease without dyskinesia, without mention of fluctuations: Secondary | ICD-10-CM | POA: Diagnosis not present

## 2023-04-08 DIAGNOSIS — E7800 Pure hypercholesterolemia, unspecified: Secondary | ICD-10-CM | POA: Diagnosis not present

## 2023-04-08 DIAGNOSIS — I951 Orthostatic hypotension: Secondary | ICD-10-CM | POA: Diagnosis not present

## 2023-04-08 DIAGNOSIS — F32A Depression, unspecified: Secondary | ICD-10-CM | POA: Diagnosis not present

## 2023-04-08 DIAGNOSIS — E538 Deficiency of other specified B group vitamins: Secondary | ICD-10-CM | POA: Diagnosis not present

## 2023-04-13 ENCOUNTER — Ambulatory Visit: Payer: Self-pay | Admitting: *Deleted

## 2023-04-13 ENCOUNTER — Telehealth: Payer: Self-pay

## 2023-04-13 NOTE — Patient Instructions (Signed)
Visit Information  Thank you for taking time to visit with me today. Please don't hesitate to contact me if I can be of assistance to you.   Following are the goals we discussed today:   Goals Addressed             This Visit's Progress    Care Coordination Activities-Parkinsons disease   On track    Care Coordination Interventions: Evaluation of current treatment plan related to Parkinson's disease and patient's adherence to plan as established by provider  Interventions Today    Flowsheet Row Most Recent Value  Chronic Disease   Chronic disease during today's visit Other  [parkinson, home health services, family support more in summer]  General Interventions   General Interventions Discussed/Reviewed [employer], Doctor Visits  Doctor Visits Discussed/Reviewed Doctor Visits Reviewed, Specialist  PCP/Specialist Visits Compliance with follow-up visit  Exercise Interventions   Exercise Discussed/Reviewed Exercise Reviewed, Physical Activity  Mental Health Interventions   Mental Health Discussed/Reviewed Mental Health Reviewed, Coping Strategies  [caregiver support offered]               Our next appointment is by telephone on 04/13/23 at 3 pm  Please call the care guide team at [phone] if you need to cancel or reschedule your appointment.   If you are experiencing a Mental Health or Behavioral Health Crisis or need someone to talk to, please call the Suicide and Crisis Lifeline: 988 call the [place] National Suicide Prevention Lifeline: (504)414-1735 or TTY: 440-711-5410 TTY (541)442-7062) to talk to a trained counselor call 1-800-273-TALK (toll free, 24 hour hotline) go to [facility] [address], [place] [phone]) call the [facility] Crisis Line: (432) [phone] call 911   Patient verbalizes understanding of instructions and care plan provided today and agrees to view in MyChart. Active MyChart status and  patient understanding of how to access instructions and care plan via MyChart confirmed with patient.     The patient has been provided with contact information for the care management team and has been advised to call with any health related questions or concerns.    Dayle Sherpa L. Noelle Penner, RN, BSN, CCM [facility] Care Management Community Coordinator Office number [phone]

## 2023-04-13 NOTE — Patient Outreach (Addendum)
  Care Coordination   Follow Up Visit Note   04/13/2023 Name: Alex Frazier MRN: [id number] DOB: May 17, 1946  Alex Frazier is a 77 y.o. year old male who sees Pickard, Priscille Heidelberg, MD for primary care. I spoke with  Alex Frazier by phone today.  What matters to the patients health and wellness today?  "We are getting by"   Walking has not improved per wife Continues to wear a brace on one leg Continues to be seen by [facility] PT,  Alex Frazier   "Lot of things going on around here"  He noted by Mrs Alex Frazier to not  participate or move with confidence when the wife and grandsons assist him like he does when the [facility] PT, Alex Frazier assists  Wife reports he has difficulty moving his feet  Wife reports her grandsons will be returning to school soon and have been available to encourage patient to be more mobile. The home support will decrease  Leg brace discussed  Permission given to speak with pcp RN & MD about a referral to have a new leg brace fitted   Goals Addressed             This Visit's Progress    Care Coordination Activities-Parkinsons disease   On track    Care Coordination Interventions: Evaluation of current treatment plan related to Parkinson's disease and patient's adherence to plan as established by provider  Interventions Today    Flowsheet Row Most Recent Value  Chronic Disease   Chronic disease during today's visit Other  [parkinson , mobility]  General Interventions   General Interventions Discussed/Reviewed General Interventions Reviewed, Durable Medical Equipment (DME), Community Resources, Doctor Visits  Alex Frazier for legs]  Doctor Visits Discussed/Reviewed Doctor Visits Reviewed, Specialist, PCP  Durable Medical Equipment (DME) Other  Alex Frazier for legs]  PCP/Specialist Visits Contact provider for referral to  [facility] provider for referral to PCP  [facility] for brace as suggested by [facility] PT Alex Frazier]  Communication with PCP/Specialists, RN  [Inquired if pcp would  assist with Referral for patient to get a new leg brace with recommendation of his [facility] PT staff Left a message with Alex Frazier for Alex Frazier to call RN CM or PCP RN [name]]  Exercise Interventions   Exercise Discussed/Reviewed Exercise Reviewed, Physical Activity, Assistive device use and maintanence  Physical Activity Discussed/Reviewed Physical Activity Reviewed, Home Exercise Program (HEP), Types of exercise  [facility] HEALTH PHYSICAL THERAPY (PT)]  Education Interventions   Education Provided Provided Education  [Parkinson progression]  Provided Verbal Education On [employer], Development worker, community  Mental Health Interventions   Mental Health Discussed/Reviewed Mental Health Reviewed, Coping Strategies  Safety Interventions   Safety Discussed/Reviewed Safety Reviewed, Fall Risk               SDOH assessments and interventions completed:  Yes  SDOH Interventions Today    Flowsheet Row Most Recent Value  SDOH Interventions   Health Literacy Interventions Other (Comment), Patient Unable to Respond  [speak with wife Alex Frazier as patient generally does not respond]        Care Coordination Interventions:  Yes, provided   Follow up plan: Follow up call scheduled for 05/14/23    Encounter Outcome:  Pt. Visit Completed   Alex Frazier L. Noelle Penner, RN, BSN, CCM [facility] Care Management Community Coordinator Office number [phone]

## 2023-04-13 NOTE — Telephone Encounter (Signed)
Staff message from Edd Arbour, RN with [facility]:  Germaine Pomfret, Would it be okay with Dr Tanya Nones to write a referral for Mr Teply to be seen to get another brace if [name] the [facility] PT states he needs one or would he be required to have an office visit. He was last seen in April 2024 next October but it is so hard for his wife to get him out of the home to the office Could you ask Dr Tanya Nones for me?

## 2023-04-13 NOTE — Patient Instructions (Addendum)
Visit Information  Thank you for taking time to visit with me today. Please don't hesitate to contact me if I can be of assistance to you.   Following are the goals we discussed today:   Goals Addressed             This Visit's Progress    Care Coordination Activities-Parkinsons disease   On track    Care Coordination Interventions: Evaluation of current treatment plan related to Parkinson's disease and patient's adherence to plan as established by provider  Interventions Today    Flowsheet Row Most Recent Value  Chronic Disease   Chronic disease during today's visit Other  [parkinson , mobility]  General Interventions   General Interventions Discussed/Reviewed General Interventions Reviewed, Durable Medical Equipment (DME), Community Resources, Doctor Visits  [name] for legs]  Doctor Visits Discussed/Reviewed Doctor Visits Reviewed, Specialist, PCP  Durable Medical Equipment (DME) Other  [name] for legs]  PCP/Specialist Visits Contact provider for referral to  [facility] provider for referral to PCP  [facility] for brace as suggested by [facility] PT [name]]  Communication with PCP/Specialists, RN  [Inquired if pcp would assist with Referral for patient to get a new leg brace with recommendation of his [facility] PT staff Left a message with [name] for [name] to call RN CM or PCP RN [name]]  Exercise Interventions   Exercise Discussed/Reviewed Exercise Reviewed, Physical Activity, Assistive device use and maintanence  Physical Activity Discussed/Reviewed Physical Activity Reviewed, Home Exercise Program (HEP), Types of exercise  [facility] HEALTH PHYSICAL THERAPY (PT)]  Education Interventions   Education Provided Provided Education  [Parkinson progression]  Provided Verbal Education On [employer], Development worker, community  Mental Health Interventions   Mental Health Discussed/Reviewed Mental Health Reviewed, Coping Strategies  Safety Interventions   Safety Discussed/Reviewed Safety  Reviewed, Fall Risk               Our next appointment is by telephone on 05/14/23 at 3:15 pm  Please call the care guide team at [phone] if you need to cancel or reschedule your appointment.   If you are experiencing a Mental Health or Behavioral Health Crisis or need someone to talk to, please call the Suicide and Crisis Lifeline: 988 call the [place] National Suicide Prevention Lifeline: 279-668-7765 or TTY: 210 482 2730 TTY 506-327-1539) to talk to a trained counselor call 1-800-273-TALK (toll free, 24 hour hotline) go to [facility] [address], [place] [phone]) call 911   Patient verbalizes understanding of instructions and care plan provided today and agrees to view in MyChart. Active MyChart status and patient understanding of how to access instructions and care plan via MyChart confirmed with patient.     The patient has been provided with contact information for the care management team and has been advised to call with any health related questions or concerns.   Korissa Horsford L. Noelle Penner, RN, BSN, CCM [facility] Care Management Community Coordinator Office number [phone]

## 2023-04-14 DIAGNOSIS — F32A Depression, unspecified: Secondary | ICD-10-CM | POA: Diagnosis not present

## 2023-04-14 DIAGNOSIS — E538 Deficiency of other specified B group vitamins: Secondary | ICD-10-CM | POA: Diagnosis not present

## 2023-04-14 DIAGNOSIS — E7800 Pure hypercholesterolemia, unspecified: Secondary | ICD-10-CM | POA: Diagnosis not present

## 2023-04-14 DIAGNOSIS — I951 Orthostatic hypotension: Secondary | ICD-10-CM | POA: Diagnosis not present

## 2023-04-14 DIAGNOSIS — G20A1 Parkinson's disease without dyskinesia, without mention of fluctuations: Secondary | ICD-10-CM | POA: Diagnosis not present

## 2023-04-14 DIAGNOSIS — G629 Polyneuropathy, unspecified: Secondary | ICD-10-CM | POA: Diagnosis not present

## 2023-04-15 DIAGNOSIS — G629 Polyneuropathy, unspecified: Secondary | ICD-10-CM | POA: Diagnosis not present

## 2023-04-15 DIAGNOSIS — I951 Orthostatic hypotension: Secondary | ICD-10-CM | POA: Diagnosis not present

## 2023-04-15 DIAGNOSIS — F32A Depression, unspecified: Secondary | ICD-10-CM | POA: Diagnosis not present

## 2023-04-15 DIAGNOSIS — G20A1 Parkinson's disease without dyskinesia, without mention of fluctuations: Secondary | ICD-10-CM | POA: Diagnosis not present

## 2023-04-15 DIAGNOSIS — E538 Deficiency of other specified B group vitamins: Secondary | ICD-10-CM | POA: Diagnosis not present

## 2023-04-15 DIAGNOSIS — E7800 Pure hypercholesterolemia, unspecified: Secondary | ICD-10-CM | POA: Diagnosis not present

## 2023-04-16 ENCOUNTER — Other Ambulatory Visit: Payer: Self-pay

## 2023-04-16 DIAGNOSIS — G20A1 Parkinson's disease without dyskinesia, without mention of fluctuations: Secondary | ICD-10-CM

## 2023-04-16 DIAGNOSIS — M25376 Other instability, unspecified foot: Secondary | ICD-10-CM

## 2023-04-16 DIAGNOSIS — G20C Parkinsonism, unspecified: Secondary | ICD-10-CM

## 2023-04-16 NOTE — Telephone Encounter (Signed)
Received call from [place] with [facility]; returning call [id number]. Patient needs a referral for an eval for a prosthetic brace (drags his feet a lot and feet turn in.)  Please advise at [phone].

## 2023-04-17 DIAGNOSIS — G629 Polyneuropathy, unspecified: Secondary | ICD-10-CM | POA: Diagnosis not present

## 2023-04-17 DIAGNOSIS — F32A Depression, unspecified: Secondary | ICD-10-CM | POA: Diagnosis not present

## 2023-04-17 DIAGNOSIS — G20A1 Parkinson's disease without dyskinesia, without mention of fluctuations: Secondary | ICD-10-CM | POA: Diagnosis not present

## 2023-04-17 DIAGNOSIS — I951 Orthostatic hypotension: Secondary | ICD-10-CM | POA: Diagnosis not present

## 2023-04-17 DIAGNOSIS — E7800 Pure hypercholesterolemia, unspecified: Secondary | ICD-10-CM | POA: Diagnosis not present

## 2023-04-17 DIAGNOSIS — E538 Deficiency of other specified B group vitamins: Secondary | ICD-10-CM | POA: Diagnosis not present

## 2023-04-20 DIAGNOSIS — G20A1 Parkinson's disease without dyskinesia, without mention of fluctuations: Secondary | ICD-10-CM | POA: Diagnosis not present

## 2023-04-20 DIAGNOSIS — E538 Deficiency of other specified B group vitamins: Secondary | ICD-10-CM | POA: Diagnosis not present

## 2023-04-20 DIAGNOSIS — F32A Depression, unspecified: Secondary | ICD-10-CM | POA: Diagnosis not present

## 2023-04-20 DIAGNOSIS — I951 Orthostatic hypotension: Secondary | ICD-10-CM | POA: Diagnosis not present

## 2023-04-20 DIAGNOSIS — E7800 Pure hypercholesterolemia, unspecified: Secondary | ICD-10-CM | POA: Diagnosis not present

## 2023-04-20 DIAGNOSIS — G629 Polyneuropathy, unspecified: Secondary | ICD-10-CM | POA: Diagnosis not present

## 2023-04-21 DIAGNOSIS — G20A1 Parkinson's disease without dyskinesia, without mention of fluctuations: Secondary | ICD-10-CM | POA: Diagnosis not present

## 2023-04-21 DIAGNOSIS — E7800 Pure hypercholesterolemia, unspecified: Secondary | ICD-10-CM | POA: Diagnosis not present

## 2023-04-21 DIAGNOSIS — F32A Depression, unspecified: Secondary | ICD-10-CM | POA: Diagnosis not present

## 2023-04-21 DIAGNOSIS — G629 Polyneuropathy, unspecified: Secondary | ICD-10-CM | POA: Diagnosis not present

## 2023-04-21 DIAGNOSIS — I951 Orthostatic hypotension: Secondary | ICD-10-CM | POA: Diagnosis not present

## 2023-04-21 DIAGNOSIS — E538 Deficiency of other specified B group vitamins: Secondary | ICD-10-CM | POA: Diagnosis not present

## 2023-04-22 DIAGNOSIS — G20A1 Parkinson's disease without dyskinesia, without mention of fluctuations: Secondary | ICD-10-CM | POA: Diagnosis not present

## 2023-04-22 DIAGNOSIS — I951 Orthostatic hypotension: Secondary | ICD-10-CM | POA: Diagnosis not present

## 2023-04-22 DIAGNOSIS — F32A Depression, unspecified: Secondary | ICD-10-CM | POA: Diagnosis not present

## 2023-04-22 DIAGNOSIS — E538 Deficiency of other specified B group vitamins: Secondary | ICD-10-CM | POA: Diagnosis not present

## 2023-04-22 DIAGNOSIS — G629 Polyneuropathy, unspecified: Secondary | ICD-10-CM | POA: Diagnosis not present

## 2023-04-22 DIAGNOSIS — E7800 Pure hypercholesterolemia, unspecified: Secondary | ICD-10-CM | POA: Diagnosis not present

## 2023-04-30 DIAGNOSIS — I951 Orthostatic hypotension: Secondary | ICD-10-CM | POA: Diagnosis not present

## 2023-04-30 DIAGNOSIS — E7800 Pure hypercholesterolemia, unspecified: Secondary | ICD-10-CM | POA: Diagnosis not present

## 2023-04-30 DIAGNOSIS — G629 Polyneuropathy, unspecified: Secondary | ICD-10-CM | POA: Diagnosis not present

## 2023-04-30 DIAGNOSIS — F32A Depression, unspecified: Secondary | ICD-10-CM | POA: Diagnosis not present

## 2023-04-30 DIAGNOSIS — E538 Deficiency of other specified B group vitamins: Secondary | ICD-10-CM | POA: Diagnosis not present

## 2023-04-30 DIAGNOSIS — G20A1 Parkinson's disease without dyskinesia, without mention of fluctuations: Secondary | ICD-10-CM | POA: Diagnosis not present

## 2023-05-01 DIAGNOSIS — G20A1 Parkinson's disease without dyskinesia, without mention of fluctuations: Secondary | ICD-10-CM | POA: Diagnosis not present

## 2023-05-01 DIAGNOSIS — E7800 Pure hypercholesterolemia, unspecified: Secondary | ICD-10-CM | POA: Diagnosis not present

## 2023-05-01 DIAGNOSIS — E538 Deficiency of other specified B group vitamins: Secondary | ICD-10-CM | POA: Diagnosis not present

## 2023-05-01 DIAGNOSIS — G629 Polyneuropathy, unspecified: Secondary | ICD-10-CM | POA: Diagnosis not present

## 2023-05-01 DIAGNOSIS — I951 Orthostatic hypotension: Secondary | ICD-10-CM | POA: Diagnosis not present

## 2023-05-01 DIAGNOSIS — F32A Depression, unspecified: Secondary | ICD-10-CM | POA: Diagnosis not present

## 2023-05-03 DIAGNOSIS — M5030 Other cervical disc degeneration, unspecified cervical region: Secondary | ICD-10-CM | POA: Diagnosis not present

## 2023-05-03 DIAGNOSIS — F32A Depression, unspecified: Secondary | ICD-10-CM | POA: Diagnosis not present

## 2023-05-03 DIAGNOSIS — E7800 Pure hypercholesterolemia, unspecified: Secondary | ICD-10-CM | POA: Diagnosis not present

## 2023-05-03 DIAGNOSIS — Z7952 Long term (current) use of systemic steroids: Secondary | ICD-10-CM | POA: Diagnosis not present

## 2023-05-03 DIAGNOSIS — E538 Deficiency of other specified B group vitamins: Secondary | ICD-10-CM | POA: Diagnosis not present

## 2023-05-03 DIAGNOSIS — Z556 Problems related to health literacy: Secondary | ICD-10-CM | POA: Diagnosis not present

## 2023-05-03 DIAGNOSIS — Z8673 Personal history of transient ischemic attack (TIA), and cerebral infarction without residual deficits: Secondary | ICD-10-CM | POA: Diagnosis not present

## 2023-05-03 DIAGNOSIS — G20A1 Parkinson's disease without dyskinesia, without mention of fluctuations: Secondary | ICD-10-CM | POA: Diagnosis not present

## 2023-05-03 DIAGNOSIS — G629 Polyneuropathy, unspecified: Secondary | ICD-10-CM | POA: Diagnosis not present

## 2023-05-03 DIAGNOSIS — I951 Orthostatic hypotension: Secondary | ICD-10-CM | POA: Diagnosis not present

## 2023-05-06 DIAGNOSIS — I951 Orthostatic hypotension: Secondary | ICD-10-CM | POA: Diagnosis not present

## 2023-05-06 DIAGNOSIS — G629 Polyneuropathy, unspecified: Secondary | ICD-10-CM | POA: Diagnosis not present

## 2023-05-06 DIAGNOSIS — E7800 Pure hypercholesterolemia, unspecified: Secondary | ICD-10-CM | POA: Diagnosis not present

## 2023-05-06 DIAGNOSIS — E538 Deficiency of other specified B group vitamins: Secondary | ICD-10-CM | POA: Diagnosis not present

## 2023-05-06 DIAGNOSIS — F32A Depression, unspecified: Secondary | ICD-10-CM | POA: Diagnosis not present

## 2023-05-06 DIAGNOSIS — G20A1 Parkinson's disease without dyskinesia, without mention of fluctuations: Secondary | ICD-10-CM | POA: Diagnosis not present

## 2023-05-08 DIAGNOSIS — E7800 Pure hypercholesterolemia, unspecified: Secondary | ICD-10-CM | POA: Diagnosis not present

## 2023-05-08 DIAGNOSIS — G20A1 Parkinson's disease without dyskinesia, without mention of fluctuations: Secondary | ICD-10-CM | POA: Diagnosis not present

## 2023-05-08 DIAGNOSIS — E538 Deficiency of other specified B group vitamins: Secondary | ICD-10-CM | POA: Diagnosis not present

## 2023-05-08 DIAGNOSIS — G629 Polyneuropathy, unspecified: Secondary | ICD-10-CM | POA: Diagnosis not present

## 2023-05-08 DIAGNOSIS — I951 Orthostatic hypotension: Secondary | ICD-10-CM | POA: Diagnosis not present

## 2023-05-08 DIAGNOSIS — F32A Depression, unspecified: Secondary | ICD-10-CM | POA: Diagnosis not present

## 2023-05-11 DIAGNOSIS — F32A Depression, unspecified: Secondary | ICD-10-CM | POA: Diagnosis not present

## 2023-05-11 DIAGNOSIS — I951 Orthostatic hypotension: Secondary | ICD-10-CM | POA: Diagnosis not present

## 2023-05-11 DIAGNOSIS — E7800 Pure hypercholesterolemia, unspecified: Secondary | ICD-10-CM | POA: Diagnosis not present

## 2023-05-11 DIAGNOSIS — G20A1 Parkinson's disease without dyskinesia, without mention of fluctuations: Secondary | ICD-10-CM | POA: Diagnosis not present

## 2023-05-11 DIAGNOSIS — E538 Deficiency of other specified B group vitamins: Secondary | ICD-10-CM | POA: Diagnosis not present

## 2023-05-11 DIAGNOSIS — G629 Polyneuropathy, unspecified: Secondary | ICD-10-CM | POA: Diagnosis not present

## 2023-05-13 DIAGNOSIS — E7800 Pure hypercholesterolemia, unspecified: Secondary | ICD-10-CM | POA: Diagnosis not present

## 2023-05-13 DIAGNOSIS — G629 Polyneuropathy, unspecified: Secondary | ICD-10-CM | POA: Diagnosis not present

## 2023-05-13 DIAGNOSIS — G20A1 Parkinson's disease without dyskinesia, without mention of fluctuations: Secondary | ICD-10-CM | POA: Diagnosis not present

## 2023-05-13 DIAGNOSIS — F32A Depression, unspecified: Secondary | ICD-10-CM | POA: Diagnosis not present

## 2023-05-13 DIAGNOSIS — I951 Orthostatic hypotension: Secondary | ICD-10-CM | POA: Diagnosis not present

## 2023-05-13 DIAGNOSIS — E538 Deficiency of other specified B group vitamins: Secondary | ICD-10-CM | POA: Diagnosis not present

## 2023-05-14 ENCOUNTER — Emergency Department (HOSPITAL_COMMUNITY): Payer: Medicare Other

## 2023-05-14 ENCOUNTER — Encounter (HOSPITAL_COMMUNITY): Payer: Self-pay | Admitting: Emergency Medicine

## 2023-05-14 ENCOUNTER — Ambulatory Visit: Payer: Self-pay | Admitting: *Deleted

## 2023-05-14 ENCOUNTER — Inpatient Hospital Stay (HOSPITAL_COMMUNITY)
Admission: EM | Admit: 2023-05-14 | Discharge: 2023-05-17 | DRG: 178 | Disposition: A | Discharge status: Home Health | Payer: Medicare Other | Attending: Internal Medicine | Admitting: Internal Medicine

## 2023-05-14 ENCOUNTER — Other Ambulatory Visit: Payer: Self-pay

## 2023-05-14 DIAGNOSIS — Z6824 Body mass index (BMI) 24.0-24.9, adult: Secondary | ICD-10-CM

## 2023-05-14 DIAGNOSIS — R030 Elevated blood-pressure reading, without diagnosis of hypertension: Secondary | ICD-10-CM

## 2023-05-14 DIAGNOSIS — R9431 Abnormal electrocardiogram [ECG] [EKG]: Secondary | ICD-10-CM | POA: Diagnosis not present

## 2023-05-14 DIAGNOSIS — Z888 Allergy status to other drugs, medicaments and biological substances status: Secondary | ICD-10-CM | POA: Diagnosis not present

## 2023-05-14 DIAGNOSIS — I160 Hypertensive urgency: Secondary | ICD-10-CM | POA: Diagnosis present

## 2023-05-14 DIAGNOSIS — Z820 Family history of epilepsy and other diseases of the nervous system: Secondary | ICD-10-CM

## 2023-05-14 DIAGNOSIS — E785 Hyperlipidemia, unspecified: Secondary | ICD-10-CM | POA: Diagnosis not present

## 2023-05-14 DIAGNOSIS — U071 COVID-19: Principal | ICD-10-CM

## 2023-05-14 DIAGNOSIS — Z8673 Personal history of transient ischemic attack (TIA), and cerebral infarction without residual deficits: Secondary | ICD-10-CM | POA: Diagnosis not present

## 2023-05-14 DIAGNOSIS — Z833 Family history of diabetes mellitus: Secondary | ICD-10-CM | POA: Diagnosis not present

## 2023-05-14 DIAGNOSIS — E538 Deficiency of other specified B group vitamins: Secondary | ICD-10-CM | POA: Diagnosis present

## 2023-05-14 DIAGNOSIS — G629 Polyneuropathy, unspecified: Secondary | ICD-10-CM | POA: Diagnosis present

## 2023-05-14 DIAGNOSIS — I672 Cerebral atherosclerosis: Secondary | ICD-10-CM | POA: Diagnosis not present

## 2023-05-14 DIAGNOSIS — Z91048 Other nonmedicinal substance allergy status: Secondary | ICD-10-CM

## 2023-05-14 DIAGNOSIS — I10 Essential (primary) hypertension: Secondary | ICD-10-CM | POA: Diagnosis not present

## 2023-05-14 DIAGNOSIS — F39 Unspecified mood [affective] disorder: Secondary | ICD-10-CM | POA: Diagnosis present

## 2023-05-14 DIAGNOSIS — G20A1 Parkinson's disease without dyskinesia, without mention of fluctuations: Secondary | ICD-10-CM | POA: Diagnosis not present

## 2023-05-14 DIAGNOSIS — I951 Orthostatic hypotension: Secondary | ICD-10-CM | POA: Diagnosis not present

## 2023-05-14 DIAGNOSIS — E872 Acidosis, unspecified: Secondary | ICD-10-CM | POA: Diagnosis not present

## 2023-05-14 DIAGNOSIS — A419 Sepsis, unspecified organism: Secondary | ICD-10-CM | POA: Diagnosis not present

## 2023-05-14 DIAGNOSIS — Z79899 Other long term (current) drug therapy: Secondary | ICD-10-CM | POA: Diagnosis not present

## 2023-05-14 DIAGNOSIS — Z8249 Family history of ischemic heart disease and other diseases of the circulatory system: Secondary | ICD-10-CM | POA: Diagnosis not present

## 2023-05-14 DIAGNOSIS — N390 Urinary tract infection, site not specified: Secondary | ICD-10-CM | POA: Diagnosis present

## 2023-05-14 DIAGNOSIS — B964 Proteus (mirabilis) (morganii) as the cause of diseases classified elsewhere: Secondary | ICD-10-CM | POA: Diagnosis present

## 2023-05-14 DIAGNOSIS — Z886 Allergy status to analgesic agent status: Secondary | ICD-10-CM | POA: Diagnosis not present

## 2023-05-14 DIAGNOSIS — Z0389 Encounter for observation for other suspected diseases and conditions ruled out: Secondary | ICD-10-CM | POA: Diagnosis not present

## 2023-05-14 DIAGNOSIS — Z7952 Long term (current) use of systemic steroids: Secondary | ICD-10-CM

## 2023-05-14 DIAGNOSIS — R410 Disorientation, unspecified: Secondary | ICD-10-CM | POA: Diagnosis not present

## 2023-05-14 DIAGNOSIS — R4182 Altered mental status, unspecified: Principal | ICD-10-CM

## 2023-05-14 DIAGNOSIS — R54 Age-related physical debility: Secondary | ICD-10-CM | POA: Diagnosis present

## 2023-05-14 DIAGNOSIS — I700 Atherosclerosis of aorta: Secondary | ICD-10-CM | POA: Diagnosis not present

## 2023-05-14 DIAGNOSIS — N3000 Acute cystitis without hematuria: Secondary | ICD-10-CM | POA: Diagnosis not present

## 2023-05-14 DIAGNOSIS — R531 Weakness: Secondary | ICD-10-CM | POA: Diagnosis not present

## 2023-05-14 DIAGNOSIS — R509 Fever, unspecified: Secondary | ICD-10-CM | POA: Diagnosis not present

## 2023-05-14 LAB — I-STAT CG4 LACTIC ACID, ED
Lactic Acid, Venous: 2 mmol/L (ref 0.5–1.9)
Lactic Acid, Venous: 1.1 mmol/L (ref 0.5–1.9)

## 2023-05-14 LAB — COMPREHENSIVE METABOLIC PANEL
ALT: 13 U/L (ref 0–44)
AST: 27 U/L (ref 15–41)
Albumin: 4.1 g/dL (ref 3.5–5.0)
Alkaline Phosphatase: 110 U/L (ref 38–126)
Anion gap: 11 (ref 5–15)
BUN: 18 mg/dL (ref 8–23)
CO2: 25 mmol/L (ref 22–32)
Calcium: 8.8 mg/dL — ABNORMAL LOW (ref 8.9–10.3)
Chloride: 99 mmol/L (ref 98–111)
Creatinine, Ser: 0.93 mg/dL (ref 0.61–1.24)
GFR, Estimated: >60 mL/min (ref >60)
Glucose, Bld: 120 mg/dL — ABNORMAL HIGH (ref 70–99)
Potassium: 3.9 mmol/L (ref 3.5–5.1)
Sodium: 135 mmol/L (ref 135–145)
Total Bilirubin: 0.6 mg/dL (ref 0.3–1.2)
Total Protein: 7.6 g/dL (ref 6.5–8.1)

## 2023-05-14 LAB — URINALYSIS, W/ REFLEX TO CULTURE (INFECTION SUSPECTED)
Bilirubin Urine: NEGATIVE
Glucose, UA: NEGATIVE mg/dL
Ketones, ur: 5 mg/dL — AB
Nitrite: NEGATIVE
Protein, ur: 100 mg/dL — AB
Specific Gravity, Urine: 1.02 (ref 1.005–1.030)
WBC, UA: >50 WBC/hpf (ref 0–5)
pH: 8 (ref 5.0–8.0)

## 2023-05-14 LAB — CBC WITH DIFFERENTIAL/PLATELET
Abs Immature Granulocytes: 0.03 10*3/uL (ref 0.00–0.07)
Basophils Absolute: 0.1 10*3/uL (ref 0.0–0.1)
Basophils Relative: 1 %
Eosinophils Absolute: 0 10*3/uL (ref 0.0–0.5)
Eosinophils Relative: 0 %
HCT: 45.1 % (ref 39.0–52.0)
Hemoglobin: 14.7 g/dL (ref 13.0–17.0)
Immature Granulocytes: 1 %
Lymphocytes Relative: 8 %
Lymphs Abs: 0.5 10*3/uL — ABNORMAL LOW (ref 0.7–4.0)
MCH: 29.3 pg (ref 26.0–34.0)
MCHC: 32.6 g/dL (ref 30.0–36.0)
MCV: 90 fL (ref 80.0–100.0)
Monocytes Absolute: 0.9 10*3/uL (ref 0.1–1.0)
Monocytes Relative: 13 %
Neutro Abs: 5 10*3/uL (ref 1.7–7.7)
Neutrophils Relative %: 77 %
Platelets: 221 10*3/uL (ref 150–400)
RBC: 5.01 MIL/uL (ref 4.22–5.81)
RDW: 13.8 % (ref 11.5–15.5)
WBC: 6.5 10*3/uL (ref 4.0–10.5)
nRBC: 0 % (ref 0.0–0.2)

## 2023-05-14 LAB — RESP PANEL BY RT-PCR (RSV, FLU A&B, COVID)  RVPGX2
Influenza A by PCR: NEGATIVE
Influenza B by PCR: NEGATIVE
Resp Syncytial Virus by PCR: NEGATIVE
SARS Coronavirus 2 by RT PCR: POSITIVE — AB

## 2023-05-14 LAB — APTT: aPTT: 28 s (ref 24–36)

## 2023-05-14 LAB — PROTIME-INR
INR: 1 (ref 0.8–1.2)
Prothrombin Time: 13.8 s (ref 11.4–15.2)

## 2023-05-14 MED ORDER — SODIUM CHLORIDE 0.9 % IV SOLN
2.0000 g | INTRAVENOUS | Status: DC
  Administered 2023-05-14 – 2023-05-16 (×3): 2 g via INTRAVENOUS
  Filled 2023-05-14 (×3): qty 20

## 2023-05-14 MED ORDER — LACTATED RINGERS IV SOLN: INTRAVENOUS | Status: DC

## 2023-05-14 MED ORDER — ACETAMINOPHEN 325 MG PO TABS
650.0000 mg | ORAL_TABLET | Freq: Once | ORAL | Status: AC
  Administered 2023-05-14: 650 mg via ORAL
  Filled 2023-05-14: qty 2

## 2023-05-14 NOTE — Patient Outreach (Signed)
Care Coordination   Follow Up Visit Note   05/15/2023 Name: Alex Frazier MRN: [id number] DOB: 12-02-1945  Alex Frazier is a 77 y.o. year old male who sees Pickard, Priscille Heidelberg, MD for primary care. I spoke with  Alex Frazier by phone today.  What matters to the patients health and wellness today?  "Not a good day" "it has been a rough day" She reports patient with concerns with remembering how to move his legs. Alex Frazier had to get a male to come in to help get him up after he fell today With assessment Alex Frazier reports she does not believe Alex Frazier has a urinary tract infection (UTI) but is most likely dehydrated as she is having difficultly getting fluids in him.  He wears a depends and obtaining a urine specimen would not be possible. She denies urine color change, urine odor & Alex Frazier is not able to let her known if he is in pain. She voiced understanding of use of a condom cath or urinary tract infection test strips from her local drug store ([employer] or the store brand name one)   Alex Frazier reports not knowing about or having a hoyer lift to assist her with getting Alex Frazier up out of the chair or bed on bad days to prevent injury to her or him After a Alex Frazier lift was described she, voiced some understanding of what it may be. She states she will look it up online  .   Alex Frazier reports being exhausted today. Agreed to conclude the call and she agreed to follow up outreach    Goals Addressed             This Visit's Progress    Home management of falls, poor ambulation, recurrent uti , parkinson (Care coordination services)   Not on track    Care Coordination Interventions: Evaluation of current treatment plan related to Parkinson's disease, UTI and patient's adherence to plan as established by provider  Care giver goal: Alex Frazier will check patient for possible UTI using purchased test strips and seek care as needed   Interventions Today    Flowsheet Row Most Recent Value   Chronic Disease   Chronic disease during today's visit Other  [UTI symptoms, how to check uti on a patient wearing depends, hoyer lift]  General Interventions   General Interventions Discussed/Reviewed General Interventions Reviewed, Doctor Visits, Durable Medical Equipment (DME), Sick Day Rules  Doctor Visits Discussed/Reviewed Doctor Visits Reviewed, PCP  Durable Medical Equipment (DME) Other  [hoyer lift discussed]  PCP/Specialist Visits Compliance with follow-up visit  Exercise Interventions   Exercise Discussed/Reviewed Exercise Reviewed, Physical Activity, Assistive device use and maintanence  [discussed hoyer lift to use during the times Alex Alex Frazier has weakness]  Physical Activity Discussed/Reviewed Physical Activity Reviewed  Education Interventions   Education Provided Provided Education, Provided Web-based Education  [web base education on UTI, Sepsis]  Nutrition Interventions   Nutrition Discussed/Reviewed Nutrition Reviewed, Fluid intake, Supplemental nutrition  Safety Interventions   Safety Discussed/Reviewed Safety Reviewed, Home Safety  Home Safety Assistive Devices  [discussed use of hoyer lift to prevent patient & caregiver injury]               SDOH assessments and interventions completed:  No     Care Coordination Interventions:  Yes, provided   Follow up plan: Follow up call scheduled for 05/21/23    Encounter Outcome:  Patient Visit Completed   Cala Bradford L. Noelle Penner, RN,  BSN, CCM, Care Management Coordinator [phone]

## 2023-05-14 NOTE — ED Triage Notes (Signed)
Patient presents form home due to a positive at home UTI test. He has been more confused and fatigued the past few days. Family was concern and sent him here.    EMS vitals: 190/90 BP 80 HR 116 CBG 97% SPO2 on room air 20 RR

## 2023-05-14 NOTE — Progress Notes (Signed)
Elink monitoring for the code sepsis protocol.  

## 2023-05-14 NOTE — H&P (Signed)
History and Physical    Patient: Alex Frazier WUJ:[id number] DOB: [birth year] DOA: 05/14/2023 DOS: the patient was seen and examined on 05/15/2023 PCP: Donita Brooks, MD  Patient coming from: Home  Chief Complaint:  Chief Complaint  Patient presents with   Altered Mental Status   Fatigue   HPI: SHIZUO GIACHETTI is a 77 y.o. male with medical history significant of parkinson' disease, HLD, orthostatic hypotension on midodrine and fludrocortisone who presents with AMS.   Hx obtained from ED documentation and wife over the phone. Wife reports patient felt flushed today and had fever of 102 F at home. Blood pressure was high which is atypical for him since he is on midodrine and fludrocortisone for hypotension. Lost his mobility starting yesterday and could not get out of bed or walk. Fell in the bathroom today going from sink to toilet and had to get help to get up.Has been very weak. Uses rollator at baseline.  He was not talking as much today. Eyes were more red. Had positive urine dipstick at home.   In the ED, he was febrile up to 102.8 rectally, initially hypertensive up to 224/102 which resolved with fluids.  CBC with no leukocytosis or anemia.  CMP notable for glucose of 120 and hypocalcemia of 8.8.  Urinalysis with negative nitrite, moderate leukocyte and many bacteria.  COVID PCR also positive.  Chest x-ray showed no acute process.  CT head was negative    Review of Systems: unable to review all systems due to the inability of the patient to answer questions. Past Medical History:  Diagnosis Date   B12 deficiency    Borderline   Bulging discs    Degenerative disc disease, cervical    Gastritis    Hyperlipidemia    Neuropathy    Parkinson's disease    Parkinsonian syndrome    Peripheral neuropathy    TIA (transient ischemic attack)    TIA symptoms from hypotension   Past Surgical History:  Procedure Laterality Date   APPENDECTOMY     BACK SURGERY     Social  History:  reports that he has never smoked. He has never used smokeless tobacco. He reports that he does not drink alcohol and does not use drugs.  Allergies  Allergen Reactions   Tape Other (See Comments)    SKIN IS VERY THIN- TEARS VERY EASILY!!   Hydrocodone-Acetaminophen Other (See Comments)    Hallucinations and confusion   Indomethacin Other (See Comments)    Unknown reaction   Lyrica [Pregabalin] Other (See Comments)    Vivid hallucinations   Quetiapine Other (See Comments)    HYPOtension!!    Family History  Problem Relation Age of Onset   Parkinson's disease Mother    Parkinson's disease Father    Diabetes Sister    Heart disease Brother     Prior to Admission medications   Medication Sig Start Date End Date Taking? Authorizing Provider  acetaminophen (TYLENOL) 500 MG tablet Take 2 tablets (1,000 mg total) by mouth 3 (three) times daily. 03/22/20  Yes Dorcas Carrow, MD  Calcium Carb-Cholecalciferol (CALCIUM 600 + D PO) Take 1 tablet by mouth daily.   Yes [provider]  carbidopa-levodopa (SINEMET IR) 25-100 MG tablet Take 1 tablet by mouth 3 (three) times daily. 09/07/15  Yes [provider]  Cyanocobalamin (VITAMIN B 12 PO) Take 1 tablet by mouth in the morning.    Yes [provider]  fludrocortisone (FLORINEF) 0.1 MG tablet TAKE 2 TABLETS  BY MOUTH DAILY 11/10/22  Yes Donita Brooks, MD  FLUoxetine (PROZAC) 20 MG capsule Take 1 capsule (20 mg total) by mouth daily. 12/08/22  Yes Donita Brooks, MD  midodrine (PROAMATINE) 10 MG tablet TAKE 1 TABLET BY MOUTH 3 TIMES DAILY. GENERIC EQUIVALENT FOR PROAMATINE 07/18/22  Yes Donita Brooks, MD  polyethylene glycol powder (GLYCOLAX/MIRALAX) 17 GM/SCOOP powder Take 17 g by mouth See admin instructions. Mix 17 grams of powder into 4-8 ounces of prune juice and drink once a day   Yes [provider]  pravastatin (PRAVACHOL) 20 MG tablet Take 1 tablet by mouth daily at 6 PM. 08/06/22  Yes  Pickard, Priscille Heidelberg, MD  senna-docusate (SENOKOT-S) 8.6-50 MG tablet Take 2 tablets by mouth at bedtime as needed for mild constipation. 04/10/20  Yes Donita Brooks, MD    Physical Exam: Vitals:   05/14/23 2128 05/14/23 2226 05/14/23 2315 05/15/23 0000  BP:  (!) 160/77    Pulse: 66 69    Resp: 18 17    Temp:  (!) 102.3 F (39.1 C) (!) 101 F (38.3 C)   TempSrc:  Rectal Rectal   SpO2: 100% 100%    Weight:    68 kg   Constitutional: NAD, calm, comfortable, frail chronically ill appearing elderly male laying in bed asleep. Awoke easily to voice and light touch. Eyes: lids and conjunctivae normal ENMT: Mucous membranes are moist.  Neck: normal, supple Respiratory: clear to auscultation bilaterally, no wheezing, no crackles. Normal respiratory effort. No accessory muscle use.  Cardiovascular: Regular rate and rhythm, no murmurs / rubs / gallops. No extremity edema.   Abdomen: no tenderness, soft. Bowel sounds positive.  Musculoskeletal: no clubbing / cyanosis. No joint deformity upper and lower extremities. Normal muscle tone.  Skin: no rashes, lesions, ulcers. No induration Neurologic: CN 2-12 grossly intact. Sensation intact, Strength 4/5 in all 4. Weak bilateral hand grip Psychiatric: Poor insight. Alert and oriented to self and time only. Normal mood. No agitation.  Data Reviewed:  See HPI  Assessment and Plan: * Sepsis (HCC) -secondary to UTI and COVID-19 viral infection -start Paxlovid  -continue IV Rocephin pending urine culture -blood culture pending -pt continues to have high fever despite Tylenol but family did not want him to receive NSAIDS citing that he makes him delirious. Pt with ice pack and fan at bedside. Continue to monitor fever trend.   Elevated BP without diagnosis of hypertension -hx of orthostatic hypotension but presented with BP up to 200  -will continue home fludrocortisone but will hold midodrine  Hypocalcemia -mild. Give oral supplement.    Parkinson's disease -Pt at baseline ambulates with walker and has hx of hallucinations -alert and oriented to self and time only. Not able to provide accurate hx.  -continue Sinemet      Advance Care Planning: Full- per wife  Consults: none  Family Communication: discussed with wife at bedside  Severity of Illness: The appropriate patient status for this patient is INPATIENT. Inpatient status is judged to be reasonable and necessary in order to provide the required intensity of service to ensure the patient's safety. The patient's presenting symptoms, physical exam findings, and initial radiographic and laboratory data in the context of their chronic comorbidities is felt to place them at high risk for further clinical deterioration. Furthermore, it is not anticipated that the patient will be medically stable for discharge from the hospital within 2 midnights of admission.   * I certify that at the point of  admission it is my clinical judgment that the patient will require inpatient hospital care spanning beyond 2 midnights from the point of admission due to high intensity of service, high risk for further deterioration and high frequency of surveillance required.*  Author: Anselm Jungling, DO 05/15/2023 1:04 AM  For on call review [email or web address].

## 2023-05-14 NOTE — ED Provider Notes (Addendum)
[place] EMERGENCY DEPARTMENT AT [facility] Provider Note   CSN: [id number] Arrival date & time: 05/14/23  1814     History  Chief Complaint  Patient presents with   Altered Mental Status   Fatigue    Alex Frazier is a 77 y.o. male.  77 year old male with a past medical history of Parkinson's disease, TIA presents to the ED with a chief complaint of fall and mental status.  Patient presents from home where he currently lives with his wife and does have some home health that comes in to help.  He has not been acting himself over the last 24 hours, his wife noted a decline in his mental status, he was not really answering questions or conversing as much.  He does take midodrine for blood pressure, reports his blood pressure was severely elevated.  He is febrile with a rectal temp of 102.8 on arrival.  Patient is altered at baseline and does not answer any questions.  Level 5 caveat due to mental status.  The history is provided by the patient.  Altered Mental Status Associated symptoms: fever        Home Medications Prior to Admission medications   Medication Sig Start Date End Date Taking? Authorizing Provider  FLUoxetine (PROZAC) 20 MG capsule Take 1 capsule (20 mg total) by mouth daily. 12/08/22  Yes Donita Brooks, MD  pravastatin (PRAVACHOL) 20 MG tablet Take 1 tablet by mouth daily at 6 PM. 08/06/22  Yes Pickard, Priscille Heidelberg, MD  acetaminophen (TYLENOL) 500 MG tablet Take 2 tablets (1,000 mg total) by mouth 3 (three) times daily. 03/22/20   Dorcas Carrow, MD  carbidopa-levodopa (SINEMET IR) 25-100 MG tablet Take 1 tablet by mouth 3 (three) times daily. 09/07/15   [provider]  cholecalciferol (VITAMIN D3) 25 MCG (1000 UNIT) tablet Take 2,000 Units by mouth in the morning.     [provider]  Cyanocobalamin (VITAMIN B 12 PO) Take 1 tablet by mouth in the morning.     [provider]  fludrocortisone (FLORINEF) 0.1 MG tablet TAKE 2  TABLETS BY MOUTH DAILY 11/10/22   Donita Brooks, MD  midodrine (PROAMATINE) 10 MG tablet TAKE 1 TABLET BY MOUTH 3 TIMES DAILY. GENERIC EQUIVALENT FOR PROAMATINE 07/18/22   Donita Brooks, MD  polyethylene glycol powder (GLYCOLAX/MIRALAX) 17 GM/SCOOP powder Take 17 g by mouth See admin instructions. Mix 17 grams of powder into 4-8 ounces of prune juice and drink once a day    [provider]  senna-docusate (SENOKOT-S) 8.6-50 MG tablet Take 2 tablets by mouth at bedtime as needed for mild constipation. 04/10/20   Donita Brooks, MD      Allergies    Tape, Hydrocodone-acetaminophen, Indomethacin, Lyrica [pregabalin], and Quetiapine    Review of Systems   Review of Systems  Unable to perform ROS: Mental status change  Constitutional:  Positive for fatigue and fever.    Physical Exam Updated Vital Signs BP 132/71   Pulse 73   Temp (!) 102.1 F (38.9 C) (Rectal)   Resp 16   SpO2 96%  Physical Exam Vitals and nursing note reviewed.  Constitutional:      Appearance: He is ill-appearing.  HENT:     Head: Normocephalic and atraumatic.     Comments: No visible signs of trauma.     Mouth/Throat:     Mouth: Mucous membranes are dry.     Comments: Oral mucosa appears dry Eyes:  Pupils: Pupils are equal, round, and reactive to light.  Cardiovascular:     Rate and Rhythm: Normal rate.  Pulmonary:     Effort: Pulmonary effort is normal.     Breath sounds: No wheezing.     Comments: No audible wheezing on my exam.  Abdominal:     General: Abdomen is flat.     Palpations: Abdomen is soft.     Tenderness: There is no abdominal tenderness.     Comments: No guarding or rebound.   Musculoskeletal:     Cervical back: Normal range of motion and neck supple.  Skin:    General: Skin is warm and dry.     Comments: No bruising or signs of trauma, no rash noted.   Neurological:     Mental Status: He is alert. He is disoriented.     ED Results / Procedures / Treatments    Labs (all labs ordered are listed, but only abnormal results are displayed) Labs Reviewed  RESP PANEL BY RT-PCR (RSV, FLU A&B, COVID)  RVPGX2 - Abnormal; Notable for the following components:      Result Value   SARS Coronavirus 2 by RT PCR POSITIVE (*)    All other components within normal limits  COMPREHENSIVE METABOLIC PANEL - Abnormal; Notable for the following components:   Glucose, Bld 120 (*)    Calcium 8.8 (*)    All other components within normal limits  CBC WITH DIFFERENTIAL/PLATELET - Abnormal; Notable for the following components:   Lymphs Abs 0.5 (*)    All other components within normal limits  URINALYSIS, W/ REFLEX TO CULTURE (INFECTION SUSPECTED) - Abnormal; Notable for the following components:   APPearance TURBID (*)    Hgb urine dipstick SMALL (*)    Ketones, ur 5 (*)    Protein, ur 100 (*)    Leukocytes,Ua MODERATE (*)    Bacteria, UA MANY (*)    All other components within normal limits  I-STAT CG4 LACTIC ACID, ED - Abnormal; Notable for the following components:   Lactic Acid, Venous 2.0 (*)    All other components within normal limits  CULTURE, BLOOD (ROUTINE X 2)  CULTURE, BLOOD (ROUTINE X 2)  RESP PANEL BY RT-PCR (RSV, FLU A&B, COVID)  RVPGX2  URINE CULTURE  PROTIME-INR  APTT  I-STAT CG4 LACTIC ACID, ED    EKG None  Radiology DG Chest Port 1 View  Result Date: 05/14/2023 CLINICAL DATA:  Questionable sepsis-evaluate for abnormality. Positive at home UTI test. More confused and fatigued in the past days. EXAM: PORTABLE CHEST 1 VIEW COMPARISON:  Radiographs 03/17/2020 FINDINGS: Stable cardiomediastinal silhouette. Aortic atherosclerotic calcification. Low lung volumes accentuate pulmonary vascularity. No focal consolidation, pleural effusion, or pneumothorax. No displaced rib fractures. IMPRESSION: No acute cardiopulmonary disease. Electronically Signed   By: Minerva Fester M.D.   On: 05/14/2023 20:50    Procedures .Critical Care  Performed by:  Claude Manges, PA-C Authorized by: Claude Manges, PA-C   Critical care provider statement:    Critical care time (minutes):  45   Critical care start time:  05/14/2023 8:30 PM   Critical care end time:  05/14/2023 9:15 PM   Critical care was necessary to treat or prevent imminent or life-threatening deterioration of the following conditions:  Sepsis   Critical care was time spent personally by me on the following activities:  Development of treatment plan with patient or surrogate, discussions with consultants, evaluation of patient's response to treatment, examination of patient, ordering and review  of laboratory studies, ordering and review of radiographic studies, ordering and performing treatments and interventions, pulse oximetry, re-evaluation of patient's condition and review of old charts     Medications Ordered in ED Medications  lactated ringers infusion ( Intravenous New Bag/Given 05/14/23 1922)  cefTRIAXone (ROCEPHIN) 2 g in sodium chloride 0.9 % 100 mL IVPB (0 g Intravenous Stopped 05/14/23 1959)  acetaminophen (TYLENOL) tablet 650 mg (650 mg Oral Given 05/14/23 1923)    ED Course/ Medical Decision Making/ A&P Clinical Course as of 05/14/23 2129  Thu May 14, 2023  2005 SARS Coronavirus 2 by RT PCR(!): POSITIVE [JS]  2005 Lactic Acid, Venous(!!): 2.0 [JS]  2005 Creatinine: 0.93 [JS]  2057 Bacteria, UA(!): MANY [JS]  2057 Glori Luis): MODERATE [JS]    Clinical Course User Index [JS] Claude Manges, PA-C                                 Medical Decision Making Amount and/or Complexity of Data Reviewed Labs: ordered. Decision-making details documented in ED Course. Radiology: ordered. ECG/medicine tests: ordered.  Risk OTC drugs. Prescription drug management. Decision regarding hospitalization.   This patient presents to the ED for concern of AMS, this involves a number of treatment options, and is a complaint that carries with it a high risk of complications and morbidity.   The differential diagnosis includes infection, encephalopathy, trauma.   Co morbidities: Discussed in HPI   Brief History:  See HPI.   EMR reviewed including pt PMHx, past surgical history and past visits to ER.   See HPI for more details   Lab Tests:  I ordered and independently interpreted labs.  The pertinent results include:    Interpretation of his blood work by me and revealed CBC with no leukocytosis, hemoglobin.  CMP with no electrolyte derangement, creatinine level is normal.  LFTs are within normal limits.  PT and INR normal.  UA with greater than 50 white blood cell count, many bacteria, moderate leukocytes and small hemoglobin.  Respiratory panel also positive for COVID-19.   Imaging Studies:  Chest x-ray without any consolidation,.  CT head final read is currently pending.  Medicines ordered:  I ordered medication including Tylenol for pyrexia Reevaluation of the patient after these medicines showed that the patient improved I have reviewed the patients home medicines and have made adjustments as needed   Critical Interventions:   Patient made a code sepsis due to elevated BP, febrile, started on some prophylactic IV Rocephin to cover for positive urinary tract infection after positive test at home.  Reevaluation:  After the interventions noted above I re-evaluated patient and found that they have :improved   Social Determinants of Health:  The patient's social determinants of health were a factor in the care of this patient  Problem List / ED Course:  Patient with underlying history of Parkinson's presents to the ED with a chief complaint of decrease in mental status over the past 24 hours.  According to wife who is a primary caregiver at this time with some home health therapy.  Patient has not been able to get up from his recliner, she is noted him to feel more weak, ambulates with assistance, has not had much activity over the last 24 hours.  Home  health also noted him to have some decrease in mental status which has not happened previously.  On today's arrival he appears frail, is disoriented, not able to  tell me his name or where he is.  However does recognize his wife along with visitors that have come in to see him.  His blood work is remarkable for no leukocytosis on his CBC.  CMP with no electrolyte derangements, no AKI, LFTs are normal.  Respiratory panel obtained is positive for COVID-19.  UA with some moderate leukocytes, many bacteria and greater than 50 white blood cell count.  Given prophylactic Rocephin to cover for urinary tract infection.  On appearance patient does appear dry, therefore also given some LR to help with hydration.  His lactic acid was positive at 2, will obtain repeat after treatment.  Chest x-ray does not show any pneumonia, O2 saturation remains at 100% on room air despite his infection.  However, with the patient with underlying Parkinson, and altered mental status due to urinary tract infection and COVID-19 I do feel that he needs admission to the hospital for further management of his acute illness. Call placed to hospitalist service for admission. Spoke to Dr. Cyndia Bent, who will admit patient for further management.   Dispostion:  After consideration of the diagnostic results and the patients response to treatment, I feel that the patent would benefit from admission for altered mental status.    Portions of this note were generated with Scientist, clinical (histocompatibility and immunogenetics). Dictation errors may occur despite best attempts at proofreading.   Final Clinical Impression(s) / ED Diagnoses Final diagnoses:  Altered mental status, unspecified altered mental status type    Rx / DC Orders ED Discharge Orders     None         Claude Manges, PA-C 05/14/23 2129    Claude Manges, PA-C 05/14/23 2132    Coral Spikes, DO 05/15/23 0010

## 2023-05-15 DIAGNOSIS — R030 Elevated blood-pressure reading, without diagnosis of hypertension: Secondary | ICD-10-CM

## 2023-05-15 DIAGNOSIS — U071 COVID-19: Secondary | ICD-10-CM | POA: Diagnosis not present

## 2023-05-15 LAB — BASIC METABOLIC PANEL
Anion gap: 10 (ref 5–15)
BUN: 17 mg/dL (ref 8–23)
CO2: 23 mmol/L (ref 22–32)
Calcium: 8.3 mg/dL — ABNORMAL LOW (ref 8.9–10.3)
Chloride: 100 mmol/L (ref 98–111)
Creatinine, Ser: 0.73 mg/dL (ref 0.61–1.24)
GFR, Estimated: >60 mL/min (ref >60)
Glucose, Bld: 89 mg/dL (ref 70–99)
Potassium: 3.6 mmol/L (ref 3.5–5.1)
Sodium: 133 mmol/L — ABNORMAL LOW (ref 135–145)

## 2023-05-15 MED ORDER — PRAVASTATIN SODIUM 20 MG PO TABS
20.0000 mg | ORAL_TABLET | Freq: Every day | ORAL | Status: DC
  Administered 2023-05-15 – 2023-05-17 (×3): 20 mg via ORAL
  Filled 2023-05-15 (×3): qty 1

## 2023-05-15 MED ORDER — SODIUM CHLORIDE 0.9 % IV SOLN
1.0000 g | INTRAVENOUS | Status: DC
Start: 2023-05-15 — End: 2023-05-15

## 2023-05-15 MED ORDER — FLUOXETINE HCL 20 MG PO CAPS
20.0000 mg | ORAL_CAPSULE | Freq: Every day | ORAL | Status: DC
  Administered 2023-05-15 – 2023-05-17 (×3): 20 mg via ORAL
  Filled 2023-05-15 (×3): qty 1

## 2023-05-15 MED ORDER — FLUDROCORTISONE ACETATE 0.1 MG PO TABS
0.0500 mg | ORAL_TABLET | Freq: Every day | ORAL | Status: DC
  Administered 2023-05-15 – 2023-05-17 (×3): 0.05 mg via ORAL
  Filled 2023-05-15 (×3): qty 1

## 2023-05-15 MED ORDER — CALCIUM CARBONATE 1250 (500 CA) MG PO TABS
1.0000 | ORAL_TABLET | Freq: Once | ORAL | Status: AC
  Administered 2023-05-15: 1250 mg via ORAL
  Filled 2023-05-15: qty 1

## 2023-05-15 MED ORDER — SENNOSIDES-DOCUSATE SODIUM 8.6-50 MG PO TABS: 2.0000 | ORAL_TABLET | Freq: Every evening | ORAL | Status: DC | PRN

## 2023-05-15 MED ORDER — ENOXAPARIN SODIUM 40 MG/0.4ML IJ SOSY
40.0000 mg | PREFILLED_SYRINGE | INTRAMUSCULAR | Status: DC
  Administered 2023-05-15 – 2023-05-17 (×3): 40 mg via SUBCUTANEOUS
  Filled 2023-05-15 (×3): qty 0.4

## 2023-05-15 MED ORDER — ACETAMINOPHEN 325 MG PO TABS
650.0000 mg | ORAL_TABLET | Freq: Four times a day (QID) | ORAL | Status: DC | PRN
  Administered 2023-05-15 (×3): 650 mg via ORAL
  Filled 2023-05-15 (×3): qty 2

## 2023-05-15 MED ORDER — FLUDROCORTISONE ACETATE 0.1 MG PO TABS: 200.0000 ug | ORAL_TABLET | Freq: Every day | ORAL | Status: DC

## 2023-05-15 MED ORDER — ENSURE ENLIVE PO LIQD
237.0000 mL | Freq: Two times a day (BID) | ORAL | Status: DC
  Administered 2023-05-15 – 2023-05-17 (×5): 237 mL via ORAL

## 2023-05-15 MED ORDER — CARBIDOPA-LEVODOPA 25-100 MG PO TABS
1.0000 | ORAL_TABLET | Freq: Three times a day (TID) | ORAL | Status: DC
  Administered 2023-05-15 – 2023-05-16 (×4): 1 via ORAL
  Filled 2023-05-15 (×5): qty 1

## 2023-05-15 MED ORDER — NIRMATRELVIR/RITONAVIR (PAXLOVID)TABLET
3.0000 | ORAL_TABLET | Freq: Two times a day (BID) | ORAL | Status: DC
  Administered 2023-05-15 – 2023-05-17 (×6): 3 via ORAL
  Filled 2023-05-15: qty 30

## 2023-05-15 NOTE — Progress Notes (Addendum)
[facility] Progress Note Patient: Alex Frazier [id number]:[id number] DOB: 09/19/1945 DOA: 05/14/2023  DOS: the patient was seen and examined on 05/15/2023  Brief hospital course: PMH of Parkinson's disease, HLD, chronic orthostatic hypotension, B12 deficiency.  Presented to hospital with complaints of fatigue and confusion.  Found to have COVID-19 infection.  Suspected UTI as well.  Assessment and Plan: COVID-19 infection. SIRS criteria not met-sepsis ruled out. Possible UTI. Tmax 102.8.  WBC 6.5.  Had some confusion at the time of admission.  Blood pressure was elevated.  No leukocytosis.  Lactic acid mildly elevated.  No evidence of organ injury so far. UA is nitrate negative but has some WBCs. X-ray without any pneumonia. COVID-positive with history of recent exposure. Not hypoxic.  Will check CRP.  Currently no indication to initiate steroids. Currently on IV antibiotic for possible UTI although will have low threshold to discontinue antibiotic. Also on Paxlovid which I will continue. Follow-up on blood cultures. Avoiding NSAIDs per family request.  Tylenol as needed.  Generalized weakness causing a near fall. Wife was helping him in the bathroom and due to severe weakness patient could not support his weight and wife had to lower him in the bathroom. Wife denies any head injury or neck injury. No focal deficit at the time of my evaluation. Mentation wise patient is close to his baseline now. CT of the head is also unremarkable. PT OT consulted. For now we will monitor.  History of orthostatic hypotension. Hypertensive urgency. BP on admission 224/104.  Currently improving but not hypotensive. At baseline on Florinef 0.2 mg and midodrine 10 mg 3 times daily. Currently holding midodrine. Resuming Florinef but only at 0.05 mg and monitor response.  Parkinson's disease. At risk for autonomic dysfunction. Chronically on dysphagia 3 diet. Resuming Sinemet.  Mood  disorder. Continue Prozac.  HLD. Continue statin.  Lactic acidosis. Likely in the setting of poor p.o. intake.  Improving.   Subjective: No acute complaint.  No nausea no vomiting.  Has a good sense of humor.  Appetite improving.  Physical Exam: General: in Mild distress, No Rash Cardiovascular: S1 and S2 Present, No Murmur Respiratory: Good respiratory effort, Bilateral Air entry present. No Crackles, No wheezes Abdomen: Bowel Sound present, No tenderness Extremities: No edema Neuro: Alert and oriented x3, no new focal deficit, no asterixis.,  Pupils are equal reactive to light.  Data Reviewed: I have Reviewed nursing notes, Vitals, and Lab results. Since last encounter, pertinent lab results CBC and BMP   . I have ordered test including CBC and BMP  .   Disposition: Status is: Inpatient Remains inpatient appropriate because: Awaiting blood cultures  enoxaparin (LOVENOX) injection 40 mg Start: 05/15/23 1000   Family Communication: Family at bedside Level of care: Telemetry   Vitals:   05/15/23 0642 05/15/23 0650 05/15/23 1023 05/15/23 1236  BP: (!) 200/86 (!) 175/90 (!) 153/79 127/69  Pulse: 69  75 70  Resp: 18     Temp: 98.3 F (36.8 C)  99 F (37.2 C) (!) 100.8 F (38.2 C)  TempSrc:      SpO2: 98%  98% 97%  Weight:         Author: Lynden Oxford, MD 05/15/2023 3:38 PM  Please look on [email or web address] to find out who is on call.

## 2023-05-15 NOTE — Patient Instructions (Addendum)
Visit Information  Thank you for taking time to visit with me today. Please don't hesitate to contact me if I can be of assistance to you.   Following are the goals we discussed today:   Goals Addressed             This Visit's Progress    Home management of falls, poor ambulation, recurrent uti , parkinson (Care coordination services)   Not on track    Care Coordination Interventions: Evaluation of current treatment plan related to Parkinson's disease, UTI and patient's adherence to plan as established by provider  Care giver goal: Mrs Santalucia will check patient for possible UTI using purchased test strips and seek care as needed   Interventions Today    Flowsheet Row Most Recent Value  Chronic Disease   Chronic disease during today's visit Other  [UTI symptoms, how to check uti on a patient wearing depends, hoyer lift]  General Interventions   General Interventions Discussed/Reviewed General Interventions Reviewed, Doctor Visits, Durable Medical Equipment (DME), Sick Day Rules  Doctor Visits Discussed/Reviewed Doctor Visits Reviewed, PCP  Durable Medical Equipment (DME) Other  [hoyer lift discussed]  PCP/Specialist Visits Compliance with follow-up visit  Exercise Interventions   Exercise Discussed/Reviewed Exercise Reviewed, Physical Activity, Assistive device use and maintanence  [discussed hoyer lift to use during the times Mr Manalang has weakness]  Physical Activity Discussed/Reviewed Physical Activity Reviewed  Education Interventions   Education Provided Provided Education, Provided Web-based Education  [web base education on UTI, Sepsis]  Nutrition Interventions   Nutrition Discussed/Reviewed Nutrition Reviewed, Fluid intake, Supplemental nutrition  Safety Interventions   Safety Discussed/Reviewed Safety Reviewed, Home Safety  Home Safety Assistive Devices  [discussed use of hoyer lift to prevent patient & caregiver injury]               Our next appointment is by  telephone on 05/21/23 at 2:30 pm   Please call the care guide team at [phone] if you need to cancel or reschedule your appointment.   If you are experiencing a Mental Health or Behavioral Health Crisis or need someone to talk to, please call the Suicide and Crisis Lifeline: 988 call the [place] National Suicide Prevention Lifeline: (804)135-0290 or TTY: 469-066-9873 TTY 248-833-9220) to talk to a trained counselor call 1-800-273-TALK (toll free, 24 hour hotline) go to [facility] [address] [place] [phone]) call the [facility] Crisis Line: ([phone] call 911   Patient verbalizes understanding of instructions and care plan provided today and agrees to view in MyChart. Active MyChart status and patient understanding of how to access instructions and care plan via MyChart confirmed with patient.     The patient has been provided with contact information for the care management team and has been advised to call with any health related questions or concerns.   Laquana Villari L. Noelle Penner, RN, BSN, CCM, Care Management Coordinator [phone]

## 2023-05-15 NOTE — Plan of Care (Signed)

## 2023-05-15 NOTE — TOC Progression Note (Signed)
Transition of Care [facility]) - Progression Note    Patient Details  Name: Alex Frazier MRN: [id number] Date of Birth: March 09, 1946  Transition of Care [facility]) CM/SW Contact  Jacquees Gongora, Olegario Messier, RN Phone Number: 05/15/2023, 3:02 PM  Clinical Narrative:  Active w/[facility] HHPT/aide. Has rw,w/c. [name](spouse) wants return home w/HHC. PT cons.     Expected Discharge Plan: Home w Home Health Services Barriers to Discharge: Continued Medical Work up  Expected Discharge Plan and Services In-house Referral: NA Discharge Planning Services: NA   Living arrangements for the past 2 months: Single Family Home                                       Social Determinants of Health (SDOH) Interventions SDOH Screenings   Food Insecurity: No Food Insecurity (05/14/2023)  Housing: Low Risk  (05/14/2023)  Transportation Needs: No Transportation Needs (05/14/2023)  Utilities: Not At Risk (05/14/2023)  Alcohol Screen: Low Risk  (08/19/2022)  Depression (PHQ2-9): Low Risk  (08/19/2022)  Financial Resource Strain: Low Risk  (08/19/2022)  Physical Activity: Inactive (08/19/2022)  Social Connections: Socially Integrated (08/19/2022)  Stress: No Stress Concern Present (08/19/2022)  Tobacco Use: Low Risk  (05/14/2023)  Health Literacy: Inadequate Health Literacy (04/13/2023)    Readmission Risk Interventions     No data to display

## 2023-05-15 NOTE — Assessment & Plan Note (Signed)
-  hx of orthostatic hypotension but presented with BP up to 200  -will continue home fludrocortisone but will hold midodrine

## 2023-05-15 NOTE — Assessment & Plan Note (Addendum)
-  secondary to UTI and COVID-19 viral infection -start Paxlovid  -continue IV Rocephin pending urine culture -blood culture pending -pt continues to have high fever despite Tylenol but family did not want him to receive NSAIDS citing that he makes him delirious. Pt with ice pack and fan at bedside. Continue to monitor fever trend.

## 2023-05-15 NOTE — Assessment & Plan Note (Signed)
-  mild. Give oral supplement.

## 2023-05-15 NOTE — Assessment & Plan Note (Signed)
-  Pt at baseline ambulates with walker and has hx of hallucinations -alert and oriented to self and time only. Not able to provide accurate hx.  -continue Sinemet

## 2023-05-15 NOTE — Hospital Course (Addendum)
Brief hospital course: PMH of Parkinson's disease, HLD, chronic orthostatic hypotension, B12 deficiency.  Presented to hospital with complaints of fatigue and confusion.  Found to have COVID-19 infection.  Suspected UTI as well.  Assessment and Plan: COVID-19 infection. SIRS criteria not met-sepsis ruled out. Proteus UTI Tmax 102.8.  WBC 6.5.  Had some confusion at the time of admission.  Blood pressure was elevated.  No leukocytosis.  Lactic acid mildly elevated.  No evidence of organ injury so far. UA is nitrate negative but has some WBCs. X-ray without any pneumonia. COVID-positive with history of recent exposure. Not hypoxic.  Mildly elevated CRP.  Currently no indication to initiate steroids. Also on Paxlovid which I will continue. Follow-up on blood cultures.  Negative.  Urine culture positive. Avoiding NSAIDs per family request.  Tylenol as needed.  Generalized weakness causing a near fall. Wife was helping him in the bathroom and due to severe weakness patient could not support his weight and wife had to lower him in the bathroom. Wife denies any head injury or neck injury. No focal deficit at the time of my evaluation. Mentation wise patient is close to his baseline now. CT of the head is also unremarkable. PT OT consulted.  Home health recommended. For now we will monitor.  History of orthostatic hypotension. Hypertensive urgency. BP on admission 224/104.  Currently improving but not hypotensive. At baseline on Florinef 0.2 mg and midodrine 10 mg 3 times daily. Currently holding midodrine. Resuming Florinef but only at 0.05 mg and monitor response.  Parkinson's disease. At risk for autonomic dysfunction. Chronically on dysphagia 3 diet. Resuming Sinemet.  Pending adjusted.  Mood disorder. Continue Prozac.  HLD. Continue statin.  Lactic acidosis. Likely in the setting of poor p.o. intake.  Improving.

## 2023-05-15 NOTE — TOC Initial Note (Signed)
Transition of Care [facility]) - Initial/Assessment Note    Patient Details  Name: Alex Frazier MRN: [id number] Date of Birth: May 26, 1946  Transition of Care [facility]) CM/SW Contact:    Coralyn Helling, LCSW Phone Number: 05/15/2023, 9:30 AM  Clinical Narrative:          Patient from home with souse. Patient uses a rollator at baseline and needs assistance with ADLs. Patient is active with [facility] for HHPT and aide. TOC will follow for needs.   PLAN: TBD         Expected Discharge Plan: Home w Home Health Services Barriers to Discharge: Continued Medical Work up   Patient Goals and CMS Choice Patient states their goals for this hospitalization and ongoing recovery are::  (oriented 2x unable to assess)          Expected Discharge Plan and Services In-house Referral: NA Discharge Planning Services: NA   Living arrangements for the past 2 months: Single Family Home                                      Prior Living Arrangements/Services Living arrangements for the past 2 months: Single Family Home Lives with:: Spouse Patient language and need for interpreter reviewed:: Yes        Need for Family Participation in Patient Care: Yes (Comment) Care giver support system in place?: Yes (comment) Current home services: DME, Home PT, Homehealth aide Criminal Activity/Legal Involvement Pertinent to Current Situation/Hospitalization: No - Comment as needed  Activities of Daily Living   ADL Screening (condition at time of admission) Patient's cognitive ability adequate to safely complete daily activities?: Yes Is the patient deaf or have difficulty hearing?: Yes Does the patient have difficulty seeing, even when wearing glasses/contacts?: No Does the patient have difficulty concentrating, remembering, or making decisions?: Yes Patient able to express need for assistance with ADLs?: No Does the patient have difficulty dressing or bathing?: Yes Independently performs ADLs?:  No Communication: Needs assistance Is this a change from baseline?: Pre-admission baseline Dressing (OT): Needs assistance Is this a change from baseline?: Pre-admission baseline Grooming: Needs assistance Is this a change from baseline?: Pre-admission baseline Feeding: Independent Bathing: Needs assistance Is this a change from baseline?: Pre-admission baseline Toileting: Needs assistance Is this a change from baseline?: Pre-admission baseline In/Out Bed: Needs assistance Is this a change from baseline?: Pre-admission baseline Walks in Home: Needs assistance Is this a change from baseline?: Pre-admission baseline Does the patient have difficulty walking or climbing stairs?: Yes Weakness of Legs: Both Weakness of Arms/Hands: Both  Permission Sought/Granted Permission sought to share information with : Family Supports, Oceanographer granted to share information with : No (patient oriented x2)  Share Information with NAME: [name]  Permission granted to share info w AGENCY: [facility]( HH)  Permission granted to share info w Relationship: Spouse     Emotional Assessment Appearance:: Appears stated age Attitude/Demeanor/Rapport: Unable to Assess Affect (typically observed): Unable to Assess Orientation: : Oriented to Self, Oriented to Place Alcohol / Substance Use: Not Applicable Psych Involvement: No (comment)  Admission diagnosis:  Sepsis (HCC) [A41.9] Altered mental status, unspecified altered mental status type [R41.82] Patient Active Problem List   Diagnosis Date Noted   Elevated BP without diagnosis of hypertension 05/15/2023   Sepsis (HCC) 05/14/2023   COVID-19 virus infection 05/14/2023   Sepsis secondary to UTI (HCC) 05/14/2023   Hypocalcemia 05/14/2023  Pulmonary nodule    Lumbar compression fracture (HCC) 10/26/2019   Autonomic dysfunction 08/19/2019   Arterial hypotension 08/19/2019   DDD (degenerative disc disease), cervical  04/07/2016   Parkinson's disease 07/08/2011   Idiopathic polyneuropathy 07/08/2011   Bulging discs    Gastritis    Hyperlipidemia    PCP:  Donita Brooks, MD Pharmacy:   [facility] ORDER) ELECTRONIC - [place], [place] - [address] [place] [address] Phone: [phone] Fax: [phone]  [facility] - [place], [place] - [phone] [facility] [address] [facility] [place] [address] Phone: [phone] Fax: [phone]  [id number] - [place], [place] - [address] AT [facility] [address] [facility] [address] [place] [address] Phone: [phone] Fax: [phone]     Social Determinants of Health (SDOH) Social History: SDOH Screenings   Food Insecurity: No Food Insecurity (05/14/2023)  Housing: Low Risk  (05/14/2023)  Transportation Needs: No Transportation Needs (05/14/2023)  Utilities: Not At Risk (05/14/2023)  Alcohol Screen: Low Risk  (08/19/2022)  Depression (PHQ2-9): Low Risk  (08/19/2022)  Financial Resource Strain: Low Risk  (08/19/2022)  Physical Activity: Inactive (08/19/2022)  Social Connections: Socially Integrated (08/19/2022)  Stress: No Stress Concern Present (08/19/2022)  Tobacco Use: Low Risk  (05/14/2023)  Health Literacy: Inadequate Health Literacy (04/13/2023)   SDOH Interventions:     Readmission Risk Interventions     No data to display

## 2023-05-16 DIAGNOSIS — U071 COVID-19: Secondary | ICD-10-CM | POA: Diagnosis not present

## 2023-05-16 LAB — COMPREHENSIVE METABOLIC PANEL
ALT: 21 U/L (ref 0–44)
AST: 55 U/L — ABNORMAL HIGH (ref 15–41)
Albumin: 3.3 g/dL — ABNORMAL LOW (ref 3.5–5.0)
Alkaline Phosphatase: 85 U/L (ref 38–126)
Anion gap: 9 (ref 5–15)
BUN: 16 mg/dL (ref 8–23)
CO2: 27 mmol/L (ref 22–32)
Calcium: 8.3 mg/dL — ABNORMAL LOW (ref 8.9–10.3)
Chloride: 100 mmol/L (ref 98–111)
Creatinine, Ser: 0.79 mg/dL (ref 0.61–1.24)
GFR, Estimated: >60 mL/min (ref >60)
Glucose, Bld: 149 mg/dL — ABNORMAL HIGH (ref 70–99)
Potassium: 3.6 mmol/L (ref 3.5–5.1)
Sodium: 136 mmol/L (ref 135–145)
Total Bilirubin: 0.5 mg/dL (ref 0.3–1.2)
Total Protein: 6.1 g/dL — ABNORMAL LOW (ref 6.5–8.1)

## 2023-05-16 LAB — CBC WITH DIFFERENTIAL/PLATELET
Abs Immature Granulocytes: 0.02 10*3/uL (ref 0.00–0.07)
Basophils Absolute: 0 10*3/uL (ref 0.0–0.1)
Basophils Relative: 1 %
Eosinophils Absolute: 0 10*3/uL (ref 0.0–0.5)
Eosinophils Relative: 1 %
HCT: 40.8 % (ref 39.0–52.0)
Hemoglobin: 13.5 g/dL (ref 13.0–17.0)
Immature Granulocytes: 1 %
Lymphocytes Relative: 33 %
Lymphs Abs: 0.9 10*3/uL (ref 0.7–4.0)
MCH: 29.8 pg (ref 26.0–34.0)
MCHC: 33.1 g/dL (ref 30.0–36.0)
MCV: 90.1 fL (ref 80.0–100.0)
Monocytes Absolute: 0.2 10*3/uL (ref 0.1–1.0)
Monocytes Relative: 8 %
Neutro Abs: 1.6 10*3/uL — ABNORMAL LOW (ref 1.7–7.7)
Neutrophils Relative %: 56 %
Platelets: 156 10*3/uL (ref 150–400)
RBC: 4.53 MIL/uL (ref 4.22–5.81)
RDW: 13.9 % (ref 11.5–15.5)
WBC: 2.8 10*3/uL — ABNORMAL LOW (ref 4.0–10.5)
nRBC: 0 % (ref 0.0–0.2)

## 2023-05-16 LAB — C-REACTIVE PROTEIN: CRP: 3 mg/dL — ABNORMAL HIGH (ref <1.0)

## 2023-05-16 LAB — MAGNESIUM: Magnesium: 1.9 mg/dL (ref 1.7–2.4)

## 2023-05-16 MED ORDER — CARBIDOPA-LEVODOPA 25-100 MG PO TABS
1.0000 | ORAL_TABLET | Freq: Three times a day (TID) | ORAL | Status: DC
  Administered 2023-05-16 – 2023-05-17 (×4): 1 via ORAL
  Filled 2023-05-16 (×5): qty 1

## 2023-05-16 NOTE — Plan of Care (Signed)
  Problem: Activity: Goal: Risk for activity intolerance will decrease Outcome: Progressing   Problem: Nutrition: Goal: Adequate nutrition will be maintained Outcome: Progressing   Problem: Coping: Goal: Level of anxiety will decrease Outcome: Progressing   Problem: Elimination: Goal: Will not experience complications related to bowel motility Outcome: Progressing   Problem: Elimination: Goal: Will not experience complications related to urinary retention Outcome: Progressing   Problem: Pain Managment: Goal: General experience of comfort will improve Outcome: Progressing   Problem: Skin Integrity: Goal: Risk for impaired skin integrity will decrease Outcome: Progressing

## 2023-05-16 NOTE — Progress Notes (Signed)
Mobility Specialist - Progress Note   05/16/23 1540  Mobility  Activity Transferred to/from [facility];Transferred from chair to bed  Level of Assistance +2 (takes two people)  Assistive Device [employer] Ambulated (ft) 0 ft  Range of Motion/Exercises Active  Activity Response Tolerated well  Mobility Referral Yes  $Mobility charge 1 Mobility  Mobility Specialist Start Time (ACUTE ONLY) 1525  Mobility Specialist Stop Time (ACUTE ONLY) 1535  Mobility Specialist Time Calculation (min) (ACUTE ONLY) 10 min   Pt received in chair and requested to transfer, went to [facility] then bed with all needs met and family in room.  Marilynne Halsted Mobility Specialist

## 2023-05-16 NOTE — Progress Notes (Signed)
Physical Therapy Evaluation Patient Details Name: Alex Frazier MRN: [id number] DOB: [birth year] Today's Date: 05/16/2023  History of Present Illness  Pt is a 77yo male presenting to [facility] ED s/p mechanical fall in the bathroom requiring help to get up. Found to be febrile and hypertensive with active COVID-19 infection and UTI. Imaging negative for acute findings.  PMH: PD, HLD, neuropathy, hx of TIA, back surgery.   Clinical Impression  Pt admitted with above diagnosis; wife provided hx and CLOF, pt alert and oriented only to self, unable to verify any information, pt very HOH also. Pt required modA for bed mobility with HOB elevated and maxA+2 for transfers; after transfers pt very fatigued so unable to progress to ambulation; 4WRW utilized. Wife reports pt utilizes U-Walker while at home except in inaccessible bathroom pt utilizes standard walker; wife provides significant mobility and cognitive assist at baseline. Pt currently with functional limitations due to the deficits listed below (see PT Problem List). Adding pt to mobility specialist caseload. Pt will benefit from acute skilled PT to increase their independence and safety with mobility to allow discharge.           If plan is discharge home, recommend the following: A little help with walking and/or transfers;A little help with bathing/dressing/bathroom;Assistance with cooking/housework;Assistance with feeding;Direct supervision/assist for medications management;Direct supervision/assist for financial management;Assist for transportation;Help with stairs or ramp for entrance;Supervision due to cognitive status   Can travel by private vehicle        Equipment Recommendations None recommended by PT  Recommendations for Other Services       Functional Status Assessment Patient has had a recent decline in their functional status and demonstrates the ability to make significant improvements in function in a reasonable and predictable  amount of time.     Precautions / Restrictions Precautions Precautions: Fall Precaution Comments: Fell at home in the bathroom, COVID + Restrictions Weight Bearing Restrictions: No      Mobility  Bed Mobility Overal bed mobility: Needs Assistance Bed Mobility: Supine to Sit     Supine to sit: Mod assist, HOB elevated     General bed mobility comments: Pt requires modA for lift assist of trunk as well as bringing BLE off bed despite HOB elevated and use of bed rail    Transfers Overall transfer level: Needs assistance Equipment used: Rollator (4 wheels) Transfers: Sit to/from Stand, Bed to chair/wheelchair/BSC Sit to Stand: Max assist, From elevated surface, +2 safety/equipment   Step pivot transfers: Max assist, From elevated surface, +2 safety/equipment       General transfer comment: Pt required maxA for lift assistance +2 for steadying of AD; stpe pivot max assistance prevention of posterior lean, no overt LOB noted. Pt with reduced foot clearance bilaterally and struggling to mobilize RLE.    Ambulation/Gait               General Gait Details: Deferred secondary to fatigue  Stairs            Wheelchair Mobility     Tilt Bed    Modified Rankin (Stroke Patients Only)       Balance Overall balance assessment: History of Falls, Needs assistance Sitting-balance support: Feet supported, Single extremity supported Sitting balance-Leahy Scale: Poor Sitting balance - Comments: Requires intermittent assistance to prevent posterior lean   Standing balance support: Reliant on assistive device for balance, During functional activity, Bilateral upper extremity supported Standing balance-Leahy Scale: Poor Standing balance comment: Requires BUE support on AD as  well as physical support from PT                             Pertinent Vitals/Pain Pain Assessment Pain Assessment: No/denies pain    Home Living Family/patient expects to be  discharged to:: Private residence Living Arrangements: Spouse/significant other Available Help at Discharge: Family;Available 24 hours/day Type of Home: House Home Access: Ramped entrance       Home Layout: One level Home Equipment: Agricultural consultant (2 wheels);Rollator (4 wheels);Shower seat;BSC/3in1;Grab bars - toilet;Grab bars - tub/shower;Wheelchair - manual Additional Comments: Pt receiving HHPT 1x.week    Prior Function Prior Level of Function : Needs assist;History of Falls (last six months)  Cognitive Assist : Mobility (cognitive);ADLs (cognitive) Mobility (Cognitive): Step by step cues ADLs (Cognitive): Step by step cues Physical Assist : ADLs (physical);Mobility (physical)   ADLs (physical): Grooming;Bathing;Dressing;Toileting;IADLs Mobility Comments: U-Walker at baseline, lift chair, ADLs Comments: Wife assists with all ADLs excepting feeding upper body dressing     Extremity/Trunk Assessment   Upper Extremity Assessment Upper Extremity Assessment: Defer to OT evaluation    Lower Extremity Assessment Lower Extremity Assessment: Generalized weakness;RLE deficits/detail;LLE deficits/detail RLE Deficits / Details: Typically wears RAFO not present in room. While in supine foot in extreme IR RLE Sensation: WNL LLE Deficits / Details: Wears LAFO LLE Sensation: WNL    Cervical / Trunk Assessment Cervical / Trunk Assessment: Kyphotic  Communication   Communication Communication: Hearing impairment Cueing Techniques: Verbal cues;Tactile cues;Visual cues  Cognition Arousal: Lethargic Behavior During Therapy: WFL for tasks assessed/performed, Flat affect Overall Cognitive Status: History of cognitive impairments - at baseline                                          General Comments      Exercises     Assessment/Plan    PT Assessment Patient needs continued PT services  PT Problem List Decreased coordination;Decreased mobility;Decreased  balance;Decreased activity tolerance;Decreased range of motion;Decreased strength;Decreased safety awareness       PT Treatment Interventions DME instruction;Gait training;Stair training;Functional mobility training;Therapeutic activities;Therapeutic exercise;Balance training;Neuromuscular re-education;Cognitive remediation;Patient/family education;Wheelchair mobility training    PT Goals (Current goals can be found in the Care Plan section)  Acute Rehab PT Goals Patient Stated Goal: Wife would like to take the patient home PT Goal Formulation: With patient Time For Goal Achievement: 05/30/23 Potential to Achieve Goals: Good    Frequency Min 1X/week     Co-evaluation               AM-PAC PT "6 Clicks" Mobility  Outcome Measure Help needed turning from your back to your side while in a flat bed without using bedrails?: A Little Help needed moving from lying on your back to sitting on the side of a flat bed without using bedrails?: A Little Help needed moving to and from a bed to a chair (including a wheelchair)?: A Lot Help needed standing up from a chair using your arms (e.g., wheelchair or bedside chair)?: A Lot Help needed to walk in hospital room?: A Lot Help needed climbing 3-5 steps with a railing? : Total 6 Click Score: 13    End of Session Equipment Utilized During Treatment: Gait belt Activity Tolerance: No increased pain;Patient limited by fatigue Patient left: in chair;with call bell/phone within reach;with chair alarm set;with family/visitor present Nurse Communication: Mobility  status;Need for lift equipment (Steady) PT Visit Diagnosis: History of falling (Z91.81);Muscle weakness (generalized) (M62.81);Other abnormalities of gait and mobility (R26.89)    Time: 6045-4098 PT Time Calculation (min) (ACUTE ONLY): 48 min   Charges:   PT Evaluation $PT Eval Moderate Complexity: 1 Mod PT Treatments $Therapeutic Activity: 23-37 mins PT General Charges $$ ACUTE  PT VISIT: 1 Visit        Jamesetta Geralds, PT, DPT [facility] Rehabilitation Department Office: [phone]  Jamesetta Geralds 05/16/2023, 1:08 PM

## 2023-05-16 NOTE — Progress Notes (Signed)
[facility] Progress Note Patient: Alex Frazier GHW:[id number] DOB: 04-Oct-1945 DOA: 05/14/2023  DOS: the patient was seen and examined on 05/16/2023  Brief hospital course: PMH of Parkinson's disease, HLD, chronic orthostatic hypotension, B12 deficiency.  Presented to hospital with complaints of fatigue and confusion.  Found to have COVID-19 infection.  Suspected UTI as well.  Assessment and Plan: COVID-19 infection. SIRS criteria not met-sepsis ruled out. Proteus UTI Tmax 102.8.  WBC 6.5.  Had some confusion at the time of admission.  Blood pressure was elevated.  No leukocytosis.  Lactic acid mildly elevated.  No evidence of organ injury so far. UA is nitrate negative but has some WBCs. X-ray without any pneumonia. COVID-positive with history of recent exposure. Not hypoxic.  Mildly elevated CRP.  Currently no indication to initiate steroids. Also on Paxlovid which I will continue. Follow-up on blood cultures.  Negative.  Urine culture positive. Avoiding NSAIDs per family request.  Tylenol as needed.  Generalized weakness causing a near fall. Wife was helping him in the bathroom and due to severe weakness patient could not support his weight and wife had to lower him in the bathroom. Wife denies any head injury or neck injury. No focal deficit at the time of my evaluation. Mentation wise patient is close to his baseline now. CT of the head is also unremarkable. PT OT consulted.  Home health recommended. For now we will monitor.  History of orthostatic hypotension. Hypertensive urgency. BP on admission 224/104.  Currently improving but not hypotensive. At baseline on Florinef 0.2 mg and midodrine 10 mg 3 times daily. Currently holding midodrine. Resuming Florinef but only at 0.05 mg and monitor response.  Parkinson's disease. At risk for autonomic dysfunction. Chronically on dysphagia 3 diet. Resuming Sinemet.  Pending adjusted.  Mood disorder. Continue  Prozac.  HLD. Continue statin.  Lactic acidosis. Likely in the setting of poor p.o. intake.  Improving.   Subjective: No nausea no vomiting no fever no chills.  Was sleepy this morning.  No diarrhea.  Physical Exam: General: in Mild distress, No Rash Cardiovascular: S1 and S2 Present, No Murmur Respiratory: Good respiratory effort, Bilateral Air entry present. No Crackles, No wheezes Abdomen: Bowel Sound present, No tenderness Extremities: No edema Neuro: Alert and oriented x3, no new focal deficit  Data Reviewed: I have Reviewed nursing notes, Vitals, and Lab results. Since last encounter, pertinent lab results CBC and BMP   . I have ordered test including CBC and BMP  .   Disposition: Status is: Inpatient Remains inpatient appropriate because: Monitor for improvement in mentation and follow-up on urine culture  enoxaparin (LOVENOX) injection 40 mg Start: 05/15/23 1000   Family Communication: Wife at bedside Level of care: Telemetry   Vitals:   05/15/23 1236 05/15/23 1930 05/16/23 0442 05/16/23 1324  BP: 127/69 (!) 170/86 (!) 148/71 (!) 144/70  Pulse: 70 66 (!) 54 70  Resp:  20  20  Temp: (!) 100.8 F (38.2 C) 98.3 F (36.8 C) 97.9 F (36.6 C) 98.1 F (36.7 C)  TempSrc:  Oral Oral   SpO2: 97% 99% 96% 98%  Weight:         Author: Lynden Oxford, MD 05/16/2023 5:54 PM  Please look on [email or web address] to find out who is on call.

## 2023-05-17 DIAGNOSIS — U071 COVID-19: Secondary | ICD-10-CM | POA: Diagnosis not present

## 2023-05-17 LAB — URINE CULTURE: Culture: >=100000 — AB

## 2023-05-17 MED ORDER — CEPHALEXIN 500 MG PO CAPS: 500.0000 mg | ORAL_CAPSULE | Freq: Three times a day (TID) | ORAL | 0 refills | Status: AC

## 2023-05-17 MED ORDER — FLUDROCORTISONE ACETATE 0.1 MG PO TABS: 0.1000 mg | ORAL_TABLET | Freq: Every day | ORAL | Status: DC

## 2023-05-17 MED ORDER — NIRMATRELVIR/RITONAVIR (PAXLOVID)TABLET: 3.0000 | ORAL_TABLET | Freq: Two times a day (BID) | ORAL | Status: AC

## 2023-05-17 MED ORDER — MIDODRINE HCL 10 MG PO TABS: 10.0000 mg | ORAL_TABLET | Freq: Three times a day (TID) | ORAL | Status: DC | PRN

## 2023-05-17 NOTE — Progress Notes (Signed)
Mobility Specialist - Progress Note   05/17/23 1041  Mobility  Activity Transferred from bed to chair  Level of Assistance Moderate assist, patient does 50-74%  Assistive Device Stedy  Distance Ambulated (ft) 0 ft  Range of Motion/Exercises Active  Activity Response Tolerated well  Mobility Referral Yes  $Mobility charge 1 Mobility  Mobility Specialist Start Time (ACUTE ONLY) 1015  Mobility Specialist Stop Time (ACUTE ONLY) 1036  Mobility Specialist Time Calculation (min) (ACUTE ONLY) 21 min   Pt received in bed and agreed to mobility. Was Mod A for bed mobility, mod A for STS using steady. Once standing by recliner pt incontinent of BM, assisted in cleaning and returned pt to chair with all needs met, family in room. Marilynne Halsted Mobility Specialist

## 2023-05-17 NOTE — Plan of Care (Signed)
  Problem: Health Behavior/Discharge Planning: Goal: Ability to manage health-related needs will improve Outcome: Progressing   Problem: Clinical Measurements: Goal: Ability to maintain clinical measurements within normal limits will improve Outcome: Progressing Goal: Respiratory complications will improve Outcome: Progressing Goal: Cardiovascular complication will be avoided Outcome: Progressing   Problem: Activity: Goal: Risk for activity intolerance will decrease Outcome: Progressing   Problem: Coping: Goal: Level of anxiety will decrease Outcome: Progressing

## 2023-05-17 NOTE — TOC Transition Note (Signed)
Transition of Care [facility]) - CM/SW Discharge Note   Patient Details  Name: Alex Frazier MRN: [id number] Date of Birth: 05-Oct-1945  Transition of [facility]) CM/SW Contact:  Darleene Cleaver, LCSW Phone Number: 05/17/2023, 11:27 AM   Clinical Narrative:     Patient will be going home with home health through [facility].  TOC signing off please reconsult with any other TOC needs, home health agency has been notified of planned discharge.  Patient will be going home with home health   Final next level of care: Home w Home Health Services Barriers to Discharge: Barriers Resolved   Patient Goals and CMS Choice CMS Medicare.gov Compare Post Acute Care list provided to:: Patient Choice offered to / list presented to : Patient  Discharge Placement    Home with home health PT and OT.                  Patient and family notified of of transfer: 05/17/23  Discharge Plan and Services Additional resources added to the After Visit Summary for   In-house Referral: NA Discharge Planning Services: NA                      HH Arranged: PT, OT HH Agency: [facility] Date HH Agency Contacted: 05/17/23 Time HH Agency Contacted: 1030 Representative spoke with at [facility] Agency: Morrie Sheldon  Social Determinants of Health (SDOH) Interventions SDOH Screenings   Food Insecurity: No Food Insecurity (05/14/2023)  Housing: Low Risk  (05/14/2023)  Transportation Needs: No Transportation Needs (05/14/2023)  Utilities: Not At Risk (05/14/2023)  Alcohol Screen: Low Risk  (08/19/2022)  Depression (PHQ2-9): Low Risk  (08/19/2022)  Financial Resource Strain: Low Risk  (08/19/2022)  Physical Activity: Inactive (08/19/2022)  Social Connections: Socially Integrated (08/19/2022)  Stress: No Stress Concern Present (08/19/2022)  Tobacco Use: Low Risk  (05/14/2023)  Health Literacy: Inadequate Health Literacy (04/13/2023)     Readmission Risk Interventions     No data to display

## 2023-05-17 NOTE — Plan of Care (Signed)
  Problem: Education: Goal: Knowledge of General Education information will improve Description: Including pain rating scale, medication(s)/side effects and non-pharmacologic comfort measures Outcome: Adequate for Discharge   Problem: Health Behavior/Discharge Planning: Goal: Ability to manage health-related needs will improve Outcome: Adequate for Discharge   Problem: Clinical Measurements: Goal: Ability to maintain clinical measurements within normal limits will improve Outcome: Adequate for Discharge Goal: Will remain free from infection Outcome: Adequate for Discharge Goal: Diagnostic test results will improve Outcome: Adequate for Discharge Goal: Respiratory complications will improve Outcome: Adequate for Discharge Goal: Cardiovascular complication will be avoided Outcome: Adequate for Discharge   Problem: Activity: Goal: Risk for activity intolerance will decrease Outcome: Adequate for Discharge   Problem: Nutrition: Goal: Adequate nutrition will be maintained Outcome: Adequate for Discharge   Problem: Coping: Goal: Level of anxiety will decrease Outcome: Adequate for Discharge   Problem: Elimination: Goal: Will not experience complications related to bowel motility Outcome: Adequate for Discharge Goal: Will not experience complications related to urinary retention Outcome: Adequate for Discharge   Problem: Pain Managment: Goal: General experience of comfort will improve Outcome: Adequate for Discharge   Problem: Safety: Goal: Ability to remain free from injury will improve Outcome: Adequate for Discharge   Problem: Skin Integrity: Goal: Risk for impaired skin integrity will decrease Outcome: Adequate for Discharge   Problem: Education: Goal: Knowledge of risk factors and measures for prevention of condition will improve Outcome: Adequate for Discharge   Problem: Coping: Goal: Psychosocial and spiritual needs will be supported Outcome: Adequate for  Discharge   Problem: Respiratory: Goal: Will maintain a patent airway Outcome: Adequate for Discharge Goal: Complications related to the disease process, condition or treatment will be avoided or minimized Outcome: Adequate for Discharge   Problem: Acute Rehab PT Goals(only PT should resolve) Goal: Pt Will Go Supine/Side To Sit Outcome: Adequate for Discharge Goal: Pt Will Go Sit To Supine/Side Outcome: Adequate for Discharge Goal: Patient Will Transfer Sit To/From Stand Outcome: Adequate for Discharge Goal: Pt Will Transfer Bed To Chair/Chair To Bed Outcome: Adequate for Discharge Goal: Pt Will Ambulate Outcome: Adequate for Discharge

## 2023-05-18 NOTE — Consult Note (Signed)
   Value-Based Care Institute  [facility] Inpatient Consult   05/18/2023  Alex Frazier [birth year] [id number]  [facility] [[facility]]  Accountable Care Organization [ACO] Patient: Medicare ACO REACH  Primary Care Provider:  Donita Brooks, MD [facility] Liaison remote coverage review for patient admitted to and transitioned from [facility]    Patient is currently active with [facility] [[facility]] Care Management for care coordination services.  Patient has been engaged by a [employer]. The community based plan of care has focused on disease management and community resource support.    Plan:  Patient was admitted to [facility] with Altered Mental Status and found to be COVID positive.  Will alert Community RN CC of patient's return home with HH.  Patient has upcoming follow up noted.    Of note, [facility] Care Management services does not replace or interfere with any services that are needed or arranged by inpatient [facility] care management team.   Charlesetta Shanks, RN, BSN, CCM [place]  [facility], [facility] Liaison Direct Dial: [phone] or secure chat Website: [email or web address]

## 2023-05-19 LAB — CULTURE, BLOOD (ROUTINE X 2)
Culture: NO GROWTH
Culture: NO GROWTH
Special Requests: ADEQUATE
Special Requests: ADEQUATE

## 2023-05-19 NOTE — Discharge Summary (Signed)
Physician Discharge Summary   Patient: Alex Frazier MRN: [id number] DOB: 1946/07/07  Admit date:     05/14/2023  Discharge date: 05/17/2023  Discharge Physician: Lynden Oxford  PCP: Donita Brooks, MD  Recommendations at discharge: Follow-up with PCP in 1 week.  Discharge Diagnoses: Principal Problem:   COVID-19 virus infection Active Problems:   Parkinson's disease   Hypocalcemia   Elevated BP without diagnosis of hypertension  Brief hospital course: PMH of Parkinson's disease, HLD, chronic orthostatic hypotension, B12 deficiency.  Presented to hospital with complaints of fatigue and confusion.  Found to have COVID-19 infection.  Suspected UTI as well.  Assessment and Plan: COVID-19 infection. SIRS criteria not met-sepsis ruled out. Proteus UTI Tmax 102.8.  WBC 6.5.  Had some confusion at the time of admission.  Blood pressure was elevated.  No leukocytosis.  Lactic acid mildly elevated.  No evidence of organ injury so far. UA is nitrate negative but has some WBCs. X-ray without any pneumonia. COVID-positive with history of recent exposure. Not hypoxic.  Mildly elevated CRP.  Currently no indication to initiate steroids. Also on Paxlovid which I will continue. Follow-up on blood cultures.  Negative.  Urine culture positive.  Continue oral antibiotic. Avoiding NSAIDs per family request.  Tylenol as needed.  Generalized weakness causing a near fall. Wife was helping him in the bathroom and due to severe weakness patient could not support his weight and wife had to lower him in the bathroom. Wife denies any head injury or neck injury. No focal deficit at the time of my evaluation. Mentation wise patient is close to his baseline now. CT of the head is also unremarkable. PT OT consulted.  Home health recommended. For now we will monitor.  History of orthostatic hypotension. Hypertensive urgency. BP on admission 224/104.  Currently improving but not hypotensive. At baseline  on Florinef 0.2 mg and midodrine 10 mg 3 times daily. Currently holding midodrine. Resuming Florinef but only at 0.05 mg and monitor response.  Parkinson's disease. At risk for autonomic dysfunction. Chronically on dysphagia 3 diet. Resuming Sinemet.  Pending adjusted.  Mood disorder. Continue Prozac.  HLD. Continue statin.  Lactic acidosis. Likely in the setting of poor p.o. intake.  Improving.  Consultants:  none  Procedures performed:  None  DISCHARGE MEDICATION: Allergies as of 05/17/2023       Reactions   Tape Other (See Comments)   SKIN IS VERY THIN- TEARS VERY EASILY!!   Hydrocodone-acetaminophen Other (See Comments)   Hallucinations and confusion   Indomethacin Other (See Comments)   Unknown reaction   Lyrica [pregabalin] Other (See Comments)   Vivid hallucinations   Nsaids Other (See Comments)   Per family patient gets hallucination/confusion.   Quetiapine Other (See Comments)   HYPOtension!!        Medication List     TAKE these medications    acetaminophen 500 MG tablet Commonly known as: TYLENOL Take 2 tablets (1,000 mg total) by mouth 3 (three) times daily.   CALCIUM 600 + D PO Take 1 tablet by mouth daily.   carbidopa-levodopa 25-100 MG tablet Commonly known as: SINEMET IR Take 1 tablet by mouth 3 (three) times daily.   cephALEXin 500 MG capsule Commonly known as: KEFLEX Take 1 capsule (500 mg total) by mouth 3 (three) times daily for 5 days.   fludrocortisone 0.1 MG tablet Commonly known as: FLORINEF Take 1 tablet (0.1 mg total) by mouth daily. What changed: how much to take   FLUoxetine 20 MG  capsule Commonly known as: PROZAC Take 1 capsule (20 mg total) by mouth daily.   midodrine 10 MG tablet Commonly known as: PROAMATINE Take 1 tablet (10 mg total) by mouth 3 (three) times daily as needed (for systolic blood pressure lower than 90). What changed: See the new instructions.   nirmatrelvir/ritonavir 20 x 150 MG & 10 x 100MG   Tabs Commonly known as: PAXLOVID Take 3 tablets by mouth 2 (two) times daily for 2 days. Patient GFR is >60. Take nirmatrelvir (150 mg) two tablets twice daily for 5 days and ritonavir (100 mg) one tablet twice daily for 5 days.   polyethylene glycol powder 17 GM/SCOOP powder Commonly known as: GLYCOLAX/MIRALAX Take 17 g by mouth See admin instructions. Mix 17 grams of powder into 4-8 ounces of prune juice and drink once a day   pravastatin 20 MG tablet Commonly known as: PRAVACHOL Take 1 tablet by mouth daily at 6 PM.   senna-docusate 8.6-50 MG tablet Commonly known as: Senokot-S Take 2 tablets by mouth at bedtime as needed for mild constipation.   VITAMIN B 12 PO Take 1 tablet by mouth in the morning.       Disposition: Home Diet recommendation: Regular diet  Discharge Exam: Vitals:   05/16/23 1954 05/17/23 0636 05/17/23 0800 05/17/23 1206  BP: 115/66 (!) 179/83 (!) 171/81 (!) 144/65  Pulse: 74 60 64 60  Resp: 17   20  Temp: 97.6 F (36.4 C) 97.8 F (36.6 C) 98.1 F (36.7 C) 98.1 F (36.7 C)  TempSrc:  Oral Oral   SpO2: 97% 97% 98% 97%  Weight:       General: Appear in mild distress; no visible Abnormal Neck Mass Or lumps, Conjunctiva normal Cardiovascular: S1 and S2 Present, no Murmur, Respiratory: good respiratory effort, Bilateral Air entry present and CTA, no Crackles, no wheezes Abdomen: Bowel Sound present, Non tender  Extremities: no Pedal edema Neurology: alert and oriented to time, place, and person  Filed Weights   05/15/23 0000  Weight: 68 kg   Condition at discharge: stable  The results of significant diagnostics from this hospitalization (including imaging, microbiology, ancillary and laboratory) are listed below for reference.   Imaging Studies: CT HEAD WO CONTRAST ( )  Result Date: 05/14/2023 CLINICAL DATA:  Positive home UTI test. Altered mental status. More confused and fatigue past few days. EXAM: CT HEAD WITHOUT CONTRAST TECHNIQUE:  Contiguous axial images were obtained from the base of the skull through the vertex without intravenous contrast. RADIATION DOSE REDUCTION: This exam was performed according to the departmental dose-optimization program which includes automated exposure control, adjustment of the mA and/or kV according to patient size and/or use of iterative reconstruction technique. COMPARISON:  CT head 03/17/2020 FINDINGS: Brain: No intracranial hemorrhage, mass effect, or evidence of acute infarct. No hydrocephalus. No extra-axial fluid collection. Age-commensurate cerebral atrophy and chronic small vessel ischemic disease. Vascular: No hyperdense vessel. Intracranial arterial calcification. Skull: No fracture or focal lesion. Sinuses/Orbits: No acute finding. Mucosal thickening in the ethmoid air cells. Other: None. IMPRESSION: No acute intracranial abnormality. Electronically Signed   By: Minerva Fester M.D.   On: 05/14/2023 21:52   DG Chest Port 1 View  Result Date: 05/14/2023 CLINICAL DATA:  Questionable sepsis-evaluate for abnormality. Positive at home UTI test. More confused and fatigued in the past days. EXAM: PORTABLE CHEST 1 VIEW COMPARISON:  Radiographs 03/17/2020 FINDINGS: Stable cardiomediastinal silhouette. Aortic atherosclerotic calcification. Low lung volumes accentuate pulmonary vascularity. No focal consolidation, pleural effusion, or pneumothorax. No  displaced rib fractures. IMPRESSION: No acute cardiopulmonary disease. Electronically Signed   By: Minerva Fester M.D.   On: 05/14/2023 20:50    Microbiology: Results for orders placed or performed during the hospital encounter of 05/14/23  Resp panel by RT-PCR (RSV, Flu A&B, Covid) Anterior Nasal Swab     Status: Abnormal   Collection Time: 05/14/23  7:01 PM   Specimen: Anterior Nasal Swab  Result Value Ref Range Status   SARS Coronavirus 2 by RT PCR POSITIVE (A) NEGATIVE Final    Comment: (NOTE) SARS-CoV-2 target nucleic acids are DETECTED.  The  SARS-CoV-2 RNA is generally detectable in upper respiratory specimens during the acute phase of infection. Positive results are indicative of the presence of the identified virus, but do not rule out bacterial infection or co-infection with other pathogens not detected by the test. Clinical correlation with patient history and other diagnostic information is necessary to determine patient infection status. The expected result is Negative.  Fact Sheet for Patients: BloggerCourse.com  Fact Sheet for Healthcare Providers: SeriousBroker.it  This test is not yet approved or cleared by the [place] FDA and  has been authorized for detection and/or diagnosis of SARS-CoV-2 by FDA under an Emergency Use Authorization (EUA).  This EUA will remain in effect (meaning this test can be used) for the duration of  the COVID-19 declaration under Section 564(b)(1) of the A ct, 21 U.S.C. section 360bbb-3(b)(1), unless the authorization is terminated or revoked sooner.     Influenza A by PCR NEGATIVE NEGATIVE Final   Influenza B by PCR NEGATIVE NEGATIVE Final    Comment: (NOTE) The Xpert Xpress SARS-CoV-2/FLU/RSV plus assay is intended as an aid in the diagnosis of influenza from Nasopharyngeal swab specimens and should not be used as a sole basis for treatment. Nasal washings and aspirates are unacceptable for Xpert Xpress SARS-CoV-2/FLU/RSV testing.  Fact Sheet for Patients: BloggerCourse.com  Fact Sheet for Healthcare Providers: SeriousBroker.it  This test is not yet approved or cleared by the [place] FDA and has been authorized for detection and/or diagnosis of SARS-CoV-2 by FDA under an Emergency Use Authorization (EUA). This EUA will remain in effect (meaning this test can be used) for the duration of the COVID-19 declaration under Section 564(b)(1) of the Act, 21 U.S.C. section  360bbb-3(b)(1), unless the authorization is terminated or revoked.     Resp Syncytial Virus by PCR NEGATIVE NEGATIVE Final    Comment: (NOTE) Fact Sheet for Patients: BloggerCourse.com  Fact Sheet for Healthcare Providers: SeriousBroker.it  This test is not yet approved or cleared by the [place] FDA and has been authorized for detection and/or diagnosis of SARS-CoV-2 by FDA under an Emergency Use Authorization (EUA). This EUA will remain in effect (meaning this test can be used) for the duration of the COVID-19 declaration under Section 564(b)(1) of the Act, 21 U.S.C. section 360bbb-3(b)(1), unless the authorization is terminated or revoked.  Performed at [facility], [address] [place], [place] [address]   Blood Culture (routine x 2)     Status: None   Collection Time: 05/14/23  7:01 PM   Specimen: BLOOD LEFT FOREARM  Result Value Ref Range Status   Specimen Description   Final    BLOOD LEFT FOREARM Performed at [facility], [address] [place], [place] [address]    Special Requests   Final    BOTTLES DRAWN AEROBIC AND ANAEROBIC Blood Culture adequate volume Performed at [facility], [address] [place], [place] [address]    Culture   Final    NO GROWTH 5 DAYS Performed at [facility], [address] [place], [place] [address]    Report Status 05/19/2023 FINAL  Final  Urine Culture     Status: Abnormal   Collection Time: 05/14/23  7:24 PM   Specimen: Urine, Random  Result Value Ref Range Status   Specimen Description   Final    URINE, RANDOM Performed at [facility], [address] [place], [place] [address]    Special Requests   Final    NONE Reflexed from [id number] Performed at [facility], [address] [place], [place] [address]    Culture >=100,000 COLONIES/mL PROTEUS MIRABILIS (A)  Final   Report  Status 05/17/2023 FINAL  Final   Organism ID, Bacteria PROTEUS MIRABILIS (A)  Final      Susceptibility   Proteus mirabilis - MIC*    AMPICILLIN <=2 SENSITIVE Sensitive     CEFAZOLIN <=4 SENSITIVE Sensitive     CEFEPIME <=0.12 SENSITIVE Sensitive     CEFTRIAXONE <=0.25 SENSITIVE Sensitive     CIPROFLOXACIN <=0.25 SENSITIVE Sensitive     GENTAMICIN <=1 SENSITIVE Sensitive     IMIPENEM 4 SENSITIVE Sensitive     NITROFURANTOIN RESISTANT Resistant     TRIMETH/SULFA >=320 RESISTANT Resistant     AMPICILLIN/SULBACTAM <=2 SENSITIVE Sensitive     PIP/TAZO <=4 SENSITIVE Sensitive     * >=100,000 COLONIES/mL PROTEUS MIRABILIS  Blood Culture (routine x 2)     Status: None   Collection Time: 05/14/23  7:29 PM   Specimen: BLOOD  Result Value Ref Range Status   Specimen Description   Final    BLOOD RIGHT ANTECUBITAL Performed at [facility], [address] [place], [place] [address]    Special Requests   Final    BOTTLES DRAWN AEROBIC AND ANAEROBIC Blood Culture adequate volume Performed at [facility], [address] [place], [place] [address]    Culture   Final    NO GROWTH 5 DAYS Performed at [facility], [address], [place], [place] [address]    Report Status 05/19/2023 FINAL  Final   Labs: CBC: Recent Labs  Lab 05/14/23 1901 05/16/23 0948  WBC 6.5 2.8*  NEUTROABS 5.0 1.6*  HGB 14.7 13.5  HCT 45.1 40.8  MCV 90.0 90.1  PLT 221 156   Basic Metabolic Panel: Recent Labs  Lab 05/14/23 1901 05/15/23 0527 05/16/23 0948  NA 135 133* 136  K 3.9 3.6 3.6  CL 99 100 100  CO2 25 23 27   GLUCOSE 120* 89 149*  BUN 18 17 16   CREATININE 0.93 0.73 0.79  CALCIUM 8.8* 8.3* 8.3*  MG  --   --  1.9   Liver Function Tests: Recent Labs  Lab 05/14/23 1901 05/16/23 0948  AST 27 55*  ALT 13 21  ALKPHOS 110 85  BILITOT 0.6 0.5  PROT 7.6 6.1*  ALBUMIN 4.1 3.3*   CBG: No results for input(s): "GLUCAP" in the last 168  hours.  Discharge time spent: greater than 30 minutes.  Author: Lynden Oxford, MD  [facility] 05/17/2023

## 2023-05-20 ENCOUNTER — Telehealth: Payer: Self-pay

## 2023-05-20 NOTE — Transitions of Care (Post Inpatient/ED Visit) (Signed)
05/20/2023  Name: Alex Frazier MRN: [id number] DOB: [birth year]  Today's TOC FU Call Status: Today's TOC FU Call Status:: Successful TOC FU Call Completed TOC FU Call Complete Date: 05/20/23 Patient's Name and Date of Birth confirmed.  Transition Care Management Follow-up Telephone Call Date of Discharge: 05/17/23 Discharge Facility: [facility]) Type of Discharge: Inpatient Admission Primary Inpatient Discharge Diagnosis:: Covid-19 How have you been since you were released from the hospital?: Better (Per [name], patients spouse) Any questions or concerns?: Yes Patient Questions/Concerns:: Patient has become very immobile since he was admitted with Covid Patient Questions/Concerns Addressed: Other: (Call placed to [facility] and they will see patient today or tomorrow)  Items Reviewed: Did you receive and understand the discharge instructions provided?: Yes Medications obtained,verified, and reconciled?: Yes (Medications Reviewed) Any new allergies since your discharge?: No Dietary orders reviewed?: No Do you have support at home?: Yes People in Home: spouse Name of Support/Comfort Primary Source: [name]  Medications Reviewed Today: Medications Reviewed Today     Reviewed by Jodelle Gross, RN (Case Manager) on 05/20/23 at 1010  Med List Status: <None>   Medication Order Taking? Sig Documenting Provider Last Dose Status Informant  acetaminophen (TYLENOL) 500 MG tablet [id number] Yes Take 2 tablets (1,000 mg total) by mouth 3 (three) times daily. Dorcas Carrow, MD Taking Active Spouse/Significant Other, Pharmacy Records  Calcium Carb-Cholecalciferol (CALCIUM 600 + D PO) [id number] Yes Take 1 tablet by mouth daily. [provider] Taking Active   carbidopa-levodopa (SINEMET IR) 25-100 MG tablet [id number] Yes Take 1 tablet by mouth 3 (three) times daily. [provider] Taking Active Spouse/Significant Other, Pharmacy Records           Med Note Deloria Lair,  DUROJAHYE' R   Thu May 14, 2023  9:27 PM)    cephALEXin (KEFLEX) 500 MG capsule [id number] Yes Take 1 capsule (500 mg total) by mouth 3 (three) times daily for 5 days. Rolly Salter, MD Taking Active   Cyanocobalamin (VITAMIN B 12 PO) [id number] Yes Take 1 tablet by mouth in the morning.  [provider] Taking Active Spouse/Significant Other, Pharmacy Records  fludrocortisone (FLORINEF) 0.1 MG tablet [id number] Yes Take 1 tablet (0.1 mg total) by mouth daily. Rolly Salter, MD Taking Active   FLUoxetine [facility]) 20 MG capsule [id number] Yes Take 1 capsule (20 mg total) by mouth daily. Donita Brooks, MD Taking Active Spouse/Significant Other, Pharmacy Records  midodrine (PROAMATINE) 10 MG tablet [id number] Yes Take 1 tablet (10 mg total) by mouth 3 (three) times daily as needed (for systolic blood pressure lower than 90). Rolly Salter, MD Taking Active   polyethylene glycol powder [facility]) 17 GM/SCOOP powder [id number] Yes Take 17 g by mouth See admin instructions. Mix 17 grams of powder into 4-8 ounces of prune juice and drink once a day [provider] Taking Active Spouse/Significant Other, Pharmacy Records  pravastatin (PRAVACHOL) 20 MG tablet [id number] Yes Take 1 tablet by mouth daily at 6 PM. Donita Brooks, MD Taking Active Spouse/Significant Other, Pharmacy Records  senna-docusate (SENOKOT-S) 8.6-50 MG tablet [id number] Yes Take 2 tablets by mouth at bedtime as needed for mild constipation. Donita Brooks, MD Taking Active Spouse/Significant Other, Pharmacy Records            Home Care and Equipment/Supplies: Were Home Health Services Ordered?: Yes Name of Home Health Agency:: [facility] Has Agency set up a time to come to your home?: No EMR reviewed for Home Health Orders:  (Spoke to [place]  at [facility] and they will see patient today or tomorrow) Any new equipment or medical supplies ordered?: No  Functional Questionnaire: Do you need assistance with  bathing/showering or dressing?: Yes Do you need assistance with meal preparation?: Yes Do you need assistance with eating?: No Do you have difficulty maintaining continence: No Do you need assistance with getting out of bed/getting out of a chair/moving?: Yes Do you have difficulty managing or taking your medications?: Yes  Follow up appointments reviewed: PCP Follow-up appointment confirmed?: No MD Provider Line Number:[phone] Given: No (Patient's spouse to call and set up HFU.  She needs patient to be mobile in order to bring him to appt.) Specialist Hospital Follow-up appointment confirmed?: NA Do you need transportation to your follow-up appointment?: No Do you understand care options if your condition(s) worsen?: Yes-patient verbalized understanding  SDOH Interventions Today    Flowsheet Row Most Recent Value  SDOH Interventions   Food Insecurity Interventions Intervention Not Indicated  Housing Interventions Intervention Not Indicated  Transportation Interventions Intervention Not Indicated      TOC Interventions Today    Flowsheet Row Most Recent Value  TOC Interventions   TOC Interventions Discussed/Reviewed TOC Interventions Discussed, TOC Interventions Reviewed, Contacted Home Health RN/OT/PT        Jodelle Gross RN, BSN, CCM [facility] RN Care Coordinator/ Transitions of Care Direct Dial: [phone]  Fax: [phone]

## 2023-05-21 ENCOUNTER — Ambulatory Visit: Payer: Self-pay | Admitting: *Deleted

## 2023-05-21 DIAGNOSIS — E7800 Pure hypercholesterolemia, unspecified: Secondary | ICD-10-CM | POA: Diagnosis not present

## 2023-05-21 DIAGNOSIS — F32A Depression, unspecified: Secondary | ICD-10-CM | POA: Diagnosis not present

## 2023-05-21 DIAGNOSIS — I951 Orthostatic hypotension: Secondary | ICD-10-CM | POA: Diagnosis not present

## 2023-05-21 DIAGNOSIS — G20A1 Parkinson's disease without dyskinesia, without mention of fluctuations: Secondary | ICD-10-CM | POA: Diagnosis not present

## 2023-05-21 DIAGNOSIS — G629 Polyneuropathy, unspecified: Secondary | ICD-10-CM | POA: Diagnosis not present

## 2023-05-21 DIAGNOSIS — E538 Deficiency of other specified B group vitamins: Secondary | ICD-10-CM | POA: Diagnosis not present

## 2023-05-21 NOTE — Patient Outreach (Signed)
  Care Coordination   Follow Up Visit Note   11/18/2023 updated note for 05/21/23 Name: Alex Frazier MRN: [id number] DOB: 1946-01-04  Alex Frazier is a 77 y.o. year old male who sees Pickard, Alex [place], MD for primary care. I spoke with  Alex Frazier by phone today.  What matters to the patients health and wellness today?  Stedy used in hospital - interested in purchasing one Difficulty getting patient a pcp follow up visit  Confirmed patient and wife has covid. An UTI  Has a Hoyer lift at this time    Goals Addressed             This Visit's Progress    Home management of falls, poor ambulation, recurrent uti , parkinson (Care coordination services)       Care Coordination Interventions: Evaluation of current treatment plan related to Parkinson's disease, UTI and patient's adherence to plan as established by provider  Care giver goal: Alex Frazier will check patient for possible UTI using purchased test strips and seek care as needed  Interventions Today    Flowsheet Row Most Recent Value  Chronic Disease   Chronic disease during today's visit Other  [mobility/want lift (parkinson, back ), difficulty getting to pcp office, Covid/UTI]  General Interventions   General Interventions Discussed/Reviewed General Interventions Reviewed, Durable Medical Equipment (DME), Doctor Visits  Doctor Visits Discussed/Reviewed Doctor Visits Reviewed, PCP  Durable Medical Equipment (DME) Other  [voiced interested in a Stedy]  PCP/Specialist Visits Compliance with follow-up visit  Exercise Interventions   Exercise Discussed/Reviewed Exercise Reviewed, Physical Activity, Assistive device use and maintanence  Education Interventions   Education Provided Provided Education  Provided Verbal Education On Other, Veterinary surgeon medical equipment]  Mental Health Interventions   Mental Health Discussed/Reviewed Mental Health Reviewed, Coping Strategies  Pharmacy Interventions   Pharmacy  Dicussed/Reviewed Pharmacy Topics Reviewed, Affording Medications  Safety Interventions   Safety Discussed/Reviewed Safety Reviewed, Home Safety, Fall Risk  Home Safety Assistive Devices                 SDOH assessments and interventions completed:  No     Care Coordination Interventions:  Yes, provided   Follow up plan: Follow up call scheduled for 11/24/23     Encounter Outcome:  Patient Visit Completed    Alex Bradford L. Noelle Penner, RN, BSN, CCM, Care Management Coordinator [phone]

## 2023-05-21 NOTE — Patient Instructions (Addendum)
 Visit Information  Thank you for taking time to visit with me today. Please don't hesitate to contact me if I can be of assistance to you.   Following are the goals we discussed today:   Goals Addressed             This Visit's Progress    Home management of falls, poor ambulation, recurrent uti , parkinson (Care coordination services)       Care Coordination Interventions: Evaluation of current treatment plan related to Parkinson's disease, UTI and patient's adherence to plan as established by provider  Care giver goal: Mrs Nygard will check patient for possible UTI using purchased test strips and seek care as needed  Interventions Today    Flowsheet Row Most Recent Value  Chronic Disease   Chronic disease during today's visit Other  [mobility/want lift (parkinson, back ), difficulty getting to pcp office, Covid/UTI]  General Interventions   General Interventions Discussed/Reviewed General Interventions Reviewed, Durable Medical Equipment (DME), Doctor Visits  Doctor Visits Discussed/Reviewed Doctor Visits Reviewed, PCP  Durable Medical Equipment (DME) Other  [voiced interested in a Stedy]  PCP/Specialist Visits Compliance with follow-up visit  Exercise Interventions   Exercise Discussed/Reviewed Exercise Reviewed, Physical Activity, Assistive device use and maintanence  Education Interventions   Education Provided Provided Education  Provided Verbal Education On Other, Veterinary surgeon medical equipment]  Mental Health Interventions   Mental Health Discussed/Reviewed Mental Health Reviewed, Coping Strategies  Pharmacy Interventions   Pharmacy Dicussed/Reviewed Pharmacy Topics Reviewed, Affording Medications  Safety Interventions   Safety Discussed/Reviewed Safety Reviewed, Home Safety, Fall Risk  Home Safety Assistive Devices                 Our next appointment is by telephone on 11/24/23 at 2:30 pm  Please call the care guide team at [phone] if you  need to cancel or reschedule your appointment.   If you are experiencing a Mental Health or Behavioral Health Crisis or need someone to talk to, please call the Suicide and Crisis Lifeline: 988 call the [place] National Suicide Prevention Lifeline: 224 144 9079 or TTY: 607-258-3062 TTY (530) 141-4587) to talk to a trained counselor call 1-800-273-TALK (toll free, 24 hour hotline) call the [facility]: ([phone])755-3408 call 911   Patient verbalizes understanding of instructions and care plan provided today and agrees to view in MyChart. Active MyChart status and patient understanding of how to access instructions and care plan via MyChart confirmed with patient.     The patient has been provided with contact information for the care management team and has been advised to call with any health related questions or concerns.   Hasina Kreager L. Noelle Penner, RN, BSN, CCM, Care Management Coordinator [phone]

## 2023-05-28 DIAGNOSIS — E538 Deficiency of other specified B group vitamins: Secondary | ICD-10-CM | POA: Diagnosis not present

## 2023-05-28 DIAGNOSIS — I951 Orthostatic hypotension: Secondary | ICD-10-CM | POA: Diagnosis not present

## 2023-05-28 DIAGNOSIS — G20A1 Parkinson's disease without dyskinesia, without mention of fluctuations: Secondary | ICD-10-CM | POA: Diagnosis not present

## 2023-05-28 DIAGNOSIS — E7800 Pure hypercholesterolemia, unspecified: Secondary | ICD-10-CM | POA: Diagnosis not present

## 2023-05-28 DIAGNOSIS — F32A Depression, unspecified: Secondary | ICD-10-CM | POA: Diagnosis not present

## 2023-05-28 DIAGNOSIS — G629 Polyneuropathy, unspecified: Secondary | ICD-10-CM | POA: Diagnosis not present

## 2023-06-02 DIAGNOSIS — E538 Deficiency of other specified B group vitamins: Secondary | ICD-10-CM | POA: Diagnosis not present

## 2023-06-02 DIAGNOSIS — N390 Urinary tract infection, site not specified: Secondary | ICD-10-CM | POA: Diagnosis not present

## 2023-06-02 DIAGNOSIS — M5030 Other cervical disc degeneration, unspecified cervical region: Secondary | ICD-10-CM | POA: Diagnosis not present

## 2023-06-02 DIAGNOSIS — G20A1 Parkinson's disease without dyskinesia, without mention of fluctuations: Secondary | ICD-10-CM | POA: Diagnosis not present

## 2023-06-02 DIAGNOSIS — G629 Polyneuropathy, unspecified: Secondary | ICD-10-CM | POA: Diagnosis not present

## 2023-06-02 DIAGNOSIS — Z556 Problems related to health literacy: Secondary | ICD-10-CM | POA: Diagnosis not present

## 2023-06-02 DIAGNOSIS — Z8673 Personal history of transient ischemic attack (TIA), and cerebral infarction without residual deficits: Secondary | ICD-10-CM | POA: Diagnosis not present

## 2023-06-02 DIAGNOSIS — I951 Orthostatic hypotension: Secondary | ICD-10-CM | POA: Diagnosis not present

## 2023-06-02 DIAGNOSIS — U071 COVID-19: Secondary | ICD-10-CM | POA: Diagnosis not present

## 2023-06-02 DIAGNOSIS — F32A Depression, unspecified: Secondary | ICD-10-CM | POA: Diagnosis not present

## 2023-06-02 DIAGNOSIS — Z9049 Acquired absence of other specified parts of digestive tract: Secondary | ICD-10-CM | POA: Diagnosis not present

## 2023-06-02 DIAGNOSIS — E7800 Pure hypercholesterolemia, unspecified: Secondary | ICD-10-CM | POA: Diagnosis not present

## 2023-06-05 DIAGNOSIS — E7800 Pure hypercholesterolemia, unspecified: Secondary | ICD-10-CM | POA: Diagnosis not present

## 2023-06-05 DIAGNOSIS — E538 Deficiency of other specified B group vitamins: Secondary | ICD-10-CM | POA: Diagnosis not present

## 2023-06-05 DIAGNOSIS — I951 Orthostatic hypotension: Secondary | ICD-10-CM | POA: Diagnosis not present

## 2023-06-05 DIAGNOSIS — G629 Polyneuropathy, unspecified: Secondary | ICD-10-CM | POA: Diagnosis not present

## 2023-06-05 DIAGNOSIS — F32A Depression, unspecified: Secondary | ICD-10-CM | POA: Diagnosis not present

## 2023-06-05 DIAGNOSIS — G20A1 Parkinson's disease without dyskinesia, without mention of fluctuations: Secondary | ICD-10-CM | POA: Diagnosis not present

## 2023-06-08 DIAGNOSIS — G629 Polyneuropathy, unspecified: Secondary | ICD-10-CM | POA: Diagnosis not present

## 2023-06-08 DIAGNOSIS — I951 Orthostatic hypotension: Secondary | ICD-10-CM | POA: Diagnosis not present

## 2023-06-08 DIAGNOSIS — F32A Depression, unspecified: Secondary | ICD-10-CM | POA: Diagnosis not present

## 2023-06-08 DIAGNOSIS — E538 Deficiency of other specified B group vitamins: Secondary | ICD-10-CM | POA: Diagnosis not present

## 2023-06-08 DIAGNOSIS — E7800 Pure hypercholesterolemia, unspecified: Secondary | ICD-10-CM | POA: Diagnosis not present

## 2023-06-08 DIAGNOSIS — G20A1 Parkinson's disease without dyskinesia, without mention of fluctuations: Secondary | ICD-10-CM | POA: Diagnosis not present

## 2023-06-09 ENCOUNTER — Ambulatory Visit: Payer: Medicare Other | Admitting: Family Medicine

## 2023-06-09 VITALS — BP 120/62 | HR 68 | Temp 98.2°F

## 2023-06-09 DIAGNOSIS — R410 Disorientation, unspecified: Secondary | ICD-10-CM | POA: Diagnosis not present

## 2023-06-09 DIAGNOSIS — Z23 Encounter for immunization: Secondary | ICD-10-CM | POA: Diagnosis not present

## 2023-06-09 LAB — URINALYSIS, ROUTINE W REFLEX MICROSCOPIC
Bacteria, UA: NONE SEEN /[HPF]
Bilirubin Urine: NEGATIVE
Glucose, UA: NEGATIVE
Hgb urine dipstick: NEGATIVE
Hyaline Cast: NONE SEEN /[LPF]
Ketones, ur: NEGATIVE
Nitrite: NEGATIVE
RBC / HPF: NONE SEEN /[HPF] (ref 0–2)
Specific Gravity, Urine: 1.02 (ref 1.001–1.035)
Squamous Epithelial / HPF: NONE SEEN /[HPF] (ref <=5)
pH: 7 (ref 5.0–8.0)

## 2023-06-09 LAB — MICROSCOPIC MESSAGE

## 2023-06-09 MED ORDER — CEPHALEXIN 500 MG PO CAPS
500.0000 mg | ORAL_CAPSULE | Freq: Three times a day (TID) | ORAL | 0 refills | Status: DC
Start: 2023-06-09 — End: 2023-12-08

## 2023-06-09 NOTE — Addendum Note (Signed)
Addended by: Venia Carbon K on: 06/09/2023 04:28 PM   Modules accepted: Orders

## 2023-06-09 NOTE — Progress Notes (Addendum)
Subjective:    Patient ID: Alex Frazier, male    DOB: 03/31/1946, 77 y.o.   MRN: [id number]  HPI Patient was recently hospitalized with COVID as well as a urinary tract infection.  Recently he has been doing much better.  He was starting to ambulate around the home using his rollator.  Physical therapy was impressed with his progress.  However over the last 48 hours, his wife states that he become more confused.  Today he is unresponsive.  He will stare at me but is unable to answer questions more than a yes or no.  He is unable to stand.  He is very weak.  His wife states that she is having to "manhandle" to maneuver him around the home.  Today I had to physically pick the patient up to put him on the exam table in order to get a catheterized urine sample.  However he denies any cough.  He denies any shortness of breath.  He denies any chest pain.  He denies any abdominal pain.  However his review of systems is questionable given his underlying confusion.  The concern is that he may be getting sick again.  Past Medical History:  Diagnosis Date   B12 deficiency    Borderline   Bulging discs    Degenerative disc disease, cervical    Gastritis    Hyperlipidemia    Neuropathy    Parkinson's disease    Parkinsonian syndrome    Peripheral neuropathy    TIA (transient ischemic attack)    TIA symptoms from hypotension   Past Surgical History:  Procedure Laterality Date   APPENDECTOMY     BACK SURGERY     Current Outpatient Medications on File Prior to Visit  Medication Sig Dispense Refill   acetaminophen (TYLENOL) 500 MG tablet Take 2 tablets (1,000 mg total) by mouth 3 (three) times daily. 30 tablet 0   Calcium Carb-Cholecalciferol (CALCIUM 600 + D PO) Take 1 tablet by mouth daily.     carbidopa-levodopa (SINEMET IR) 25-100 MG tablet Take 1 tablet by mouth 3 (three) times daily.     Cyanocobalamin (VITAMIN B 12 PO) Take 1 tablet by mouth in the morning.      fludrocortisone (FLORINEF)  0.1 MG tablet Take 1 tablet (0.1 mg total) by mouth daily.     FLUoxetine (PROZAC) 20 MG capsule Take 1 capsule (20 mg total) by mouth daily. 90 capsule 3   midodrine (PROAMATINE) 10 MG tablet Take 1 tablet (10 mg total) by mouth 3 (three) times daily as needed (for systolic blood pressure lower than 90).     polyethylene glycol powder (GLYCOLAX/MIRALAX) 17 GM/SCOOP powder Take 17 g by mouth See admin instructions. Mix 17 grams of powder into 4-8 ounces of prune juice and drink once a day     pravastatin (PRAVACHOL) 20 MG tablet Take 1 tablet by mouth daily at 6 PM. 90 tablet 3   senna-docusate (SENOKOT-S) 8.6-50 MG tablet Take 2 tablets by mouth at bedtime as needed for mild constipation. 60 tablet 2   No current facility-administered medications on file prior to visit.   Allergies  Allergen Reactions   Tape Other (See Comments)    SKIN IS VERY THIN- TEARS VERY EASILY!!   Hydrocodone-Acetaminophen Other (See Comments)    Hallucinations and confusion   Indomethacin Other (See Comments)    Unknown reaction   Lyrica [Pregabalin] Other (See Comments)    Vivid hallucinations   Nsaids Other (See Comments)  Per family patient gets hallucination/confusion.   Quetiapine Other (See Comments)    HYPOtension!!   Social History   Socioeconomic History   Marital status: Married    Spouse name: Not on file   Number of children: 3   Years of education: Not on file   Highest education level: High school graduate  Occupational History   Not on file  Tobacco Use   Smoking status: Never   Smokeless tobacco: Never  Vaping Use   Vaping status: Never Used  Substance and Sexual Activity   Alcohol use: No   Drug use: No   Sexual activity: Not on file  Other Topics Concern   Not on file  Social History Narrative   Lives at home with his wife   Right handed   Caffeine: none   Social Determinants of Health   Financial Resource Strain: Low Risk  (08/19/2022)   Overall Financial Resource  Strain (CARDIA)    Difficulty of Paying Living Expenses: Not hard at all  Food Insecurity: No Food Insecurity (05/20/2023)   Hunger Vital Sign    Worried About Running Out of Food in the Last Year: Never true    Ran Out of Food in the Last Year: Never true  Transportation Needs: No Transportation Needs (05/20/2023)   PRAPARE - Administrator, Civil Service (Medical): No    Lack of Transportation (Non-Medical): No  Physical Activity: Inactive (08/19/2022)   Exercise Vital Sign    Days of Exercise per Week: 0 days    Minutes of Exercise per Session: 0 min  Stress: No Stress Concern Present (08/19/2022)   [employer] of Occupational Health - Occupational Stress Questionnaire    Feeling of Stress : Not at all  Social Connections: Socially Integrated (08/19/2022)   Social Connection and Isolation Panel [NHANES]    Frequency of Communication with Friends and Family: More than three times a week    Frequency of Social Gatherings with Friends and Family: Three times a week    Attends Religious Services: 1 to 4 times per year    Active Member of Clubs or Organizations: Yes    Attends Banker Meetings: 1 to 4 times per year    Marital Status: Married  Catering manager Violence: Not At Risk (05/14/2023)   Humiliation, Afraid, Rape, and Kick questionnaire    Fear of Current or Ex-Partner: No    Emotionally Abused: No    Physically Abused: No    Sexually Abused: No     Review of Systems  All other systems reviewed and are negative.      Objective:   Physical Exam Constitutional:      Appearance: Normal appearance. He is well-groomed and normal weight. He is not ill-appearing or toxic-appearing.  HENT:     Mouth/Throat:     Mouth: Mucous membranes are moist.     Pharynx: No oropharyngeal exudate or posterior oropharyngeal erythema.  Eyes:     Extraocular Movements: Extraocular movements intact.     Pupils: Pupils are equal, round, and reactive to light.   Cardiovascular:     Rate and Rhythm: Normal rate and regular rhythm.     Heart sounds: Normal heart sounds.  Pulmonary:     Effort: Pulmonary effort is normal. No respiratory distress.     Breath sounds: Normal breath sounds. No stridor. No wheezing, rhonchi or rales.  Chest:     Chest wall: No tenderness.  Abdominal:     General: Abdomen  is flat. Bowel sounds are normal. There is no distension.     Palpations: Abdomen is soft.     Tenderness: There is no abdominal tenderness. There is no guarding.     Hernia: No hernia is present.  Musculoskeletal:        General: No tenderness.     Right lower leg: No edema.     Left lower leg: No edema.  Skin:    Findings: Rash present. No erythema.  Neurological:     Mental Status: He is alert and oriented to person, place, and time. Mental status is at baseline.     Motor: Weakness present.     Coordination: Coordination abnormal.     Gait: Gait abnormal.  Psychiatric:        Attention and Perception: Attention normal.        Mood and Affect: Mood is depressed. Affect is tearful.        Speech: Speech is delayed.        Behavior: Behavior is slowed and withdrawn.        Cognition and Memory: Memory is impaired.    Patient is confined to a wheelchair. There is no evidence of cellulitis on his scrotum.  There is no evidence of cellulitis on his gluteus.  There are no bedsores.  There is no strokelike neurologic deficit.     Assessment & Plan:  Delirium - Plan: Urinalysis, Routine w reflex microscopic, Urine Culture, CBC with Differential/Platelet, COMPLETE METABOLIC PANEL WITH GFR I will check baseline lab work.  There is no obvious source of infection today.  There is no sign of a stroke.  He does not appear dehydrated or toxic.  I will send a urinalysis and a urine culture to ensure resolution of his UTI.  Urinalysis shows +2 leukocyte esterase.  Otherwise is normal.  I will treat the patient empirically for a urinary tract infection with  Keflex 500 mg 3 times daily for 7 days and await the results of his urine culture.   Patient also needs bilateral AFO's.  He has muscle weakness and instability secondary to his Parkinson's Disease.  He is able to stand and ambulate with assistance from his family to transfer from bed to chair and to go to restroom.  He uses a walker as well but has persistent toe dragging.  Bilateral carbon fiber AFO's are needed to prevent falls and provide more stability with ambulation and improve his function to perform ADLs.  He has responded to AFO's in the past.

## 2023-06-10 DIAGNOSIS — G629 Polyneuropathy, unspecified: Secondary | ICD-10-CM | POA: Diagnosis not present

## 2023-06-10 DIAGNOSIS — G20A1 Parkinson's disease without dyskinesia, without mention of fluctuations: Secondary | ICD-10-CM | POA: Diagnosis not present

## 2023-06-10 DIAGNOSIS — I951 Orthostatic hypotension: Secondary | ICD-10-CM | POA: Diagnosis not present

## 2023-06-10 DIAGNOSIS — F32A Depression, unspecified: Secondary | ICD-10-CM | POA: Diagnosis not present

## 2023-06-10 DIAGNOSIS — E538 Deficiency of other specified B group vitamins: Secondary | ICD-10-CM | POA: Diagnosis not present

## 2023-06-10 DIAGNOSIS — E7800 Pure hypercholesterolemia, unspecified: Secondary | ICD-10-CM | POA: Diagnosis not present

## 2023-06-11 ENCOUNTER — Telehealth: Payer: Self-pay

## 2023-06-11 DIAGNOSIS — E538 Deficiency of other specified B group vitamins: Secondary | ICD-10-CM | POA: Diagnosis not present

## 2023-06-11 DIAGNOSIS — G20A1 Parkinson's disease without dyskinesia, without mention of fluctuations: Secondary | ICD-10-CM | POA: Diagnosis not present

## 2023-06-11 DIAGNOSIS — F32A Depression, unspecified: Secondary | ICD-10-CM | POA: Diagnosis not present

## 2023-06-11 DIAGNOSIS — E7800 Pure hypercholesterolemia, unspecified: Secondary | ICD-10-CM | POA: Diagnosis not present

## 2023-06-11 DIAGNOSIS — I951 Orthostatic hypotension: Secondary | ICD-10-CM | POA: Diagnosis not present

## 2023-06-11 DIAGNOSIS — G629 Polyneuropathy, unspecified: Secondary | ICD-10-CM | POA: Diagnosis not present

## 2023-06-11 NOTE — Telephone Encounter (Signed)
Pt's wife called in wanting to speak with nurse about pt getting labs. Pt was seen recently by pcp this week and did provide a urine sample but was unsure if pcp asked for labs. Pt's wife stated that pt seems to be doing better now, and doesn't thing pt needs labs anymore unless pcp is requiring them. Please advise.  Cb#: [phone]

## 2023-06-12 LAB — URINE CULTURE
MICRO NUMBER:: 15536435
SPECIMEN QUALITY:: ADEQUATE

## 2023-06-15 DIAGNOSIS — I951 Orthostatic hypotension: Secondary | ICD-10-CM | POA: Diagnosis not present

## 2023-06-15 DIAGNOSIS — G20A1 Parkinson's disease without dyskinesia, without mention of fluctuations: Secondary | ICD-10-CM | POA: Diagnosis not present

## 2023-06-15 DIAGNOSIS — F32A Depression, unspecified: Secondary | ICD-10-CM | POA: Diagnosis not present

## 2023-06-15 DIAGNOSIS — G629 Polyneuropathy, unspecified: Secondary | ICD-10-CM | POA: Diagnosis not present

## 2023-06-15 DIAGNOSIS — E538 Deficiency of other specified B group vitamins: Secondary | ICD-10-CM | POA: Diagnosis not present

## 2023-06-15 DIAGNOSIS — E7800 Pure hypercholesterolemia, unspecified: Secondary | ICD-10-CM | POA: Diagnosis not present

## 2023-06-16 DIAGNOSIS — E538 Deficiency of other specified B group vitamins: Secondary | ICD-10-CM | POA: Diagnosis not present

## 2023-06-16 DIAGNOSIS — E7800 Pure hypercholesterolemia, unspecified: Secondary | ICD-10-CM | POA: Diagnosis not present

## 2023-06-16 DIAGNOSIS — F32A Depression, unspecified: Secondary | ICD-10-CM | POA: Diagnosis not present

## 2023-06-16 DIAGNOSIS — G629 Polyneuropathy, unspecified: Secondary | ICD-10-CM | POA: Diagnosis not present

## 2023-06-16 DIAGNOSIS — G20A1 Parkinson's disease without dyskinesia, without mention of fluctuations: Secondary | ICD-10-CM | POA: Diagnosis not present

## 2023-06-16 DIAGNOSIS — I951 Orthostatic hypotension: Secondary | ICD-10-CM | POA: Diagnosis not present

## 2023-06-17 ENCOUNTER — Other Ambulatory Visit: Payer: Medicare Other

## 2023-06-17 DIAGNOSIS — R410 Disorientation, unspecified: Secondary | ICD-10-CM

## 2023-06-17 DIAGNOSIS — E7800 Pure hypercholesterolemia, unspecified: Secondary | ICD-10-CM | POA: Diagnosis not present

## 2023-06-17 DIAGNOSIS — I951 Orthostatic hypotension: Secondary | ICD-10-CM | POA: Diagnosis not present

## 2023-06-17 DIAGNOSIS — G20A1 Parkinson's disease without dyskinesia, without mention of fluctuations: Secondary | ICD-10-CM | POA: Diagnosis not present

## 2023-06-17 DIAGNOSIS — E538 Deficiency of other specified B group vitamins: Secondary | ICD-10-CM | POA: Diagnosis not present

## 2023-06-17 DIAGNOSIS — G629 Polyneuropathy, unspecified: Secondary | ICD-10-CM | POA: Diagnosis not present

## 2023-06-17 DIAGNOSIS — F32A Depression, unspecified: Secondary | ICD-10-CM | POA: Diagnosis not present

## 2023-06-18 ENCOUNTER — Encounter: Payer: Self-pay | Admitting: Family Medicine

## 2023-06-18 LAB — URINE CULTURE
MICRO NUMBER:: 15572584
SPECIMEN QUALITY:: ADEQUATE

## 2023-06-19 DIAGNOSIS — I951 Orthostatic hypotension: Secondary | ICD-10-CM | POA: Diagnosis not present

## 2023-06-19 DIAGNOSIS — G20A1 Parkinson's disease without dyskinesia, without mention of fluctuations: Secondary | ICD-10-CM | POA: Diagnosis not present

## 2023-06-23 DIAGNOSIS — E7800 Pure hypercholesterolemia, unspecified: Secondary | ICD-10-CM | POA: Diagnosis not present

## 2023-06-23 DIAGNOSIS — I951 Orthostatic hypotension: Secondary | ICD-10-CM | POA: Diagnosis not present

## 2023-06-23 DIAGNOSIS — G20A1 Parkinson's disease without dyskinesia, without mention of fluctuations: Secondary | ICD-10-CM | POA: Diagnosis not present

## 2023-06-23 DIAGNOSIS — F32A Depression, unspecified: Secondary | ICD-10-CM | POA: Diagnosis not present

## 2023-06-23 DIAGNOSIS — E538 Deficiency of other specified B group vitamins: Secondary | ICD-10-CM | POA: Diagnosis not present

## 2023-06-23 DIAGNOSIS — G629 Polyneuropathy, unspecified: Secondary | ICD-10-CM | POA: Diagnosis not present

## 2023-06-24 DIAGNOSIS — E7800 Pure hypercholesterolemia, unspecified: Secondary | ICD-10-CM | POA: Diagnosis not present

## 2023-06-24 DIAGNOSIS — G20A1 Parkinson's disease without dyskinesia, without mention of fluctuations: Secondary | ICD-10-CM | POA: Diagnosis not present

## 2023-06-24 DIAGNOSIS — F32A Depression, unspecified: Secondary | ICD-10-CM | POA: Diagnosis not present

## 2023-06-24 DIAGNOSIS — G629 Polyneuropathy, unspecified: Secondary | ICD-10-CM | POA: Diagnosis not present

## 2023-06-24 DIAGNOSIS — E538 Deficiency of other specified B group vitamins: Secondary | ICD-10-CM | POA: Diagnosis not present

## 2023-06-24 DIAGNOSIS — I951 Orthostatic hypotension: Secondary | ICD-10-CM | POA: Diagnosis not present

## 2023-06-25 DIAGNOSIS — G629 Polyneuropathy, unspecified: Secondary | ICD-10-CM | POA: Diagnosis not present

## 2023-06-25 DIAGNOSIS — E538 Deficiency of other specified B group vitamins: Secondary | ICD-10-CM | POA: Diagnosis not present

## 2023-06-25 DIAGNOSIS — G20A1 Parkinson's disease without dyskinesia, without mention of fluctuations: Secondary | ICD-10-CM | POA: Diagnosis not present

## 2023-06-25 DIAGNOSIS — I951 Orthostatic hypotension: Secondary | ICD-10-CM | POA: Diagnosis not present

## 2023-06-25 DIAGNOSIS — F32A Depression, unspecified: Secondary | ICD-10-CM | POA: Diagnosis not present

## 2023-06-25 DIAGNOSIS — E7800 Pure hypercholesterolemia, unspecified: Secondary | ICD-10-CM | POA: Diagnosis not present

## 2023-06-30 DIAGNOSIS — F32A Depression, unspecified: Secondary | ICD-10-CM | POA: Diagnosis not present

## 2023-06-30 DIAGNOSIS — E7800 Pure hypercholesterolemia, unspecified: Secondary | ICD-10-CM | POA: Diagnosis not present

## 2023-06-30 DIAGNOSIS — E538 Deficiency of other specified B group vitamins: Secondary | ICD-10-CM | POA: Diagnosis not present

## 2023-06-30 DIAGNOSIS — I951 Orthostatic hypotension: Secondary | ICD-10-CM | POA: Diagnosis not present

## 2023-06-30 DIAGNOSIS — G629 Polyneuropathy, unspecified: Secondary | ICD-10-CM | POA: Diagnosis not present

## 2023-06-30 DIAGNOSIS — G20A1 Parkinson's disease without dyskinesia, without mention of fluctuations: Secondary | ICD-10-CM | POA: Diagnosis not present

## 2023-07-01 ENCOUNTER — Telehealth: Payer: Self-pay | Admitting: Family Medicine

## 2023-07-01 DIAGNOSIS — I951 Orthostatic hypotension: Secondary | ICD-10-CM | POA: Diagnosis not present

## 2023-07-01 DIAGNOSIS — E7800 Pure hypercholesterolemia, unspecified: Secondary | ICD-10-CM | POA: Diagnosis not present

## 2023-07-01 DIAGNOSIS — F32A Depression, unspecified: Secondary | ICD-10-CM | POA: Diagnosis not present

## 2023-07-01 DIAGNOSIS — E538 Deficiency of other specified B group vitamins: Secondary | ICD-10-CM | POA: Diagnosis not present

## 2023-07-01 DIAGNOSIS — G629 Polyneuropathy, unspecified: Secondary | ICD-10-CM | POA: Diagnosis not present

## 2023-07-01 DIAGNOSIS — G20A1 Parkinson's disease without dyskinesia, without mention of fluctuations: Secondary | ICD-10-CM | POA: Diagnosis not present

## 2023-07-01 NOTE — Telephone Encounter (Signed)
Received call from Alyson Locket, PT with [facility], to request an extension of verbal orders for PT as follows:  Twice a week for 4 weeks Once a week for 4 weeks.   Please advise at [phone]. Ok to leave message on this secured voicemail.

## 2023-07-02 DIAGNOSIS — M5030 Other cervical disc degeneration, unspecified cervical region: Secondary | ICD-10-CM | POA: Diagnosis not present

## 2023-07-02 DIAGNOSIS — F39 Unspecified mood [affective] disorder: Secondary | ICD-10-CM | POA: Diagnosis not present

## 2023-07-02 DIAGNOSIS — G629 Polyneuropathy, unspecified: Secondary | ICD-10-CM | POA: Diagnosis not present

## 2023-07-02 DIAGNOSIS — Z7952 Long term (current) use of systemic steroids: Secondary | ICD-10-CM | POA: Diagnosis not present

## 2023-07-02 DIAGNOSIS — Z9049 Acquired absence of other specified parts of digestive tract: Secondary | ICD-10-CM | POA: Diagnosis not present

## 2023-07-02 DIAGNOSIS — M6281 Muscle weakness (generalized): Secondary | ICD-10-CM | POA: Diagnosis not present

## 2023-07-02 DIAGNOSIS — F32A Depression, unspecified: Secondary | ICD-10-CM | POA: Diagnosis not present

## 2023-07-02 DIAGNOSIS — I951 Orthostatic hypotension: Secondary | ICD-10-CM | POA: Diagnosis not present

## 2023-07-02 DIAGNOSIS — Z556 Problems related to health literacy: Secondary | ICD-10-CM | POA: Diagnosis not present

## 2023-07-02 DIAGNOSIS — G20A1 Parkinson's disease without dyskinesia, without mention of fluctuations: Secondary | ICD-10-CM | POA: Diagnosis not present

## 2023-07-02 DIAGNOSIS — Z8673 Personal history of transient ischemic attack (TIA), and cerebral infarction without residual deficits: Secondary | ICD-10-CM | POA: Diagnosis not present

## 2023-07-02 DIAGNOSIS — E538 Deficiency of other specified B group vitamins: Secondary | ICD-10-CM | POA: Diagnosis not present

## 2023-07-02 DIAGNOSIS — E7800 Pure hypercholesterolemia, unspecified: Secondary | ICD-10-CM | POA: Diagnosis not present

## 2023-07-07 DIAGNOSIS — E7800 Pure hypercholesterolemia, unspecified: Secondary | ICD-10-CM | POA: Diagnosis not present

## 2023-07-07 DIAGNOSIS — F39 Unspecified mood [affective] disorder: Secondary | ICD-10-CM | POA: Diagnosis not present

## 2023-07-07 DIAGNOSIS — G20A1 Parkinson's disease without dyskinesia, without mention of fluctuations: Secondary | ICD-10-CM | POA: Diagnosis not present

## 2023-07-07 DIAGNOSIS — E538 Deficiency of other specified B group vitamins: Secondary | ICD-10-CM | POA: Diagnosis not present

## 2023-07-07 DIAGNOSIS — I951 Orthostatic hypotension: Secondary | ICD-10-CM | POA: Diagnosis not present

## 2023-07-07 DIAGNOSIS — F32A Depression, unspecified: Secondary | ICD-10-CM | POA: Diagnosis not present

## 2023-07-08 DIAGNOSIS — G20A1 Parkinson's disease without dyskinesia, without mention of fluctuations: Secondary | ICD-10-CM | POA: Diagnosis not present

## 2023-07-08 DIAGNOSIS — F32A Depression, unspecified: Secondary | ICD-10-CM | POA: Diagnosis not present

## 2023-07-08 DIAGNOSIS — E538 Deficiency of other specified B group vitamins: Secondary | ICD-10-CM | POA: Diagnosis not present

## 2023-07-08 DIAGNOSIS — I951 Orthostatic hypotension: Secondary | ICD-10-CM | POA: Diagnosis not present

## 2023-07-08 DIAGNOSIS — E7800 Pure hypercholesterolemia, unspecified: Secondary | ICD-10-CM | POA: Diagnosis not present

## 2023-07-08 DIAGNOSIS — F39 Unspecified mood [affective] disorder: Secondary | ICD-10-CM | POA: Diagnosis not present

## 2023-07-09 DIAGNOSIS — E538 Deficiency of other specified B group vitamins: Secondary | ICD-10-CM | POA: Diagnosis not present

## 2023-07-09 DIAGNOSIS — F32A Depression, unspecified: Secondary | ICD-10-CM | POA: Diagnosis not present

## 2023-07-09 DIAGNOSIS — I951 Orthostatic hypotension: Secondary | ICD-10-CM | POA: Diagnosis not present

## 2023-07-09 DIAGNOSIS — G20A1 Parkinson's disease without dyskinesia, without mention of fluctuations: Secondary | ICD-10-CM | POA: Diagnosis not present

## 2023-07-09 DIAGNOSIS — F39 Unspecified mood [affective] disorder: Secondary | ICD-10-CM | POA: Diagnosis not present

## 2023-07-09 DIAGNOSIS — E7800 Pure hypercholesterolemia, unspecified: Secondary | ICD-10-CM | POA: Diagnosis not present

## 2023-07-10 DIAGNOSIS — I951 Orthostatic hypotension: Secondary | ICD-10-CM | POA: Diagnosis not present

## 2023-07-10 DIAGNOSIS — F32A Depression, unspecified: Secondary | ICD-10-CM | POA: Diagnosis not present

## 2023-07-10 DIAGNOSIS — E538 Deficiency of other specified B group vitamins: Secondary | ICD-10-CM | POA: Diagnosis not present

## 2023-07-10 DIAGNOSIS — E7800 Pure hypercholesterolemia, unspecified: Secondary | ICD-10-CM | POA: Diagnosis not present

## 2023-07-10 DIAGNOSIS — F39 Unspecified mood [affective] disorder: Secondary | ICD-10-CM | POA: Diagnosis not present

## 2023-07-10 DIAGNOSIS — G20A1 Parkinson's disease without dyskinesia, without mention of fluctuations: Secondary | ICD-10-CM | POA: Diagnosis not present

## 2023-07-13 DIAGNOSIS — G20A1 Parkinson's disease without dyskinesia, without mention of fluctuations: Secondary | ICD-10-CM | POA: Diagnosis not present

## 2023-07-13 DIAGNOSIS — E7800 Pure hypercholesterolemia, unspecified: Secondary | ICD-10-CM | POA: Diagnosis not present

## 2023-07-13 DIAGNOSIS — E538 Deficiency of other specified B group vitamins: Secondary | ICD-10-CM | POA: Diagnosis not present

## 2023-07-13 DIAGNOSIS — I951 Orthostatic hypotension: Secondary | ICD-10-CM | POA: Diagnosis not present

## 2023-07-13 DIAGNOSIS — F39 Unspecified mood [affective] disorder: Secondary | ICD-10-CM | POA: Diagnosis not present

## 2023-07-13 DIAGNOSIS — F32A Depression, unspecified: Secondary | ICD-10-CM | POA: Diagnosis not present

## 2023-07-15 DIAGNOSIS — F32A Depression, unspecified: Secondary | ICD-10-CM | POA: Diagnosis not present

## 2023-07-15 DIAGNOSIS — I951 Orthostatic hypotension: Secondary | ICD-10-CM | POA: Diagnosis not present

## 2023-07-15 DIAGNOSIS — E538 Deficiency of other specified B group vitamins: Secondary | ICD-10-CM | POA: Diagnosis not present

## 2023-07-15 DIAGNOSIS — E7800 Pure hypercholesterolemia, unspecified: Secondary | ICD-10-CM | POA: Diagnosis not present

## 2023-07-15 DIAGNOSIS — F39 Unspecified mood [affective] disorder: Secondary | ICD-10-CM | POA: Diagnosis not present

## 2023-07-15 DIAGNOSIS — G20A1 Parkinson's disease without dyskinesia, without mention of fluctuations: Secondary | ICD-10-CM | POA: Diagnosis not present

## 2023-07-16 DIAGNOSIS — E7800 Pure hypercholesterolemia, unspecified: Secondary | ICD-10-CM | POA: Diagnosis not present

## 2023-07-16 DIAGNOSIS — F39 Unspecified mood [affective] disorder: Secondary | ICD-10-CM | POA: Diagnosis not present

## 2023-07-16 DIAGNOSIS — E538 Deficiency of other specified B group vitamins: Secondary | ICD-10-CM | POA: Diagnosis not present

## 2023-07-16 DIAGNOSIS — G20A1 Parkinson's disease without dyskinesia, without mention of fluctuations: Secondary | ICD-10-CM | POA: Diagnosis not present

## 2023-07-16 DIAGNOSIS — F32A Depression, unspecified: Secondary | ICD-10-CM | POA: Diagnosis not present

## 2023-07-16 DIAGNOSIS — I951 Orthostatic hypotension: Secondary | ICD-10-CM | POA: Diagnosis not present

## 2023-07-18 DIAGNOSIS — G20A1 Parkinson's disease without dyskinesia, without mention of fluctuations: Secondary | ICD-10-CM | POA: Diagnosis not present

## 2023-07-18 DIAGNOSIS — F32A Depression, unspecified: Secondary | ICD-10-CM | POA: Diagnosis not present

## 2023-07-18 DIAGNOSIS — E7800 Pure hypercholesterolemia, unspecified: Secondary | ICD-10-CM | POA: Diagnosis not present

## 2023-07-18 DIAGNOSIS — I951 Orthostatic hypotension: Secondary | ICD-10-CM | POA: Diagnosis not present

## 2023-07-18 DIAGNOSIS — E538 Deficiency of other specified B group vitamins: Secondary | ICD-10-CM | POA: Diagnosis not present

## 2023-07-18 DIAGNOSIS — F39 Unspecified mood [affective] disorder: Secondary | ICD-10-CM | POA: Diagnosis not present

## 2023-07-21 DIAGNOSIS — E538 Deficiency of other specified B group vitamins: Secondary | ICD-10-CM | POA: Diagnosis not present

## 2023-07-21 DIAGNOSIS — G20A1 Parkinson's disease without dyskinesia, without mention of fluctuations: Secondary | ICD-10-CM | POA: Diagnosis not present

## 2023-07-21 DIAGNOSIS — E7800 Pure hypercholesterolemia, unspecified: Secondary | ICD-10-CM | POA: Diagnosis not present

## 2023-07-21 DIAGNOSIS — F32A Depression, unspecified: Secondary | ICD-10-CM | POA: Diagnosis not present

## 2023-07-21 DIAGNOSIS — F39 Unspecified mood [affective] disorder: Secondary | ICD-10-CM | POA: Diagnosis not present

## 2023-07-21 DIAGNOSIS — I951 Orthostatic hypotension: Secondary | ICD-10-CM | POA: Diagnosis not present

## 2023-07-22 DIAGNOSIS — F39 Unspecified mood [affective] disorder: Secondary | ICD-10-CM | POA: Diagnosis not present

## 2023-07-22 DIAGNOSIS — I951 Orthostatic hypotension: Secondary | ICD-10-CM | POA: Diagnosis not present

## 2023-07-22 DIAGNOSIS — E7800 Pure hypercholesterolemia, unspecified: Secondary | ICD-10-CM | POA: Diagnosis not present

## 2023-07-22 DIAGNOSIS — E538 Deficiency of other specified B group vitamins: Secondary | ICD-10-CM | POA: Diagnosis not present

## 2023-07-22 DIAGNOSIS — F32A Depression, unspecified: Secondary | ICD-10-CM | POA: Diagnosis not present

## 2023-07-22 DIAGNOSIS — G20A1 Parkinson's disease without dyskinesia, without mention of fluctuations: Secondary | ICD-10-CM | POA: Diagnosis not present

## 2023-07-24 DIAGNOSIS — I951 Orthostatic hypotension: Secondary | ICD-10-CM | POA: Diagnosis not present

## 2023-07-24 DIAGNOSIS — F32A Depression, unspecified: Secondary | ICD-10-CM | POA: Diagnosis not present

## 2023-07-24 DIAGNOSIS — F39 Unspecified mood [affective] disorder: Secondary | ICD-10-CM | POA: Diagnosis not present

## 2023-07-24 DIAGNOSIS — E538 Deficiency of other specified B group vitamins: Secondary | ICD-10-CM | POA: Diagnosis not present

## 2023-07-24 DIAGNOSIS — E7800 Pure hypercholesterolemia, unspecified: Secondary | ICD-10-CM | POA: Diagnosis not present

## 2023-07-24 DIAGNOSIS — G20A1 Parkinson's disease without dyskinesia, without mention of fluctuations: Secondary | ICD-10-CM | POA: Diagnosis not present

## 2023-07-28 DIAGNOSIS — I951 Orthostatic hypotension: Secondary | ICD-10-CM | POA: Diagnosis not present

## 2023-07-28 DIAGNOSIS — F32A Depression, unspecified: Secondary | ICD-10-CM | POA: Diagnosis not present

## 2023-07-28 DIAGNOSIS — E538 Deficiency of other specified B group vitamins: Secondary | ICD-10-CM | POA: Diagnosis not present

## 2023-07-28 DIAGNOSIS — G20A1 Parkinson's disease without dyskinesia, without mention of fluctuations: Secondary | ICD-10-CM | POA: Diagnosis not present

## 2023-07-28 DIAGNOSIS — F39 Unspecified mood [affective] disorder: Secondary | ICD-10-CM | POA: Diagnosis not present

## 2023-07-28 DIAGNOSIS — E7800 Pure hypercholesterolemia, unspecified: Secondary | ICD-10-CM | POA: Diagnosis not present

## 2023-07-29 DIAGNOSIS — F32A Depression, unspecified: Secondary | ICD-10-CM | POA: Diagnosis not present

## 2023-07-29 DIAGNOSIS — E538 Deficiency of other specified B group vitamins: Secondary | ICD-10-CM | POA: Diagnosis not present

## 2023-07-29 DIAGNOSIS — E7800 Pure hypercholesterolemia, unspecified: Secondary | ICD-10-CM | POA: Diagnosis not present

## 2023-07-29 DIAGNOSIS — I951 Orthostatic hypotension: Secondary | ICD-10-CM | POA: Diagnosis not present

## 2023-07-29 DIAGNOSIS — F39 Unspecified mood [affective] disorder: Secondary | ICD-10-CM | POA: Diagnosis not present

## 2023-07-29 DIAGNOSIS — G20A1 Parkinson's disease without dyskinesia, without mention of fluctuations: Secondary | ICD-10-CM | POA: Diagnosis not present

## 2023-07-31 DIAGNOSIS — F39 Unspecified mood [affective] disorder: Secondary | ICD-10-CM | POA: Diagnosis not present

## 2023-07-31 DIAGNOSIS — E7800 Pure hypercholesterolemia, unspecified: Secondary | ICD-10-CM | POA: Diagnosis not present

## 2023-07-31 DIAGNOSIS — I951 Orthostatic hypotension: Secondary | ICD-10-CM | POA: Diagnosis not present

## 2023-07-31 DIAGNOSIS — G20A1 Parkinson's disease without dyskinesia, without mention of fluctuations: Secondary | ICD-10-CM | POA: Diagnosis not present

## 2023-07-31 DIAGNOSIS — F32A Depression, unspecified: Secondary | ICD-10-CM | POA: Diagnosis not present

## 2023-07-31 DIAGNOSIS — E538 Deficiency of other specified B group vitamins: Secondary | ICD-10-CM | POA: Diagnosis not present

## 2023-08-01 DIAGNOSIS — G20A1 Parkinson's disease without dyskinesia, without mention of fluctuations: Secondary | ICD-10-CM | POA: Diagnosis not present

## 2023-08-01 DIAGNOSIS — M5030 Other cervical disc degeneration, unspecified cervical region: Secondary | ICD-10-CM | POA: Diagnosis not present

## 2023-08-01 DIAGNOSIS — I951 Orthostatic hypotension: Secondary | ICD-10-CM | POA: Diagnosis not present

## 2023-08-01 DIAGNOSIS — G629 Polyneuropathy, unspecified: Secondary | ICD-10-CM | POA: Diagnosis not present

## 2023-08-01 DIAGNOSIS — E538 Deficiency of other specified B group vitamins: Secondary | ICD-10-CM | POA: Diagnosis not present

## 2023-08-01 DIAGNOSIS — F39 Unspecified mood [affective] disorder: Secondary | ICD-10-CM | POA: Diagnosis not present

## 2023-08-01 DIAGNOSIS — Z9049 Acquired absence of other specified parts of digestive tract: Secondary | ICD-10-CM | POA: Diagnosis not present

## 2023-08-01 DIAGNOSIS — E7800 Pure hypercholesterolemia, unspecified: Secondary | ICD-10-CM | POA: Diagnosis not present

## 2023-08-01 DIAGNOSIS — M6281 Muscle weakness (generalized): Secondary | ICD-10-CM | POA: Diagnosis not present

## 2023-08-01 DIAGNOSIS — Z8673 Personal history of transient ischemic attack (TIA), and cerebral infarction without residual deficits: Secondary | ICD-10-CM | POA: Diagnosis not present

## 2023-08-01 DIAGNOSIS — Z7952 Long term (current) use of systemic steroids: Secondary | ICD-10-CM | POA: Diagnosis not present

## 2023-08-01 DIAGNOSIS — F32A Depression, unspecified: Secondary | ICD-10-CM | POA: Diagnosis not present

## 2023-08-01 DIAGNOSIS — Z556 Problems related to health literacy: Secondary | ICD-10-CM | POA: Diagnosis not present

## 2023-08-04 DIAGNOSIS — I951 Orthostatic hypotension: Secondary | ICD-10-CM | POA: Diagnosis not present

## 2023-08-04 DIAGNOSIS — E7800 Pure hypercholesterolemia, unspecified: Secondary | ICD-10-CM | POA: Diagnosis not present

## 2023-08-04 DIAGNOSIS — F32A Depression, unspecified: Secondary | ICD-10-CM | POA: Diagnosis not present

## 2023-08-04 DIAGNOSIS — E538 Deficiency of other specified B group vitamins: Secondary | ICD-10-CM | POA: Diagnosis not present

## 2023-08-04 DIAGNOSIS — G20A1 Parkinson's disease without dyskinesia, without mention of fluctuations: Secondary | ICD-10-CM | POA: Diagnosis not present

## 2023-08-04 DIAGNOSIS — F39 Unspecified mood [affective] disorder: Secondary | ICD-10-CM | POA: Diagnosis not present

## 2023-08-05 DIAGNOSIS — I951 Orthostatic hypotension: Secondary | ICD-10-CM | POA: Diagnosis not present

## 2023-08-05 DIAGNOSIS — E538 Deficiency of other specified B group vitamins: Secondary | ICD-10-CM | POA: Diagnosis not present

## 2023-08-05 DIAGNOSIS — F32A Depression, unspecified: Secondary | ICD-10-CM | POA: Diagnosis not present

## 2023-08-05 DIAGNOSIS — G20A1 Parkinson's disease without dyskinesia, without mention of fluctuations: Secondary | ICD-10-CM | POA: Diagnosis not present

## 2023-08-05 DIAGNOSIS — E7800 Pure hypercholesterolemia, unspecified: Secondary | ICD-10-CM | POA: Diagnosis not present

## 2023-08-05 DIAGNOSIS — F39 Unspecified mood [affective] disorder: Secondary | ICD-10-CM | POA: Diagnosis not present

## 2023-08-07 DIAGNOSIS — F39 Unspecified mood [affective] disorder: Secondary | ICD-10-CM | POA: Diagnosis not present

## 2023-08-07 DIAGNOSIS — F32A Depression, unspecified: Secondary | ICD-10-CM | POA: Diagnosis not present

## 2023-08-07 DIAGNOSIS — I951 Orthostatic hypotension: Secondary | ICD-10-CM | POA: Diagnosis not present

## 2023-08-07 DIAGNOSIS — G20A1 Parkinson's disease without dyskinesia, without mention of fluctuations: Secondary | ICD-10-CM | POA: Diagnosis not present

## 2023-08-07 DIAGNOSIS — E7800 Pure hypercholesterolemia, unspecified: Secondary | ICD-10-CM | POA: Diagnosis not present

## 2023-08-07 DIAGNOSIS — E538 Deficiency of other specified B group vitamins: Secondary | ICD-10-CM | POA: Diagnosis not present

## 2023-08-11 DIAGNOSIS — I951 Orthostatic hypotension: Secondary | ICD-10-CM | POA: Diagnosis not present

## 2023-08-11 DIAGNOSIS — E7800 Pure hypercholesterolemia, unspecified: Secondary | ICD-10-CM | POA: Diagnosis not present

## 2023-08-11 DIAGNOSIS — G20A1 Parkinson's disease without dyskinesia, without mention of fluctuations: Secondary | ICD-10-CM | POA: Diagnosis not present

## 2023-08-11 DIAGNOSIS — F39 Unspecified mood [affective] disorder: Secondary | ICD-10-CM | POA: Diagnosis not present

## 2023-08-11 DIAGNOSIS — E538 Deficiency of other specified B group vitamins: Secondary | ICD-10-CM | POA: Diagnosis not present

## 2023-08-11 DIAGNOSIS — F32A Depression, unspecified: Secondary | ICD-10-CM | POA: Diagnosis not present

## 2023-08-13 DIAGNOSIS — I951 Orthostatic hypotension: Secondary | ICD-10-CM | POA: Diagnosis not present

## 2023-08-13 DIAGNOSIS — F32A Depression, unspecified: Secondary | ICD-10-CM | POA: Diagnosis not present

## 2023-08-13 DIAGNOSIS — E538 Deficiency of other specified B group vitamins: Secondary | ICD-10-CM | POA: Diagnosis not present

## 2023-08-13 DIAGNOSIS — F39 Unspecified mood [affective] disorder: Secondary | ICD-10-CM | POA: Diagnosis not present

## 2023-08-13 DIAGNOSIS — E7800 Pure hypercholesterolemia, unspecified: Secondary | ICD-10-CM | POA: Diagnosis not present

## 2023-08-13 DIAGNOSIS — G20A1 Parkinson's disease without dyskinesia, without mention of fluctuations: Secondary | ICD-10-CM | POA: Diagnosis not present

## 2023-08-14 DIAGNOSIS — F32A Depression, unspecified: Secondary | ICD-10-CM | POA: Diagnosis not present

## 2023-08-14 DIAGNOSIS — G20A1 Parkinson's disease without dyskinesia, without mention of fluctuations: Secondary | ICD-10-CM | POA: Diagnosis not present

## 2023-08-14 DIAGNOSIS — F39 Unspecified mood [affective] disorder: Secondary | ICD-10-CM | POA: Diagnosis not present

## 2023-08-14 DIAGNOSIS — E7800 Pure hypercholesterolemia, unspecified: Secondary | ICD-10-CM | POA: Diagnosis not present

## 2023-08-14 DIAGNOSIS — I951 Orthostatic hypotension: Secondary | ICD-10-CM | POA: Diagnosis not present

## 2023-08-14 DIAGNOSIS — E538 Deficiency of other specified B group vitamins: Secondary | ICD-10-CM | POA: Diagnosis not present

## 2023-08-17 DIAGNOSIS — F39 Unspecified mood [affective] disorder: Secondary | ICD-10-CM | POA: Diagnosis not present

## 2023-08-17 DIAGNOSIS — G20A1 Parkinson's disease without dyskinesia, without mention of fluctuations: Secondary | ICD-10-CM | POA: Diagnosis not present

## 2023-08-17 DIAGNOSIS — E7800 Pure hypercholesterolemia, unspecified: Secondary | ICD-10-CM | POA: Diagnosis not present

## 2023-08-17 DIAGNOSIS — I951 Orthostatic hypotension: Secondary | ICD-10-CM | POA: Diagnosis not present

## 2023-08-17 DIAGNOSIS — E538 Deficiency of other specified B group vitamins: Secondary | ICD-10-CM | POA: Diagnosis not present

## 2023-08-17 DIAGNOSIS — F32A Depression, unspecified: Secondary | ICD-10-CM | POA: Diagnosis not present

## 2023-08-19 DIAGNOSIS — E538 Deficiency of other specified B group vitamins: Secondary | ICD-10-CM | POA: Diagnosis not present

## 2023-08-19 DIAGNOSIS — G20A1 Parkinson's disease without dyskinesia, without mention of fluctuations: Secondary | ICD-10-CM | POA: Diagnosis not present

## 2023-08-19 DIAGNOSIS — I951 Orthostatic hypotension: Secondary | ICD-10-CM | POA: Diagnosis not present

## 2023-08-19 DIAGNOSIS — E7800 Pure hypercholesterolemia, unspecified: Secondary | ICD-10-CM | POA: Diagnosis not present

## 2023-08-19 DIAGNOSIS — F39 Unspecified mood [affective] disorder: Secondary | ICD-10-CM | POA: Diagnosis not present

## 2023-08-19 DIAGNOSIS — F32A Depression, unspecified: Secondary | ICD-10-CM | POA: Diagnosis not present

## 2023-08-24 ENCOUNTER — Telehealth: Payer: Self-pay

## 2023-08-24 NOTE — Telephone Encounter (Signed)
Verbal orders given. Mjp,lpn  Copied from CRM [id number]. Topic: Clinical - Home Health Verbal Orders >> Aug 24, 2023  9:38 AM Louie Boston wrote: Caller/Agency: [facility] Callback Number: [phone] Service Requested: Physical Therapy / Order For Nurse for Wound Care / Home Health Aid Frequency: Once week for 8 weeks/ Home Health Aid twice a week for 8 weeks Any new concerns about the patient? No - Patient had fall last week

## 2023-08-26 DIAGNOSIS — F32A Depression, unspecified: Secondary | ICD-10-CM | POA: Diagnosis not present

## 2023-08-26 DIAGNOSIS — G20A1 Parkinson's disease without dyskinesia, without mention of fluctuations: Secondary | ICD-10-CM | POA: Diagnosis not present

## 2023-08-26 DIAGNOSIS — F39 Unspecified mood [affective] disorder: Secondary | ICD-10-CM | POA: Diagnosis not present

## 2023-08-26 DIAGNOSIS — E7800 Pure hypercholesterolemia, unspecified: Secondary | ICD-10-CM | POA: Diagnosis not present

## 2023-08-26 DIAGNOSIS — I951 Orthostatic hypotension: Secondary | ICD-10-CM | POA: Diagnosis not present

## 2023-08-26 DIAGNOSIS — E538 Deficiency of other specified B group vitamins: Secondary | ICD-10-CM | POA: Diagnosis not present

## 2023-08-27 ENCOUNTER — Ambulatory Visit: Payer: Medicare Other | Admitting: *Deleted

## 2023-08-27 DIAGNOSIS — G20A1 Parkinson's disease without dyskinesia, without mention of fluctuations: Secondary | ICD-10-CM | POA: Diagnosis not present

## 2023-08-27 DIAGNOSIS — Z0000 Encounter for general adult medical examination without abnormal findings: Secondary | ICD-10-CM | POA: Diagnosis not present

## 2023-08-27 DIAGNOSIS — E7800 Pure hypercholesterolemia, unspecified: Secondary | ICD-10-CM | POA: Diagnosis not present

## 2023-08-27 DIAGNOSIS — E538 Deficiency of other specified B group vitamins: Secondary | ICD-10-CM | POA: Diagnosis not present

## 2023-08-27 DIAGNOSIS — I951 Orthostatic hypotension: Secondary | ICD-10-CM | POA: Diagnosis not present

## 2023-08-27 DIAGNOSIS — F39 Unspecified mood [affective] disorder: Secondary | ICD-10-CM | POA: Diagnosis not present

## 2023-08-27 DIAGNOSIS — F32A Depression, unspecified: Secondary | ICD-10-CM | POA: Diagnosis not present

## 2023-08-27 NOTE — Progress Notes (Signed)
Subjective:   Alex Frazier is a 77 y.o. male who presents for Medicare Annual/Subsequent preventive examination.  Visit Complete: Virtual I connected with  [name] on 08/27/23 by a audio  patients wife [name] assisted in visit enabled telemedicine application and verified that I am speaking with the correct person using two identifiers.  Patient Location: Home  Provider Location: Home Office  I discussed the limitations of evaluation and management by telemedicine. The patient expressed understanding and agreed to proceed.  Vital Signs: Because this visit was a virtual/telehealth visit, some criteria may be missing or patient reported. Any vitals not documented were not able to be obtained and vitals that have been documented are patient reported.   Cardiac Risk Factors include: advanced age (>24men, >22 women);male gender;sedentary lifestyle     Objective:    There were no vitals filed for this visit. There is no height or weight on file to calculate BMI.     08/27/2023    2:37 PM 05/14/2023   10:50 PM 08/19/2022    4:50 PM 05/09/2022    4:46 PM 01/10/2022   10:12 AM 03/08/2021    3:02 PM 03/17/2020    8:00 PM  Advanced Directives  Does Patient Have a Medical Advance Directive? Yes No Yes No Yes Yes Yes  Type of Diplomatic Services operational officer  Living will  Healthcare Power of [place];Living will Healthcare Power of [place];Living will Healthcare Power of [place];Living will  Does patient want to make changes to medical advance directive?   No - Patient declined  No - Guardian declined No - Patient declined No - Guardian declined  Copy of Healthcare Power of Attorney in Chart?      No - copy requested No - copy requested  Would patient like information on creating a medical advance directive?  No - Patient declined  No - Guardian declined       Current Medications (verified) Outpatient Encounter Medications as of 08/27/2023  Medication Sig    acetaminophen (TYLENOL) 500 MG tablet Take 2 tablets (1,000 mg total) by mouth 3 (three) times daily.   Calcium Carb-Cholecalciferol (CALCIUM 600 + D PO) Take 1 tablet by mouth daily.   carbidopa-levodopa (SINEMET IR) 25-100 MG tablet Take 1 tablet by mouth 3 (three) times daily.   cephALEXin (KEFLEX) 500 MG capsule Take 1 capsule (500 mg total) by mouth 3 (three) times daily.   Cyanocobalamin (VITAMIN B 12 PO) Take 1 tablet by mouth in the morning.    fludrocortisone (FLORINEF) 0.1 MG tablet Take 1 tablet (0.1 mg total) by mouth daily.   FLUoxetine (PROZAC) 20 MG capsule Take 1 capsule (20 mg total) by mouth daily.   midodrine (PROAMATINE) 10 MG tablet Take 1 tablet (10 mg total) by mouth 3 (three) times daily as needed (for systolic blood pressure lower than 90).   polyethylene glycol powder (GLYCOLAX/MIRALAX) 17 GM/SCOOP powder Take 17 g by mouth See admin instructions. Mix 17 grams of powder into 4-8 ounces of prune juice and drink once a day   pravastatin (PRAVACHOL) 20 MG tablet Take 1 tablet by mouth daily at 6 PM.   senna-docusate (SENOKOT-S) 8.6-50 MG tablet Take 2 tablets by mouth at bedtime as needed for mild constipation.   No facility-administered encounter medications on file as of 08/27/2023.    Allergies (verified) Tape, Hydrocodone-acetaminophen, Indomethacin, Lyrica [pregabalin], Nsaids, and Quetiapine   History: Past Medical History:  Diagnosis Date   B12 deficiency    Borderline  Bulging discs    Degenerative disc disease, cervical    Gastritis    Hyperlipidemia    Neuropathy    Parkinson's disease (HCC)    Parkinsonian syndrome (HCC)    Peripheral neuropathy    TIA (transient ischemic attack)    TIA symptoms from hypotension   Past Surgical History:  Procedure Laterality Date   APPENDECTOMY     BACK SURGERY     Family History  Problem Relation Age of Onset   Parkinson's disease Mother    Parkinson's disease Father    Diabetes Sister    Heart disease  Brother    Social History   Socioeconomic History   Marital status: Married    Spouse name: Not on file   Number of children: 3   Years of education: Not on file   Highest education level: High school graduate  Occupational History   Not on file  Tobacco Use   Smoking status: Never   Smokeless tobacco: Never  Vaping Use   Vaping status: Never Used  Substance and Sexual Activity   Alcohol use: No   Drug use: No   Sexual activity: Not Currently  Other Topics Concern   Not on file  Social History Narrative   Lives at home with his wife   Right handed   Caffeine: none   Social Drivers of Corporate investment banker Strain: Low Risk  (08/27/2023)   Overall Financial Resource Strain (CARDIA)    Difficulty of Paying Living Expenses: Not hard at all  Food Insecurity: No Food Insecurity (08/27/2023)   Hunger Vital Sign    Worried About Running Out of Food in the Last Year: Never true    Ran Out of Food in the Last Year: Never true  Transportation Needs: No Transportation Needs (08/27/2023)   PRAPARE - Administrator, Civil Service (Medical): No    Lack of Transportation (Non-Medical): No  Physical Activity: Inactive (08/27/2023)   Exercise Vital Sign    Days of Exercise per Week: 0 days    Minutes of Exercise per Session: 0 min  Stress: No Stress Concern Present (08/27/2023)   [employer] of Occupational Health - Occupational Stress Questionnaire    Feeling of Stress : Not at all  Social Connections: Moderately Isolated (08/27/2023)   Social Connection and Isolation Panel [NHANES]    Frequency of Communication with Friends and Family: Twice a week    Frequency of Social Gatherings with Friends and Family: Twice a week    Attends Religious Services: Never    Diplomatic Services operational officer: No    Attends Engineer, structural: Never    Marital Status: Married    Tobacco Counseling Counseling given: Not Answered   Clinical  Intake:  Pre-visit preparation completed: Yes  Pain : No/denies pain     Diabetes: No  How often do you need to have someone help you when you read instructions, pamphlets, or other written materials from your doctor or pharmacy?: 5 - Always  Interpreter Needed?: No  Information entered by :: Remi Haggard LPN   Activities of Daily Living    08/27/2023    2:51 PM 05/14/2023   10:50 PM  In your present state of health, do you have any difficulty performing the following activities:  Hearing? 1 1  Vision? 0 0  Difficulty concentrating or making decisions? 1 1  Walking or climbing stairs? 1 1  Dressing or bathing? 1 1  Doing errands,  shopping? 1 1  Preparing Food and eating ? Y   Using the Toilet? Y   In the past six months, have you accidently leaked urine? Y   Do you have problems with loss of bowel control? Y   Managing your Medications? Y   Managing your Finances? Y   Housekeeping or managing your Housekeeping? Y     Patient Care Team: Donita Brooks, MD as PCP - General (Family Medicine) Nahser, Deloris Ping, MD as PCP - Cardiology (Cardiology) Clinton Gallant, RN as [facility] Care Management Thenganatt, Chales Abrahams, MD as Referring Physician (Psychiatry)  Indicate any recent Medical Services you may have received from other than [facility] providers in the past year (date may be approximate).     Assessment:   This is a routine wellness examination for [place].  Hearing/Vision screen Hearing Screening - Comments:: Yes trouble hearing No hearing aids Vision Screening - Comments:: Up to date [facility]   Goals Addressed             This Visit's Progress    Patient Stated       No goals       Depression Screen    08/27/2023    2:40 PM 08/19/2022    4:53 PM 05/09/2022   11:23 AM 01/10/2022   10:06 AM 03/08/2021    3:04 PM 09/23/2018    8:51 AM 08/25/2017    9:58 AM  PHQ 2/9 Scores  PHQ - 2 Score 2 0 1  0 0 0  PHQ- 9 Score 2        Exception  Documentation    Other- indicate reason in comment box     Not completed    unable to assess       Fall Risk    08/27/2023    2:36 PM 05/21/2023    2:48 PM 12/27/2022    3:37 PM 11/18/2022   10:03 AM 08/19/2022    4:47 PM  Fall Risk   Falls in the past year? 1 1 1 1 1   Number falls in past yr: 1 1 1 1 1   Injury with Fall? 1 1 1 1 1   Risk for fall due to : Impaired balance/gait;History of fall(s);Impaired mobility History of fall(s);Impaired balance/gait;Impaired mobility History of fall(s);Impaired balance/gait;Impaired mobility;Medication side effect History of fall(s);Impaired balance/gait;Impaired mobility;Mental status change History of fall(s);Impaired balance/gait;Impaired mobility  Follow up Falls evaluation completed;Education provided;Falls prevention discussed Falls evaluation completed;Falls prevention discussed Falls evaluation completed;Education provided Falls evaluation completed;Falls prevention discussed Falls evaluation completed;Education provided;Falls prevention discussed    MEDICARE RISK AT HOME: Medicare Risk at Home Any stairs in or around the home?: Yes If so, are there any without handrails?: No Home free of loose throw rugs in walkways, pet beds, electrical cords, etc?: Yes Adequate lighting in your home to reduce risk of falls?: Yes Life alert?: No Use of a cane, walker or w/c?: Yes Grab bars in the bathroom?: Yes Shower chair or bench in shower?: Yes Elevated toilet seat or a handicapped toilet?: Yes  TIMED UP AND GO:  Was the test performed?  No    Cognitive Function:        08/27/2023    2:41 PM 08/19/2022    4:52 PM  6CIT Screen  What Year? 0 points 4 points  What month? 0 points 0 points  What time? 3 points 0 points  Count back from 20 4 points 0 points  Months in reverse 4 points 4 points  Repeat phrase 8 points 6 points  Total Score 19 points 14 points    Immunizations Immunization History  Administered Date(s) Administered    Fluad Quad(high Dose 65+) 05/31/2019, 05/30/2021, 06/06/2022   Fluad Trivalent(High Dose 65+) 06/09/2023   Influenza Split 06/02/2012   Influenza Whole 07/09/2009   Influenza, High Dose Seasonal PF 06/25/2017, 06/09/2018, 06/06/2022   Influenza,inj,Quad PF,6+ Mos 06/16/2013, 06/29/2014, 06/28/2015, 06/26/2016   PFIZER(Purple Top)SARS-COV-2 Vaccination 10/15/2019, 11/08/2019   Pneumococcal Conjugate-13 10/18/2013   Pneumococcal Polysaccharide-23 07/15/2011   Td 09/09/2003, 04/29/2011   Zoster, Live 01/21/2011    TDAP status: Due, Education has been provided regarding the importance of this vaccine. Advised may receive this vaccine at local pharmacy or Health Dept. Aware to provide a copy of the vaccination record if obtained from local pharmacy or Health Dept. Verbalized acceptance and understanding.  Flu Vaccine status: Up to date  Pneumococcal vaccine status: Up to date  Covid-19 vaccine status: Declined, Education has been provided regarding the importance of this vaccine but patient still declined. Advised may receive this vaccine at local pharmacy or Health Dept.or vaccine clinic. Aware to provide a copy of the vaccination record if obtained from local pharmacy or Health Dept. Verbalized acceptance and understanding.  Qualifies for Shingles Vaccine? Yes   Zostavax completed No   Shingrix Completed?: No.    Education has been provided regarding the importance of this vaccine. Patient has been advised to call insurance company to determine out of pocket expense if they have not yet received this vaccine. Advised may also receive vaccine at local pharmacy or Health Dept. Verbalized acceptance and understanding.  Screening Tests Health Maintenance  Topic Date Due   Zoster Vaccines- Shingrix (1 of 2) 09/09/2023 (Originally 06/14/1965)   COVID-19 Vaccine (3 - Pfizer risk series) 09/12/2023 (Originally 12/06/2019)   Medicare Annual Wellness (AWV)  08/26/2024   Pneumonia Vaccine 65+ Years  old  Completed   INFLUENZA VACCINE  Completed   Hepatitis C Screening  Completed   HPV VACCINES  Aged Out   DTaP/Tdap/Td  Discontinued   Colonoscopy  Discontinued    Health Maintenance  There are no preventive care reminders to display for this patient.   Colorectal cancer screening: No longer required.   Lung Cancer Screening: (Low Dose CT Chest recommended if Age 81-80 years, 20 pack-year currently smoking OR have quit w/in 15years.) does not qualify.   Lung Cancer Screening Referral:   Additional Screening:  Hepatitis C Screening: does not qualify; Completed 2018  Vision Screening: Recommended annual ophthalmology exams for early detection of glaucoma and other disorders of the eye. Is the patient up to date with their annual eye exam?  Yes  Who is the provider or what is the name of the office in which the patient attends annual eye exams? [facility] If pt is not established with a provider, would they like to be referred to a provider to establish care? No .   Dental Screening: Recommended annual dental exams for proper oral hygiene    Community Resource Referral / Chronic Care Management: CRR required this visit?  No   CCM required this visit?  No     Plan:     I have personally reviewed and noted the following in the patient's chart:   Medical and social history Use of alcohol, tobacco or illicit drugs  Current medications and supplements including opioid prescriptions. Patient is not currently taking opioid prescriptions. Functional ability and status Nutritional status Physical activity Advanced directives List of other  physicians Hospitalizations, surgeries, and ER visits in previous 12 months Vitals Screenings to include cognitive, depression, and falls Referrals and appointments  In addition, I have reviewed and discussed with patient certain preventive protocols, quality metrics, and best practice recommendations. A written personalized care plan for  preventive services as well as general preventive health recommendations were provided to patient.     Remi Haggard, LPN   [id number]   After Visit Summary: (MyChart) Due to this being a telephonic visit, the after visit summary with patients personalized plan was offered to patient via MyChart   Nurse Notes:

## 2023-08-27 NOTE — Patient Instructions (Signed)
Alex Frazier , Thank you for taking time to come for your Medicare Wellness Visit. I appreciate your ongoing commitment to your health goals. Please review the following plan we discussed and let me know if I can assist you in the future.   Screening recommendations/referrals: Colonoscopy: no longer needed Recommended yearly ophthalmology/optometry visit for glaucoma screening and checkup Recommended yearly dental visit for hygiene and checkup  Vaccinations: Influenza vaccine: up to date Pneumococcal vaccine: up to date Tdap vaccine: Education provided Shingles vaccine:     Advanced directives: yes    Preventive Care 65 Years and Older, Male Preventive care refers to lifestyle choices and visits with your health care provider that can promote health and wellness. What does preventive care include? A yearly physical exam. This is also called an annual well check. Dental exams once or twice a year. Routine eye exams. Ask your health care provider how often you should have your eyes checked. Personal lifestyle choices, including: Daily care of your teeth and gums. Regular physical activity. Eating a healthy diet. Avoiding tobacco and drug use. Limiting alcohol use. Practicing safe sex. Taking low doses of aspirin every day. Taking vitamin and mineral supplements as recommended by your health care provider. What happens during an annual well check? The services and screenings done by your health care provider during your annual well check will depend on your age, overall health, lifestyle risk factors, and family history of disease. Counseling  Your health care provider may ask you questions about your: Alcohol use. Tobacco use. Drug use. Emotional well-being. Home and relationship well-being. Sexual activity. Eating habits. History of falls. Memory and ability to understand (cognition). Work and work Astronomer. Screening  You may have the following tests or  measurements: Height, weight, and BMI. Blood pressure. Lipid and cholesterol levels. These may be checked every 5 years, or more frequently if you are over 67 years old. Skin check. Lung cancer screening. You may have this screening every year starting at age 12 if you have a 30-pack-year history of smoking and currently smoke or have quit within the past 15 years. Fecal occult blood test (FOBT) of the stool. You may have this test every year starting at age 4. Flexible sigmoidoscopy or colonoscopy. You may have a sigmoidoscopy every 5 years or a colonoscopy every 10 years starting at age 25. Prostate cancer screening. Recommendations will vary depending on your family history and other risks. Hepatitis C blood test. Hepatitis B blood test. Sexually transmitted disease (STD) testing. Diabetes screening. This is done by checking your blood sugar (glucose) after you have not eaten for a while (fasting). You may have this done every 1-3 years. Abdominal aortic aneurysm (AAA) screening. You may need this if you are a current or former smoker. Osteoporosis. You may be screened starting at age 52 if you are at high risk. Talk with your health care provider about your test results, treatment options, and if necessary, the need for more tests. Vaccines  Your health care provider may recommend certain vaccines, such as: Influenza vaccine. This is recommended every year. Tetanus, diphtheria, and acellular pertussis (Tdap, Td) vaccine. You may need a Td booster every 10 years. Zoster vaccine. You may need this after age 84. Pneumococcal 13-valent conjugate (PCV13) vaccine. One dose is recommended after age 30. Pneumococcal polysaccharide (PPSV23) vaccine. One dose is recommended after age 22. Talk to your health care provider about which screenings and vaccines you need and how often you need them. This information is not  intended to replace advice given to you by your health care provider. Make sure  you discuss any questions you have with your health care provider. Document Released: 09/21/2015 Document Revised: 05/14/2016 Document Reviewed: 06/26/2015 Elsevier Interactive Patient Education  2017 [employer].  Fall Prevention in the Home Falls can cause injuries. They can happen to people of all ages. There are many things you can do to make your home safe and to help prevent falls. What can I do on the outside of my home? Regularly fix the edges of walkways and driveways and fix any cracks. Remove anything that might make you trip as you walk through a door, such as a raised step or threshold. Trim any bushes or trees on the path to your home. Use bright outdoor lighting. Clear any walking paths of anything that might make someone trip, such as rocks or tools. Regularly check to see if handrails are loose or broken. Make sure that both sides of any steps have handrails. Any raised decks and porches should have guardrails on the edges. Have any leaves, snow, or ice cleared regularly. Use sand or salt on walking paths during winter. Clean up any spills in your garage right away. This includes oil or grease spills. What can I do in the bathroom? Use night lights. Install grab bars by the toilet and in the tub and shower. Do not use towel bars as grab bars. Use non-skid mats or decals in the tub or shower. If you need to sit down in the shower, use a plastic, non-slip stool. Keep the floor dry. Clean up any water that spills on the floor as soon as it happens. Remove soap buildup in the tub or shower regularly. Attach bath mats securely with double-sided non-slip rug tape. Do not have throw rugs and other things on the floor that can make you trip. What can I do in the bedroom? Use night lights. Make sure that you have a light by your bed that is easy to reach. Do not use any sheets or blankets that are too big for your bed. They should not hang down onto the floor. Have a firm  chair that has side arms. You can use this for support while you get dressed. Do not have throw rugs and other things on the floor that can make you trip. What can I do in the kitchen? Clean up any spills right away. Avoid walking on wet floors. Keep items that you use a lot in easy-to-reach places. If you need to reach something above you, use a strong step stool that has a grab bar. Keep electrical cords out of the way. Do not use floor polish or wax that makes floors slippery. If you must use wax, use non-skid floor wax. Do not have throw rugs and other things on the floor that can make you trip. What can I do with my stairs? Do not leave any items on the stairs. Make sure that there are handrails on both sides of the stairs and use them. Fix handrails that are broken or loose. Make sure that handrails are as long as the stairways. Check any carpeting to make sure that it is firmly attached to the stairs. Fix any carpet that is loose or worn. Avoid having throw rugs at the top or bottom of the stairs. If you do have throw rugs, attach them to the floor with carpet tape. Make sure that you have a light switch at the top of the stairs  and the bottom of the stairs. If you do not have them, ask someone to add them for you. What else can I do to help prevent falls? Wear shoes that: Do not have high heels. Have rubber bottoms. Are comfortable and fit you well. Are closed at the toe. Do not wear sandals. If you use a stepladder: Make sure that it is fully opened. Do not climb a closed stepladder. Make sure that both sides of the stepladder are locked into place. Ask someone to hold it for you, if possible. Clearly mark and make sure that you can see: Any grab bars or handrails. First and last steps. Where the edge of each step is. Use tools that help you move around (mobility aids) if they are needed. These include: Canes. Walkers. Scooters. Crutches. Turn on the lights when you go  into a dark area. Replace any light bulbs as soon as they burn out. Set up your furniture so you have a clear path. Avoid moving your furniture around. If any of your floors are uneven, fix them. If there are any pets around you, be aware of where they are. Review your medicines with your doctor. Some medicines can make you feel dizzy. This can increase your chance of falling. Ask your doctor what other things that you can do to help prevent falls. This information is not intended to replace advice given to you by your health care provider. Make sure you discuss any questions you have with your health care provider. Document Released: 06/21/2009 Document Revised: 01/31/2016 Document Reviewed: 09/29/2014 Elsevier Interactive Patient Education  2017 [employer].

## 2023-08-28 DIAGNOSIS — E538 Deficiency of other specified B group vitamins: Secondary | ICD-10-CM | POA: Diagnosis not present

## 2023-08-28 DIAGNOSIS — F32A Depression, unspecified: Secondary | ICD-10-CM | POA: Diagnosis not present

## 2023-08-28 DIAGNOSIS — I951 Orthostatic hypotension: Secondary | ICD-10-CM | POA: Diagnosis not present

## 2023-08-28 DIAGNOSIS — F39 Unspecified mood [affective] disorder: Secondary | ICD-10-CM | POA: Diagnosis not present

## 2023-08-28 DIAGNOSIS — G20A1 Parkinson's disease without dyskinesia, without mention of fluctuations: Secondary | ICD-10-CM | POA: Diagnosis not present

## 2023-08-28 DIAGNOSIS — E7800 Pure hypercholesterolemia, unspecified: Secondary | ICD-10-CM | POA: Diagnosis not present

## 2023-08-31 DIAGNOSIS — Z87440 Personal history of urinary (tract) infections: Secondary | ICD-10-CM | POA: Diagnosis not present

## 2023-08-31 DIAGNOSIS — Z8673 Personal history of transient ischemic attack (TIA), and cerebral infarction without residual deficits: Secondary | ICD-10-CM | POA: Diagnosis not present

## 2023-08-31 DIAGNOSIS — F32A Depression, unspecified: Secondary | ICD-10-CM | POA: Diagnosis not present

## 2023-08-31 DIAGNOSIS — I951 Orthostatic hypotension: Secondary | ICD-10-CM | POA: Diagnosis not present

## 2023-08-31 DIAGNOSIS — Z7952 Long term (current) use of systemic steroids: Secondary | ICD-10-CM | POA: Diagnosis not present

## 2023-08-31 DIAGNOSIS — Z8616 Personal history of COVID-19: Secondary | ICD-10-CM | POA: Diagnosis not present

## 2023-08-31 DIAGNOSIS — G20A1 Parkinson's disease without dyskinesia, without mention of fluctuations: Secondary | ICD-10-CM | POA: Diagnosis not present

## 2023-08-31 DIAGNOSIS — E538 Deficiency of other specified B group vitamins: Secondary | ICD-10-CM | POA: Diagnosis not present

## 2023-08-31 DIAGNOSIS — M5030 Other cervical disc degeneration, unspecified cervical region: Secondary | ICD-10-CM | POA: Diagnosis not present

## 2023-08-31 DIAGNOSIS — Z556 Problems related to health literacy: Secondary | ICD-10-CM | POA: Diagnosis not present

## 2023-08-31 DIAGNOSIS — Z9049 Acquired absence of other specified parts of digestive tract: Secondary | ICD-10-CM | POA: Diagnosis not present

## 2023-08-31 DIAGNOSIS — E7800 Pure hypercholesterolemia, unspecified: Secondary | ICD-10-CM | POA: Diagnosis not present

## 2023-08-31 DIAGNOSIS — Z9181 History of falling: Secondary | ICD-10-CM | POA: Diagnosis not present

## 2023-08-31 DIAGNOSIS — G629 Polyneuropathy, unspecified: Secondary | ICD-10-CM | POA: Diagnosis not present

## 2023-09-04 DIAGNOSIS — F32A Depression, unspecified: Secondary | ICD-10-CM | POA: Diagnosis not present

## 2023-09-04 DIAGNOSIS — G629 Polyneuropathy, unspecified: Secondary | ICD-10-CM | POA: Diagnosis not present

## 2023-09-04 DIAGNOSIS — I951 Orthostatic hypotension: Secondary | ICD-10-CM | POA: Diagnosis not present

## 2023-09-04 DIAGNOSIS — E538 Deficiency of other specified B group vitamins: Secondary | ICD-10-CM | POA: Diagnosis not present

## 2023-09-04 DIAGNOSIS — G20A1 Parkinson's disease without dyskinesia, without mention of fluctuations: Secondary | ICD-10-CM | POA: Diagnosis not present

## 2023-09-04 DIAGNOSIS — E7800 Pure hypercholesterolemia, unspecified: Secondary | ICD-10-CM | POA: Diagnosis not present

## 2023-09-08 DIAGNOSIS — F32A Depression, unspecified: Secondary | ICD-10-CM | POA: Diagnosis not present

## 2023-09-08 DIAGNOSIS — G629 Polyneuropathy, unspecified: Secondary | ICD-10-CM | POA: Diagnosis not present

## 2023-09-08 DIAGNOSIS — I951 Orthostatic hypotension: Secondary | ICD-10-CM | POA: Diagnosis not present

## 2023-09-08 DIAGNOSIS — G20A1 Parkinson's disease without dyskinesia, without mention of fluctuations: Secondary | ICD-10-CM | POA: Diagnosis not present

## 2023-09-08 DIAGNOSIS — E7800 Pure hypercholesterolemia, unspecified: Secondary | ICD-10-CM | POA: Diagnosis not present

## 2023-09-08 DIAGNOSIS — E538 Deficiency of other specified B group vitamins: Secondary | ICD-10-CM | POA: Diagnosis not present

## 2023-09-11 DIAGNOSIS — G20A1 Parkinson's disease without dyskinesia, without mention of fluctuations: Secondary | ICD-10-CM | POA: Diagnosis not present

## 2023-09-11 DIAGNOSIS — I951 Orthostatic hypotension: Secondary | ICD-10-CM | POA: Diagnosis not present

## 2023-09-11 DIAGNOSIS — E7800 Pure hypercholesterolemia, unspecified: Secondary | ICD-10-CM | POA: Diagnosis not present

## 2023-09-11 DIAGNOSIS — E538 Deficiency of other specified B group vitamins: Secondary | ICD-10-CM | POA: Diagnosis not present

## 2023-09-11 DIAGNOSIS — F32A Depression, unspecified: Secondary | ICD-10-CM | POA: Diagnosis not present

## 2023-09-11 DIAGNOSIS — G629 Polyneuropathy, unspecified: Secondary | ICD-10-CM | POA: Diagnosis not present

## 2023-09-12 DIAGNOSIS — G20A1 Parkinson's disease without dyskinesia, without mention of fluctuations: Secondary | ICD-10-CM | POA: Diagnosis not present

## 2023-09-12 DIAGNOSIS — F32A Depression, unspecified: Secondary | ICD-10-CM | POA: Diagnosis not present

## 2023-09-12 DIAGNOSIS — E7800 Pure hypercholesterolemia, unspecified: Secondary | ICD-10-CM | POA: Diagnosis not present

## 2023-09-12 DIAGNOSIS — E538 Deficiency of other specified B group vitamins: Secondary | ICD-10-CM | POA: Diagnosis not present

## 2023-09-12 DIAGNOSIS — G629 Polyneuropathy, unspecified: Secondary | ICD-10-CM | POA: Diagnosis not present

## 2023-09-12 DIAGNOSIS — I951 Orthostatic hypotension: Secondary | ICD-10-CM | POA: Diagnosis not present

## 2023-09-15 DIAGNOSIS — F32A Depression, unspecified: Secondary | ICD-10-CM | POA: Diagnosis not present

## 2023-09-15 DIAGNOSIS — G20A1 Parkinson's disease without dyskinesia, without mention of fluctuations: Secondary | ICD-10-CM | POA: Diagnosis not present

## 2023-09-15 DIAGNOSIS — I951 Orthostatic hypotension: Secondary | ICD-10-CM | POA: Diagnosis not present

## 2023-09-15 DIAGNOSIS — G629 Polyneuropathy, unspecified: Secondary | ICD-10-CM | POA: Diagnosis not present

## 2023-09-15 DIAGNOSIS — E7800 Pure hypercholesterolemia, unspecified: Secondary | ICD-10-CM | POA: Diagnosis not present

## 2023-09-15 DIAGNOSIS — E538 Deficiency of other specified B group vitamins: Secondary | ICD-10-CM | POA: Diagnosis not present

## 2023-09-16 DIAGNOSIS — E538 Deficiency of other specified B group vitamins: Secondary | ICD-10-CM | POA: Diagnosis not present

## 2023-09-16 DIAGNOSIS — I951 Orthostatic hypotension: Secondary | ICD-10-CM | POA: Diagnosis not present

## 2023-09-16 DIAGNOSIS — G20A1 Parkinson's disease without dyskinesia, without mention of fluctuations: Secondary | ICD-10-CM | POA: Diagnosis not present

## 2023-09-16 DIAGNOSIS — E7800 Pure hypercholesterolemia, unspecified: Secondary | ICD-10-CM | POA: Diagnosis not present

## 2023-09-16 DIAGNOSIS — F32A Depression, unspecified: Secondary | ICD-10-CM | POA: Diagnosis not present

## 2023-09-16 DIAGNOSIS — G629 Polyneuropathy, unspecified: Secondary | ICD-10-CM | POA: Diagnosis not present

## 2023-09-18 DIAGNOSIS — E538 Deficiency of other specified B group vitamins: Secondary | ICD-10-CM | POA: Diagnosis not present

## 2023-09-18 DIAGNOSIS — G629 Polyneuropathy, unspecified: Secondary | ICD-10-CM | POA: Diagnosis not present

## 2023-09-18 DIAGNOSIS — F32A Depression, unspecified: Secondary | ICD-10-CM | POA: Diagnosis not present

## 2023-09-18 DIAGNOSIS — I951 Orthostatic hypotension: Secondary | ICD-10-CM | POA: Diagnosis not present

## 2023-09-18 DIAGNOSIS — E7800 Pure hypercholesterolemia, unspecified: Secondary | ICD-10-CM | POA: Diagnosis not present

## 2023-09-18 DIAGNOSIS — G20A1 Parkinson's disease without dyskinesia, without mention of fluctuations: Secondary | ICD-10-CM | POA: Diagnosis not present

## 2023-09-21 DIAGNOSIS — I951 Orthostatic hypotension: Secondary | ICD-10-CM | POA: Diagnosis not present

## 2023-09-21 DIAGNOSIS — Z87440 Personal history of urinary (tract) infections: Secondary | ICD-10-CM | POA: Diagnosis not present

## 2023-09-21 DIAGNOSIS — M5030 Other cervical disc degeneration, unspecified cervical region: Secondary | ICD-10-CM | POA: Diagnosis not present

## 2023-09-21 DIAGNOSIS — G20A1 Parkinson's disease without dyskinesia, without mention of fluctuations: Secondary | ICD-10-CM | POA: Diagnosis not present

## 2023-09-21 DIAGNOSIS — E538 Deficiency of other specified B group vitamins: Secondary | ICD-10-CM | POA: Diagnosis not present

## 2023-09-21 DIAGNOSIS — Z8673 Personal history of transient ischemic attack (TIA), and cerebral infarction without residual deficits: Secondary | ICD-10-CM | POA: Diagnosis not present

## 2023-09-21 DIAGNOSIS — Z556 Problems related to health literacy: Secondary | ICD-10-CM | POA: Diagnosis not present

## 2023-09-21 DIAGNOSIS — F32A Depression, unspecified: Secondary | ICD-10-CM | POA: Diagnosis not present

## 2023-09-21 DIAGNOSIS — Z9049 Acquired absence of other specified parts of digestive tract: Secondary | ICD-10-CM | POA: Diagnosis not present

## 2023-09-21 DIAGNOSIS — G629 Polyneuropathy, unspecified: Secondary | ICD-10-CM | POA: Diagnosis not present

## 2023-09-21 DIAGNOSIS — E7800 Pure hypercholesterolemia, unspecified: Secondary | ICD-10-CM | POA: Diagnosis not present

## 2023-09-21 DIAGNOSIS — Z7952 Long term (current) use of systemic steroids: Secondary | ICD-10-CM | POA: Diagnosis not present

## 2023-09-30 DIAGNOSIS — Z8673 Personal history of transient ischemic attack (TIA), and cerebral infarction without residual deficits: Secondary | ICD-10-CM | POA: Diagnosis not present

## 2023-09-30 DIAGNOSIS — Z9049 Acquired absence of other specified parts of digestive tract: Secondary | ICD-10-CM | POA: Diagnosis not present

## 2023-09-30 DIAGNOSIS — E7800 Pure hypercholesterolemia, unspecified: Secondary | ICD-10-CM | POA: Diagnosis not present

## 2023-09-30 DIAGNOSIS — F32A Depression, unspecified: Secondary | ICD-10-CM | POA: Diagnosis not present

## 2023-09-30 DIAGNOSIS — G629 Polyneuropathy, unspecified: Secondary | ICD-10-CM | POA: Diagnosis not present

## 2023-09-30 DIAGNOSIS — I951 Orthostatic hypotension: Secondary | ICD-10-CM | POA: Diagnosis not present

## 2023-09-30 DIAGNOSIS — G20A1 Parkinson's disease without dyskinesia, without mention of fluctuations: Secondary | ICD-10-CM | POA: Diagnosis not present

## 2023-09-30 DIAGNOSIS — Z87440 Personal history of urinary (tract) infections: Secondary | ICD-10-CM | POA: Diagnosis not present

## 2023-09-30 DIAGNOSIS — E538 Deficiency of other specified B group vitamins: Secondary | ICD-10-CM | POA: Diagnosis not present

## 2023-09-30 DIAGNOSIS — Z7952 Long term (current) use of systemic steroids: Secondary | ICD-10-CM | POA: Diagnosis not present

## 2023-09-30 DIAGNOSIS — Z8616 Personal history of COVID-19: Secondary | ICD-10-CM | POA: Diagnosis not present

## 2023-09-30 DIAGNOSIS — Z9181 History of falling: Secondary | ICD-10-CM | POA: Diagnosis not present

## 2023-09-30 DIAGNOSIS — Z556 Problems related to health literacy: Secondary | ICD-10-CM | POA: Diagnosis not present

## 2023-09-30 DIAGNOSIS — M5030 Other cervical disc degeneration, unspecified cervical region: Secondary | ICD-10-CM | POA: Diagnosis not present

## 2023-10-02 ENCOUNTER — Telehealth: Payer: Self-pay

## 2023-10-02 DIAGNOSIS — G629 Polyneuropathy, unspecified: Secondary | ICD-10-CM | POA: Diagnosis not present

## 2023-10-02 DIAGNOSIS — I951 Orthostatic hypotension: Secondary | ICD-10-CM | POA: Diagnosis not present

## 2023-10-02 DIAGNOSIS — E7800 Pure hypercholesterolemia, unspecified: Secondary | ICD-10-CM | POA: Diagnosis not present

## 2023-10-02 DIAGNOSIS — F32A Depression, unspecified: Secondary | ICD-10-CM | POA: Diagnosis not present

## 2023-10-02 DIAGNOSIS — E538 Deficiency of other specified B group vitamins: Secondary | ICD-10-CM | POA: Diagnosis not present

## 2023-10-02 DIAGNOSIS — G20A1 Parkinson's disease without dyskinesia, without mention of fluctuations: Secondary | ICD-10-CM | POA: Diagnosis not present

## 2023-10-02 NOTE — Telephone Encounter (Signed)
PT rechecked pt's BP while I was on the phone with [name], pt's wife, and it was 214/109. Strongly encouraged [name] to take the patient to the ER for evaluation and she states she will. Mjp,lpn  Copied from CRM [id number]. Topic: General - Other >> Oct 02, 2023  4:57 PM Antony Haste wrote: Caller/Agency: Physical Therapist - [facility] Callback Number: [phone] Any new concerns about the patient? Yes, his blood pressure is running high, 198/101. His physical therapist states she will reassess him. She is wanting a callback from one of the nurses.

## 2023-10-05 ENCOUNTER — Other Ambulatory Visit: Payer: Self-pay

## 2023-10-05 DIAGNOSIS — G20A1 Parkinson's disease without dyskinesia, without mention of fluctuations: Secondary | ICD-10-CM

## 2023-10-05 DIAGNOSIS — G20C Parkinsonism, unspecified: Secondary | ICD-10-CM

## 2023-10-05 MED ORDER — CLONIDINE HCL 0.1 MG PO TABS
0.1000 mg | ORAL_TABLET | Freq: Three times a day (TID) | ORAL | 3 refills | Status: DC | PRN
Start: 2023-10-05 — End: 2024-03-06

## 2023-10-07 DIAGNOSIS — E7800 Pure hypercholesterolemia, unspecified: Secondary | ICD-10-CM | POA: Diagnosis not present

## 2023-10-07 DIAGNOSIS — G629 Polyneuropathy, unspecified: Secondary | ICD-10-CM | POA: Diagnosis not present

## 2023-10-07 DIAGNOSIS — G20A1 Parkinson's disease without dyskinesia, without mention of fluctuations: Secondary | ICD-10-CM | POA: Diagnosis not present

## 2023-10-07 DIAGNOSIS — F32A Depression, unspecified: Secondary | ICD-10-CM | POA: Diagnosis not present

## 2023-10-07 DIAGNOSIS — I951 Orthostatic hypotension: Secondary | ICD-10-CM | POA: Diagnosis not present

## 2023-10-07 DIAGNOSIS — E538 Deficiency of other specified B group vitamins: Secondary | ICD-10-CM | POA: Diagnosis not present

## 2023-10-08 DIAGNOSIS — F32A Depression, unspecified: Secondary | ICD-10-CM | POA: Diagnosis not present

## 2023-10-08 DIAGNOSIS — G20A1 Parkinson's disease without dyskinesia, without mention of fluctuations: Secondary | ICD-10-CM | POA: Diagnosis not present

## 2023-10-08 DIAGNOSIS — E538 Deficiency of other specified B group vitamins: Secondary | ICD-10-CM | POA: Diagnosis not present

## 2023-10-08 DIAGNOSIS — I951 Orthostatic hypotension: Secondary | ICD-10-CM | POA: Diagnosis not present

## 2023-10-08 DIAGNOSIS — G629 Polyneuropathy, unspecified: Secondary | ICD-10-CM | POA: Diagnosis not present

## 2023-10-08 DIAGNOSIS — E7800 Pure hypercholesterolemia, unspecified: Secondary | ICD-10-CM | POA: Diagnosis not present

## 2023-10-14 DIAGNOSIS — E538 Deficiency of other specified B group vitamins: Secondary | ICD-10-CM | POA: Diagnosis not present

## 2023-10-14 DIAGNOSIS — G629 Polyneuropathy, unspecified: Secondary | ICD-10-CM | POA: Diagnosis not present

## 2023-10-14 DIAGNOSIS — F32A Depression, unspecified: Secondary | ICD-10-CM | POA: Diagnosis not present

## 2023-10-14 DIAGNOSIS — G20A1 Parkinson's disease without dyskinesia, without mention of fluctuations: Secondary | ICD-10-CM | POA: Diagnosis not present

## 2023-10-14 DIAGNOSIS — E7800 Pure hypercholesterolemia, unspecified: Secondary | ICD-10-CM | POA: Diagnosis not present

## 2023-10-14 DIAGNOSIS — I951 Orthostatic hypotension: Secondary | ICD-10-CM | POA: Diagnosis not present

## 2023-10-15 DIAGNOSIS — E538 Deficiency of other specified B group vitamins: Secondary | ICD-10-CM | POA: Diagnosis not present

## 2023-10-15 DIAGNOSIS — I951 Orthostatic hypotension: Secondary | ICD-10-CM | POA: Diagnosis not present

## 2023-10-15 DIAGNOSIS — G20A1 Parkinson's disease without dyskinesia, without mention of fluctuations: Secondary | ICD-10-CM | POA: Diagnosis not present

## 2023-10-15 DIAGNOSIS — E7800 Pure hypercholesterolemia, unspecified: Secondary | ICD-10-CM | POA: Diagnosis not present

## 2023-10-15 DIAGNOSIS — F32A Depression, unspecified: Secondary | ICD-10-CM | POA: Diagnosis not present

## 2023-10-15 DIAGNOSIS — G629 Polyneuropathy, unspecified: Secondary | ICD-10-CM | POA: Diagnosis not present

## 2023-10-17 ENCOUNTER — Other Ambulatory Visit: Payer: Self-pay | Admitting: Family Medicine

## 2023-10-17 DIAGNOSIS — E785 Hyperlipidemia, unspecified: Secondary | ICD-10-CM

## 2023-10-19 ENCOUNTER — Other Ambulatory Visit: Payer: Self-pay | Admitting: Family Medicine

## 2023-10-19 DIAGNOSIS — E785 Hyperlipidemia, unspecified: Secondary | ICD-10-CM

## 2023-10-19 NOTE — Telephone Encounter (Signed)
 Pt requesting for PRAVASTATIN  20MG  TABLETS however it doesn't look as though it is active, last refill was in 11/23?  Pls advise?

## 2023-10-21 DIAGNOSIS — G629 Polyneuropathy, unspecified: Secondary | ICD-10-CM | POA: Diagnosis not present

## 2023-10-21 DIAGNOSIS — F32A Depression, unspecified: Secondary | ICD-10-CM | POA: Diagnosis not present

## 2023-10-21 DIAGNOSIS — I951 Orthostatic hypotension: Secondary | ICD-10-CM | POA: Diagnosis not present

## 2023-10-21 DIAGNOSIS — E7800 Pure hypercholesterolemia, unspecified: Secondary | ICD-10-CM | POA: Diagnosis not present

## 2023-10-21 DIAGNOSIS — G20A1 Parkinson's disease without dyskinesia, without mention of fluctuations: Secondary | ICD-10-CM | POA: Diagnosis not present

## 2023-10-21 DIAGNOSIS — E538 Deficiency of other specified B group vitamins: Secondary | ICD-10-CM | POA: Diagnosis not present

## 2023-10-22 DIAGNOSIS — E538 Deficiency of other specified B group vitamins: Secondary | ICD-10-CM | POA: Diagnosis not present

## 2023-10-22 DIAGNOSIS — G20A1 Parkinson's disease without dyskinesia, without mention of fluctuations: Secondary | ICD-10-CM | POA: Diagnosis not present

## 2023-10-22 DIAGNOSIS — G629 Polyneuropathy, unspecified: Secondary | ICD-10-CM | POA: Diagnosis not present

## 2023-10-22 DIAGNOSIS — F32A Depression, unspecified: Secondary | ICD-10-CM | POA: Diagnosis not present

## 2023-10-22 DIAGNOSIS — E7800 Pure hypercholesterolemia, unspecified: Secondary | ICD-10-CM | POA: Diagnosis not present

## 2023-10-22 DIAGNOSIS — I951 Orthostatic hypotension: Secondary | ICD-10-CM | POA: Diagnosis not present

## 2023-10-26 ENCOUNTER — Telehealth: Payer: Self-pay

## 2023-10-26 ENCOUNTER — Other Ambulatory Visit: Payer: Self-pay

## 2023-10-26 MED ORDER — MIDODRINE HCL 10 MG PO TABS: 10.0000 mg | ORAL_TABLET | Freq: Three times a day (TID) | ORAL | 1 refills | Status: DC | PRN

## 2023-10-26 NOTE — Telephone Encounter (Signed)
 Called spoke w/Debbie w/[facility]: give verbal "ok" orders for pt to reci'  Physical Therapy and Home Aid Frequency: Extend PT 1 week 1  re certification  1 week 8 (9 weeks) Home health Aid 2 week 8 for re certification   Pcp is aware

## 2023-10-26 NOTE — Telephone Encounter (Signed)
 Attempted to call nurse, Debbie w/[facility] back. LVM for return call

## 2023-10-26 NOTE — Telephone Encounter (Signed)
 Copied from CRM [id number]. Topic: Clinical - Home Health Verbal Orders >> Oct 26, 2023 10:15 AM Gery Pray wrote: Caller/Agency: [facility] Callback Number: [phone] Service Requested: Physical Therapy and Home Aid Frequency: Extend PT 1 week 1  re certification  1 week 8 (9 weeks) Home health Aid 2 week 8 for re certification  Any new concerns about the patient? No

## 2023-10-27 DIAGNOSIS — E538 Deficiency of other specified B group vitamins: Secondary | ICD-10-CM | POA: Diagnosis not present

## 2023-10-27 DIAGNOSIS — G629 Polyneuropathy, unspecified: Secondary | ICD-10-CM | POA: Diagnosis not present

## 2023-10-27 DIAGNOSIS — E7800 Pure hypercholesterolemia, unspecified: Secondary | ICD-10-CM | POA: Diagnosis not present

## 2023-10-27 DIAGNOSIS — F32A Depression, unspecified: Secondary | ICD-10-CM | POA: Diagnosis not present

## 2023-10-27 DIAGNOSIS — G20A1 Parkinson's disease without dyskinesia, without mention of fluctuations: Secondary | ICD-10-CM | POA: Diagnosis not present

## 2023-10-27 DIAGNOSIS — I951 Orthostatic hypotension: Secondary | ICD-10-CM | POA: Diagnosis not present

## 2023-10-30 ENCOUNTER — Other Ambulatory Visit: Payer: Self-pay

## 2023-10-30 ENCOUNTER — Encounter: Payer: Self-pay | Admitting: Family Medicine

## 2023-10-30 ENCOUNTER — Ambulatory Visit: Payer: Self-pay | Admitting: Family Medicine

## 2023-10-30 DIAGNOSIS — I951 Orthostatic hypotension: Secondary | ICD-10-CM | POA: Diagnosis not present

## 2023-10-30 DIAGNOSIS — G20A1 Parkinson's disease without dyskinesia, without mention of fluctuations: Secondary | ICD-10-CM

## 2023-10-30 DIAGNOSIS — F32A Depression, unspecified: Secondary | ICD-10-CM | POA: Diagnosis not present

## 2023-10-30 DIAGNOSIS — Z8616 Personal history of COVID-19: Secondary | ICD-10-CM | POA: Diagnosis not present

## 2023-10-30 DIAGNOSIS — M6281 Muscle weakness (generalized): Secondary | ICD-10-CM | POA: Diagnosis not present

## 2023-10-30 DIAGNOSIS — Z7952 Long term (current) use of systemic steroids: Secondary | ICD-10-CM | POA: Diagnosis not present

## 2023-10-30 DIAGNOSIS — R2689 Other abnormalities of gait and mobility: Secondary | ICD-10-CM | POA: Diagnosis not present

## 2023-10-30 DIAGNOSIS — Z8673 Personal history of transient ischemic attack (TIA), and cerebral infarction without residual deficits: Secondary | ICD-10-CM | POA: Diagnosis not present

## 2023-10-30 DIAGNOSIS — E7800 Pure hypercholesterolemia, unspecified: Secondary | ICD-10-CM | POA: Diagnosis not present

## 2023-10-30 DIAGNOSIS — E538 Deficiency of other specified B group vitamins: Secondary | ICD-10-CM | POA: Diagnosis not present

## 2023-10-30 DIAGNOSIS — M5030 Other cervical disc degeneration, unspecified cervical region: Secondary | ICD-10-CM | POA: Diagnosis not present

## 2023-10-30 DIAGNOSIS — Z87440 Personal history of urinary (tract) infections: Secondary | ICD-10-CM | POA: Diagnosis not present

## 2023-10-30 DIAGNOSIS — Z9181 History of falling: Secondary | ICD-10-CM | POA: Diagnosis not present

## 2023-10-30 DIAGNOSIS — Z9049 Acquired absence of other specified parts of digestive tract: Secondary | ICD-10-CM | POA: Diagnosis not present

## 2023-10-30 DIAGNOSIS — Z556 Problems related to health literacy: Secondary | ICD-10-CM | POA: Diagnosis not present

## 2023-10-30 DIAGNOSIS — G629 Polyneuropathy, unspecified: Secondary | ICD-10-CM | POA: Diagnosis not present

## 2023-10-30 MED ORDER — MIDODRINE HCL 10 MG PO TABS: 10.0000 mg | ORAL_TABLET | Freq: Three times a day (TID) | ORAL | 1 refills | Status: DC

## 2023-10-30 NOTE — Telephone Encounter (Signed)
 Copied from CRM [id number]. Topic: Clinical - Prescription Issue >> Oct 30, 2023  3:25 PM Dondra Prader A wrote: Reason for CRM: Alex Frazier patient wife is calling in regards to his medication: midodrine (PROAMATINE) 10 MG tablet.  Per Alex Frazier his prescription has changed and she is paying more money for the medication and would like a call back to discuss.

## 2023-10-30 NOTE — Telephone Encounter (Signed)
See msg and advise

## 2023-11-02 ENCOUNTER — Other Ambulatory Visit: Payer: Self-pay | Admitting: Family Medicine

## 2023-11-02 DIAGNOSIS — G20A1 Parkinson's disease without dyskinesia, without mention of fluctuations: Secondary | ICD-10-CM

## 2023-11-02 DIAGNOSIS — I951 Orthostatic hypotension: Secondary | ICD-10-CM

## 2023-11-04 DIAGNOSIS — I951 Orthostatic hypotension: Secondary | ICD-10-CM | POA: Diagnosis not present

## 2023-11-04 DIAGNOSIS — G20A1 Parkinson's disease without dyskinesia, without mention of fluctuations: Secondary | ICD-10-CM | POA: Diagnosis not present

## 2023-11-04 DIAGNOSIS — E538 Deficiency of other specified B group vitamins: Secondary | ICD-10-CM | POA: Diagnosis not present

## 2023-11-04 DIAGNOSIS — E7800 Pure hypercholesterolemia, unspecified: Secondary | ICD-10-CM | POA: Diagnosis not present

## 2023-11-04 DIAGNOSIS — Z7952 Long term (current) use of systemic steroids: Secondary | ICD-10-CM | POA: Diagnosis not present

## 2023-11-04 DIAGNOSIS — F32A Depression, unspecified: Secondary | ICD-10-CM | POA: Diagnosis not present

## 2023-11-05 DIAGNOSIS — I951 Orthostatic hypotension: Secondary | ICD-10-CM | POA: Diagnosis not present

## 2023-11-05 DIAGNOSIS — G20A1 Parkinson's disease without dyskinesia, without mention of fluctuations: Secondary | ICD-10-CM | POA: Diagnosis not present

## 2023-11-05 DIAGNOSIS — F32A Depression, unspecified: Secondary | ICD-10-CM | POA: Diagnosis not present

## 2023-11-05 DIAGNOSIS — Z7952 Long term (current) use of systemic steroids: Secondary | ICD-10-CM | POA: Diagnosis not present

## 2023-11-05 DIAGNOSIS — E7800 Pure hypercholesterolemia, unspecified: Secondary | ICD-10-CM | POA: Diagnosis not present

## 2023-11-05 DIAGNOSIS — E538 Deficiency of other specified B group vitamins: Secondary | ICD-10-CM | POA: Diagnosis not present

## 2023-11-10 DIAGNOSIS — E538 Deficiency of other specified B group vitamins: Secondary | ICD-10-CM | POA: Diagnosis not present

## 2023-11-10 DIAGNOSIS — I951 Orthostatic hypotension: Secondary | ICD-10-CM | POA: Diagnosis not present

## 2023-11-10 DIAGNOSIS — Z7952 Long term (current) use of systemic steroids: Secondary | ICD-10-CM | POA: Diagnosis not present

## 2023-11-10 DIAGNOSIS — G20A1 Parkinson's disease without dyskinesia, without mention of fluctuations: Secondary | ICD-10-CM | POA: Diagnosis not present

## 2023-11-10 DIAGNOSIS — E7800 Pure hypercholesterolemia, unspecified: Secondary | ICD-10-CM | POA: Diagnosis not present

## 2023-11-10 DIAGNOSIS — F32A Depression, unspecified: Secondary | ICD-10-CM | POA: Diagnosis not present

## 2023-11-12 DIAGNOSIS — E7800 Pure hypercholesterolemia, unspecified: Secondary | ICD-10-CM | POA: Diagnosis not present

## 2023-11-12 DIAGNOSIS — G20A1 Parkinson's disease without dyskinesia, without mention of fluctuations: Secondary | ICD-10-CM | POA: Diagnosis not present

## 2023-11-12 DIAGNOSIS — Z7952 Long term (current) use of systemic steroids: Secondary | ICD-10-CM | POA: Diagnosis not present

## 2023-11-12 DIAGNOSIS — I951 Orthostatic hypotension: Secondary | ICD-10-CM | POA: Diagnosis not present

## 2023-11-12 DIAGNOSIS — F32A Depression, unspecified: Secondary | ICD-10-CM | POA: Diagnosis not present

## 2023-11-12 DIAGNOSIS — E538 Deficiency of other specified B group vitamins: Secondary | ICD-10-CM | POA: Diagnosis not present

## 2023-11-16 DIAGNOSIS — Z7952 Long term (current) use of systemic steroids: Secondary | ICD-10-CM | POA: Diagnosis not present

## 2023-11-16 DIAGNOSIS — E7800 Pure hypercholesterolemia, unspecified: Secondary | ICD-10-CM | POA: Diagnosis not present

## 2023-11-16 DIAGNOSIS — G20A1 Parkinson's disease without dyskinesia, without mention of fluctuations: Secondary | ICD-10-CM | POA: Diagnosis not present

## 2023-11-16 DIAGNOSIS — I951 Orthostatic hypotension: Secondary | ICD-10-CM | POA: Diagnosis not present

## 2023-11-16 DIAGNOSIS — F32A Depression, unspecified: Secondary | ICD-10-CM | POA: Diagnosis not present

## 2023-11-16 DIAGNOSIS — E538 Deficiency of other specified B group vitamins: Secondary | ICD-10-CM | POA: Diagnosis not present

## 2023-11-19 DIAGNOSIS — G20A1 Parkinson's disease without dyskinesia, without mention of fluctuations: Secondary | ICD-10-CM | POA: Diagnosis not present

## 2023-11-19 DIAGNOSIS — I951 Orthostatic hypotension: Secondary | ICD-10-CM | POA: Diagnosis not present

## 2023-11-19 DIAGNOSIS — E7800 Pure hypercholesterolemia, unspecified: Secondary | ICD-10-CM | POA: Diagnosis not present

## 2023-11-19 DIAGNOSIS — E538 Deficiency of other specified B group vitamins: Secondary | ICD-10-CM | POA: Diagnosis not present

## 2023-11-19 DIAGNOSIS — F32A Depression, unspecified: Secondary | ICD-10-CM | POA: Diagnosis not present

## 2023-11-19 DIAGNOSIS — Z7952 Long term (current) use of systemic steroids: Secondary | ICD-10-CM | POA: Diagnosis not present

## 2023-11-24 ENCOUNTER — Other Ambulatory Visit: Payer: Self-pay

## 2023-11-24 ENCOUNTER — Ambulatory Visit: Payer: Self-pay | Admitting: *Deleted

## 2023-11-24 ENCOUNTER — Telehealth: Payer: Self-pay

## 2023-11-24 DIAGNOSIS — G609 Hereditary and idiopathic neuropathy, unspecified: Secondary | ICD-10-CM

## 2023-11-24 DIAGNOSIS — F32A Depression, unspecified: Secondary | ICD-10-CM | POA: Diagnosis not present

## 2023-11-24 DIAGNOSIS — G20A1 Parkinson's disease without dyskinesia, without mention of fluctuations: Secondary | ICD-10-CM | POA: Diagnosis not present

## 2023-11-24 DIAGNOSIS — I951 Orthostatic hypotension: Secondary | ICD-10-CM | POA: Diagnosis not present

## 2023-11-24 DIAGNOSIS — E538 Deficiency of other specified B group vitamins: Secondary | ICD-10-CM | POA: Diagnosis not present

## 2023-11-24 DIAGNOSIS — E7800 Pure hypercholesterolemia, unspecified: Secondary | ICD-10-CM | POA: Diagnosis not present

## 2023-11-24 DIAGNOSIS — Z7952 Long term (current) use of systemic steroids: Secondary | ICD-10-CM | POA: Diagnosis not present

## 2023-11-24 NOTE — Patient Instructions (Signed)
 Visit Information  Thank you for taking time to visit with me today. Please don't hesitate to contact me if I can be of assistance to you.    Manson Passey [place] RN CM after April 2025- Delsa Sale [phone] Dr Broadus John will order a referral to neurology   Following are the goals we discussed today:   Goals Addressed             This Visit's Progress    Home management of falls, poor ambulation, recurrent uti , parkinson (Care coordination services)       Care Coordination Interventions: Evaluation of current treatment plan related to Parkinson's disease, UTI and patient's adherence to plan as established by provider confirmed with wife his symptoms are "progressing"  Care giver goal: Mrs Wessinger will have patient seen by neurology closer to their home (pcp neurology referral) Interventions Today    Flowsheet Row Most Recent Value  Chronic Disease   Chronic disease during today's visit Other  [mobility/want lift (parkinson, back ), difficulty getting to pcp office, Covid/UTI]  General Interventions   General Interventions Discussed/Reviewed General Interventions Reviewed, Durable Medical Equipment (DME), Doctor Visits  Doctor Visits Discussed/Reviewed Doctor Visits Reviewed, PCP  Durable Medical Equipment (DME) Other  [voiced interested in a Stedy]  PCP/Specialist Visits Compliance with follow-up visit  Exercise Interventions   Exercise Discussed/Reviewed Exercise Reviewed, Physical Activity, Assistive device use and maintanence  Education Interventions   Education Provided Provided Education  Provided Verbal Education On Other, Veterinary surgeon medical equipment]  Mental Health Interventions   Mental Health Discussed/Reviewed Mental Health Reviewed, Coping Strategies  Pharmacy Interventions   Pharmacy Dicussed/Reviewed Pharmacy Topics Reviewed, Affording Medications  Safety Interventions   Safety Discussed/Reviewed Safety Reviewed, Home Safety, Fall Risk  Home Safety  Assistive Devices                 Our next appointment is by telephone on 12/29/23 at 2 pm  Please call the care guide team at [phone] if you need to cancel or reschedule your appointment.   If you are experiencing a Mental Health or Behavioral Health Crisis or need someone to talk to, please call the Suicide and Crisis Lifeline: 988 call the [place] National Suicide Prevention Lifeline: 254-644-2886 or TTY: 3512510185 TTY 6057017771) to talk to a trained counselor call 1-800-273-TALK (toll free, 24 hour hotline) go to [facility] [address] [place] [phone]) call the [facility] Crisis Line: 281-678-0232 call 911   Patient verbalizes understanding of instructions and care plan provided today and agrees to view in MyChart. Active MyChart status and patient understanding of how to access instructions and care plan via MyChart confirmed with patient.     The patient has been provided with contact information for the care management team and has been advised to call with any health related questions or concerns.   Shantea Poulton L. Noelle Penner, RN, BSN, CCM [name]  [facility], [facility] RN Care Manager Direct Dial: [phone]  Fax: [phone]

## 2023-11-24 NOTE — Patient Outreach (Signed)
 Care Coordination   Follow Up Visit Note   11/24/2023 Name: Alex Frazier MRN: [id number] DOB: 08/14/1946  Alex Frazier is a 78 y.o. year old male who sees Pickard, Priscille Heidelberg, MD for primary care. I spoke with  Alex Frazier by phone today.  What matters to the patients health and wellness today?  Midodrine   Home Health0 PT/OT comes once a week each. They work with his mobility No skin breakdown Wife still not using the purchased hoyer lift She transfers patient without it. Voiced understanding that the [facility] can   No longer able to walk with u-step or other walkers Use wheelchair and recliner a lot  Wife reports he is having more trouble with motor skills Need two strong people to help get patient in the car Saw neurologist in [place]  Do Dr Tanya Nones think can transfer to neurologist closer to home to care of Dr Tanya Nones Wife concern about if change if patient will get his medicines   Goals Addressed             This Visit's Progress    Home management of falls, poor ambulation, recurrent uti , parkinson (Care coordination services)       Care Coordination Interventions: Evaluation of current treatment plan related to Parkinson's disease, UTI and patient's adherence to plan as established by provider confirmed with wife his symptoms are "progressing"  Care giver goal: [name] will have patient seen by neurology closer to their home (pcp neurology referral) Interventions Today    Flowsheet Row Most Recent Value  Chronic Disease   Chronic disease during today's visit Other  [mobility/want lift (parkinson, back ), difficulty getting to pcp office, Covid/UTI]  General Interventions   General Interventions Discussed/Reviewed General Interventions Reviewed, Durable Medical Equipment (DME), Doctor Visits  Doctor Visits Discussed/Reviewed Doctor Visits Reviewed, PCP  Durable Medical Equipment (DME) Other  [voiced interested in a Stedy]  PCP/Specialist Visits Compliance  with follow-up visit  Exercise Interventions   Exercise Discussed/Reviewed Exercise Reviewed, Physical Activity, Assistive device use and maintanence  Education Interventions   Education Provided Provided Education  Provided Verbal Education On Other, Veterinary surgeon medical equipment]  Mental Health Interventions   Mental Health Discussed/Reviewed Mental Health Reviewed, Coping Strategies  Pharmacy Interventions   Pharmacy Dicussed/Reviewed Pharmacy Topics Reviewed, Affording Medications  Safety Interventions   Safety Discussed/Reviewed Safety Reviewed, Home Safety, Fall Risk  Home Safety Assistive Devices                 SDOH assessments and interventions completed:  No     Care Coordination Interventions:  Yes, provided   Follow up plan: No further intervention required. Follow up call scheduled for 12/29/23 Warm transfer to [place] RN CM, Delsa Sale,      Encounter Outcome:  Patient Visit Completed   Kathreen Cosier. Noelle Penner, RN, BSN, CCM [place]  [facility], [facility] Health RN Care Manager Direct Dial: [phone]  Fax: [phone]

## 2023-11-24 NOTE — Telephone Encounter (Signed)
 Message from Care Coordinator RN, Edd Arbour: Alex Frazier, wife of Alex Frazier wants to ask him if it would be better to get  a referral for a neurologist closer to [place] or to have Dr Tanya Nones handle all the care for Alex Frazier?   Are you okay with a closer referral? Thanks.

## 2023-11-27 DIAGNOSIS — F32A Depression, unspecified: Secondary | ICD-10-CM | POA: Diagnosis not present

## 2023-11-27 DIAGNOSIS — G20A1 Parkinson's disease without dyskinesia, without mention of fluctuations: Secondary | ICD-10-CM | POA: Diagnosis not present

## 2023-11-27 DIAGNOSIS — E538 Deficiency of other specified B group vitamins: Secondary | ICD-10-CM | POA: Diagnosis not present

## 2023-11-27 DIAGNOSIS — I951 Orthostatic hypotension: Secondary | ICD-10-CM | POA: Diagnosis not present

## 2023-11-27 DIAGNOSIS — Z7952 Long term (current) use of systemic steroids: Secondary | ICD-10-CM | POA: Diagnosis not present

## 2023-11-27 DIAGNOSIS — E7800 Pure hypercholesterolemia, unspecified: Secondary | ICD-10-CM | POA: Diagnosis not present

## 2023-11-29 DIAGNOSIS — Z8616 Personal history of COVID-19: Secondary | ICD-10-CM | POA: Diagnosis not present

## 2023-11-29 DIAGNOSIS — M6281 Muscle weakness (generalized): Secondary | ICD-10-CM | POA: Diagnosis not present

## 2023-11-29 DIAGNOSIS — G20A1 Parkinson's disease without dyskinesia, without mention of fluctuations: Secondary | ICD-10-CM | POA: Diagnosis not present

## 2023-11-29 DIAGNOSIS — Z9049 Acquired absence of other specified parts of digestive tract: Secondary | ICD-10-CM | POA: Diagnosis not present

## 2023-11-29 DIAGNOSIS — Z9181 History of falling: Secondary | ICD-10-CM | POA: Diagnosis not present

## 2023-11-29 DIAGNOSIS — Z87440 Personal history of urinary (tract) infections: Secondary | ICD-10-CM | POA: Diagnosis not present

## 2023-11-29 DIAGNOSIS — F32A Depression, unspecified: Secondary | ICD-10-CM | POA: Diagnosis not present

## 2023-11-29 DIAGNOSIS — E7800 Pure hypercholesterolemia, unspecified: Secondary | ICD-10-CM | POA: Diagnosis not present

## 2023-11-29 DIAGNOSIS — Z8673 Personal history of transient ischemic attack (TIA), and cerebral infarction without residual deficits: Secondary | ICD-10-CM | POA: Diagnosis not present

## 2023-11-29 DIAGNOSIS — Z556 Problems related to health literacy: Secondary | ICD-10-CM | POA: Diagnosis not present

## 2023-11-29 DIAGNOSIS — Z7952 Long term (current) use of systemic steroids: Secondary | ICD-10-CM | POA: Diagnosis not present

## 2023-11-29 DIAGNOSIS — I951 Orthostatic hypotension: Secondary | ICD-10-CM | POA: Diagnosis not present

## 2023-11-29 DIAGNOSIS — G629 Polyneuropathy, unspecified: Secondary | ICD-10-CM | POA: Diagnosis not present

## 2023-11-29 DIAGNOSIS — R2689 Other abnormalities of gait and mobility: Secondary | ICD-10-CM | POA: Diagnosis not present

## 2023-11-29 DIAGNOSIS — E538 Deficiency of other specified B group vitamins: Secondary | ICD-10-CM | POA: Diagnosis not present

## 2023-11-29 DIAGNOSIS — M5030 Other cervical disc degeneration, unspecified cervical region: Secondary | ICD-10-CM | POA: Diagnosis not present

## 2023-12-01 DIAGNOSIS — Z7952 Long term (current) use of systemic steroids: Secondary | ICD-10-CM | POA: Diagnosis not present

## 2023-12-01 DIAGNOSIS — G20A1 Parkinson's disease without dyskinesia, without mention of fluctuations: Secondary | ICD-10-CM | POA: Diagnosis not present

## 2023-12-01 DIAGNOSIS — F32A Depression, unspecified: Secondary | ICD-10-CM | POA: Diagnosis not present

## 2023-12-01 DIAGNOSIS — E7800 Pure hypercholesterolemia, unspecified: Secondary | ICD-10-CM | POA: Diagnosis not present

## 2023-12-01 DIAGNOSIS — E538 Deficiency of other specified B group vitamins: Secondary | ICD-10-CM | POA: Diagnosis not present

## 2023-12-01 DIAGNOSIS — I951 Orthostatic hypotension: Secondary | ICD-10-CM | POA: Diagnosis not present

## 2023-12-04 ENCOUNTER — Encounter: Payer: Self-pay | Admitting: Family Medicine

## 2023-12-04 ENCOUNTER — Other Ambulatory Visit: Payer: Self-pay

## 2023-12-04 DIAGNOSIS — E538 Deficiency of other specified B group vitamins: Secondary | ICD-10-CM | POA: Diagnosis not present

## 2023-12-04 DIAGNOSIS — Z7952 Long term (current) use of systemic steroids: Secondary | ICD-10-CM | POA: Diagnosis not present

## 2023-12-04 DIAGNOSIS — F32A Depression, unspecified: Secondary | ICD-10-CM | POA: Diagnosis not present

## 2023-12-04 DIAGNOSIS — E7800 Pure hypercholesterolemia, unspecified: Secondary | ICD-10-CM | POA: Diagnosis not present

## 2023-12-04 DIAGNOSIS — I951 Orthostatic hypotension: Secondary | ICD-10-CM | POA: Diagnosis not present

## 2023-12-04 DIAGNOSIS — G20A1 Parkinson's disease without dyskinesia, without mention of fluctuations: Secondary | ICD-10-CM | POA: Diagnosis not present

## 2023-12-04 MED ORDER — FLUOXETINE HCL 20 MG PO CAPS: 20.0000 mg | ORAL_CAPSULE | Freq: Every day | ORAL | 3 refills | Status: DC

## 2023-12-07 DIAGNOSIS — Z7952 Long term (current) use of systemic steroids: Secondary | ICD-10-CM | POA: Diagnosis not present

## 2023-12-07 DIAGNOSIS — E538 Deficiency of other specified B group vitamins: Secondary | ICD-10-CM | POA: Diagnosis not present

## 2023-12-07 DIAGNOSIS — E7800 Pure hypercholesterolemia, unspecified: Secondary | ICD-10-CM | POA: Diagnosis not present

## 2023-12-07 DIAGNOSIS — G20A1 Parkinson's disease without dyskinesia, without mention of fluctuations: Secondary | ICD-10-CM | POA: Diagnosis not present

## 2023-12-07 DIAGNOSIS — F32A Depression, unspecified: Secondary | ICD-10-CM | POA: Diagnosis not present

## 2023-12-07 DIAGNOSIS — I951 Orthostatic hypotension: Secondary | ICD-10-CM | POA: Diagnosis not present

## 2023-12-08 ENCOUNTER — Ambulatory Visit: Payer: Medicare Other | Admitting: Family Medicine

## 2023-12-08 VITALS — BP 124/62 | HR 58 | Ht 66.0 in | Wt 149.0 lb

## 2023-12-08 DIAGNOSIS — D649 Anemia, unspecified: Secondary | ICD-10-CM | POA: Diagnosis not present

## 2023-12-08 DIAGNOSIS — G20A1 Parkinson's disease without dyskinesia, without mention of fluctuations: Secondary | ICD-10-CM

## 2023-12-08 MED ORDER — CARBIDOPA-LEVODOPA 25-100 MG PO TABS: 2.0000 | ORAL_TABLET | Freq: Three times a day (TID) | ORAL | 3 refills | Status: DC

## 2023-12-08 NOTE — Progress Notes (Signed)
 Subjective:    Patient ID: Alex Frazier, male    DOB: 19-May-1946, 78 y.o.   MRN: [id number]  HPI Patient is here today with his wife.  Biggest issue continues to be his lack of mobility.  The patient is unable to stand.  He will stand up and then freeze.  His wife then has to strain to help get him into the bed or in his wheelchair.  Takes 3-4 people to get him to doctor appointments.  When asked questions, the patient simply stares.  He is very slow to answer or follow any commands.  He is extremely rigid today on exam.  His Sinemet dose was decreased due to hallucinations to 25/100 3 times a day  Past Medical History:  Diagnosis Date   B12 deficiency    Borderline   Bulging discs    Degenerative disc disease, cervical    Gastritis    Hyperlipidemia    Neuropathy    Parkinson's disease (HCC)    Parkinsonian syndrome (HCC)    Peripheral neuropathy    TIA (transient ischemic attack)    TIA symptoms from hypotension   Past Surgical History:  Procedure Laterality Date   APPENDECTOMY     BACK SURGERY     Current Outpatient Medications on File Prior to Visit  Medication Sig Dispense Refill   acetaminophen (TYLENOL) 500 MG tablet Take 2 tablets (1,000 mg total) by mouth 3 (three) times daily. 30 tablet 0   Calcium Carb-Cholecalciferol (CALCIUM 600 + D PO) Take 1 tablet by mouth daily.     carbidopa-levodopa (SINEMET IR) 25-100 MG tablet Take 1 tablet by mouth 3 (three) times daily.     cephALEXin (KEFLEX) 500 MG capsule Take 1 capsule (500 mg total) by mouth 3 (three) times daily. 21 capsule 0   cloNIDine (CATAPRES) 0.1 MG tablet Take 1 tablet (0.1 mg total) by mouth every 8 (eight) hours as needed. Use as needed for systolic BP over 782. 30 tablet 3   Cyanocobalamin (VITAMIN B 12 PO) Take 1 tablet by mouth in the morning.      fludrocortisone (FLORINEF) 0.1 MG tablet TAKE 2 TABLETS BY MOUTH DAILY 180 tablet 0   FLUoxetine (PROZAC) 20 MG capsule Take 1 capsule (20 mg total) by mouth  daily. 90 capsule 3   midodrine (PROAMATINE) 10 MG tablet Take 1 tablet (10 mg total) by mouth 3 (three) times daily. 270 tablet 1   polyethylene glycol powder (GLYCOLAX/MIRALAX) 17 GM/SCOOP powder Take 17 g by mouth See admin instructions. Mix 17 grams of powder into 4-8 ounces of prune juice and drink once a day     pravastatin (PRAVACHOL) 20 MG tablet TAKE 1 TABLET BY MOUTH DAILY AT 6PM GENERIC EQUIVALENT FOR PRAVACHOL 90 tablet 3   pravastatin (PRAVACHOL) 20 MG tablet TAKE 1 TABLET BY MOUTH DAILY AT 6PM GENERIC EQUIVALENT FOR PRAVACHOL 90 tablet 3   senna-docusate (SENOKOT-S) 8.6-50 MG tablet Take 2 tablets by mouth at bedtime as needed for mild constipation. 60 tablet 2   No current facility-administered medications on file prior to visit.   Allergies  Allergen Reactions   Tape Other (See Comments)    SKIN IS VERY THIN- TEARS VERY EASILY!!   Hydrocodone-Acetaminophen Other (See Comments)    Hallucinations and confusion   Indomethacin Other (See Comments)    Unknown reaction   Lyrica [Pregabalin] Other (See Comments)    Vivid hallucinations   Nsaids Other (See Comments)    Per family patient gets  hallucination/confusion.   Quetiapine Other (See Comments)    HYPOtension!!   Social History   Socioeconomic History   Marital status: Married    Spouse name: Not on file   Number of children: 3   Years of education: Not on file   Highest education level: High school graduate  Occupational History   Not on file  Tobacco Use   Smoking status: Never   Smokeless tobacco: Never  Vaping Use   Vaping status: Never Used  Substance and Sexual Activity   Alcohol use: No   Drug use: No   Sexual activity: Not Currently  Other Topics Concern   Not on file  Social History Narrative   Lives at home with his wife   Right handed   Caffeine: none   Social Drivers of Corporate investment banker Strain: Low Risk  (08/27/2023)   Overall Financial Resource Strain (CARDIA)    Difficulty  of Paying Living Expenses: Not hard at all  Food Insecurity: No Food Insecurity (08/27/2023)   Hunger Vital Sign    Worried About Running Out of Food in the Last Year: Never true    Ran Out of Food in the Last Year: Never true  Transportation Needs: No Transportation Needs (08/27/2023)   PRAPARE - Administrator, Civil Service (Medical): No    Lack of Transportation (Non-Medical): No  Physical Activity: Inactive (08/27/2023)   Exercise Vital Sign    Days of Exercise per Week: 0 days    Minutes of Exercise per Session: 0 min  Stress: No Stress Concern Present (08/27/2023)   [employer] of Occupational Health - Occupational Stress Questionnaire    Feeling of Stress : Not at all  Social Connections: Moderately Isolated (08/27/2023)   Social Connection and Isolation Panel [NHANES]    Frequency of Communication with Friends and Family: Twice a week    Frequency of Social Gatherings with Friends and Family: Twice a week    Attends Religious Services: Never    Database administrator or Organizations: No    Attends Banker Meetings: Never    Marital Status: Married  Catering manager Violence: Not At Risk (08/27/2023)   Humiliation, Afraid, Rape, and Kick questionnaire    Fear of Current or Ex-Partner: No    Emotionally Abused: No    Physically Abused: No    Sexually Abused: No     Review of Systems  All other systems reviewed and are negative.      Objective:   Physical Exam Constitutional:      Appearance: Normal appearance. He is well-groomed and normal weight. He is not ill-appearing or toxic-appearing.  HENT:     Mouth/Throat:     Mouth: Mucous membranes are moist.     Pharynx: No oropharyngeal exudate or posterior oropharyngeal erythema.  Eyes:     Extraocular Movements: Extraocular movements intact.     Pupils: Pupils are equal, round, and reactive to light.  Cardiovascular:     Rate and Rhythm: Normal rate and regular rhythm.     Heart  sounds: Normal heart sounds.  Pulmonary:     Effort: Pulmonary effort is normal. No respiratory distress.     Breath sounds: Normal breath sounds. No stridor. No wheezing, rhonchi or rales.  Chest:     Chest wall: No tenderness.  Abdominal:     General: Abdomen is flat. Bowel sounds are normal. There is no distension.     Palpations: Abdomen is soft.  Tenderness: There is no abdominal tenderness. There is no guarding.     Hernia: No hernia is present.  Musculoskeletal:        General: No tenderness.     Right lower leg: No edema.     Left lower leg: No edema.  Skin:    Findings: Rash present. No erythema.  Neurological:     Mental Status: He is alert and oriented to person, place, and time. Mental status is at baseline.     Motor: Weakness present.     Coordination: Coordination abnormal.     Gait: Gait abnormal.  Psychiatric:        Attention and Perception: Attention normal.        Mood and Affect: Mood is depressed. Affect is tearful.        Speech: Speech is delayed.        Behavior: Behavior is slowed and withdrawn.        Cognition and Memory: Memory is impaired.     Assessment & Plan:  Parkinson's disease, unspecified whether dyskinesia present, unspecified whether manifestations fluctuate (HCC) - Plan: CBC with Differential/Platelet, COMPLETE METABOLIC PANEL WITHOUT GFR Patient has advanced Parkinson's disease.  He demonstrates severe bradykinesia and rigidity.  Recommended trying to increase Sinemet to 1-1/2 tablets 3 times a day.  In 1 month if there is any benefit at all I would increase to 2 tablets 3 times a day.  Monitor for hallucinations and hypotension.  Check CBC and CMP.

## 2023-12-09 DIAGNOSIS — I951 Orthostatic hypotension: Secondary | ICD-10-CM | POA: Diagnosis not present

## 2023-12-09 DIAGNOSIS — E7800 Pure hypercholesterolemia, unspecified: Secondary | ICD-10-CM | POA: Diagnosis not present

## 2023-12-09 DIAGNOSIS — G20A1 Parkinson's disease without dyskinesia, without mention of fluctuations: Secondary | ICD-10-CM | POA: Diagnosis not present

## 2023-12-09 DIAGNOSIS — E538 Deficiency of other specified B group vitamins: Secondary | ICD-10-CM | POA: Diagnosis not present

## 2023-12-09 DIAGNOSIS — Z7952 Long term (current) use of systemic steroids: Secondary | ICD-10-CM | POA: Diagnosis not present

## 2023-12-09 DIAGNOSIS — F32A Depression, unspecified: Secondary | ICD-10-CM | POA: Diagnosis not present

## 2023-12-10 ENCOUNTER — Other Ambulatory Visit: Payer: Self-pay

## 2023-12-12 LAB — CBC WITH DIFFERENTIAL/PLATELET
Absolute Lymphocytes: 864 {cells}/uL (ref 850–3900)
Absolute Monocytes: 816 {cells}/uL (ref 200–950)
Basophils Absolute: 41 {cells}/uL (ref 0–200)
Basophils Relative: 0.6 %
Eosinophils Absolute: 48 {cells}/uL (ref 15–500)
Eosinophils Relative: 0.7 %
HCT: 36.3 % — ABNORMAL LOW (ref 38.5–50.0)
Hemoglobin: 11.8 g/dL — ABNORMAL LOW (ref 13.2–17.1)
MCH: 28.4 pg (ref 27.0–33.0)
MCHC: 32.5 g/dL (ref 32.0–36.0)
MCV: 87.5 fL (ref 80.0–100.0)
MPV: 10.2 fL (ref 7.5–12.5)
Monocytes Relative: 12 %
Neutro Abs: 5032 {cells}/uL (ref 1500–7800)
Neutrophils Relative %: 74 %
Platelets: 265 10*3/uL (ref 140–400)
RBC: 4.15 10*6/uL — ABNORMAL LOW (ref 4.20–5.80)
RDW: 13 % (ref 11.0–15.0)
Total Lymphocyte: 12.7 %
WBC: 6.8 10*3/uL (ref 3.8–10.8)

## 2023-12-12 LAB — COMPLETE METABOLIC PANEL WITHOUT GFR
AG Ratio: 1.5 (calc) (ref 1.0–2.5)
ALT: 11 U/L (ref 9–46)
AST: 15 U/L (ref 10–35)
Albumin: 3.6 g/dL (ref 3.6–5.1)
Alkaline phosphatase (APISO): 128 U/L (ref 35–144)
BUN: 21 mg/dL (ref 7–25)
CO2: 28 mmol/L (ref 20–32)
Calcium: 8.7 mg/dL (ref 8.6–10.3)
Chloride: 104 mmol/L (ref 98–110)
Creat: 0.73 mg/dL (ref 0.70–1.28)
Globulin: 2.4 g/dL (ref 1.9–3.7)
Glucose, Bld: 103 mg/dL — ABNORMAL HIGH (ref 65–99)
Potassium: 4.1 mmol/L (ref 3.5–5.3)
Sodium: 139 mmol/L (ref 135–146)
Total Bilirubin: 0.6 mg/dL (ref 0.2–1.2)
Total Protein: 6 g/dL — ABNORMAL LOW (ref 6.1–8.1)

## 2023-12-12 LAB — IRON,TIBC AND FERRITIN PANEL
%SAT: 13 % — ABNORMAL LOW (ref 20–48)
Ferritin: 38 ng/mL (ref 24–380)
Iron: 32 ug/dL — ABNORMAL LOW (ref 50–180)
TIBC: 251 ug/dL (ref 250–425)

## 2023-12-12 LAB — B12 AND FOLATE PANEL
Folate: 5.6 ng/mL
Vitamin B-12: >2000 pg/mL — ABNORMAL HIGH (ref 200–1100)

## 2023-12-12 LAB — TEST AUTHORIZATION

## 2023-12-14 DIAGNOSIS — F32A Depression, unspecified: Secondary | ICD-10-CM | POA: Diagnosis not present

## 2023-12-14 DIAGNOSIS — I951 Orthostatic hypotension: Secondary | ICD-10-CM | POA: Diagnosis not present

## 2023-12-14 DIAGNOSIS — E7800 Pure hypercholesterolemia, unspecified: Secondary | ICD-10-CM | POA: Diagnosis not present

## 2023-12-14 DIAGNOSIS — E538 Deficiency of other specified B group vitamins: Secondary | ICD-10-CM | POA: Diagnosis not present

## 2023-12-14 DIAGNOSIS — G20A1 Parkinson's disease without dyskinesia, without mention of fluctuations: Secondary | ICD-10-CM | POA: Diagnosis not present

## 2023-12-14 DIAGNOSIS — Z7952 Long term (current) use of systemic steroids: Secondary | ICD-10-CM | POA: Diagnosis not present

## 2023-12-16 DIAGNOSIS — F32A Depression, unspecified: Secondary | ICD-10-CM | POA: Diagnosis not present

## 2023-12-16 DIAGNOSIS — E7800 Pure hypercholesterolemia, unspecified: Secondary | ICD-10-CM | POA: Diagnosis not present

## 2023-12-16 DIAGNOSIS — Z7952 Long term (current) use of systemic steroids: Secondary | ICD-10-CM | POA: Diagnosis not present

## 2023-12-16 DIAGNOSIS — I951 Orthostatic hypotension: Secondary | ICD-10-CM | POA: Diagnosis not present

## 2023-12-16 DIAGNOSIS — E538 Deficiency of other specified B group vitamins: Secondary | ICD-10-CM | POA: Diagnosis not present

## 2023-12-16 DIAGNOSIS — G20A1 Parkinson's disease without dyskinesia, without mention of fluctuations: Secondary | ICD-10-CM | POA: Diagnosis not present

## 2023-12-17 ENCOUNTER — Telehealth: Payer: Self-pay

## 2023-12-17 NOTE — Telephone Encounter (Signed)
 Copied from CRM [id number]. Topic: Clinical - Home Health Verbal Orders >> Dec 16, 2023  5:02 PM DeAngela L wrote: Caller/Agency: Darlene Occupational therapist with [facility]  Callback Number: [phone] called with medication concerns  Frequency: the patient is taking one tablet by mouth daily this is an OTC medication prescribed by the dr Any new concerns about the patient? Yes Patient started the iron supplement yesterday Iron 65mg  it has an interaction of a severe warning number 2 cardidopa levodopa the occupational therapist is concerned with this warning for the Patient

## 2023-12-18 ENCOUNTER — Other Ambulatory Visit

## 2023-12-18 DIAGNOSIS — D509 Iron deficiency anemia, unspecified: Secondary | ICD-10-CM | POA: Diagnosis not present

## 2023-12-19 LAB — FECAL GLOBIN BY IMMUNOCHEMISTRY
FECAL GLOBIN RESULT:: NOT DETECTED
MICRO NUMBER:: 16319493
SPECIMEN QUALITY:: ADEQUATE

## 2023-12-24 DIAGNOSIS — I951 Orthostatic hypotension: Secondary | ICD-10-CM | POA: Diagnosis not present

## 2023-12-24 DIAGNOSIS — Z7952 Long term (current) use of systemic steroids: Secondary | ICD-10-CM | POA: Diagnosis not present

## 2023-12-24 DIAGNOSIS — E538 Deficiency of other specified B group vitamins: Secondary | ICD-10-CM | POA: Diagnosis not present

## 2023-12-24 DIAGNOSIS — G20A1 Parkinson's disease without dyskinesia, without mention of fluctuations: Secondary | ICD-10-CM | POA: Diagnosis not present

## 2023-12-24 DIAGNOSIS — E7800 Pure hypercholesterolemia, unspecified: Secondary | ICD-10-CM | POA: Diagnosis not present

## 2023-12-24 DIAGNOSIS — F32A Depression, unspecified: Secondary | ICD-10-CM | POA: Diagnosis not present

## 2023-12-25 DIAGNOSIS — G20A1 Parkinson's disease without dyskinesia, without mention of fluctuations: Secondary | ICD-10-CM | POA: Diagnosis not present

## 2023-12-25 DIAGNOSIS — F32A Depression, unspecified: Secondary | ICD-10-CM | POA: Diagnosis not present

## 2023-12-25 DIAGNOSIS — E7800 Pure hypercholesterolemia, unspecified: Secondary | ICD-10-CM | POA: Diagnosis not present

## 2023-12-25 DIAGNOSIS — Z7952 Long term (current) use of systemic steroids: Secondary | ICD-10-CM | POA: Diagnosis not present

## 2023-12-25 DIAGNOSIS — I951 Orthostatic hypotension: Secondary | ICD-10-CM | POA: Diagnosis not present

## 2023-12-25 DIAGNOSIS — E538 Deficiency of other specified B group vitamins: Secondary | ICD-10-CM | POA: Diagnosis not present

## 2023-12-28 ENCOUNTER — Encounter: Payer: Self-pay | Admitting: Family Medicine

## 2023-12-28 DIAGNOSIS — G20A1 Parkinson's disease without dyskinesia, without mention of fluctuations: Secondary | ICD-10-CM | POA: Insufficient documentation

## 2023-12-28 DIAGNOSIS — F0280 Dementia in other diseases classified elsewhere without behavioral disturbance: Secondary | ICD-10-CM | POA: Insufficient documentation

## 2023-12-29 ENCOUNTER — Other Ambulatory Visit: Payer: Self-pay | Admitting: *Deleted

## 2023-12-29 ENCOUNTER — Other Ambulatory Visit: Payer: Self-pay

## 2023-12-29 DIAGNOSIS — Z556 Problems related to health literacy: Secondary | ICD-10-CM | POA: Diagnosis not present

## 2023-12-29 DIAGNOSIS — G629 Polyneuropathy, unspecified: Secondary | ICD-10-CM | POA: Diagnosis not present

## 2023-12-29 DIAGNOSIS — Z8673 Personal history of transient ischemic attack (TIA), and cerebral infarction without residual deficits: Secondary | ICD-10-CM | POA: Diagnosis not present

## 2023-12-29 DIAGNOSIS — Z87440 Personal history of urinary (tract) infections: Secondary | ICD-10-CM | POA: Diagnosis not present

## 2023-12-29 DIAGNOSIS — M5030 Other cervical disc degeneration, unspecified cervical region: Secondary | ICD-10-CM | POA: Diagnosis not present

## 2023-12-29 DIAGNOSIS — F32A Depression, unspecified: Secondary | ICD-10-CM | POA: Diagnosis not present

## 2023-12-29 DIAGNOSIS — E7800 Pure hypercholesterolemia, unspecified: Secondary | ICD-10-CM | POA: Diagnosis not present

## 2023-12-29 DIAGNOSIS — I951 Orthostatic hypotension: Secondary | ICD-10-CM | POA: Diagnosis not present

## 2023-12-29 DIAGNOSIS — Z7952 Long term (current) use of systemic steroids: Secondary | ICD-10-CM | POA: Diagnosis not present

## 2023-12-29 DIAGNOSIS — E538 Deficiency of other specified B group vitamins: Secondary | ICD-10-CM | POA: Diagnosis not present

## 2023-12-29 DIAGNOSIS — Z9049 Acquired absence of other specified parts of digestive tract: Secondary | ICD-10-CM | POA: Diagnosis not present

## 2023-12-29 DIAGNOSIS — Z9181 History of falling: Secondary | ICD-10-CM | POA: Diagnosis not present

## 2023-12-29 DIAGNOSIS — Z8616 Personal history of COVID-19: Secondary | ICD-10-CM | POA: Diagnosis not present

## 2023-12-29 DIAGNOSIS — G20A1 Parkinson's disease without dyskinesia, without mention of fluctuations: Secondary | ICD-10-CM | POA: Diagnosis not present

## 2023-12-29 NOTE — Patient Outreach (Signed)
 Complex Care Management   Visit Note  12/29/2023  Name:  Alex Frazier MRN: [id number] DOB: 09-02-1946  Situation: Referral received for Complex Care Management related to  Parkinson's  I obtained verbal consent from Caregiver.  Visit completed with Spouse  on the phone  Background:   Past Medical History:  Diagnosis Date   B12 deficiency    Borderline   Bulging discs    Degenerative disc disease, cervical    Dementia associated with Parkinson disease (HCC)    Gastritis    Hyperlipidemia    Neuropathy    Parkinson's disease (HCC)    Parkinsonian syndrome (HCC)    Peripheral neuropathy    TIA (transient ischemic attack)    TIA symptoms from hypotension    Assessment: Patient Reported Symptoms:  Cognitive Cognitive Status: Requires Assistance Decision Making Cognitive/Intellectual Conditions Management [RPT]: Other Other: Demenita?   Health Maintenance Behaviors: Annual physical exam Healing Pattern: Unsure Health Facilitated by: Rest  Neurological Neurological Review of Symptoms: No symptoms reported Neurological Conditions: Dementia Neurological Management Strategies: Routine screening Neurological Self-Management Outcome: 3 (uncertain)  HEENT HEENT Symptoms Reported: Change or loss of hearing HEENT Management Strategies: Routine screening HEENT Self-Management Outcome: 3 (uncertain)    Cardiovascular Cardiovascular Symptoms Reported: No symptoms reported Does patient have uncontrolled Hypertension?: No Cardiovascular Self-Management Outcome: 3 (uncertain)  Respiratory Respiratory Symptoms Reported: No symptoms reported Respiratory Self-Management Outcome: 4 (good)  Endocrine Patient reports the following symptoms related to hypoglycemia or hyperglycemia : No symptoms reported Endocrine Self-Management Outcome: 4 (good)  Gastrointestinal Gastrointestinal Symptoms Reported: No symptoms reported Gastrointestinal Self-Management Outcome: 4 (good) Nutrition Risk  Screen (CP): No indicators present  Genitourinary Genitourinary Symptoms Reported: No symptoms reported Genitourinary Self-Management Outcome: 4 (good)  Integumentary Integumentary Symptoms Reported: No symptoms reported Skin Self-Management Outcome: 4 (good)  Musculoskeletal Musculoskelatal Symptoms Reviewed: Unsteady gait, Weakness, Difficulty walking Musculoskeletal Conditions: Mobility limited, Unsteady gait Musculoskeletal Management Strategies: Medical device, Routine screening Musculoskeletal Self-Management Outcome: 3 (uncertain) Falls in the past year?: Yes Number of falls in past year: 1 or less Was there an injury with Fall?: No Fall Risk Category Calculator: 1 Patient Fall Risk Level: Low Fall Risk Patient at Risk for Falls Due to: History of fall(s) Fall risk Follow up: Falls evaluation completed, Education provided, Falls prevention discussed  Psychosocial Psychosocial Symptoms Reported: Not assessed     Quality of Family Relationships: involved, helpful, supportive Do you feel physically threatened by others?:  (unable to ask)      08/27/2023    2:40 PM  Depression screen PHQ 2/9  Decreased Interest 1  Down, Depressed, Hopeless 1  PHQ - 2 Score 2  Altered sleeping 0  Tired, decreased energy 0  Change in appetite 0  Feeling bad or failure about yourself  0  Trouble concentrating 0  Moving slowly or fidgety/restless 0  Suicidal thoughts 0  PHQ-9 Score 2  Difficult doing work/chores Not difficult at all    There were no vitals filed for this visit.  Medications Reviewed Today     Reviewed by Remona Carmel, RN (Registered Nurse) on 12/29/23 at 1428  Med List Status: <None>   Medication Order Taking? Sig Documenting Provider Last Dose Status Informant  acetaminophen  (TYLENOL ) 500 MG tablet [id number] Yes Take 2 tablets (1,000 mg total) by mouth 3 (three) times daily. Vada Garibaldi, MD Taking Active Spouse/Significant Other, Pharmacy Records  Calcium   Carb-Cholecalciferol  (CALCIUM  600 + D PO) [id number] Yes Take 1 tablet by mouth daily. [provider] Taking  Active   carbidopa -levodopa  (SINEMET  IR) 25-100 MG tablet [id number] Yes Take 2 tablets by mouth 3 (three) times daily. Austine Lefort, MD Taking Active            Med Note Oletta Berry, Ree Alcalde M   Tue Dec 29, 2023  2:22 PM) Patient differently. Taking 1.5 tablets 3 times daily.  cloNIDine  (CATAPRES ) 0.1 MG tablet [id number] Yes Take 1 tablet (0.1 mg total) by mouth every 8 (eight) hours as needed. Use as needed for systolic BP over 244. Austine Lefort, MD Taking Active   Cyanocobalamin (VITAMIN B 12 PO) [id number] Yes Take 1 tablet by mouth in the morning.  [provider] Taking Active Spouse/Significant Other, Pharmacy Records  ferrous sulfate 324 MG TBEC [id number] Yes Take 324 mg by mouth at bedtime. Austine Lefort, MD Taking Active   fludrocortisone  (FLORINEF ) 0.1 MG tablet [id number] Yes TAKE 2 TABLETS BY MOUTH DAILY Austine Lefort, MD Taking Active   FLUoxetine  (PROZAC ) 20 MG capsule [id number] Yes Take 1 capsule (20 mg total) by mouth daily. Austine Lefort, MD Taking Active   midodrine  (PROAMATINE ) 10 MG tablet [id number] Yes Take 1 tablet (10 mg total) by mouth 3 (three) times daily. Austine Lefort, MD Taking Active   polyethylene glycol powder (GLYCOLAX /MIRALAX ) 17 GM/SCOOP powder [id number] Yes Take 17 g by mouth See admin instructions. Mix 17 grams of powder into 4-8 ounces of prune juice and drink once a day [provider] Taking Active Spouse/Significant Other, Pharmacy Records  pravastatin  (PRAVACHOL ) 20 MG tablet [id number] Yes TAKE 1 TABLET BY MOUTH DAILY AT 6PM GENERIC EQUIVALENT FOR PRAVACHOL  Austine Lefort, MD Taking Active   senna-docusate (SENOKOT-S) 8.6-50 MG tablet [id number] Yes Take 2 tablets by mouth at bedtime as needed for mild constipation. Austine Lefort, MD Taking Active Spouse/Significant Other, Pharmacy Records             Recommendation:   PCP Follow-up  Follow Up Plan:   Telephone follow up appointment date/time:  01-28-2024 at 2:00 pm Grandville Lax, BSN RN [facility], [facility] RN Care Manager Direct Dial: [phone]  Fax: [phone]

## 2023-12-29 NOTE — Patient Instructions (Signed)
 Visit Information  Thank you for taking time to visit with me today. Please don't hesitate to contact me if I can be of assistance to you before our next scheduled appointment.  Your next care management appointment is by telephone on 01-28-2024 at 2:00 pm  Telephone follow-up in 1 month  Please call the care guide team at [phone] if you need to cancel, schedule, or reschedule an appointment.   Please call the Suicide and Crisis Lifeline: 988 call the [place]  National Suicide Prevention Lifeline: 484-473-7047 or TTY: 918-773-6263 TTY (332) 731-5100) to talk to a trained counselor call 1-800-273-TALK (toll free, 24 hour hotline) go to [facility] [address] [place] [phone]) call 911 if you are experiencing a Mental Health or Behavioral Health Crisis or need someone to talk to.  Grandville Lax, BSN RN [place]  [facility], [facility] Health RN Care Manager Direct Dial: [phone]  Fax: [phone]  Palliative Care Palliative care can help make your quality of life better if you have a very serious illness. It involves care of your body, mind, and spirit. It's based on what you need and want in this stage of life. In many cases, care may take place in a hospital or long-term care setting. It may include: Help with pain and other symptoms. Family support. Spiritual support. Emotional support. Social support. Palliative care can help bring you comfort and peace of mind. It can be helpful to you and your family during the course of an illness. What is the difference between palliative care and hospice? Palliative care and hospice have similar goals. They both aim to: Help with symptoms. Provide comfort. Make your quality of life better. Maintain your dignity. They're different in that palliative care can be given: At the same time as other treatments. During any phase of a serious illness, from diagnosis to  cure. Hospice care is given when: You're thought to have 6 months or less to live. You no longer can, or want to, try to find a cure for your illness. Who can get palliative care services? Palliative care is given to children and adults who are very ill. It may be offered if: You're not responding well to treatment. You need help with pain. You have side effects from treatment that are hard to manage. You have symptoms from the effects of surgery. You have been diagnosed with a disease that: Is advanced. Will shorten your life. Your health care provider may talk with you about palliative care if they think the support would help you. Your family and friends may also get help to manage stress and other concerns. Who makes up the palliative care team? The care team includes: You and your family. Your providers and specialists. Nurses. A Child psychotherapist, psychologist, or psychiatrist. Based on your needs, the team may also include: A pain specialist. A hospice specialist. Someone to help with finances or insurance. Religious or spiritual leaders. A case Production designer, theatre/television/film. A grief counselor. How will the palliative care team help me and my family? The team will speak with you and your family about: Your symptoms, such as: Pain. Nausea. Vomiting. Shortness of breath. The need for specialists. Advance directives. These may include: Living wills. Health care proxies. Symptoms that have to do with your mental health, such as: Stress. Depression. This is when you feel sad or hopeless. Anxiety. This is when you feel worried or nervous. Treatment options and how to keep as much as possible of your: Function. Ability to  move around. Spiritual wishes, such as: Rituals. Prayer. Things you can do to leave a legacy or make memories. Life and death as a normal process. End-of-life care. The team can also help you talk about: Hard issues. Spiritual concerns. Emotional concerns. Where to find  more information [employer] on Aging (NIA): [email or web address].pl National Hospice and Palliative Care Organization [facility]): [email or web address] This information is not intended to replace advice given to you by your health care provider. Make sure you discuss any questions you have with your health care provider. Document Revised: 01/12/2023 Document Reviewed: 01/12/2023 Elsevier Patient Education  2024 [employer].

## 2023-12-30 DIAGNOSIS — G629 Polyneuropathy, unspecified: Secondary | ICD-10-CM | POA: Diagnosis not present

## 2023-12-30 DIAGNOSIS — I951 Orthostatic hypotension: Secondary | ICD-10-CM | POA: Diagnosis not present

## 2023-12-30 DIAGNOSIS — E538 Deficiency of other specified B group vitamins: Secondary | ICD-10-CM | POA: Diagnosis not present

## 2023-12-30 DIAGNOSIS — F32A Depression, unspecified: Secondary | ICD-10-CM | POA: Diagnosis not present

## 2023-12-30 DIAGNOSIS — E7800 Pure hypercholesterolemia, unspecified: Secondary | ICD-10-CM | POA: Diagnosis not present

## 2023-12-30 DIAGNOSIS — G20A1 Parkinson's disease without dyskinesia, without mention of fluctuations: Secondary | ICD-10-CM | POA: Diagnosis not present

## 2024-01-01 DIAGNOSIS — G629 Polyneuropathy, unspecified: Secondary | ICD-10-CM | POA: Diagnosis not present

## 2024-01-01 DIAGNOSIS — F32A Depression, unspecified: Secondary | ICD-10-CM | POA: Diagnosis not present

## 2024-01-01 DIAGNOSIS — E538 Deficiency of other specified B group vitamins: Secondary | ICD-10-CM | POA: Diagnosis not present

## 2024-01-01 DIAGNOSIS — G20A1 Parkinson's disease without dyskinesia, without mention of fluctuations: Secondary | ICD-10-CM | POA: Diagnosis not present

## 2024-01-01 DIAGNOSIS — E7800 Pure hypercholesterolemia, unspecified: Secondary | ICD-10-CM | POA: Diagnosis not present

## 2024-01-01 DIAGNOSIS — I951 Orthostatic hypotension: Secondary | ICD-10-CM | POA: Diagnosis not present

## 2024-01-06 ENCOUNTER — Telehealth: Payer: Self-pay

## 2024-01-06 DIAGNOSIS — I951 Orthostatic hypotension: Secondary | ICD-10-CM | POA: Diagnosis not present

## 2024-01-06 DIAGNOSIS — E7800 Pure hypercholesterolemia, unspecified: Secondary | ICD-10-CM | POA: Diagnosis not present

## 2024-01-06 DIAGNOSIS — F32A Depression, unspecified: Secondary | ICD-10-CM | POA: Diagnosis not present

## 2024-01-06 DIAGNOSIS — G629 Polyneuropathy, unspecified: Secondary | ICD-10-CM | POA: Diagnosis not present

## 2024-01-06 DIAGNOSIS — E538 Deficiency of other specified B group vitamins: Secondary | ICD-10-CM | POA: Diagnosis not present

## 2024-01-06 DIAGNOSIS — G20A1 Parkinson's disease without dyskinesia, without mention of fluctuations: Secondary | ICD-10-CM | POA: Diagnosis not present

## 2024-01-06 NOTE — Telephone Encounter (Signed)
 Spoke with Alicia at [facility] and verbal order given for skilled nursing care due to irregular blood pressures per Dr. Monty App order. Verbal order also given to draw labs on 01/14/24 to recheck hemoglobin per Dr Monty App order. Pt's wife, Tanya Fantasia advised. Mjp,lpn

## 2024-01-06 NOTE — Telephone Encounter (Signed)
 Verbal order for CBC with Diff given to Alicia with [facility]. Mjp,lpn  Copied from CRM [id number]. Topic: General - Other >> Jan 06, 2024  1:42 PM Emylou G wrote: Reason for CRM: Marlis Simper w/[facility] [phone] Rcvd verbal orders today but forgot to ask what labs needed to be ordered for bloodwork

## 2024-01-08 DIAGNOSIS — F32A Depression, unspecified: Secondary | ICD-10-CM | POA: Diagnosis not present

## 2024-01-08 DIAGNOSIS — I951 Orthostatic hypotension: Secondary | ICD-10-CM | POA: Diagnosis not present

## 2024-01-08 DIAGNOSIS — G20A1 Parkinson's disease without dyskinesia, without mention of fluctuations: Secondary | ICD-10-CM | POA: Diagnosis not present

## 2024-01-08 DIAGNOSIS — G629 Polyneuropathy, unspecified: Secondary | ICD-10-CM | POA: Diagnosis not present

## 2024-01-08 DIAGNOSIS — E538 Deficiency of other specified B group vitamins: Secondary | ICD-10-CM | POA: Diagnosis not present

## 2024-01-08 DIAGNOSIS — E7800 Pure hypercholesterolemia, unspecified: Secondary | ICD-10-CM | POA: Diagnosis not present

## 2024-01-11 DIAGNOSIS — F32A Depression, unspecified: Secondary | ICD-10-CM | POA: Diagnosis not present

## 2024-01-11 DIAGNOSIS — E7800 Pure hypercholesterolemia, unspecified: Secondary | ICD-10-CM | POA: Diagnosis not present

## 2024-01-11 DIAGNOSIS — G20A1 Parkinson's disease without dyskinesia, without mention of fluctuations: Secondary | ICD-10-CM | POA: Diagnosis not present

## 2024-01-11 DIAGNOSIS — G629 Polyneuropathy, unspecified: Secondary | ICD-10-CM | POA: Diagnosis not present

## 2024-01-11 DIAGNOSIS — E538 Deficiency of other specified B group vitamins: Secondary | ICD-10-CM | POA: Diagnosis not present

## 2024-01-11 DIAGNOSIS — I951 Orthostatic hypotension: Secondary | ICD-10-CM | POA: Diagnosis not present

## 2024-01-12 DIAGNOSIS — I951 Orthostatic hypotension: Secondary | ICD-10-CM | POA: Diagnosis not present

## 2024-01-12 DIAGNOSIS — E7800 Pure hypercholesterolemia, unspecified: Secondary | ICD-10-CM | POA: Diagnosis not present

## 2024-01-12 DIAGNOSIS — G629 Polyneuropathy, unspecified: Secondary | ICD-10-CM | POA: Diagnosis not present

## 2024-01-12 DIAGNOSIS — F02B0 Dementia in other diseases classified elsewhere, moderate, without behavioral disturbance, psychotic disturbance, mood disturbance, and anxiety: Secondary | ICD-10-CM | POA: Diagnosis not present

## 2024-01-12 DIAGNOSIS — G20A1 Parkinson's disease without dyskinesia, without mention of fluctuations: Secondary | ICD-10-CM | POA: Diagnosis not present

## 2024-01-12 DIAGNOSIS — E538 Deficiency of other specified B group vitamins: Secondary | ICD-10-CM | POA: Diagnosis not present

## 2024-01-12 DIAGNOSIS — F32A Depression, unspecified: Secondary | ICD-10-CM | POA: Diagnosis not present

## 2024-01-13 DIAGNOSIS — G20A1 Parkinson's disease without dyskinesia, without mention of fluctuations: Secondary | ICD-10-CM | POA: Diagnosis not present

## 2024-01-13 DIAGNOSIS — E7800 Pure hypercholesterolemia, unspecified: Secondary | ICD-10-CM | POA: Diagnosis not present

## 2024-01-13 DIAGNOSIS — I951 Orthostatic hypotension: Secondary | ICD-10-CM | POA: Diagnosis not present

## 2024-01-13 DIAGNOSIS — F32A Depression, unspecified: Secondary | ICD-10-CM | POA: Diagnosis not present

## 2024-01-13 DIAGNOSIS — E538 Deficiency of other specified B group vitamins: Secondary | ICD-10-CM | POA: Diagnosis not present

## 2024-01-13 DIAGNOSIS — G629 Polyneuropathy, unspecified: Secondary | ICD-10-CM | POA: Diagnosis not present

## 2024-01-14 DIAGNOSIS — F32A Depression, unspecified: Secondary | ICD-10-CM | POA: Diagnosis not present

## 2024-01-14 DIAGNOSIS — G629 Polyneuropathy, unspecified: Secondary | ICD-10-CM | POA: Diagnosis not present

## 2024-01-14 DIAGNOSIS — E7800 Pure hypercholesterolemia, unspecified: Secondary | ICD-10-CM | POA: Diagnosis not present

## 2024-01-14 DIAGNOSIS — I951 Orthostatic hypotension: Secondary | ICD-10-CM | POA: Diagnosis not present

## 2024-01-14 DIAGNOSIS — G20A1 Parkinson's disease without dyskinesia, without mention of fluctuations: Secondary | ICD-10-CM | POA: Diagnosis not present

## 2024-01-14 DIAGNOSIS — E538 Deficiency of other specified B group vitamins: Secondary | ICD-10-CM | POA: Diagnosis not present

## 2024-01-15 DIAGNOSIS — G629 Polyneuropathy, unspecified: Secondary | ICD-10-CM | POA: Diagnosis not present

## 2024-01-15 DIAGNOSIS — E7800 Pure hypercholesterolemia, unspecified: Secondary | ICD-10-CM | POA: Diagnosis not present

## 2024-01-15 DIAGNOSIS — F32A Depression, unspecified: Secondary | ICD-10-CM | POA: Diagnosis not present

## 2024-01-15 DIAGNOSIS — G20A1 Parkinson's disease without dyskinesia, without mention of fluctuations: Secondary | ICD-10-CM | POA: Diagnosis not present

## 2024-01-15 DIAGNOSIS — E538 Deficiency of other specified B group vitamins: Secondary | ICD-10-CM | POA: Diagnosis not present

## 2024-01-15 DIAGNOSIS — I951 Orthostatic hypotension: Secondary | ICD-10-CM | POA: Diagnosis not present

## 2024-01-19 DIAGNOSIS — F32A Depression, unspecified: Secondary | ICD-10-CM | POA: Diagnosis not present

## 2024-01-19 DIAGNOSIS — G20A1 Parkinson's disease without dyskinesia, without mention of fluctuations: Secondary | ICD-10-CM | POA: Diagnosis not present

## 2024-01-19 DIAGNOSIS — G629 Polyneuropathy, unspecified: Secondary | ICD-10-CM | POA: Diagnosis not present

## 2024-01-19 DIAGNOSIS — I951 Orthostatic hypotension: Secondary | ICD-10-CM | POA: Diagnosis not present

## 2024-01-19 DIAGNOSIS — E538 Deficiency of other specified B group vitamins: Secondary | ICD-10-CM | POA: Diagnosis not present

## 2024-01-19 DIAGNOSIS — E7800 Pure hypercholesterolemia, unspecified: Secondary | ICD-10-CM | POA: Diagnosis not present

## 2024-01-20 DIAGNOSIS — E7800 Pure hypercholesterolemia, unspecified: Secondary | ICD-10-CM | POA: Diagnosis not present

## 2024-01-20 DIAGNOSIS — G20A1 Parkinson's disease without dyskinesia, without mention of fluctuations: Secondary | ICD-10-CM | POA: Diagnosis not present

## 2024-01-20 DIAGNOSIS — F32A Depression, unspecified: Secondary | ICD-10-CM | POA: Diagnosis not present

## 2024-01-20 DIAGNOSIS — I951 Orthostatic hypotension: Secondary | ICD-10-CM | POA: Diagnosis not present

## 2024-01-20 DIAGNOSIS — E538 Deficiency of other specified B group vitamins: Secondary | ICD-10-CM | POA: Diagnosis not present

## 2024-01-20 DIAGNOSIS — G629 Polyneuropathy, unspecified: Secondary | ICD-10-CM | POA: Diagnosis not present

## 2024-01-21 ENCOUNTER — Telehealth: Payer: Self-pay

## 2024-01-21 NOTE — Telephone Encounter (Signed)
 My Chart message sent to pt's wife to advise of lab results.  Hi Alex Frazier,   Dr. Cheril Cork did get Alex Frazier results and states that his Hemoglobin is much better and back to normal on vitamin.  I would say he would want him to continue the iron. Let us  know if you have any questions. Thank you!  Laraine Plate

## 2024-01-22 DIAGNOSIS — E538 Deficiency of other specified B group vitamins: Secondary | ICD-10-CM | POA: Diagnosis not present

## 2024-01-22 DIAGNOSIS — F32A Depression, unspecified: Secondary | ICD-10-CM | POA: Diagnosis not present

## 2024-01-22 DIAGNOSIS — E7800 Pure hypercholesterolemia, unspecified: Secondary | ICD-10-CM | POA: Diagnosis not present

## 2024-01-22 DIAGNOSIS — G20A1 Parkinson's disease without dyskinesia, without mention of fluctuations: Secondary | ICD-10-CM | POA: Diagnosis not present

## 2024-01-22 DIAGNOSIS — I951 Orthostatic hypotension: Secondary | ICD-10-CM | POA: Diagnosis not present

## 2024-01-22 DIAGNOSIS — G629 Polyneuropathy, unspecified: Secondary | ICD-10-CM | POA: Diagnosis not present

## 2024-01-26 DIAGNOSIS — F32A Depression, unspecified: Secondary | ICD-10-CM | POA: Diagnosis not present

## 2024-01-26 DIAGNOSIS — E538 Deficiency of other specified B group vitamins: Secondary | ICD-10-CM | POA: Diagnosis not present

## 2024-01-26 DIAGNOSIS — I951 Orthostatic hypotension: Secondary | ICD-10-CM | POA: Diagnosis not present

## 2024-01-26 DIAGNOSIS — G20A1 Parkinson's disease without dyskinesia, without mention of fluctuations: Secondary | ICD-10-CM | POA: Diagnosis not present

## 2024-01-26 DIAGNOSIS — G629 Polyneuropathy, unspecified: Secondary | ICD-10-CM | POA: Diagnosis not present

## 2024-01-26 DIAGNOSIS — E7800 Pure hypercholesterolemia, unspecified: Secondary | ICD-10-CM | POA: Diagnosis not present

## 2024-01-27 DIAGNOSIS — G629 Polyneuropathy, unspecified: Secondary | ICD-10-CM | POA: Diagnosis not present

## 2024-01-27 DIAGNOSIS — E7800 Pure hypercholesterolemia, unspecified: Secondary | ICD-10-CM | POA: Diagnosis not present

## 2024-01-27 DIAGNOSIS — F32A Depression, unspecified: Secondary | ICD-10-CM | POA: Diagnosis not present

## 2024-01-27 DIAGNOSIS — G20A1 Parkinson's disease without dyskinesia, without mention of fluctuations: Secondary | ICD-10-CM | POA: Diagnosis not present

## 2024-01-27 DIAGNOSIS — E538 Deficiency of other specified B group vitamins: Secondary | ICD-10-CM | POA: Diagnosis not present

## 2024-01-27 DIAGNOSIS — I951 Orthostatic hypotension: Secondary | ICD-10-CM | POA: Diagnosis not present

## 2024-01-28 ENCOUNTER — Other Ambulatory Visit: Payer: Self-pay

## 2024-01-28 DIAGNOSIS — Z9049 Acquired absence of other specified parts of digestive tract: Secondary | ICD-10-CM | POA: Diagnosis not present

## 2024-01-28 DIAGNOSIS — I951 Orthostatic hypotension: Secondary | ICD-10-CM | POA: Diagnosis not present

## 2024-01-28 DIAGNOSIS — E7800 Pure hypercholesterolemia, unspecified: Secondary | ICD-10-CM | POA: Diagnosis not present

## 2024-01-28 DIAGNOSIS — Z7952 Long term (current) use of systemic steroids: Secondary | ICD-10-CM | POA: Diagnosis not present

## 2024-01-28 DIAGNOSIS — Z87440 Personal history of urinary (tract) infections: Secondary | ICD-10-CM | POA: Diagnosis not present

## 2024-01-28 DIAGNOSIS — E538 Deficiency of other specified B group vitamins: Secondary | ICD-10-CM | POA: Diagnosis not present

## 2024-01-28 DIAGNOSIS — F32A Depression, unspecified: Secondary | ICD-10-CM | POA: Diagnosis not present

## 2024-01-28 DIAGNOSIS — M5030 Other cervical disc degeneration, unspecified cervical region: Secondary | ICD-10-CM | POA: Diagnosis not present

## 2024-01-28 DIAGNOSIS — Z8616 Personal history of COVID-19: Secondary | ICD-10-CM | POA: Diagnosis not present

## 2024-01-28 DIAGNOSIS — Z556 Problems related to health literacy: Secondary | ICD-10-CM | POA: Diagnosis not present

## 2024-01-28 DIAGNOSIS — G629 Polyneuropathy, unspecified: Secondary | ICD-10-CM | POA: Diagnosis not present

## 2024-01-28 DIAGNOSIS — Z9181 History of falling: Secondary | ICD-10-CM | POA: Diagnosis not present

## 2024-01-28 DIAGNOSIS — G20A1 Parkinson's disease without dyskinesia, without mention of fluctuations: Secondary | ICD-10-CM | POA: Diagnosis not present

## 2024-01-28 DIAGNOSIS — Z8673 Personal history of transient ischemic attack (TIA), and cerebral infarction without residual deficits: Secondary | ICD-10-CM | POA: Diagnosis not present

## 2024-01-28 NOTE — Patient Outreach (Signed)
 Complex Care Management   Visit Note  01/28/2024  Name:  Alex Frazier MRN: [id number] DOB: Jun 16, 1946  Situation: Referral received for Complex Care Management related to Parkinson's disease. I obtained verbal consent from Caregiver.  Visit completed with wife Alex Frazier on the phone.   Background:   Past Medical History:  Diagnosis Date   B12 deficiency    Borderline   Bulging discs    Degenerative disc disease, cervical    Dementia associated with Parkinson disease (HCC)    Gastritis    Hyperlipidemia    Neuropathy    Parkinson's disease (HCC)    Parkinsonian syndrome (HCC)    Peripheral neuropathy    TIA (transient ischemic attack)    TIA symptoms from hypotension    Assessment: Patient Reported Symptoms:  Cognitive Cognitive Status: Able to follow simple commands, Confused or disoriented, Requires Assistance Decision Making, Struggling with memory recall Cognitive/Intellectual Conditions Management [RPT]: Other Other: Parkinson's disease   Health Maintenance Behaviors: Annual physical exam  Neurological Neurological Review of Symptoms: No symptoms reported Neurological Conditions: Parkinson's disease Neurological Management Strategies: Medication therapy, Routine screening, Exercise Neurological Self-Management Outcome: 4 (good)  HEENT HEENT Symptoms Reported: Not assessed      Cardiovascular Cardiovascular Symptoms Reported: Other:, Lightheadness (hypotension) Does patient have uncontrolled Hypertension?: No Cardiovascular Conditions:  (Arterial Hypotension) Cardiovascular Management Strategies: Medication therapy, Routine screening Cardiovascular Self-Management Outcome: 4 (good)  Respiratory Respiratory Symptoms Reported: Dry cough Other Respiratory Symptoms: occasionally after drinking liquids - educated provided to patient's wife Additional Respiratory Details: wife monitors patient carefully for signs of aspiration Respiratory Conditions:  (high risk for  aspiration) Respiratory Self-Management Outcome: 4 (good)  Endocrine Patient reports the following symptoms related to hypoglycemia or hyperglycemia : No symptoms reported Is patient diabetic?: No    Gastrointestinal Gastrointestinal Symptoms Reported: No symptoms reported   Nutrition Risk Screen (CP): Difficulty chewing/swallowing (difficulty swallowing liquids secondary to Parkinson's)  Genitourinary Genitourinary Symptoms Reported: Frequency Genitourinary Conditions: Frequency Genitourinary Management Strategies: Fluid modification Genitourinary Self-Management Outcome: 3 (uncertain)  Integumentary Integumentary Symptoms Reported: Not assessed    Musculoskeletal Musculoskelatal Symptoms Reviewed: Difficulty walking, Unsteady gait, Weakness Musculoskeletal Conditions: Unsteady gait, Mobility limited, Other Other Musculoskeletal Conditions: Parkinson's disease Musculoskeletal Management Strategies: Adequate rest, Medical device, Routine screening Musculoskeletal Self-Management Outcome: 4 (good) Falls in the past year?: Yes Number of falls in past year: 1 or less Was there an injury with Fall?: No Fall Risk Category Calculator: 1 Patient Fall Risk Level: Low Fall Risk Patient at Risk for Falls Due to: History of fall(s), Impaired balance/gait, Impaired mobility Fall risk Follow up: Falls evaluation completed, Education provided, Falls prevention discussed  Psychosocial Psychosocial Symptoms Reported: Not assessed     Quality of Family Relationships: involved, supportive, helpful      08/27/2023    2:40 PM  Depression screen PHQ 2/9  Decreased Interest 1  Down, Depressed, Hopeless 1  PHQ - 2 Score 2  Altered sleeping 0  Tired, decreased energy 0  Change in appetite 0  Feeling bad or failure about yourself  0  Trouble concentrating 0  Moving slowly or fidgety/restless 0  Suicidal thoughts 0  PHQ-9 Score 2  Difficult doing work/chores Not difficult at all    There were  no vitals filed for this visit.  Medications Reviewed Today     Reviewed by Kaylene Pascal, RN (Registered Nurse) on 01/28/24 at 1413  Med List Status: <None>   Medication Order Taking? Sig Documenting Provider Last Dose Status Informant  acetaminophen  (TYLENOL ) 500 MG tablet [id number]  Take 2 tablets (1,000 mg total) by mouth 3 (three) times daily. Vada Garibaldi, MD  Active Spouse/Significant Other, Pharmacy Records  Calcium  Carb-Cholecalciferol  (CALCIUM  600 + D PO) [id number]  Take 1 tablet by mouth daily. [provider]  Active   carbidopa -levodopa  (SINEMET  IR) 25-100 MG tablet [id number]  Take 2 tablets by mouth 3 (three) times daily. Austine Lefort, MD  Active            Med Note Oletta Berry, ASHLEY M   Tue Dec 29, 2023  2:22 PM) Patient differently. Taking 1.5 tablets 3 times daily.  cloNIDine  (CATAPRES ) 0.1 MG tablet [id number]  Take 1 tablet (0.1 mg total) by mouth every 8 (eight) hours as needed. Use as needed for systolic BP over 629. Austine Lefort, MD  Active   Cyanocobalamin  (VITAMIN B 12 PO) [id number]  Take 1 tablet by mouth in the morning.  [provider]  Active Spouse/Significant Other, Pharmacy Records  ferrous sulfate 324 MG TBEC [id number] Yes Take 324 mg by mouth at bedtime. Austine Lefort, MD Taking Active   fludrocortisone  (FLORINEF ) 0.1 MG tablet [id number]  TAKE 2 TABLETS BY MOUTH DAILY Austine Lefort, MD  Active   FLUoxetine  (PROZAC ) 20 MG capsule [id number]  Take 1 capsule (20 mg total) by mouth daily. Austine Lefort, MD  Active   midodrine  (PROAMATINE ) 10 MG tablet [id number]  Take 1 tablet (10 mg total) by mouth 3 (three) times daily. Austine Lefort, MD  Active   polyethylene glycol powder (GLYCOLAX /MIRALAX ) 17 GM/SCOOP powder [id number]  Take 17 g by mouth See admin instructions. Mix 17 grams of powder into 4-8 ounces of prune juice and drink once a day [provider]  Active Spouse/Significant Other, Pharmacy Records   pravastatin  (PRAVACHOL ) 20 MG tablet [id number]  TAKE 1 TABLET BY MOUTH DAILY AT [facility] EQUIVALENT FOR PRAVACHOL  Austine Lefort, MD  Active   senna-docusate (SENOKOT-S) 8.6-50 MG tablet [id number]  Take 2 tablets by mouth at bedtime as needed for mild constipation. Austine Lefort, MD  Active Spouse/Significant Other, Pharmacy Records            Recommendation:   PCP Follow-up  Follow Up Plan:   Telephone follow up appointment date/time:  Thursday, June 19 at 09:00 AM  Louanne Roussel RN BSN CCM [place]  [facility] Health Nurse Care Coordinator  Direct Dial: [phone] Website: [email or web address]

## 2024-01-28 NOTE — Patient Instructions (Signed)
 Visit Information  Thank you for taking time to visit with me today. Please don't hesitate to contact me if I can be of assistance to you before our next scheduled appointment.  Your next care management appointment is by telephone on 02-25-24 at 09:00 AM  Please call the care guide team at [phone] if you need to cancel, schedule, or reschedule an appointment.   Please call 1-800-273-TALK (toll free, 24 hour hotline) if you are experiencing a Mental Health or Behavioral Health Crisis or need someone to talk to.  Louanne Roussel RN BSN CCM [place]  [facility], [facility] Health Nurse Care Coordinator  Direct Dial: [phone] Website: [email or web address]

## 2024-01-29 DIAGNOSIS — E538 Deficiency of other specified B group vitamins: Secondary | ICD-10-CM | POA: Diagnosis not present

## 2024-01-29 DIAGNOSIS — I951 Orthostatic hypotension: Secondary | ICD-10-CM | POA: Diagnosis not present

## 2024-01-29 DIAGNOSIS — G629 Polyneuropathy, unspecified: Secondary | ICD-10-CM | POA: Diagnosis not present

## 2024-01-29 DIAGNOSIS — G20A1 Parkinson's disease without dyskinesia, without mention of fluctuations: Secondary | ICD-10-CM | POA: Diagnosis not present

## 2024-01-29 DIAGNOSIS — F32A Depression, unspecified: Secondary | ICD-10-CM | POA: Diagnosis not present

## 2024-01-29 DIAGNOSIS — E7800 Pure hypercholesterolemia, unspecified: Secondary | ICD-10-CM | POA: Diagnosis not present

## 2024-02-02 DIAGNOSIS — G20A1 Parkinson's disease without dyskinesia, without mention of fluctuations: Secondary | ICD-10-CM | POA: Diagnosis not present

## 2024-02-02 DIAGNOSIS — E538 Deficiency of other specified B group vitamins: Secondary | ICD-10-CM | POA: Diagnosis not present

## 2024-02-02 DIAGNOSIS — G629 Polyneuropathy, unspecified: Secondary | ICD-10-CM | POA: Diagnosis not present

## 2024-02-02 DIAGNOSIS — I951 Orthostatic hypotension: Secondary | ICD-10-CM | POA: Diagnosis not present

## 2024-02-02 DIAGNOSIS — F32A Depression, unspecified: Secondary | ICD-10-CM | POA: Diagnosis not present

## 2024-02-02 DIAGNOSIS — E7800 Pure hypercholesterolemia, unspecified: Secondary | ICD-10-CM | POA: Diagnosis not present

## 2024-02-03 ENCOUNTER — Encounter: Payer: Self-pay | Admitting: Family Medicine

## 2024-02-03 DIAGNOSIS — E7800 Pure hypercholesterolemia, unspecified: Secondary | ICD-10-CM | POA: Diagnosis not present

## 2024-02-03 DIAGNOSIS — G20A1 Parkinson's disease without dyskinesia, without mention of fluctuations: Secondary | ICD-10-CM | POA: Diagnosis not present

## 2024-02-03 DIAGNOSIS — G629 Polyneuropathy, unspecified: Secondary | ICD-10-CM | POA: Diagnosis not present

## 2024-02-03 DIAGNOSIS — E538 Deficiency of other specified B group vitamins: Secondary | ICD-10-CM | POA: Diagnosis not present

## 2024-02-03 DIAGNOSIS — F32A Depression, unspecified: Secondary | ICD-10-CM | POA: Diagnosis not present

## 2024-02-03 DIAGNOSIS — I951 Orthostatic hypotension: Secondary | ICD-10-CM | POA: Diagnosis not present

## 2024-02-04 DIAGNOSIS — I951 Orthostatic hypotension: Secondary | ICD-10-CM | POA: Diagnosis not present

## 2024-02-04 DIAGNOSIS — F32A Depression, unspecified: Secondary | ICD-10-CM | POA: Diagnosis not present

## 2024-02-04 DIAGNOSIS — E538 Deficiency of other specified B group vitamins: Secondary | ICD-10-CM | POA: Diagnosis not present

## 2024-02-04 DIAGNOSIS — G629 Polyneuropathy, unspecified: Secondary | ICD-10-CM | POA: Diagnosis not present

## 2024-02-04 DIAGNOSIS — E7800 Pure hypercholesterolemia, unspecified: Secondary | ICD-10-CM | POA: Diagnosis not present

## 2024-02-04 DIAGNOSIS — G20A1 Parkinson's disease without dyskinesia, without mention of fluctuations: Secondary | ICD-10-CM | POA: Diagnosis not present

## 2024-02-05 DIAGNOSIS — I951 Orthostatic hypotension: Secondary | ICD-10-CM | POA: Diagnosis not present

## 2024-02-05 DIAGNOSIS — G20A1 Parkinson's disease without dyskinesia, without mention of fluctuations: Secondary | ICD-10-CM | POA: Diagnosis not present

## 2024-02-05 DIAGNOSIS — E7800 Pure hypercholesterolemia, unspecified: Secondary | ICD-10-CM | POA: Diagnosis not present

## 2024-02-05 DIAGNOSIS — G629 Polyneuropathy, unspecified: Secondary | ICD-10-CM | POA: Diagnosis not present

## 2024-02-05 DIAGNOSIS — E538 Deficiency of other specified B group vitamins: Secondary | ICD-10-CM | POA: Diagnosis not present

## 2024-02-05 DIAGNOSIS — F32A Depression, unspecified: Secondary | ICD-10-CM | POA: Diagnosis not present

## 2024-02-08 DIAGNOSIS — F32A Depression, unspecified: Secondary | ICD-10-CM | POA: Diagnosis not present

## 2024-02-08 DIAGNOSIS — G20A1 Parkinson's disease without dyskinesia, without mention of fluctuations: Secondary | ICD-10-CM | POA: Diagnosis not present

## 2024-02-08 DIAGNOSIS — G629 Polyneuropathy, unspecified: Secondary | ICD-10-CM | POA: Diagnosis not present

## 2024-02-08 DIAGNOSIS — E7800 Pure hypercholesterolemia, unspecified: Secondary | ICD-10-CM | POA: Diagnosis not present

## 2024-02-08 DIAGNOSIS — I951 Orthostatic hypotension: Secondary | ICD-10-CM | POA: Diagnosis not present

## 2024-02-08 DIAGNOSIS — E538 Deficiency of other specified B group vitamins: Secondary | ICD-10-CM | POA: Diagnosis not present

## 2024-02-09 DIAGNOSIS — I951 Orthostatic hypotension: Secondary | ICD-10-CM | POA: Diagnosis not present

## 2024-02-09 DIAGNOSIS — F32A Depression, unspecified: Secondary | ICD-10-CM | POA: Diagnosis not present

## 2024-02-09 DIAGNOSIS — G629 Polyneuropathy, unspecified: Secondary | ICD-10-CM | POA: Diagnosis not present

## 2024-02-09 DIAGNOSIS — E538 Deficiency of other specified B group vitamins: Secondary | ICD-10-CM | POA: Diagnosis not present

## 2024-02-09 DIAGNOSIS — G20A1 Parkinson's disease without dyskinesia, without mention of fluctuations: Secondary | ICD-10-CM | POA: Diagnosis not present

## 2024-02-09 DIAGNOSIS — E7800 Pure hypercholesterolemia, unspecified: Secondary | ICD-10-CM | POA: Diagnosis not present

## 2024-02-10 DIAGNOSIS — F02B0 Dementia in other diseases classified elsewhere, moderate, without behavioral disturbance, psychotic disturbance, mood disturbance, and anxiety: Secondary | ICD-10-CM | POA: Diagnosis not present

## 2024-02-10 DIAGNOSIS — E7800 Pure hypercholesterolemia, unspecified: Secondary | ICD-10-CM | POA: Diagnosis not present

## 2024-02-10 DIAGNOSIS — G629 Polyneuropathy, unspecified: Secondary | ICD-10-CM | POA: Diagnosis not present

## 2024-02-10 DIAGNOSIS — F32A Depression, unspecified: Secondary | ICD-10-CM | POA: Diagnosis not present

## 2024-02-10 DIAGNOSIS — I951 Orthostatic hypotension: Secondary | ICD-10-CM | POA: Diagnosis not present

## 2024-02-10 DIAGNOSIS — E538 Deficiency of other specified B group vitamins: Secondary | ICD-10-CM | POA: Diagnosis not present

## 2024-02-10 DIAGNOSIS — G20A1 Parkinson's disease without dyskinesia, without mention of fluctuations: Secondary | ICD-10-CM | POA: Diagnosis not present

## 2024-02-15 DIAGNOSIS — I951 Orthostatic hypotension: Secondary | ICD-10-CM | POA: Diagnosis not present

## 2024-02-15 DIAGNOSIS — G629 Polyneuropathy, unspecified: Secondary | ICD-10-CM | POA: Diagnosis not present

## 2024-02-15 DIAGNOSIS — E538 Deficiency of other specified B group vitamins: Secondary | ICD-10-CM | POA: Diagnosis not present

## 2024-02-15 DIAGNOSIS — E7800 Pure hypercholesterolemia, unspecified: Secondary | ICD-10-CM | POA: Diagnosis not present

## 2024-02-15 DIAGNOSIS — F32A Depression, unspecified: Secondary | ICD-10-CM | POA: Diagnosis not present

## 2024-02-15 DIAGNOSIS — G20A1 Parkinson's disease without dyskinesia, without mention of fluctuations: Secondary | ICD-10-CM | POA: Diagnosis not present

## 2024-02-16 ENCOUNTER — Telehealth: Payer: Self-pay

## 2024-02-16 ENCOUNTER — Other Ambulatory Visit: Payer: Self-pay | Admitting: Family Medicine

## 2024-02-16 NOTE — Telephone Encounter (Unsigned)
 Copied from CRM [id number]. Topic: Clinical - Medication Refill >> Feb 16, 2024 12:12 PM Thliyah D wrote: Medication: fludrocortisone  (FLORINEF ) 0.1 MG tablet  Has the patient contacted their pharmacy? Yes (Agent: If no, request that the patient contact the pharmacy for the refill. If patient does not wish to contact the pharmacy document the reason why and proceed with request.) (Agent: If yes, when and what did the pharmacy advise?)  This is the patient's preferred pharmacy:  [facility] - [place], [place] - [address] [place] [address] Phone: [phone] Fax: [phone]  Is this the correct pharmacy for this prescription? Yes If no, delete pharmacy and type the correct one.   Has the prescription been filled recently? No  Is the patient out of the medication? Yes  Has the patient been seen for an appointment in the last year OR does the patient have an upcoming appointment? Yes  Can we respond through MyChart? No  Agent: Please be advised that Rx refills may take up to 3 business days. We ask that you follow-up with your pharmacy.

## 2024-02-16 NOTE — Telephone Encounter (Signed)
 Copied from CRM [id number]. Topic: Clinical - Home Health Verbal Orders >> Feb 15, 2024  5:01 PM Chrystal Crape R wrote: Caller/Agency: Debbie/ [facility]  Callback Number: [phone] voicemail can be left  Service Requested: Physical Therapy and-Occupational for a maintenance program.-Home health aide Frequency: PT & OT 1x week for 9 weeks for each HHA- 2x week for 9 weeks   Any new concerns about the patient? No

## 2024-02-16 NOTE — Telephone Encounter (Signed)
 Verbal order for PT, OT, and Home assistance given to Debbie at [facility] .

## 2024-02-16 NOTE — Telephone Encounter (Signed)
 Called to give verbal order by phone at physician's request. No contact made . Lvm for call back.

## 2024-02-17 NOTE — Telephone Encounter (Signed)
 Requested medication (s) are due for refill today: yes  Requested medication (s) are on the active medication list: yes  Last refill:  10/19/23  Future visit scheduled: no  Notes to clinic:  Unable to refill per protocol, cannot delegate.      Requested Prescriptions  Pending Prescriptions Disp Refills   fludrocortisone  (FLORINEF ) 0.1 MG tablet 180 tablet 0    Sig: Take 2 tablets (200 mcg total) by mouth daily.     Not Delegated - Endocrinology: Oral Corticosteroids - fludrocortisone  Failed - 02/17/2024  2:22 PM      Failed - This refill cannot be delegated      Failed - Manual Review: Eye exam for IOP if prolonged treatment      Failed - Glucose (serum) in normal range and within 180 days    Glucose, Bld  Date Value Ref Range Status  12/08/2023 103 (H) 65 - 99 mg/dL Final    Comment:    .            Fasting reference interval . For someone without known diabetes, a glucose value between 100 and 125 mg/dL is consistent with prediabetes and should be confirmed with a follow-up test. .          Failed - Bone Mineral Density or Dexa Scan completed in the last 2 years      Passed - K in normal range and within 180 days    Potassium  Date Value Ref Range Status  12/08/2023 4.1 3.5 - 5.3 mmol/L Final         Passed - Na in normal range and within 180 days    Sodium  Date Value Ref Range Status  12/08/2023 139 135 - 146 mmol/L Final         Passed - Last BP in normal range    BP Readings from Last 1 Encounters:  12/08/23 124/62         Passed - Valid encounter within last 6 months    Recent Outpatient Visits           2 months ago Parkinson's disease, unspecified whether dyskinesia present, unspecified whether manifestations fluctuate (HCC)   [place] [facility] Pickard, Cisco Crest, MD   8 months ago Delirium   [facility] [facility] Austine Lefort, MD   1 year ago Parkinson's disease, unspecified whether dyskinesia  present, unspecified whether manifestations fluctuate   [place] [facility] Cheril Cork, Cisco Crest, MD   1 year ago Parkinson's disease [facility])   [place] [facility] Pickard, Cisco Crest, MD

## 2024-02-18 DIAGNOSIS — E538 Deficiency of other specified B group vitamins: Secondary | ICD-10-CM | POA: Diagnosis not present

## 2024-02-18 DIAGNOSIS — F32A Depression, unspecified: Secondary | ICD-10-CM | POA: Diagnosis not present

## 2024-02-18 DIAGNOSIS — I951 Orthostatic hypotension: Secondary | ICD-10-CM | POA: Diagnosis not present

## 2024-02-18 DIAGNOSIS — E7800 Pure hypercholesterolemia, unspecified: Secondary | ICD-10-CM | POA: Diagnosis not present

## 2024-02-18 DIAGNOSIS — G20A1 Parkinson's disease without dyskinesia, without mention of fluctuations: Secondary | ICD-10-CM | POA: Diagnosis not present

## 2024-02-18 DIAGNOSIS — G629 Polyneuropathy, unspecified: Secondary | ICD-10-CM | POA: Diagnosis not present

## 2024-02-18 MED ORDER — FLUDROCORTISONE ACETATE 0.1 MG PO TABS: 200.0000 ug | ORAL_TABLET | Freq: Every day | ORAL | 0 refills | Status: DC

## 2024-02-25 ENCOUNTER — Encounter (HOSPITAL_COMMUNITY): Payer: Self-pay | Admitting: Emergency Medicine

## 2024-02-25 ENCOUNTER — Emergency Department (HOSPITAL_COMMUNITY)

## 2024-02-25 ENCOUNTER — Inpatient Hospital Stay (HOSPITAL_COMMUNITY)
Admission: EM | Admit: 2024-02-25 | Discharge: 2024-03-08 | DRG: 871 | Disposition: E | Attending: Pulmonary Disease | Admitting: Pulmonary Disease

## 2024-02-25 ENCOUNTER — Other Ambulatory Visit: Payer: Self-pay

## 2024-02-25 ENCOUNTER — Telehealth: Payer: Self-pay

## 2024-02-25 DIAGNOSIS — G609 Hereditary and idiopathic neuropathy, unspecified: Secondary | ICD-10-CM | POA: Diagnosis present

## 2024-02-25 DIAGNOSIS — J189 Pneumonia, unspecified organism: Secondary | ICD-10-CM | POA: Diagnosis not present

## 2024-02-25 DIAGNOSIS — L89322 Pressure ulcer of left buttock, stage 2: Secondary | ICD-10-CM | POA: Diagnosis not present

## 2024-02-25 DIAGNOSIS — E861 Hypovolemia: Secondary | ICD-10-CM | POA: Diagnosis present

## 2024-02-25 DIAGNOSIS — R6521 Severe sepsis with septic shock: Secondary | ICD-10-CM | POA: Diagnosis not present

## 2024-02-25 DIAGNOSIS — G9341 Metabolic encephalopathy: Secondary | ICD-10-CM | POA: Diagnosis present

## 2024-02-25 DIAGNOSIS — Z0389 Encounter for observation for other suspected diseases and conditions ruled out: Secondary | ICD-10-CM | POA: Diagnosis not present

## 2024-02-25 DIAGNOSIS — F0283 Dementia in other diseases classified elsewhere, unspecified severity, with mood disturbance: Secondary | ICD-10-CM | POA: Diagnosis present

## 2024-02-25 DIAGNOSIS — E871 Hypo-osmolality and hyponatremia: Secondary | ICD-10-CM | POA: Diagnosis not present

## 2024-02-25 DIAGNOSIS — J9601 Acute respiratory failure with hypoxia: Secondary | ICD-10-CM | POA: Diagnosis not present

## 2024-02-25 DIAGNOSIS — I38 Endocarditis, valve unspecified: Secondary | ICD-10-CM | POA: Diagnosis not present

## 2024-02-25 DIAGNOSIS — N201 Calculus of ureter: Secondary | ICD-10-CM | POA: Diagnosis not present

## 2024-02-25 DIAGNOSIS — Z66 Do not resuscitate: Secondary | ICD-10-CM | POA: Diagnosis not present

## 2024-02-25 DIAGNOSIS — R569 Unspecified convulsions: Secondary | ICD-10-CM

## 2024-02-25 DIAGNOSIS — R404 Transient alteration of awareness: Secondary | ICD-10-CM | POA: Diagnosis not present

## 2024-02-25 DIAGNOSIS — L89611 Pressure ulcer of right heel, stage 1: Secondary | ICD-10-CM | POA: Diagnosis not present

## 2024-02-25 DIAGNOSIS — N1330 Unspecified hydronephrosis: Secondary | ICD-10-CM | POA: Diagnosis not present

## 2024-02-25 DIAGNOSIS — Z7401 Bed confinement status: Secondary | ICD-10-CM

## 2024-02-25 DIAGNOSIS — I639 Cerebral infarction, unspecified: Secondary | ICD-10-CM | POA: Diagnosis not present

## 2024-02-25 DIAGNOSIS — Z8673 Personal history of transient ischemic attack (TIA), and cerebral infarction without residual deficits: Secondary | ICD-10-CM

## 2024-02-25 DIAGNOSIS — D696 Thrombocytopenia, unspecified: Secondary | ICD-10-CM | POA: Diagnosis not present

## 2024-02-25 DIAGNOSIS — N10 Acute tubulo-interstitial nephritis: Secondary | ICD-10-CM | POA: Diagnosis not present

## 2024-02-25 DIAGNOSIS — R9082 White matter disease, unspecified: Secondary | ICD-10-CM | POA: Diagnosis not present

## 2024-02-25 DIAGNOSIS — J690 Pneumonitis due to inhalation of food and vomit: Secondary | ICD-10-CM | POA: Diagnosis not present

## 2024-02-25 DIAGNOSIS — Z7952 Long term (current) use of systemic steroids: Secondary | ICD-10-CM

## 2024-02-25 DIAGNOSIS — L89621 Pressure ulcer of left heel, stage 1: Secondary | ICD-10-CM | POA: Diagnosis present

## 2024-02-25 DIAGNOSIS — A419 Sepsis, unspecified organism: Secondary | ICD-10-CM | POA: Diagnosis not present

## 2024-02-25 DIAGNOSIS — F0280 Dementia in other diseases classified elsewhere without behavioral disturbance: Secondary | ICD-10-CM | POA: Diagnosis present

## 2024-02-25 DIAGNOSIS — N136 Pyonephrosis: Secondary | ICD-10-CM | POA: Diagnosis present

## 2024-02-25 DIAGNOSIS — E874 Mixed disorder of acid-base balance: Secondary | ICD-10-CM | POA: Diagnosis not present

## 2024-02-25 DIAGNOSIS — N179 Acute kidney failure, unspecified: Secondary | ICD-10-CM | POA: Diagnosis present

## 2024-02-25 DIAGNOSIS — L89312 Pressure ulcer of right buttock, stage 2: Secondary | ICD-10-CM | POA: Diagnosis present

## 2024-02-25 DIAGNOSIS — Z820 Family history of epilepsy and other diseases of the nervous system: Secondary | ICD-10-CM

## 2024-02-25 DIAGNOSIS — I959 Hypotension, unspecified: Secondary | ICD-10-CM | POA: Diagnosis not present

## 2024-02-25 DIAGNOSIS — G20A1 Parkinson's disease without dyskinesia, without mention of fluctuations: Secondary | ICD-10-CM | POA: Diagnosis present

## 2024-02-25 DIAGNOSIS — G9340 Encephalopathy, unspecified: Secondary | ICD-10-CM | POA: Diagnosis not present

## 2024-02-25 DIAGNOSIS — I951 Orthostatic hypotension: Secondary | ICD-10-CM | POA: Diagnosis not present

## 2024-02-25 DIAGNOSIS — N3289 Other specified disorders of bladder: Secondary | ICD-10-CM | POA: Diagnosis not present

## 2024-02-25 DIAGNOSIS — G909 Disorder of the autonomic nervous system, unspecified: Secondary | ICD-10-CM | POA: Diagnosis present

## 2024-02-25 DIAGNOSIS — E43 Unspecified severe protein-calorie malnutrition: Secondary | ICD-10-CM | POA: Diagnosis present

## 2024-02-25 DIAGNOSIS — Z888 Allergy status to other drugs, medicaments and biological substances status: Secondary | ICD-10-CM

## 2024-02-25 DIAGNOSIS — Z1152 Encounter for screening for COVID-19: Secondary | ICD-10-CM | POA: Diagnosis not present

## 2024-02-25 DIAGNOSIS — N12 Tubulo-interstitial nephritis, not specified as acute or chronic: Secondary | ICD-10-CM | POA: Diagnosis not present

## 2024-02-25 DIAGNOSIS — Z515 Encounter for palliative care: Secondary | ICD-10-CM

## 2024-02-25 DIAGNOSIS — M4856XA Collapsed vertebra, not elsewhere classified, lumbar region, initial encounter for fracture: Secondary | ICD-10-CM | POA: Diagnosis not present

## 2024-02-25 DIAGNOSIS — Z7189 Other specified counseling: Secondary | ICD-10-CM | POA: Diagnosis not present

## 2024-02-25 DIAGNOSIS — G039 Meningitis, unspecified: Secondary | ICD-10-CM | POA: Diagnosis not present

## 2024-02-25 DIAGNOSIS — R739 Hyperglycemia, unspecified: Secondary | ICD-10-CM | POA: Diagnosis not present

## 2024-02-25 DIAGNOSIS — R000 Tachycardia, unspecified: Secondary | ICD-10-CM | POA: Diagnosis not present

## 2024-02-25 DIAGNOSIS — E876 Hypokalemia: Secondary | ICD-10-CM | POA: Diagnosis present

## 2024-02-25 DIAGNOSIS — R9089 Other abnormal findings on diagnostic imaging of central nervous system: Secondary | ICD-10-CM | POA: Diagnosis not present

## 2024-02-25 DIAGNOSIS — J811 Chronic pulmonary edema: Secondary | ICD-10-CM | POA: Diagnosis not present

## 2024-02-25 DIAGNOSIS — Z886 Allergy status to analgesic agent status: Secondary | ICD-10-CM

## 2024-02-25 DIAGNOSIS — N390 Urinary tract infection, site not specified: Secondary | ICD-10-CM | POA: Diagnosis not present

## 2024-02-25 DIAGNOSIS — A4159 Other Gram-negative sepsis: Principal | ICD-10-CM | POA: Diagnosis present

## 2024-02-25 DIAGNOSIS — Z6821 Body mass index (BMI) 21.0-21.9, adult: Secondary | ICD-10-CM

## 2024-02-25 DIAGNOSIS — Z79899 Other long term (current) drug therapy: Secondary | ICD-10-CM

## 2024-02-25 DIAGNOSIS — R0902 Hypoxemia: Secondary | ICD-10-CM | POA: Diagnosis not present

## 2024-02-25 DIAGNOSIS — R0689 Other abnormalities of breathing: Secondary | ICD-10-CM | POA: Diagnosis not present

## 2024-02-25 DIAGNOSIS — Z8249 Family history of ischemic heart disease and other diseases of the circulatory system: Secondary | ICD-10-CM

## 2024-02-25 DIAGNOSIS — E785 Hyperlipidemia, unspecified: Secondary | ICD-10-CM | POA: Diagnosis present

## 2024-02-25 DIAGNOSIS — R040 Epistaxis: Secondary | ICD-10-CM | POA: Diagnosis not present

## 2024-02-25 DIAGNOSIS — Z4682 Encounter for fitting and adjustment of non-vascular catheter: Secondary | ICD-10-CM | POA: Diagnosis not present

## 2024-02-25 DIAGNOSIS — N132 Hydronephrosis with renal and ureteral calculous obstruction: Secondary | ICD-10-CM | POA: Diagnosis not present

## 2024-02-25 DIAGNOSIS — Q048 Other specified congenital malformations of brain: Secondary | ICD-10-CM | POA: Diagnosis not present

## 2024-02-25 DIAGNOSIS — N111 Chronic obstructive pyelonephritis: Secondary | ICD-10-CM | POA: Diagnosis not present

## 2024-02-25 DIAGNOSIS — J90 Pleural effusion, not elsewhere classified: Secondary | ICD-10-CM | POA: Diagnosis not present

## 2024-02-25 DIAGNOSIS — E870 Hyperosmolality and hypernatremia: Secondary | ICD-10-CM | POA: Diagnosis not present

## 2024-02-25 DIAGNOSIS — Z833 Family history of diabetes mellitus: Secondary | ICD-10-CM

## 2024-02-25 DIAGNOSIS — R918 Other nonspecific abnormal finding of lung field: Secondary | ICD-10-CM | POA: Diagnosis not present

## 2024-02-25 DIAGNOSIS — I10 Essential (primary) hypertension: Secondary | ICD-10-CM | POA: Diagnosis not present

## 2024-02-25 DIAGNOSIS — B964 Proteus (mirabilis) (morganii) as the cause of diseases classified elsewhere: Secondary | ICD-10-CM | POA: Diagnosis not present

## 2024-02-25 DIAGNOSIS — R509 Fever, unspecified: Secondary | ICD-10-CM | POA: Diagnosis not present

## 2024-02-25 DIAGNOSIS — R6889 Other general symptoms and signs: Secondary | ICD-10-CM | POA: Diagnosis not present

## 2024-02-25 LAB — CBC WITH DIFFERENTIAL/PLATELET
Abs Immature Granulocytes: 0.05 10*3/uL (ref 0.00–0.07)
Basophils Absolute: 0 10*3/uL (ref 0.0–0.1)
Basophils Relative: 1 %
Eosinophils Absolute: 0 10*3/uL (ref 0.0–0.5)
Eosinophils Relative: 0 %
HCT: 35.5 % — ABNORMAL LOW (ref 39.0–52.0)
Hemoglobin: 11.4 g/dL — ABNORMAL LOW (ref 13.0–17.0)
Immature Granulocytes: 1 %
Lymphocytes Relative: 4 %
Lymphs Abs: 0.2 10*3/uL — ABNORMAL LOW (ref 0.7–4.0)
MCH: 28.6 pg (ref 26.0–34.0)
MCHC: 32.1 g/dL (ref 30.0–36.0)
MCV: 89 fL (ref 80.0–100.0)
Monocytes Absolute: 0.1 10*3/uL (ref 0.1–1.0)
Monocytes Relative: 2 %
Neutro Abs: 5.4 10*3/uL (ref 1.7–7.7)
Neutrophils Relative %: 92 %
Platelets: 144 10*3/uL — ABNORMAL LOW (ref 150–400)
RBC: 3.99 MIL/uL — ABNORMAL LOW (ref 4.22–5.81)
RDW: 14.1 % (ref 11.5–15.5)
WBC Morphology: INCREASED
WBC: 5.8 10*3/uL (ref 4.0–10.5)
nRBC: 0 % (ref 0.0–0.2)

## 2024-02-25 LAB — RESP PANEL BY RT-PCR (RSV, FLU A&B, COVID)  RVPGX2
Influenza A by PCR: NEGATIVE
Influenza B by PCR: NEGATIVE
Resp Syncytial Virus by PCR: NEGATIVE
SARS Coronavirus 2 by RT PCR: NEGATIVE

## 2024-02-25 LAB — BLOOD GAS, VENOUS
Acid-base deficit: 2.7 mmol/L — ABNORMAL HIGH (ref 0.0–2.0)
Bicarbonate: 20.4 mmol/L (ref 20.0–28.0)
O2 Saturation: 30 %
Patient temperature: 37
pCO2, Ven: 30 mmHg — ABNORMAL LOW (ref 44–60)
pH, Ven: 7.44 — ABNORMAL HIGH (ref 7.25–7.43)
pO2, Ven: <31 mmHg — CL (ref 32–45)

## 2024-02-25 LAB — COMPREHENSIVE METABOLIC PANEL WITH GFR
ALT: 15 U/L (ref 0–44)
AST: 37 U/L (ref 15–41)
Albumin: 3 g/dL — ABNORMAL LOW (ref 3.5–5.0)
Alkaline Phosphatase: 169 U/L — ABNORMAL HIGH (ref 38–126)
Anion gap: 14 (ref 5–15)
BUN: 27 mg/dL — ABNORMAL HIGH (ref 8–23)
CO2: 19 mmol/L — ABNORMAL LOW (ref 22–32)
Calcium: 8.2 mg/dL — ABNORMAL LOW (ref 8.9–10.3)
Chloride: 105 mmol/L (ref 98–111)
Creatinine, Ser: 1.5 mg/dL — ABNORMAL HIGH (ref 0.61–1.24)
GFR, Estimated: 48 mL/min — ABNORMAL LOW (ref >60)
Glucose, Bld: 132 mg/dL — ABNORMAL HIGH (ref 70–99)
Potassium: 3.7 mmol/L (ref 3.5–5.1)
Sodium: 138 mmol/L (ref 135–145)
Total Bilirubin: 1.6 mg/dL — ABNORMAL HIGH (ref 0.0–1.2)
Total Protein: 6.2 g/dL — ABNORMAL LOW (ref 6.5–8.1)

## 2024-02-25 LAB — URINALYSIS, W/ REFLEX TO CULTURE (INFECTION SUSPECTED)
Bilirubin Urine: NEGATIVE
Glucose, UA: NEGATIVE mg/dL
Ketones, ur: NEGATIVE mg/dL
Nitrite: NEGATIVE
Protein, ur: 100 mg/dL — AB
RBC / HPF: >50 RBC/hpf (ref 0–5)
Specific Gravity, Urine: 1.012 (ref 1.005–1.030)
WBC, UA: >50 WBC/hpf (ref 0–5)
pH: 8 (ref 5.0–8.0)

## 2024-02-25 LAB — BRAIN NATRIURETIC PEPTIDE: B Natriuretic Peptide: 215.1 pg/mL — ABNORMAL HIGH (ref 0.0–100.0)

## 2024-02-25 LAB — CBG MONITORING, ED: Glucose-Capillary: 116 mg/dL — ABNORMAL HIGH (ref 70–99)

## 2024-02-25 LAB — LACTIC ACID, PLASMA: Lactic Acid, Venous: 6.7 mmol/L (ref 0.5–1.9)

## 2024-02-25 LAB — I-STAT CG4 LACTIC ACID, ED: Lactic Acid, Venous: 7.9 mmol/L (ref 0.5–1.9)

## 2024-02-25 MED ORDER — CHLORHEXIDINE GLUCONATE CLOTH 2 % EX PADS
6.0000 | MEDICATED_PAD | Freq: Every day | CUTANEOUS | Status: DC
  Administered 2024-02-26 – 2024-03-05 (×9): 6 via TOPICAL

## 2024-02-25 MED ORDER — INSULIN ASPART 100 UNIT/ML IJ SOLN
0.0000 [IU] | INTRAMUSCULAR | Status: DC
  Administered 2024-02-26 – 2024-02-27 (×2): 1 [IU] via SUBCUTANEOUS
  Administered 2024-02-27 (×2): 2 [IU] via SUBCUTANEOUS
  Administered 2024-02-27: 1 [IU] via SUBCUTANEOUS
  Administered 2024-02-28 (×3): 2 [IU] via SUBCUTANEOUS
  Administered 2024-02-28 – 2024-03-01 (×8): 1 [IU] via SUBCUTANEOUS
  Administered 2024-03-01 (×2): 2 [IU] via SUBCUTANEOUS
  Administered 2024-03-02 (×3): 1 [IU] via SUBCUTANEOUS
  Administered 2024-03-03: 2 [IU] via SUBCUTANEOUS
  Administered 2024-03-03 – 2024-03-04 (×8): 1 [IU] via SUBCUTANEOUS
  Filled 2024-02-25: qty 0.09

## 2024-02-25 MED ORDER — LORAZEPAM 2 MG/ML IJ SOLN
2.0000 mg | Freq: Once | INTRAMUSCULAR | Status: AC
  Administered 2024-02-25: 2 mg via INTRAVENOUS
  Filled 2024-02-25: qty 1

## 2024-02-25 MED ORDER — ORAL CARE MOUTH RINSE
15.0000 mL | OROMUCOSAL | Status: DC | PRN
  Administered 2024-02-28 – 2024-03-02 (×3): 15 mL via OROMUCOSAL

## 2024-02-25 MED ORDER — LORAZEPAM 2 MG/ML IJ SOLN: 4.0000 mg | Freq: Once | INTRAMUSCULAR | Status: DC

## 2024-02-25 MED ORDER — HEPARIN SODIUM (PORCINE) 5000 UNIT/ML IJ SOLN
5000.0000 [IU] | Freq: Three times a day (TID) | INTRAMUSCULAR | Status: DC
  Administered 2024-02-26: 5000 [IU] via SUBCUTANEOUS
  Filled 2024-02-25: qty 1

## 2024-02-25 MED ORDER — LORAZEPAM 2 MG/ML IJ SOLN
INTRAMUSCULAR | Status: AC
  Administered 2024-02-25: 2 mg
  Filled 2024-02-25: qty 2

## 2024-02-25 MED ORDER — LACTATED RINGERS IV BOLUS (SEPSIS)
1000.0000 mL | Freq: Once | INTRAVENOUS | Status: AC
  Administered 2024-02-25: 1000 mL via INTRAVENOUS

## 2024-02-25 MED ORDER — ACETAMINOPHEN 650 MG RE SUPP
650.0000 mg | Freq: Once | RECTAL | Status: AC
  Administered 2024-02-25: 650 mg via RECTAL

## 2024-02-25 MED ORDER — LEVETIRACETAM (KEPPRA) 500 MG/5 ML ADULT IV PUSH
1500.0000 mg | Freq: Once | INTRAVENOUS | Status: AC
  Administered 2024-02-25: 1500 mg via INTRAVENOUS
  Filled 2024-02-25: qty 15

## 2024-02-25 MED ORDER — VANCOMYCIN HCL IN DEXTROSE 1-5 GM/200ML-% IV SOLN
1000.0000 mg | Freq: Once | INTRAVENOUS | Status: AC
  Filled 2024-02-25: qty 200

## 2024-02-25 MED ORDER — ACETAMINOPHEN 650 MG RE SUPP
RECTAL | Status: AC
  Filled 2024-02-25: qty 1

## 2024-02-25 MED ORDER — PIPERACILLIN-TAZOBACTAM 3.375 G IVPB 30 MIN
3.3750 g | Freq: Once | INTRAVENOUS | Status: AC
  Administered 2024-02-25: 3.375 g via INTRAVENOUS
  Filled 2024-02-25: qty 50

## 2024-02-25 MED ORDER — PIPERACILLIN-TAZOBACTAM 3.375 G IVPB 30 MIN: 3.3750 g | Freq: Four times a day (QID) | INTRAVENOUS | Status: DC

## 2024-02-25 MED ORDER — DOCUSATE SODIUM 100 MG PO CAPS: 100.0000 mg | ORAL_CAPSULE | Freq: Two times a day (BID) | ORAL | Status: DC | PRN

## 2024-02-25 MED ORDER — POLYETHYLENE GLYCOL 3350 17 G PO PACK: 17.0000 g | PACK | Freq: Every day | ORAL | Status: DC | PRN

## 2024-02-25 NOTE — ED Triage Notes (Signed)
 Pt BIB EMS from home, c/o increased weakness and fatigue. Altered at baseline, spouse called d/t more altered than usual 4 liters placed by EMS. Pt aggressive tremors, unable to obtain BP, 500 LR, capnography 20  P 120 RR 26 SpO2 87% room air CBG 130

## 2024-02-25 NOTE — ED Notes (Signed)
 [facility] called for transport. JRPRN

## 2024-02-25 NOTE — ED Provider Notes (Signed)
 Patient was initially seen by Dr. Clarisa Crooked.  Please see her note.  Patient was evaluated for possible sepsis as well as altered mental status.  Patient was noted to have fevers up to 105.  Patient still remains altered.  Etiology is infection unclear.  Chest x-ray without definite pneumonia.  Patient does not have any significant leukocytosis.  He does have significant lactic acidosis although that is decreasing.  It is possible could be related to the seizure as well as infection.  Patient has been started on broad-spectrum antibiotics as well as IV fluids.  Dr. Drury Geralds consulted with neurology.  Plan is for admission to [facility].  I will consult the hospitalist for admission  Patient's urinalysis does show urinary tract infection.  I spoke with Dr. Francesco Inks who recommended ICU admission concerning his persistent elevation of lactic acid level.  I consulted with Dr. Talbot Factor and he agrees to admission to the ICU at [facility]  .Critical Care  Performed by: Trish Furl, MD Authorized by: Trish Furl, MD   Critical care provider statement:    Critical care time (minutes):  35   Critical care was time spent personally by me on the following activities:  Development of treatment plan with patient or surrogate, discussions with consultants, evaluation of patient's response to treatment, examination of patient, ordering and review of laboratory studies, ordering and review of radiographic studies, ordering and performing treatments and interventions, pulse oximetry, re-evaluation of patient's condition and review of old charts     Trish Furl, MD 02/25/24 2101

## 2024-02-25 NOTE — Consult Note (Signed)
 NEUROLOGY CONSULT NOTE   Date of service: February 25, 2024 Patient Name: Alex Frazier MRN:  [id number] DOB:  Aug 06, 1946 Chief Complaint: altered mental status Requesting Provider: Randol Simmonds, MD  History of Present Illness  Alex Frazier is a 78 y.o. male with hx of Parkinson's disease, dementia, autonomic dysfunction on midodrine , neuropathy  Family reports that he was his normal self the past few days, did have some generalized weakness for at least 2 to 3 days but this is not an unusual fluctuation for him.  Interacted with family normally last night.  However this morning was nearly unresponsive having diffuse tremors with left gaze deviation, blood pressures 200s over 100s.  Felt warm to the touch and axillary temperature was 104  On ED provider evaluation at [facility] noted to have the left gaze deviation and therefore Ativan  was administered which did improve his tremors/gaze  LKW: 6/19 evening Modified rankin score: 4-5 IV Thrombolysis: No, out of the window EVT: No, exam not consistent with LVO   NIHSS components Score: Comment  1a Level of Conscious 0[]  1[]  2[x]  3[]      1b LOC Questions 0[]  1[]  2[x]       1c LOC Commands 0[]  1[]  2[x]       2 Best Gaze 0[x]  1[]  2[]       3 Visual 0[]  1[]  2[]  3[x]      4 Facial Palsy 0[x]  1[]  2[]  3[]      5a Motor Arm - left 0[]  1[]  2[]  3[x]  4[]  UN[]    5b Motor Arm - Right 0[]  1[]  2[]  3[x]  4[]  UN[]    6a Motor Leg - Left 0[]  1[]  2[]  3[x]  4[]  UN[]    6b Motor Leg - Right 0[]  1[]  2[]  3[x]  4[]  UN[]    7 Limb Ataxia 0[]  1[]  2[]  UN[x]      8 Sensory 0[x]  1[]  2[]  UN[]      9 Best Language 0[]  1[]  2[]  3[x]      10 Dysarthria 0[]  1[]  2[x]  UN[]      11 Extinct. and Inattention 0[x]  1[]  2[]       TOTAL: 26       ROS  Unable to assess secondary to patient's mental status   Past History   Past Medical History:  Diagnosis Date   B12 deficiency    Borderline   Bulging discs    Degenerative disc disease, cervical    Dementia associated with  Parkinson disease (HCC)    Gastritis    Hyperlipidemia    Neuropathy    Parkinson's disease (HCC)    Parkinsonian syndrome (HCC)    Peripheral neuropathy    TIA (transient ischemic attack)    TIA symptoms from hypotension    Past Surgical History:  Procedure Laterality Date   APPENDECTOMY     BACK SURGERY      Family History: Family History  Problem Relation Age of Onset   Parkinson's disease Mother    Parkinson's disease Father    Diabetes Sister    Heart disease Brother     Social History  reports that he has never smoked. He has never used smokeless tobacco. He reports that he does not drink alcohol and does not use drugs.  Allergies  Allergen Reactions   Tape Other (See Comments)    SKIN IS VERY THIN- TEARS VERY EASILY!!   Hydrocodone -Acetaminophen  Other (See Comments)    Hallucinations and confusion   Indomethacin Other (See Comments)    Unknown reaction   Lyrica  [Pregabalin ] Other (See Comments)    Vivid hallucinations  Nsaids Other (See Comments)    Per family patient gets hallucination/confusion.   Quetiapine  Other (See Comments)    HYPOtension!!    Medications   Current Facility-Administered Medications:    lactated ringers  bolus 1,000 mL, 1,000 mL, Intravenous, Once, Randol Simmonds, MD, Last Rate: 999 mL/hr at 02/25/24 1816, 1,000 mL at 02/25/24 1816   LORazepam  (ATIVAN ) injection 4 mg, 4 mg, Intravenous, Once, Franklyn Sid SAILOR, MD  Current Outpatient Medications:    acetaminophen  (TYLENOL ) 500 MG tablet, Take 2 tablets (1,000 mg total) by mouth 3 (three) times daily., Disp: 30 tablet, Rfl: 0   Calcium  Carb-Cholecalciferol  (CALCIUM  600 + D PO), Take 1 tablet by mouth daily., Disp: , Rfl:    carbidopa -levodopa  (SINEMET  IR) 25-100 MG tablet, Take 2 tablets by mouth 3 (three) times daily. (Patient taking differently: Take 1.5 tablets by mouth 3 (three) times daily.), Disp: 540 tablet, Rfl: 3   cloNIDine  (CATAPRES ) 0.1 MG tablet, Take 1 tablet (0.1 mg total)  by mouth every 8 (eight) hours as needed. Use as needed for systolic BP over 819., Disp: 30 tablet, Rfl: 3   Cyanocobalamin  (VITAMIN B 12 PO), Take 1 tablet by mouth in the morning. , Disp: , Rfl:    ferrous sulfate 324 MG TBEC, Take 324 mg by mouth at bedtime., Disp: , Rfl:    fludrocortisone  (FLORINEF ) 0.1 MG tablet, Take 2 tablets (200 mcg total) by mouth daily., Disp: 180 tablet, Rfl: 0   FLUoxetine  (PROZAC ) 20 MG capsule, Take 1 capsule (20 mg total) by mouth daily., Disp: 90 capsule, Rfl: 3   midodrine  (PROAMATINE ) 10 MG tablet, Take 1 tablet (10 mg total) by mouth 3 (three) times daily. (Patient taking differently: Take 5 mg by mouth 3 (three) times daily.), Disp: 270 tablet, Rfl: 1   polyethylene glycol powder (GLYCOLAX /MIRALAX ) 17 GM/SCOOP powder, Take 17 g by mouth See admin instructions. Mix 17 grams of powder into 4-8 ounces of prune juice and drink once a day, Disp: , Rfl:    pravastatin  (PRAVACHOL ) 20 MG tablet, TAKE 1 TABLET BY MOUTH DAILY AT 6PM GENERIC EQUIVALENT FOR PRAVACHOL , Disp: 90 tablet, Rfl: 3   senna-docusate (SENOKOT-S) 8.6-50 MG tablet, Take 2 tablets by mouth at bedtime as needed for mild constipation., Disp: 60 tablet, Rfl: 2  Vitals   Vitals:   02/25/24 1715 02/25/24 1730 02/25/24 1745 02/25/24 1800  BP: 95/67 107/66 103/61 (!) 86/61  Pulse: (!) 111 (!) 108 (!) 105 (!) 101  Resp: (!) 31 (!) 23 (!) 28 (!) 21  Temp:   (!) 105.3 F (40.7 C)   TempSrc:   Rectal   SpO2: 100% 100% 100% 100%    There is no height or weight on file to calculate BMI.   Physical Exam   Constitutional: Ill-appearing Psych: Minimally interactive Eyes: No scleral injection HENT: No oropharyngeal obstruction.  Marked nuchal rigidity for neck flexion but not on passive side-to-side head movement MSK: no joint deformities.  Cardiovascular: Tachycardic, perfusing extremities well Respiratory: Tachypneic GI: Soft.  No distension.  Skin: Warm dry and intact visible skin  Neurologic  Examination   Neuro: Mental Status: With sustained noxious stimulation slightly opens the right eye.  Does not follow any commands.  Nonverbal Cranial Nerves: II: No blink to threat. Pupils are equal, round, and reactive to light.   III,IV, VI/VIII: EOMI to VOR V/VII: Facial sensation is symmetric to eyelash brush VIII: No clear response to voice X/XI: Intact cough/gag XII: Unable to assess tongue protrusion secondary to  patient's mental status  Motor/Sensory: Slight withdrawal bilateral upper extremities to noxious stimulation.  Trace movement bilateral lower extremities to noxious stimulation when stimulation is applied proximally Cerebellar: Unable to assess secondary to patient's mental status   Labs/Imaging/Neurodiagnostic studies   CBC:  Recent Labs  Lab Mar 17, 2024 1810  WBC 5.8  NEUTROABS PENDING  HGB 11.4*  HCT 35.5*  MCV 89.0  PLT 144*   Basic Metabolic Panel:  Lab Results  Component Value Date   NA 138 03-17-24   K 3.7 03-17-24   CO2 19 (L) 2024-03-17   GLUCOSE 132 (H) 03/17/2024   BUN 27 (H) 03-17-2024   CREATININE 1.50 (H) 03/17/24   CALCIUM  8.2 (L) 03/17/24   GFRNONAA 48 (L) 03-17-2024   GFRAA 99 11/27/2020   Lipid Panel:  Lab Results  Component Value Date   LDLCALC 67 04/25/2019   INR  Lab Results  Component Value Date   INR 1.0 05/14/2023   APTT  Lab Results  Component Value Date   APTT 28 05/14/2023    CT Head without contrast(Personally reviewed): 1. Age related atrophy and mild to moderate periventricular white matter disease, compatible with chronic small vessel disease.  Chest x-ray: 1. Poor inspiration. 2. Minimal linear atelectasis/scarring in the left lower lung zone.  Neurodiagnostics EEG:  Pending  ASSESSMENT   ARLING CERONE is a 78 y.o. male presenting with fever and altered mental status of fairly rapid onset.  Overall clinical picture is highly concerning for possible meningitis.  Unclear if his episode  earlier today was seizure versus rigors, though the left gaze deviation certainly is concerning (appears to be resolved at this time, making a CNS ischemic event leading to the gaze deviation unlikely).  I do not think that this is NMS as the patient did have his last dose of carbidopa /levodopa  March 17, 2024 morning at ~7 AM.  Overall lower risk due to relatively low dose of medication, however in the setting of acute infection will minimize risk of developing NMS from dopamine withdrawal by utilizing NG tube  RECOMMENDATIONS  -Overnight EEG monitoring with MRI compatible leads, stat -MRI brain with and without contrast -Agree with meningitis coverage, ceftriaxone , vancomycin , ampicillin , acyclovir  -Lumbar puncture with cell counts and tubes 1 and 4, protein, glucose, meningitis/encephalitis panel, Gram stain, culture -S/p Keppra  1500 mg at 1745 on 6/20, will hold off on further antiseizure medication pending EEG results -NG tube, resume home carbidopa  levodopa , with one-time dose now -Appreciate management of comorbidities per CCM -Consider palliative care involvement in the morning due to functional baseline and comorbidities ______________________________________________________________________   Lola Jernigan MD-PhD [facility] [phone]  CRITICAL CARE Performed by: Lola LITTIE Jernigan   Total critical care time: 40 minutes  Critical care time was exclusive of separately billable procedures and treating other patients.  Critical care was necessary to treat or prevent imminent or life-threatening deterioration.  Critical care was time spent personally by me on the following activities: development of treatment plan with patient and/or surrogate as well as nursing, discussions with consultants, evaluation of patient's response to treatment, examination of patient, obtaining history from patient or surrogate, ordering and performing treatments and interventions, ordering and review  of laboratory studies, ordering and review of radiographic studies, pulse oximetry and re-evaluation of patient's condition.

## 2024-02-25 NOTE — Progress Notes (Signed)
 Elink is following code sepsis.

## 2024-02-25 NOTE — ED Notes (Signed)
 Report called to Leland Purser at [facility] ICU. JRPRN

## 2024-02-25 NOTE — H&P (Addendum)
 NAME:  Alex Frazier, MRN:  [id number], DOB:  1946-03-04, LOS: 0 ADMISSION DATE:  02/25/2024, CONSULTATION DATE:  6/19 REFERRING MD:  Dr. Randol, CHIEF COMPLAINT: Acute encephalopathy  History of Present Illness:  Patient is a 78 year old male with pertinent PMH Parkinson's disease, dementia, peripheral neuropathy, orthostatic hypotension, HLD presents to [facility] on 6/19 with AMS.  On 6/19 patient having complaints of fatigue and weakness.  Per patient's wife patient is altered but more altered than normal.  LKN 6/18 10 p.m.  EMS called finding patient had tremors and hypertensive SBP>200.  Given clonidine .  Patient also noted to be febrile 104 F.  Transferred to [facility] ED.  On arrival neuroexam showing left upward gaze deviation patient altered not following commands but moving extremities spontaneously.  Patient also with diffuse shaking.  CT head with no acute abnormality.  Concerned for possible seizure given ativan . Loaded w/ keppra . Neuro consulted. Glucose 133.  VBG 7.44, 30, 31, 20.  BP stable and tachy 110s.  Tmax 105.3 F and WBC 5.8.  Cultures obtained and placed on broad-spectrum antibiotics.  Given IV fluids.  LA 7.9 then 6.7.  UA with large leukocytes.  COVID/flu/RSV negative.  CXR unremarkable. Given AMS, elevated LA, and need for possible EEG, PCCM consulted for icu admission.  Pertinent  Medical History   Past Medical History:  Diagnosis Date   B12 deficiency    Borderline   Bulging discs    Degenerative disc disease, cervical    Dementia associated with Parkinson disease (HCC)    Gastritis    Hyperlipidemia    Neuropathy    Parkinson's disease (HCC)    Parkinsonian syndrome (HCC)    Peripheral neuropathy    TIA (transient ischemic attack)    TIA symptoms from hypotension     Significant Hospital Events: Including procedures, antibiotic start and stop dates in addition to other pertinent events   6/19 admitted w/ ams and sepsis from uti.   Interim History / Subjective:   See above  Objective    Blood pressure (!) 113/51, pulse 94, temperature (!) 103 F (39.4 C), temperature source Axillary, resp. rate (!) 25, SpO2 100%.        Intake/Output Summary (Last 24 hours) at 02/25/2024 2244 Last data filed at 02/25/2024 1922 Gross per 24 hour  Intake 2250 ml  Output --  Net 2250 ml   There were no vitals filed for this visit.  Examination: General: critically ill elderly appearing male HEENT: MM pink/dry Neuro: lethargic; minimal cough/gag; perrl; minimal response sometimes grimaces to painful stimuli CV: s1s2, RRR, no m/r/g PULM:  dim clear BS bilaterally; 4L [place] GI: soft, bsx4 active  Extremities: warm/dry, no edema; sacral decubitis and b/l heal pressure wound   Resolved problem list   Assessment and Plan   Acute encephalopathy: Possibly in setting of sepsis from UTI; possible seizure? -ct head no acute abnormality Plan: -neuro consulted; appreciate recs  -treat for possible meningitis and uti: rocephin , vanc, ampicillin , acyclovir  -will need IR consult later today for LP -limit sedating meds -check ammonia and tsh -EEG and AEDs per neuro -consider MRI -seizure precautions  Septic shock 2/2 to UTI: consider meningitis? Orthostatic hypotension -05/2023 hx of proteus uti Plan: -levo for map goal >65 -iv fluids -rocephin , vanc, ampicillin , acyclovir  -stress dose steroids -trend la -resume florinef  and midodrine  when able to take po; will likely need cor track in am -iv fluids -follow cultures  Acute respiratory failure w/ hypoxia Plan: -currently on 4L [place] sats  mid 90s -high risk for intubation -aspiration precautions -pulm toiletry  Dementia Parkinson's disease Mood disorder Plan: -resume sinemet  and prozac  when able to take po  AKI LA: likely in setting of sepsis and hypovolemia Plan: -iv fluids -Trend BMP / urinary output -Replace electrolytes as indicated -Avoid nephrotoxic agents, ensure adequate renal  perfusion  Elevated alk phosph Plan: -trend cmp  HLD Plan: -resume statin when able to take po  Anemia Thrombocytopenia Plan: -trend cbc  Sacral and b/l heal wound POA Plan: -woc consult  Best Practice (right click and Reselect all SmartList Selections daily)   Diet/type: NPO DVT prophylaxis prophylactic heparin   Pressure ulcer(s): present on admission  GI prophylaxis: N/A Lines: N/A Foley:  N/A Code Status:  full code Last date of multidisciplinary goals of care discussion [6/20 wife and daugher at bedside. State patient okay with full code but would not want to be on vent long term]  Labs   CBC: Recent Labs  Lab 02/25/24 1810  WBC 5.8  NEUTROABS 5.4  HGB 11.4*  HCT 35.5*  MCV 89.0  PLT 144*    Basic Metabolic Panel: Recent Labs  Lab 02/25/24 1529  NA 138  K 3.7  CL 105  CO2 19*  GLUCOSE 132*  BUN 27*  CREATININE 1.50*  CALCIUM  8.2*   GFR: CrCl cannot be calculated (Unknown ideal weight.). Recent Labs  Lab 02/25/24 1549 02/25/24 1804 02/25/24 1810  WBC  --   --  5.8  LATICACIDVEN 7.9* 6.7*  --     Liver Function Tests: Recent Labs  Lab 02/25/24 1529  AST 37  ALT 15  ALKPHOS 169*  BILITOT 1.6*  PROT 6.2*  ALBUMIN 3.0*   No results for input(s): LIPASE, AMYLASE in the last 168 hours. No results for input(s): AMMONIA in the last 168 hours.  ABG    Component Value Date/Time   HCO3 20.4 02/25/2024 1529   TCO2 26 03/17/2020 1608   ACIDBASEDEF 2.7 (H) 02/25/2024 1529   O2SAT 30 02/25/2024 1529     Coagulation Profile: No results for input(s): INR, PROTIME in the last 168 hours.  Cardiac Enzymes: No results for input(s): CKTOTAL, CKMB, CKMBINDEX, TROPONINI in the last 168 hours.  HbA1C: No results found for: HGBA1C  CBG: Recent Labs  Lab 02/25/24 1539  GLUCAP 116*    Review of Systems:   Patient is encephalopathic; therefore, history has been obtained from chart review.    Past Medical  History:  He,  has a past medical history of B12 deficiency, Bulging discs, Degenerative disc disease, cervical, Dementia associated with Parkinson disease (HCC), Gastritis, Hyperlipidemia, Neuropathy, Parkinson's disease (HCC), Parkinsonian syndrome (HCC), Peripheral neuropathy, and TIA (transient ischemic attack).   Surgical History:   Past Surgical History:  Procedure Laterality Date   APPENDECTOMY     BACK SURGERY       Social History:   reports that he has never smoked. He has never used smokeless tobacco. He reports that he does not drink alcohol and does not use drugs.   Family History:  His family history includes Diabetes in his sister; Heart disease in his brother; Parkinson's disease in his father and mother.   Allergies Allergies  Allergen Reactions   Tape Other (See Comments)    SKIN IS VERY THIN- TEARS VERY EASILY!!   Hydrocodone -Acetaminophen  Other (See Comments)    Hallucinations and confusion   Indomethacin Other (See Comments)    Unknown reaction   Lyrica  [Pregabalin ] Other (See Comments)    Vivid  hallucinations   Nsaids Other (See Comments)    Per family patient gets hallucination/confusion.   Quetiapine  Other (See Comments)    HYPOtension!!     Home Medications  Prior to Admission medications   Medication Sig Start Date End Date Taking? Authorizing Provider  acetaminophen  (TYLENOL ) 500 MG tablet Take 2 tablets (1,000 mg total) by mouth 3 (three) times daily. 03/22/20   Raenelle Coria, MD  Calcium  Carb-Cholecalciferol  (CALCIUM  600 + D PO) Take 1 tablet by mouth daily.    [provider]  carbidopa -levodopa  (SINEMET  IR) 25-100 MG tablet Take 2 tablets by mouth 3 (three) times daily. Patient taking differently: Take 1.5 tablets by mouth 3 (three) times daily. 12/08/23   Duanne Butler DASEN, MD  cloNIDine  (CATAPRES ) 0.1 MG tablet Take 1 tablet (0.1 mg total) by mouth every 8 (eight) hours as needed. Use as needed for systolic BP over 819. 10/05/23    Duanne Butler DASEN, MD  Cyanocobalamin  (VITAMIN B 12 PO) Take 1 tablet by mouth in the morning.     [provider]  ferrous sulfate 324 MG TBEC Take 324 mg by mouth at bedtime.    Duanne Butler DASEN, MD  fludrocortisone  (FLORINEF ) 0.1 MG tablet Take 2 tablets (200 mcg total) by mouth daily. 02/18/24   Duanne Butler DASEN, MD  FLUoxetine  (PROZAC ) 20 MG capsule Take 1 capsule (20 mg total) by mouth daily. 12/04/23   Duanne Butler DASEN, MD  midodrine  (PROAMATINE ) 10 MG tablet Take 1 tablet (10 mg total) by mouth 3 (three) times daily. Patient taking differently: Take 5 mg by mouth 3 (three) times daily. 10/30/23   Duanne Butler DASEN, MD  polyethylene glycol powder (GLYCOLAX /MIRALAX ) 17 GM/SCOOP powder Take 17 g by mouth See admin instructions. Mix 17 grams of powder into 4-8 ounces of prune juice and drink once a day    [provider]  pravastatin  (PRAVACHOL ) 20 MG tablet TAKE 1 TABLET BY MOUTH DAILY AT 6PM GENERIC EQUIVALENT FOR PRAVACHOL  10/19/23   Duanne Butler DASEN, MD  senna-docusate (SENOKOT-S) 8.6-50 MG tablet Take 2 tablets by mouth at bedtime as needed for mild constipation. 04/10/20   Duanne Butler DASEN, MD     Critical care time: 50 minutes     JD Emilio DEVONNA [facility] 02/25/2024, 10:44 PM  Please see [email or web address] for pager details.  From 7A-7P if no response, please call [phone]. After hours, please call ELink [phone].

## 2024-02-25 NOTE — ED Notes (Signed)
 Critical lab PO2 <30. MD notified

## 2024-02-25 NOTE — ED Provider Notes (Signed)
 [facility] AT [facility] Provider Note   CSN: [id number] Arrival date & time: 02/25/24  1428     History  Chief Complaint  Patient presents with   Weakness   Altered Mental Status    Alex Frazier is a 78 y.o. male with Parkinson's disease, dementia, lumbar compression fracture, autonomic dysfunction, gastritis, idiopathic polyneuropathy who presents with concern for sepsis. BIB EMS from home, c/o increased weakness and fatigue. Altered at baseline, spouse called d/t more altered than usual; 4 liters placed by EMS. Pt aggressive tremors, unable to obtain BP, 500 LR, capnography 20. She noted his BP was extremely elevated during this time to >200 systolic so she sat him forward and put a clonidine  in his mouth until he seemed to swallow it. Also reportedly had a fever of 104 F axillary earlier during that time. LKN last night 10:00 pm. Last couple of days has been weak with transitioning.  No h/o seizures.   P 120 RR 26 SpO2 87% room air CBG 130   On my evaluation of the patient he has left upward gaze deviation and is largely unresponsive. Spouse at bedside states that this is extremely abnormal for him. He doesn't ever have baseline tremors with his Parkinsons. Concern for rigoring but also active seizure activity given gaze deviation.   Past Medical History:  Diagnosis Date   B12 deficiency    Borderline   Bulging discs    Degenerative disc disease, cervical    Dementia associated with Parkinson disease (HCC)    Gastritis    Hyperlipidemia    Neuropathy    Parkinson's disease (HCC)    Parkinsonian syndrome (HCC)    Peripheral neuropathy    TIA (transient ischemic attack)    TIA symptoms from hypotension       Home Medications Prior to Admission medications   Medication Sig Start Date End Date Taking? Authorizing Provider  acetaminophen  (TYLENOL ) 500 MG tablet Take 2 tablets (1,000 mg total) by mouth 3 (three) times daily. 03/22/20    Vada Garibaldi, MD  Calcium  Carb-Cholecalciferol  (CALCIUM  600 + D PO) Take 1 tablet by mouth daily.    [provider]  carbidopa -levodopa  (SINEMET  IR) 25-100 MG tablet Take 2 tablets by mouth 3 (three) times daily. Patient taking differently: Take 1.5 tablets by mouth 3 (three) times daily. 12/08/23   Austine Lefort, MD  cloNIDine  (CATAPRES ) 0.1 MG tablet Take 1 tablet (0.1 mg total) by mouth every 8 (eight) hours as needed. Use as needed for systolic BP over 409. 10/05/23   Austine Lefort, MD  Cyanocobalamin  (VITAMIN B 12 PO) Take 1 tablet by mouth in the morning.     [provider]  ferrous sulfate 324 MG TBEC Take 324 mg by mouth at bedtime.    Austine Lefort, MD  fludrocortisone  (FLORINEF ) 0.1 MG tablet Take 2 tablets (200 mcg total) by mouth daily. 02/18/24   Austine Lefort, MD  FLUoxetine  (PROZAC ) 20 MG capsule Take 1 capsule (20 mg total) by mouth daily. 12/04/23   Austine Lefort, MD  midodrine  (PROAMATINE ) 10 MG tablet Take 1 tablet (10 mg total) by mouth 3 (three) times daily. Patient taking differently: Take 5 mg by mouth 3 (three) times daily. 10/30/23   Austine Lefort, MD  polyethylene glycol powder (GLYCOLAX /MIRALAX ) 17 GM/SCOOP powder Take 17 g by mouth See admin instructions. Mix 17 grams of powder into 4-8 ounces of prune juice and drink once a day  [provider]  pravastatin  (PRAVACHOL ) 20 MG tablet TAKE 1 TABLET BY MOUTH DAILY AT 6PM GENERIC EQUIVALENT FOR PRAVACHOL  10/19/23   Austine Lefort, MD  senna-docusate (SENOKOT-S) 8.6-50 MG tablet Take 2 tablets by mouth at bedtime as needed for mild constipation. 04/10/20   Austine Lefort, MD      Allergies    Tape, Hydrocodone -acetaminophen , Indomethacin, Lyrica  [pregabalin ], Nsaids, and Quetiapine     Review of Systems   Review of Systems A 10 point review of systems was performed and is negative unless otherwise reported in HPI.  Physical Exam Updated Vital Signs BP 95/70 (BP  Location: Left Arm)   Pulse (!) 112   Temp 98.4 F (36.9 C) (Oral)   Resp (!) 32   SpO2 100%  Physical Exam General: Ill-appearing male, lying in bed.  HEENT: PERRLA, L gaze deviation, Sclera anicteric, dry mucous membranes, edentulous, trachea midline.  Cardiology: regular tachycardic rate, no murmurs/rubs/gallops.  Resp: Mild tachypnea. CTAB, no wheezes, rhonchi, crackles.  Abd: Soft, non-tender, non-distended. No rebound tenderness or guarding.  GU: Deferred. MSK: No peripheral edema or signs of trauma. Extremities without deformity or TTP. No cyanosis or clubbing. Skin: warm, dry. Neuro: Unresponsive to questions, eyes open, intermittently moves all extremities to pain, no facial droop, L gaze deviation noted, diffuse shaking that is at times rhythmic  ED Results / Procedures / Treatments   Labs (all labs ordered are listed, but only abnormal results are displayed) Labs Reviewed  BRAIN NATRIURETIC PEPTIDE - Abnormal; Notable for the following components:      Result Value   B Natriuretic Peptide 215.1 (*)    All other components within normal limits  COMPREHENSIVE METABOLIC PANEL WITH GFR - Abnormal; Notable for the following components:   CO2 19 (*)    Glucose, Bld 132 (*)    BUN 27 (*)    Creatinine, Ser 1.50 (*)    Calcium  8.2 (*)    Total Protein 6.2 (*)    Albumin 3.0 (*)    Alkaline Phosphatase 169 (*)    Total Bilirubin 1.6 (*)    GFR, Estimated 48 (*)    All other components within normal limits  BLOOD GAS, VENOUS - Abnormal; Notable for the following components:   pH, Ven 7.44 (*)    pCO2, Ven 30 (*)    pO2, Ven <31 (*)    Acid-base deficit 2.7 (*)    All other components within normal limits  CBG MONITORING, ED - Abnormal; Notable for the following components:   Glucose-Capillary 116 (*)    All other components within normal limits  I-STAT CG4 LACTIC ACID, ED - Abnormal; Notable for the following components:   Lactic Acid, Venous 7.9 (*)    All other  components within normal limits  RESP PANEL BY RT-PCR (RSV, FLU A&B, COVID)  RVPGX2  CULTURE, BLOOD (ROUTINE X 2)  CULTURE, BLOOD (ROUTINE X 2)  CBC WITH DIFFERENTIAL/PLATELET  URINALYSIS, W/ REFLEX TO CULTURE (INFECTION SUSPECTED)  PROTIME-INR  I-STAT CG4 LACTIC ACID, ED    EKG None  Radiology CT Head Wo Contrast Result Date: 02/25/2024 CLINICAL DATA:  Seizure, new-onset, no history of trauma EXAM: CT HEAD WITHOUT CONTRAST TECHNIQUE: Contiguous axial images were obtained from the base of the skull through the vertex without intravenous contrast. RADIATION DOSE REDUCTION: This exam was performed according to the departmental dose-optimization program which includes automated exposure control, adjustment of the mA and/or kV according to patient size and/or use of iterative reconstruction technique. COMPARISON:  CT of the head dated May 14, 2023. FINDINGS: Brain: Generalized cerebral volume loss. Mild-to-moderate periventricular white matter disease. No evidence of hemorrhage, mass, cortical infarct or hydrocephalus. Vascular: Calcific atheromatous disease within the carotid siphons. Skull: Intact and unremarkable. Sinuses/Orbits: Clear paranasal sinuses. Status post bilateral lens replacement. Other: None. IMPRESSION: 1. Age related atrophy and mild to moderate periventricular white matter disease, compatible with chronic small vessel disease. Electronically Signed   By: Maribeth Shivers M.D.   On: 02/25/2024 16:29   DG Chest Portable 1 View Result Date: 02/25/2024 CLINICAL DATA:  Hypoxia. EXAM: PORTABLE CHEST 1 VIEW COMPARISON:  05/14/2023 FINDINGS: Normal sized heart. Poor inspiration. Minimal linear atelectasis/scarring in the left lower lung zone. Normal vasculature. No pleural fluid. Diffuse osteopenia. IMPRESSION: 1. Poor inspiration. 2. Minimal linear atelectasis/scarring in the left lower lung zone. Electronically Signed   By: Catherin Closs M.D.   On: 02/25/2024 16:09     Procedures .Critical Care  Performed by: Merdis Stalling, MD Authorized by: Merdis Stalling, MD   Critical care provider statement:    Critical care time (minutes):  60   Critical care was necessary to treat or prevent imminent or life-threatening deterioration of the following conditions:  Sepsis, shock and CNS failure or compromise   Critical care was time spent personally by me on the following activities:  Development of treatment plan with patient or surrogate, discussions with consultants, evaluation of patient's response to treatment, examination of patient, ordering and review of laboratory studies, ordering and review of radiographic studies, ordering and performing treatments and interventions, pulse oximetry, re-evaluation of patient's condition, review of old charts and obtaining history from patient or surrogate   Care discussed with: admitting provider       Medications Ordered in ED Medications  LORazepam (ATIVAN) injection 4 mg ( Intravenous Not Given 02/25/24 1536)  piperacillin-tazobactam (ZOSYN) IVPB 3.375 g (has no administration in time range)  LORazepam (ATIVAN) injection 2 mg (2 mg Intravenous Given 02/25/24 1533)  LORazepam (ATIVAN) 2 MG/ML injection (2 mg  Given 02/25/24 1547)  vancomycin (VANCOCIN) IVPB 1000 mg/200 mL premix (1,000 mg Intravenous New Bag/Given 02/25/24 1615)  lactated ringers  bolus 1,000 mL (1,000 mLs Intravenous New Bag/Given 02/25/24 1612)    ED Course/ Medical Decision Making/ A&P                          Medical Decision Making Amount and/or Complexity of Data Reviewed Labs: ordered. Decision-making details documented in ED Course. Radiology: ordered. Decision-making details documented in ED Course.  Risk Prescription drug management. Decision regarding hospitalization.    This patient presents to the ED for concern of AMS, tremoring, , this involves an extensive number of treatment options, and is a complaint that carries with it a  high risk of complications and morbidity.  I considered the following differential and admission for this acute, potentially life threatening condition. Tachycardic, tachypneic, febrile, and hypotensive. +septic shock/severe sepsis. Ordered fluids, blood cultures, abx, lactic.   MDM:    Ddx of acute altered mental status or encephalopathy considered but not limited to: -Intracranial abnormalities such as ICH, hydrocephalus, head trauma - no reported head trauma; CTH reassuringly neg -Infection such as UTI, PNA - no PNA on CXR, UA pending, viral panel neg -No significant electrolyte abnormalities or hyper/hypoglycemia -No significant hypercarbia or hypoxia Clinical Course as of 02/25/24 1808  Thu Feb 25, 2024  1537 Given 4mg  IV ativan with significant improvement in his shaking.  Eyes not deviated to L now and now moving around but not tracking. He is sedated but responsive to painful stimuli. Discussed with wife who states that she would be okay with intubation if patient became too sedated.   Shaking was not consistently rhythmic, patient did seem to somewhat respond to extenal stimulation, reassuring against seizure and favoring rigoring, however rigoring doesn't explain left gaze deviation which improved with ativan. Will need EEG to differentiate. Will send to CT scanner and then consult with neurology. He is febrile to 102F and will give rectal tylenol .  Ordered vanc/zosyn for sepsis as well.  [HN]  1539 pCO2, Ven(!): 30 [HN]  1539 pH, Ven(!): 7.44 Mild acute respiratory alkalosis [HN]  1549 Glucose-Capillary(!): 116 [HN]  1549 Discussed with CT techs about taking patient back for [facility] [HN]  1555 Lactic Acid, Venous(!!): 7.9 +elevated lactic, receiving fluids now. Also had hypotensive BPs into 70s, improved to 80s systolic with fluids on pressure bag.  [HN]  1555 Likely septic shock +/- seizure activity. [HN]  1703 Resp panel by RT-PCR (RSV, Flu A&B, Covid) Anterior Nasal Swab neg [HN]   1703 CT Head Wo Contrast 1. Age related atrophy and mild to moderate periventricular white matter disease, compatible with chronic small vessel disease.   [HN]  1723 D/w neurology who states that any tremoring, whether from rigoring or seizure, will improve with ativan, but agrees about concern for left-gaze deviation, recommends keppra 1500 mg and [facility]. [HN]  1724 Creatinine(!): 1.50 +AKI [HN]  1728 Responsive to painful stimuli, protecting airway. Awaiting labs then admission [HN]  1806 Temp(!): 105.3 F (40.7 C) rectal [HN]  1806 Giving rectal tylenol  [HN]    Clinical Course User Index [HN] Merdis Stalling, MD    Labs: I Ordered, and personally interpreted labs.  The pertinent results include: Those listed above  Imaging Studies ordered: I ordered imaging studies including CTH, chest x-ray I independently visualized and interpreted imaging. I agree with the radiologist interpretation  Additional history obtained from chart review, wife and daughter at bedside.    Cardiac Monitoring: The patient was maintained on a cardiac monitor.  I personally viewed and interpreted the cardiac monitored which showed an underlying rhythm of: Sinus tachycardia  Reevaluation: After the interventions noted above, I reevaluated the patient and found that they have :improved  Social Determinants of Health:  lives with wife  Disposition:  Patient is signed out to the oncoming ED physician Dr. Monnie Anthony who is made aware of his history, presentation, exam, workup, and plan. Plan is to complete labs and admit.   Co morbidities that complicate the patient evaluation  Past Medical History:  Diagnosis Date   B12 deficiency    Borderline   Bulging discs    Degenerative disc disease, cervical    Dementia associated with Parkinson disease (HCC)    Gastritis    Hyperlipidemia    Neuropathy    Parkinson's disease (HCC)    Parkinsonian syndrome (HCC)    Peripheral  neuropathy    TIA (transient ischemic attack)    TIA symptoms from hypotension     Medicines Meds ordered this encounter  Medications   LORazepam (ATIVAN) injection 2 mg   LORazepam (ATIVAN) injection 4 mg   LORazepam (ATIVAN) 2 MG/ML injection    Rimando, Junior Cesa: cabinet override   DISCONTD: piperacillin-tazobactam (ZOSYN) IVPB 3.375 g    Antibiotic Indication::   Sepsis   vancomycin (VANCOCIN) IVPB 1000 mg/200 mL premix  Indication::   Sepsis   lactated ringers  bolus 1,000 mL    Reason 30 mL/kg dose is not being ordered:   Fluid given earlier or by EMS - completing remainder of 30 mL/kg by TBW   piperacillin-tazobactam (ZOSYN) IVPB 3.375 g    Antibiotic Indication::   Sepsis    I have reviewed the patients home medicines and have made adjustments as needed  Problem List / ED Course: Problem List Items Addressed This Visit   None Visit Diagnoses       Septic shock (HCC)    -  Primary   Relevant Medications   vancomycin (VANCOCIN) IVPB 1000 mg/200 mL premix (Completed)     Witnessed seizure-like activity (HCC)         AKI (acute kidney injury) (HCC)                       This note was created using dictation software, which may contain spelling or grammatical errors.    Merdis Stalling, MD 02/25/24 [phone]

## 2024-02-26 ENCOUNTER — Inpatient Hospital Stay (HOSPITAL_COMMUNITY)

## 2024-02-26 ENCOUNTER — Encounter (HOSPITAL_COMMUNITY): Payer: Self-pay

## 2024-02-26 ENCOUNTER — Other Ambulatory Visit (HOSPITAL_COMMUNITY)

## 2024-02-26 DIAGNOSIS — R509 Fever, unspecified: Secondary | ICD-10-CM

## 2024-02-26 DIAGNOSIS — G9341 Metabolic encephalopathy: Secondary | ICD-10-CM | POA: Diagnosis not present

## 2024-02-26 DIAGNOSIS — Z0389 Encounter for observation for other suspected diseases and conditions ruled out: Secondary | ICD-10-CM | POA: Diagnosis not present

## 2024-02-26 DIAGNOSIS — G039 Meningitis, unspecified: Secondary | ICD-10-CM

## 2024-02-26 DIAGNOSIS — R9089 Other abnormal findings on diagnostic imaging of central nervous system: Secondary | ICD-10-CM | POA: Diagnosis not present

## 2024-02-26 DIAGNOSIS — R6889 Other general symptoms and signs: Secondary | ICD-10-CM

## 2024-02-26 DIAGNOSIS — I10 Essential (primary) hypertension: Secondary | ICD-10-CM

## 2024-02-26 DIAGNOSIS — I639 Cerebral infarction, unspecified: Secondary | ICD-10-CM | POA: Diagnosis not present

## 2024-02-26 DIAGNOSIS — R739 Hyperglycemia, unspecified: Secondary | ICD-10-CM

## 2024-02-26 DIAGNOSIS — R6521 Severe sepsis with septic shock: Secondary | ICD-10-CM | POA: Diagnosis not present

## 2024-02-26 DIAGNOSIS — Q048 Other specified congenital malformations of brain: Secondary | ICD-10-CM | POA: Diagnosis not present

## 2024-02-26 DIAGNOSIS — E43 Unspecified severe protein-calorie malnutrition: Secondary | ICD-10-CM | POA: Insufficient documentation

## 2024-02-26 DIAGNOSIS — R569 Unspecified convulsions: Secondary | ICD-10-CM | POA: Diagnosis not present

## 2024-02-26 DIAGNOSIS — A419 Sepsis, unspecified organism: Secondary | ICD-10-CM | POA: Diagnosis not present

## 2024-02-26 HISTORY — PX: IR NEPHROSTOMY PLACEMENT RIGHT: IMG6064

## 2024-02-26 LAB — CBC WITH DIFFERENTIAL/PLATELET
Abs Immature Granulocytes: 0.58 10*3/uL — ABNORMAL HIGH (ref 0.00–0.07)
Basophils Absolute: 0.1 10*3/uL (ref 0.0–0.1)
Basophils Relative: 0 %
Eosinophils Absolute: 0 10*3/uL (ref 0.0–0.5)
Eosinophils Relative: 0 %
HCT: 36.6 % — ABNORMAL LOW (ref 39.0–52.0)
Hemoglobin: 12 g/dL — ABNORMAL LOW (ref 13.0–17.0)
Immature Granulocytes: 3 %
Lymphocytes Relative: 2 %
Lymphs Abs: 0.3 10*3/uL — ABNORMAL LOW (ref 0.7–4.0)
MCH: 28.4 pg (ref 26.0–34.0)
MCHC: 32.8 g/dL (ref 30.0–36.0)
MCV: 86.5 fL (ref 80.0–100.0)
Monocytes Absolute: 0.7 10*3/uL (ref 0.1–1.0)
Monocytes Relative: 3 %
Neutro Abs: 18.9 10*3/uL — ABNORMAL HIGH (ref 1.7–7.7)
Neutrophils Relative %: 92 %
Platelets: 140 10*3/uL — ABNORMAL LOW (ref 150–400)
RBC: 4.23 MIL/uL (ref 4.22–5.81)
RDW: 14.6 % (ref 11.5–15.5)
Smear Review: NORMAL
WBC: 20.5 10*3/uL — ABNORMAL HIGH (ref 4.0–10.5)
nRBC: 0 % (ref 0.0–0.2)

## 2024-02-26 LAB — COMPREHENSIVE METABOLIC PANEL WITH GFR
ALT: 24 U/L (ref 0–44)
AST: 93 U/L — ABNORMAL HIGH (ref 15–41)
Albumin: 2.3 g/dL — ABNORMAL LOW (ref 3.5–5.0)
Alkaline Phosphatase: 110 U/L (ref 38–126)
Anion gap: 9 (ref 5–15)
BUN: 34 mg/dL — ABNORMAL HIGH (ref 8–23)
CO2: 20 mmol/L — ABNORMAL LOW (ref 22–32)
Calcium: 7.8 mg/dL — ABNORMAL LOW (ref 8.9–10.3)
Chloride: 108 mmol/L (ref 98–111)
Creatinine, Ser: 2.36 mg/dL — ABNORMAL HIGH (ref 0.61–1.24)
GFR, Estimated: 28 mL/min — ABNORMAL LOW (ref >60)
Glucose, Bld: 123 mg/dL — ABNORMAL HIGH (ref 70–99)
Potassium: 3.8 mmol/L (ref 3.5–5.1)
Sodium: 137 mmol/L (ref 135–145)
Total Bilirubin: 0.9 mg/dL (ref 0.0–1.2)
Total Protein: 5.3 g/dL — ABNORMAL LOW (ref 6.5–8.1)

## 2024-02-26 LAB — BLOOD CULTURE ID PANEL (REFLEXED) - BCID2
A.calcoaceticus-baumannii: NOT DETECTED
Bacteroides fragilis: NOT DETECTED
CTX-M ESBL: NOT DETECTED
Candida albicans: NOT DETECTED
Candida auris: NOT DETECTED
Candida glabrata: NOT DETECTED
Candida krusei: NOT DETECTED
Candida parapsilosis: NOT DETECTED
Candida tropicalis: NOT DETECTED
Carbapenem resist OXA 48 LIKE: NOT DETECTED
Carbapenem resistance IMP: NOT DETECTED
Carbapenem resistance KPC: NOT DETECTED
Carbapenem resistance NDM: NOT DETECTED
Carbapenem resistance VIM: NOT DETECTED
Cryptococcus neoformans/gattii: NOT DETECTED
Enterobacter cloacae complex: NOT DETECTED
Enterobacterales: DETECTED — AB
Enterococcus Faecium: NOT DETECTED
Enterococcus faecalis: NOT DETECTED
Escherichia coli: NOT DETECTED
Haemophilus influenzae: NOT DETECTED
Klebsiella aerogenes: NOT DETECTED
Klebsiella oxytoca: NOT DETECTED
Klebsiella pneumoniae: NOT DETECTED
Listeria monocytogenes: NOT DETECTED
Methicillin resistance mecA/C: DETECTED — AB
Neisseria meningitidis: NOT DETECTED
Proteus species: DETECTED — AB
Pseudomonas aeruginosa: NOT DETECTED
Salmonella species: NOT DETECTED
Serratia marcescens: NOT DETECTED
Staphylococcus aureus (BCID): NOT DETECTED
Staphylococcus epidermidis: DETECTED — AB
Staphylococcus lugdunensis: NOT DETECTED
Staphylococcus species: DETECTED — AB
Stenotrophomonas maltophilia: NOT DETECTED
Streptococcus agalactiae: NOT DETECTED
Streptococcus pneumoniae: NOT DETECTED
Streptococcus pyogenes: NOT DETECTED
Streptococcus species: NOT DETECTED

## 2024-02-26 LAB — POCT I-STAT EG7
Acid-base deficit: 7 mmol/L — ABNORMAL HIGH (ref 0.0–2.0)
Bicarbonate: 16.5 mmol/L — ABNORMAL LOW (ref 20.0–28.0)
Calcium, Ion: 1.08 mmol/L — ABNORMAL LOW (ref 1.15–1.40)
HCT: 35 % — ABNORMAL LOW (ref 39.0–52.0)
Hemoglobin: 11.9 g/dL — ABNORMAL LOW (ref 13.0–17.0)
O2 Saturation: 66 %
Patient temperature: 102.3
Potassium: 3.7 mmol/L (ref 3.5–5.1)
Sodium: 136 mmol/L (ref 135–145)
TCO2: 17 mmol/L — ABNORMAL LOW (ref 22–32)
pCO2, Ven: 28.9 mmHg — ABNORMAL LOW (ref 44–60)
pH, Ven: 7.374 (ref 7.25–7.43)
pO2, Ven: 39 mmHg (ref 32–45)

## 2024-02-26 LAB — GLUCOSE, CAPILLARY
Glucose-Capillary: 114 mg/dL — ABNORMAL HIGH (ref 70–99)
Glucose-Capillary: 121 mg/dL — ABNORMAL HIGH (ref 70–99)
Glucose-Capillary: 92 mg/dL (ref 70–99)
Glucose-Capillary: 96 mg/dL (ref 70–99)

## 2024-02-26 LAB — PHOSPHORUS: Phosphorus: 3.5 mg/dL (ref 2.5–4.6)

## 2024-02-26 LAB — LACTIC ACID, PLASMA
Lactic Acid, Venous: 3 mmol/L (ref 0.5–1.9)
Lactic Acid, Venous: 3.2 mmol/L (ref 0.5–1.9)
Lactic Acid, Venous: 3.5 mmol/L (ref 0.5–1.9)

## 2024-02-26 LAB — SODIUM, URINE, RANDOM: Sodium, Ur: 44 mmol/L

## 2024-02-26 LAB — CREATININE, URINE, RANDOM: Creatinine, Urine: 78 mg/dL

## 2024-02-26 LAB — TSH: TSH: 1.359 u[IU]/mL (ref 0.350–4.500)

## 2024-02-26 LAB — CK: Total CK: 2060 U/L — ABNORMAL HIGH (ref 49–397)

## 2024-02-26 LAB — STREP PNEUMONIAE URINARY ANTIGEN: Strep Pneumo Urinary Antigen: NEGATIVE

## 2024-02-26 LAB — VITAMIN B12: Vitamin B-12: 1056 pg/mL — ABNORMAL HIGH (ref 180–914)

## 2024-02-26 LAB — CORTISOL: Cortisol, Plasma: >100 ug/dL

## 2024-02-26 LAB — HIV ANTIBODY (ROUTINE TESTING W REFLEX): HIV Screen 4th Generation wRfx: NONREACTIVE

## 2024-02-26 LAB — PROCALCITONIN: Procalcitonin: >150 ng/mL

## 2024-02-26 LAB — PROTIME-INR
INR: 1.3 — ABNORMAL HIGH (ref 0.8–1.2)
Prothrombin Time: 16.8 s — ABNORMAL HIGH (ref 11.4–15.2)

## 2024-02-26 LAB — MRSA NEXT GEN BY PCR, NASAL: MRSA by PCR Next Gen: NOT DETECTED

## 2024-02-26 LAB — MAGNESIUM: Magnesium: 1.6 mg/dL — ABNORMAL LOW (ref 1.7–2.4)

## 2024-02-26 LAB — C-REACTIVE PROTEIN: CRP: 17.9 mg/dL — ABNORMAL HIGH (ref <1.0)

## 2024-02-26 LAB — AMMONIA: Ammonia: 32 umol/L (ref 9–35)

## 2024-02-26 MED ORDER — SODIUM CHLORIDE 0.9 % IV SOLN
2.0000 g | Freq: Two times a day (BID) | INTRAVENOUS | Status: DC
  Administered 2024-02-26 – 2024-02-27 (×3): 2 g via INTRAVENOUS
  Filled 2024-02-26 (×3): qty 20

## 2024-02-26 MED ORDER — SODIUM CHLORIDE 0.9 % IV SOLN
2.0000 g | Freq: Three times a day (TID) | INTRAVENOUS | Status: DC
  Administered 2024-02-26 – 2024-02-27 (×3): 2 g via INTRAVENOUS
  Filled 2024-02-26 (×3): qty 2000

## 2024-02-26 MED ORDER — OSMOLITE 1.5 CAL PO LIQD
1000.0000 mL | ORAL | Status: DC
  Administered 2024-02-28 – 2024-03-04 (×3): 1000 mL
  Filled 2024-02-26: qty 1000

## 2024-02-26 MED ORDER — MAGNESIUM SULFATE 2 GM/50ML IV SOLN
2.0000 g | Freq: Once | INTRAVENOUS | Status: AC
  Administered 2024-02-26: 2 g via INTRAVENOUS
  Filled 2024-02-26: qty 50

## 2024-02-26 MED ORDER — DOCUSATE SODIUM 50 MG/5ML PO LIQD: 100.0000 mg | Freq: Two times a day (BID) | ORAL | Status: DC | PRN

## 2024-02-26 MED ORDER — SODIUM CHLORIDE 0.9 % IV SOLN
250.0000 mL | INTRAVENOUS | Status: AC
  Administered 2024-02-26: 250 mL via INTRAVENOUS

## 2024-02-26 MED ORDER — ADULT MULTIVITAMIN W/MINERALS CH
1.0000 | ORAL_TABLET | Freq: Every day | ORAL | Status: DC
  Administered 2024-02-26 – 2024-03-04 (×8): 1
  Filled 2024-02-26 (×8): qty 1

## 2024-02-26 MED ORDER — PROSOURCE TF20 ENFIT COMPATIBL EN LIQD
60.0000 mL | Freq: Every day | ENTERAL | Status: DC
  Administered 2024-02-26 – 2024-03-04 (×8): 60 mL
  Filled 2024-02-26 (×8): qty 60

## 2024-02-26 MED ORDER — VANCOMYCIN VARIABLE DOSE PER UNSTABLE RENAL FUNCTION (PHARMACIST DOSING): Status: DC

## 2024-02-26 MED ORDER — LACTATED RINGERS IV SOLN: INTRAVENOUS | Status: AC

## 2024-02-26 MED ORDER — SODIUM CHLORIDE 0.9 % IV SOLN
2.0000 g | Freq: Four times a day (QID) | INTRAVENOUS | Status: DC
  Administered 2024-02-26 (×2): 2 g via INTRAVENOUS
  Filled 2024-02-26 (×2): qty 2000

## 2024-02-26 MED ORDER — POLYETHYLENE GLYCOL 3350 17 G PO PACK
17.0000 g | PACK | Freq: Every day | ORAL | Status: DC | PRN
  Administered 2024-02-29: 17 g
  Filled 2024-02-26 (×2): qty 1

## 2024-02-26 MED ORDER — HYDROCORTISONE SOD SUC (PF) 100 MG IJ SOLR
100.0000 mg | Freq: Every day | INTRAMUSCULAR | Status: DC
  Administered 2024-02-27 – 2024-02-28 (×2): 100 mg via INTRAVENOUS
  Filled 2024-02-26 (×2): qty 2

## 2024-02-26 MED ORDER — NOREPINEPHRINE 4 MG/250ML-% IV SOLN
0.0000 ug/min | INTRAVENOUS | Status: DC
  Administered 2024-02-27: 1 ug/min via INTRAVENOUS
  Filled 2024-02-26 (×2): qty 250

## 2024-02-26 MED ORDER — DEXTROSE 5 % IV SOLN
600.0000 mg | INTRAVENOUS | Status: DC
  Administered 2024-02-27: 600 mg via INTRAVENOUS
  Filled 2024-02-26: qty 12

## 2024-02-26 MED ORDER — DEXAMETHASONE SODIUM PHOSPHATE 10 MG/ML IJ SOLN: 10.0000 mg | Freq: Four times a day (QID) | INTRAMUSCULAR | Status: DC

## 2024-02-26 MED ORDER — CARBIDOPA-LEVODOPA 25-250 MG PO TABS
1.5000 | ORAL_TABLET | Freq: Three times a day (TID) | ORAL | Status: DC
  Filled 2024-02-26: qty 2

## 2024-02-26 MED ORDER — VANCOMYCIN HCL 500 MG/100ML IV SOLN
500.0000 mg | Freq: Once | INTRAVENOUS | Status: AC
  Administered 2024-02-26: 500 mg via INTRAVENOUS
  Filled 2024-02-26: qty 100

## 2024-02-26 MED ORDER — FENTANYL CITRATE (PF) 100 MCG/2ML IJ SOLN
INTRAMUSCULAR | Status: AC
  Filled 2024-02-26: qty 2

## 2024-02-26 MED ORDER — LIDOCAINE-EPINEPHRINE 1 %-1:100000 IJ SOLN
INTRAMUSCULAR | Status: AC
  Filled 2024-02-26: qty 1

## 2024-02-26 MED ORDER — THIAMINE MONONITRATE 100 MG PO TABS
100.0000 mg | ORAL_TABLET | Freq: Every day | ORAL | Status: AC
  Administered 2024-02-26 – 2024-03-03 (×7): 100 mg
  Filled 2024-02-26 (×7): qty 1

## 2024-02-26 MED ORDER — CARBIDOPA-LEVODOPA 25-100 MG PO TABS
1.5000 | ORAL_TABLET | Freq: Three times a day (TID) | ORAL | Status: DC
  Administered 2024-02-26 – 2024-03-04 (×23): 1.5 via NASOGASTRIC
  Filled 2024-02-26 (×25): qty 1.5

## 2024-02-26 MED ORDER — MIDAZOLAM HCL 2 MG/2ML IJ SOLN
INTRAMUSCULAR | Status: AC
  Filled 2024-02-26: qty 2

## 2024-02-26 MED ORDER — MIDODRINE HCL 5 MG PO TABS
10.0000 mg | ORAL_TABLET | Freq: Three times a day (TID) | ORAL | Status: DC
  Administered 2024-02-26 – 2024-03-01 (×12): 10 mg
  Filled 2024-02-26 (×12): qty 2

## 2024-02-26 MED ORDER — GADOBUTROL 1 MMOL/ML IV SOLN
6.0000 mL | Freq: Once | INTRAVENOUS | Status: AC | PRN
  Administered 2024-02-26: 6 mL via INTRAVENOUS

## 2024-02-26 MED ORDER — HYDROCORTISONE SOD SUC (PF) 100 MG IJ SOLR
100.0000 mg | Freq: Two times a day (BID) | INTRAMUSCULAR | Status: DC
  Administered 2024-02-26: 100 mg via INTRAVENOUS
  Filled 2024-02-26: qty 2

## 2024-02-26 MED ORDER — MIDODRINE HCL 5 MG PO TABS
10.0000 mg | ORAL_TABLET | Freq: Three times a day (TID) | ORAL | Status: DC
  Administered 2024-02-26: 10 mg via ORAL
  Filled 2024-02-26: qty 2

## 2024-02-26 MED ORDER — LACTATED RINGERS IV SOLN: INTRAVENOUS | Status: DC

## 2024-02-26 MED ORDER — FLUDROCORTISONE ACETATE 0.1 MG PO TABS
200.0000 ug | ORAL_TABLET | Freq: Every day | ORAL | Status: DC
  Administered 2024-02-26 – 2024-03-04 (×8): 200 ug
  Filled 2024-02-26 (×8): qty 2

## 2024-02-26 MED ORDER — LACTATED RINGERS IV BOLUS
1000.0000 mL | Freq: Once | INTRAVENOUS | Status: AC
  Administered 2024-02-26: 1000 mL via INTRAVENOUS

## 2024-02-26 MED ORDER — DEXTROSE 5 % IV SOLN
600.0000 mg | Freq: Two times a day (BID) | INTRAVENOUS | Status: DC
  Administered 2024-02-26: 600 mg via INTRAVENOUS
  Filled 2024-02-26 (×2): qty 12

## 2024-02-26 MED ORDER — NOREPINEPHRINE 4 MG/250ML-% IV SOLN
INTRAVENOUS | Status: AC
  Administered 2024-02-26: 5 ug/min via INTRAVENOUS
  Filled 2024-02-26: qty 250

## 2024-02-26 MED ORDER — HEPARIN SODIUM (PORCINE) 5000 UNIT/ML IJ SOLN
5000.0000 [IU] | Freq: Three times a day (TID) | INTRAMUSCULAR | Status: DC
  Administered 2024-02-27 – 2024-03-04 (×20): 5000 [IU] via SUBCUTANEOUS
  Filled 2024-02-26 (×21): qty 1

## 2024-02-26 MED ORDER — ACETAMINOPHEN 10 MG/ML IV SOLN
1000.0000 mg | Freq: Three times a day (TID) | INTRAVENOUS | Status: AC | PRN
  Administered 2024-02-26: 1000 mg via INTRAVENOUS
  Filled 2024-02-26: qty 100

## 2024-02-26 MED ORDER — HEPARIN SODIUM (PORCINE) 5000 UNIT/ML IJ SOLN: 5000.0000 [IU] | Freq: Three times a day (TID) | INTRAMUSCULAR | Status: DC

## 2024-02-26 MED ORDER — IOHEXOL 300 MG/ML  SOLN
50.0000 mL | Freq: Once | INTRAMUSCULAR | Status: AC | PRN
  Administered 2024-02-26: 10 mL

## 2024-02-26 MED ORDER — ZINC OXIDE 40 % EX OINT
TOPICAL_OINTMENT | Freq: Two times a day (BID) | CUTANEOUS | Status: DC
  Administered 2024-03-01: 2 via TOPICAL
  Administered 2024-03-03 – 2024-03-04 (×3): 1 via TOPICAL
  Filled 2024-02-26 (×2): qty 57

## 2024-02-26 MED ORDER — LIDOCAINE-EPINEPHRINE 1 %-1:100000 IJ SOLN
10.0000 mL | Freq: Once | INTRAMUSCULAR | Status: AC
  Administered 2024-02-26: 10 mL

## 2024-02-26 NOTE — Procedures (Signed)
  Procedure:  Right perc nephrostomy catheter placement 78f Preprocedure diagnosis: The primary encounter diagnosis was Septic shock (HCC). Diagnoses of Witnessed seizure-like activity (HCC), AKI (acute kidney injury) (HCC), and Pyelonephritis were also pertinent to this visit. Postprocedure diagnosis: same EBL:    minimal Complications:   none immediate  See full dictation in [employer].  Nicky Barrack MD Main # [phone] Pager  [phone] Mobile [phone]

## 2024-02-26 NOTE — Progress Notes (Signed)
 Pharmacy Antibiotic Note  Alex Frazier is a 78 y.o. male admitted on 02/25/2024 with persistent encephalopathic lactic acidosis/with concerns for meningitis. Pharmacy has been consulted for meningitis antibiotic dosing.  Renal function worsening, now afebrile, WBC up to 20.5 (steroid), PCT > 150.  Plan: No standing vanc - check random level in AM Reduce Acyclovir to 600mg  IV Q24H - LR 150/hr Reduce amp 2gm IV Q8H Continue Rocephin  2g IV Q12H Monitor renal fxn, clinical progress, vanc levels as indicated F/u LP   Weight: 60.7 kg (133 lb 13.1 oz)  Temp (24hrs), Avg:101 F (38.3 C), Min:95.7 F (35.4 C), Max:105.3 F (40.7 C)  Recent Labs  Lab 02/25/24 1529 02/25/24 1549 02/25/24 1804 02/25/24 1810 02/26/24 0522  WBC  --   --   --  5.8 20.5*  CREATININE 1.50*  --   --   --  2.36*  LATICACIDVEN  --  7.9* 6.7*  --  3.0*    Estimated Creatinine Clearance: 22.5 mL/min (A) (by C-G formula based on SCr of 2.36 mg/dL (H)).    Allergies  Allergen Reactions   Tape Other (See Comments)    SKIN IS VERY THIN- TEARS VERY EASILY!!   Hydrocodone -Acetaminophen  Other (See Comments)    Hallucinations and confusion   Indomethacin Other (See Comments)    Unknown reaction   Lyrica  [Pregabalin ] Other (See Comments)    Vivid hallucinations   Nsaids Other (See Comments)    Per family patient gets hallucination/confusion.   Quetiapine  Other (See Comments)    HYPOtension!!    Acyclovir 6/20 >>  Amp 6/20 >>  Ceftriaxone  6/20 >> Vanc 6/19 >>   6/20 Vanc 1500mg  (2 divided doses, 1000mg  + 500mg )    6/19 BCx -  6/19 UCx -  6/19 MRSA PCR - negative  [name], PharmD, BCPS, BCCCP 02/26/2024, 8:27 AM

## 2024-02-26 NOTE — Progress Notes (Signed)
 PHARMACY - PHYSICIAN COMMUNICATION CRITICAL VALUE ALERT - BLOOD CULTURE IDENTIFICATION (BCID)  Alex Frazier is an 78 y.o. male who presented to [facility] on 02/25/2024 with AMS.  Assessment:  Currently being covered for meningitis and urosepsis.  BCx growing Proteus and MRSE.  Patient is febrile with leukocytosis and elevated PCT.  Name of physician (or Provider) Contacted: [facility]  Current antibiotics: Vanc, Rocephin , Amp, Acyclovir  Changes to prescribed antibiotics recommended:  Recommendations declined by provider due to pending MRI  Results for orders placed or performed during the hospital encounter of 02/25/24  Blood Culture ID Panel (Reflexed) (Collected: 02/25/2024  3:20 PM)  Result Value Ref Range   Enterococcus faecalis NOT DETECTED NOT DETECTED   Enterococcus Faecium NOT DETECTED NOT DETECTED   Listeria monocytogenes NOT DETECTED NOT DETECTED   Staphylococcus species DETECTED (A) NOT DETECTED   Staphylococcus aureus (BCID) NOT DETECTED NOT DETECTED   Staphylococcus epidermidis DETECTED (A) NOT DETECTED   Staphylococcus lugdunensis NOT DETECTED NOT DETECTED   Streptococcus species NOT DETECTED NOT DETECTED   Streptococcus agalactiae NOT DETECTED NOT DETECTED   Streptococcus pneumoniae NOT DETECTED NOT DETECTED   Streptococcus pyogenes NOT DETECTED NOT DETECTED   A.calcoaceticus-baumannii NOT DETECTED NOT DETECTED   Bacteroides fragilis NOT DETECTED NOT DETECTED   Enterobacterales DETECTED (A) NOT DETECTED   Enterobacter cloacae complex NOT DETECTED NOT DETECTED   Escherichia coli NOT DETECTED NOT DETECTED   Klebsiella aerogenes NOT DETECTED NOT DETECTED   Klebsiella oxytoca NOT DETECTED NOT DETECTED   Klebsiella pneumoniae NOT DETECTED NOT DETECTED   Proteus species DETECTED (A) NOT DETECTED   Salmonella species NOT DETECTED NOT DETECTED   Serratia marcescens NOT DETECTED NOT DETECTED   Haemophilus influenzae NOT DETECTED NOT DETECTED   Neisseria  meningitidis NOT DETECTED NOT DETECTED   Pseudomonas aeruginosa NOT DETECTED NOT DETECTED   Stenotrophomonas maltophilia NOT DETECTED NOT DETECTED   Candida albicans NOT DETECTED NOT DETECTED   Candida auris NOT DETECTED NOT DETECTED   Candida glabrata NOT DETECTED NOT DETECTED   Candida krusei NOT DETECTED NOT DETECTED   Candida parapsilosis NOT DETECTED NOT DETECTED   Candida tropicalis NOT DETECTED NOT DETECTED   Cryptococcus neoformans/gattii NOT DETECTED NOT DETECTED   CTX-M ESBL NOT DETECTED NOT DETECTED   Carbapenem resistance IMP NOT DETECTED NOT DETECTED   Carbapenem resistance KPC NOT DETECTED NOT DETECTED   Methicillin resistance mecA/C DETECTED (A) NOT DETECTED   Carbapenem resistance NDM NOT DETECTED NOT DETECTED   Carbapenem resist OXA 48 LIKE NOT DETECTED NOT DETECTED   Carbapenem resistance VIM NOT DETECTED NOT DETECTED    Norlan Rann D. Marikay Show, PharmD, BCPS, BCCCP 02/26/2024, 1:32 PM

## 2024-02-26 NOTE — Progress Notes (Addendum)
 NAME:  Alex Frazier, MRN:  [id number], DOB:  10/08/1945, LOS: 1 ADMISSION DATE:  02/25/2024, CONSULTATION DATE:  6/19 REFERRING MD:  Dr. Monnie Anthony, CHIEF COMPLAINT: Acute encephalopathy  History of Present Illness:  Patient is a 78 year old male with pertinent PMH Parkinson's disease, dementia, peripheral neuropathy, orthostatic hypotension, HLD presents to [facility] on 6/19 with AMS.  On 6/19 patient having complaints of fatigue and weakness.  Per patient's wife patient is altered but more altered than normal.  LKN 6/18 10 p.m.  EMS called finding patient had tremors and hypertensive SBP>200.  Given clonidine .  Patient also noted to be febrile 104 F.  Transferred to [facility] ED.  On arrival neuroexam showing left upward gaze deviation patient altered not following commands but moving extremities spontaneously.  Patient also with diffuse shaking.  CT head with no acute abnormality.  Concerned for possible seizure given ativan. Loaded w/ keppra. Neuro consulted. Glucose 133.  VBG 7.44, 30, 31, 20.  BP stable and tachy 110s.  Tmax 105.3 F and WBC 5.8.  Cultures obtained and placed on broad-spectrum antibiotics.  Given IV fluids.  LA 7.9 then 6.7.  UA with large leukocytes.  COVID/flu/RSV negative.  CXR unremarkable. Given AMS, elevated LA, and need for possible EEG, PCCM consulted for icu admission.  Pertinent  Medical History   Past Medical History:  Diagnosis Date   B12 deficiency    Borderline   Bulging discs    Degenerative disc disease, cervical    Dementia associated with Parkinson disease (HCC)    Gastritis    Hyperlipidemia    Neuropathy    Parkinson's disease (HCC)    Parkinsonian syndrome (HCC)    Peripheral neuropathy    TIA (transient ischemic attack)    TIA symptoms from hypotension     Significant Hospital Events: Including procedures, antibiotic start and stop dates in addition to other pertinent events   6/19 admitted w/ ams and sepsis from uti.  6/20 still obtunded. Has proteus  and staph species in blood. MRI ordered. Cont CSF antimicrobial coverage. Feeding tube placed. On LTM. Renal fxn a little worse getting urine studies and renal US    Interim History / Subjective:  Lethargic getting LTM placed.   Objective    Blood pressure 134/88, pulse 77, temperature (!) 95.7 F (35.4 C), temperature source Axillary, resp. rate (!) 22, weight 60.7 kg, SpO2 99%.        Intake/Output Summary (Last 24 hours) at 02/26/2024 1051 Last data filed at 02/26/2024 1043 Gross per 24 hour  Intake 6031.85 ml  Output --  Net 6031.85 ml   Filed Weights   02/26/24 0000 02/26/24 0500  Weight: 60.7 kg 60.7 kg    Examination: Generally lethargic 78 year old male laying in bed. No distress but minimally responsive.  HENT NCAT PERRL no JVD Pulm dec bases. No accessory use on 4 lpm pcxr low lung vols.  Card rrr Abd soft ext warm  Neuro lethargic, perrl w/d to pain Gu cl yellow     Resolved problem list   Assessment and Plan  Acute metabolic encephalopathy +/- seizure  in setting of sepsis complicated by Dementia, Parkinson's disease & Mood disorder CT head neg.  W/ Proteus in blood seems more likely UT source but meningitis still on ddx  Plan resume sinemet  and prozac  via tube  neuro consulted; appreciate recs  Cont current abx as outlined below.  Getting MRI first; discussed w/ neuro. May hold off on LP seizure precautions Holding further AEDs  for now   Septic shock with lactiac acidosis  due to  GNR bacteremia 2/2 urinary tract source (suspect that the SA in blood is contamination)  -05/2023 hx of proteus uti and now BCID showing proteus as well.  Lactate clearing  Plan: Cont day 1 rocephin , vanc, ampicillin, acyclovir, will reach out to neuro. Feel like proteus likely the contributing factor and if so could narrow to ceftriaxone  only  Cont steroids (stress dose)  Keep euvolemic resume florinef  and midodrine  via tube Follow cultures    Acute respiratory failure w/  hypoxia -currently on 4L [place] sats mid 90s CXR clear -MS barrier here and certainly at risk for aspiration  Plan Asp precautions Supplemental oxygen for sats > 92 Pulse ox   AKI likely in setting of sepsis and hypovolemia. Scr a little worse  Plan Obtain urine Na and creatinine  Renal US  at bedside  Cont IVFs.  MAP goal > 65 Renal dose meds  Strict I&O  Elevated alk phosph Improved Plan Am CMP  Hyperglycemia Plan Ssi  Goal 140-180  HLD Plan: Resume when able to take oral   Anemia Thrombocytopenia Plan: Cont to trend  Trigger for transfusion is hgb < 7   Sacral and b/l heal wound POA Plan: WOC evaluated 6/20  Dressing procedure/placement/frequency:   Cleanse heels with soap and water, dry and apply silicone foam heel protectors.  Attempt to float heels at all times to reduce pressure.  Cleanse sacrum/coccyx/buttocks with soap and water, dry and apply Xeroform gauze Timm Foot [phone]) to areas of purple maroon discoloration.   3. Desitin for surrounding intact skin.   Best Practice (right click and Reselect all SmartList Selections daily)   Diet/type: tubefeeds DVT prophylaxis SCD Pressure ulcer(s): present on admission  GI prophylaxis: N/A Lines: N/A Foley:  N/A Code Status:  full code Last date of multidisciplinary goals of care discussion [6/20 wife and daugher at bedside. State patient okay with full code but would not want to be on vent long term] Critical care time: 45 min

## 2024-02-26 NOTE — Consult Note (Signed)
 Chief Complaint: Patient was seen in consultation today for  Chief Complaint  Patient presents with   Weakness   Altered Mental Status   at the request of Sheryle Donning NP  Referring Physician(s): Sheryle Donning NP  Supervising Physician: Marland Silvas  Patient Status: [facility]  History of Present Illness: Alex Frazier is a 78 y.o. male admitted last night with severe sepsis, with AMS, needing pressors. CT shows obstructing 6mm mid R ureteral calculus with hydronephrosis, concern of pyonephrosis. Urol concerned pt unsafe for OR. Decompressive perc nephrostomy catheter requested.  Past Medical History:  Diagnosis Date   B12 deficiency    Borderline   Bulging discs    Degenerative disc disease, cervical    Dementia associated with Parkinson disease (HCC)    Gastritis    Hyperlipidemia    Neuropathy    Parkinson's disease (HCC)    Parkinsonian syndrome (HCC)    Peripheral neuropathy    TIA (transient ischemic attack)    TIA symptoms from hypotension    Past Surgical History:  Procedure Laterality Date   APPENDECTOMY     BACK SURGERY      Allergies: Tape, Hydrocodone -acetaminophen , Indomethacin, Lyrica  [pregabalin ], Nsaids, and Quetiapine   Medications: Prior to Admission medications   Medication Sig Start Date End Date Taking? Authorizing Provider  acetaminophen  (TYLENOL ) 500 MG tablet Take 2 tablets (1,000 mg total) by mouth 3 (three) times daily. 03/22/20  Yes Vada Garibaldi, MD  Calcium  Carb-Cholecalciferol  (CALCIUM  600 + D PO) Take 1 tablet by mouth daily.   Yes [provider]  carbidopa -levodopa  (SINEMET  IR) 25-100 MG tablet Take 2 tablets by mouth 3 (three) times daily. Patient taking differently: Take 1.5 tablets by mouth 3 (three) times daily. 12/08/23  Yes Austine Lefort, MD  cloNIDine  (CATAPRES ) 0.1 MG tablet Take 1 tablet (0.1 mg total) by mouth every 8 (eight) hours as needed. Use as needed for systolic BP over 829. 10/05/23  Yes  Pickard, Cisco Crest, MD  Cyanocobalamin  (VITAMIN B 12 PO) Take 1 tablet by mouth in the morning.    Yes [provider]  ferrous sulfate 324 MG TBEC Take 324 mg by mouth at bedtime.   Yes Austine Lefort, MD  fludrocortisone  (FLORINEF ) 0.1 MG tablet Take 2 tablets (200 mcg total) by mouth daily. 02/18/24  Yes Austine Lefort, MD  FLUoxetine  (PROZAC ) 20 MG capsule Take 1 capsule (20 mg total) by mouth daily. Patient taking differently: Take 20 mg by mouth at bedtime. 12/04/23  Yes Austine Lefort, MD  midodrine  (PROAMATINE ) 10 MG tablet Take 1 tablet (10 mg total) by mouth 3 (three) times daily. Patient taking differently: Take 5-10 mg by mouth See admin instructions. Take 5mg  (1/2 tablet) by mouth every morning, 10mg  (1 tablet) at 1300, and 5mg  (1/2 tablet) at 1900 10/30/23  Yes Pickard, Cisco Crest, MD  polyethylene glycol powder (GLYCOLAX /MIRALAX ) 17 GM/SCOOP powder Take 17 g by mouth See admin instructions. Mix 17 grams of powder into 4-8 ounces of prune juice and drink once a day   Yes [provider]  pravastatin  (PRAVACHOL ) 20 MG tablet TAKE 1 TABLET BY MOUTH DAILY AT 6PM GENERIC EQUIVALENT FOR PRAVACHOL  10/19/23  Yes Austine Lefort, MD     Family History  Problem Relation Age of Onset   Parkinson's disease Mother    Parkinson's disease Father    Diabetes Sister    Heart disease Brother     Social History  Socioeconomic History   Marital status: Married    Spouse name: Not on file   Number of children: 3   Years of education: Not on file   Highest education level: High school graduate  Occupational History   Not on file  Tobacco Use   Smoking status: Never   Smokeless tobacco: Never  Vaping Use   Vaping status: Never Used  Substance and Sexual Activity   Alcohol use: No   Drug use: No   Sexual activity: Not Currently  Other Topics Concern   Not on file  Social History Narrative   Lives at home with his wife   Right handed   Caffeine: none    Social Drivers of Corporate investment banker Strain: Low Risk  (08/27/2023)   Overall Financial Resource Strain (CARDIA)    Difficulty of Paying Living Expenses: Not hard at all  Food Insecurity: No Food Insecurity (02/26/2024)   Hunger Vital Sign    Worried About Running Out of Food in the Last Year: Never true    Ran Out of Food in the Last Year: Never true  Transportation Needs: No Transportation Needs (02/26/2024)   PRAPARE - Administrator, Civil Service (Medical): No    Lack of Transportation (Non-Medical): No  Physical Activity: Inactive (08/27/2023)   Exercise Vital Sign    Days of Exercise per Week: 0 days    Minutes of Exercise per Session: 0 min  Stress: No Stress Concern Present (08/27/2023)   [employer] of Occupational Health - Occupational Stress Questionnaire    Feeling of Stress : Not at all  Social Connections: Moderately Isolated (02/26/2024)   Social Connection and Isolation Panel    Frequency of Communication with Friends and Family: Twice a week    Frequency of Social Gatherings with Friends and Family: Twice a week    Attends Religious Services: Never    Database administrator or Organizations: No    Attends Engineer, structural: Never    Marital Status: Married    ECOG Status: 4 - Bedbound  Review of Systems: A 12 point ROS discussed and pertinent positives are indicated in the HPI above.  All other systems are negative.  Review of Systems  Vital Signs: BP (!) 87/56   Pulse 71   Temp (!) 95.7 F (35.4 C) (Axillary) Comment: Reported to the nurse, warm blankets applied.  Resp (!) 29   Wt 60.7 kg   SpO2 98%   BMI 21.60 kg/m    Physical Exam Nonverbal  Well-developed and well-nourished.  Last Weight  Most recent update: 02/26/2024  5:35 AM    Weight  60.7 kg (133 lb 13.1 oz)            HENT:  Head: Normocephalic and atraumatic.  Eyes: CLosed Pulmonary/Chest: Effort normal. No stridor. No respiratory  distress.  Abdomen: soft, non distended Neurological:  does not respond to verbal stimuli Skin: Skin is warm and dry.  not diaphoretic.     Imaging: US  RENAL Result Date: 02/26/2024 CLINICAL DATA:  Acute renal insufficiency. EXAM: RENAL / URINARY TRACT ULTRASOUND COMPLETE COMPARISON:  CT of earlier today FINDINGS: Right Kidney: Renal measurements: 12.0 x 5.9 x 4.9 cm = volume: 181 mL. Hydronephrosis on CT has resolved. Normal echogenicity. Left Kidney: Renal measurements: 10.8 x 6.0 x 4.4 cm = volume: 148 mL. No hydronephrosis. Normal echogenicity. Bladder: Collapsed around a Foley catheter. Other: None. IMPRESSION: No acute process or explanation for renal insufficiency.  Right-sided hydronephrosis has resolved since prior CT. Electronically Signed   By: Lore Rode M.D.   On: 02/26/2024 18:17   CT ABDOMEN PELVIS WO CONTRAST Result Date: 02/26/2024 CLINICAL DATA:  Sepsis EXAM: CT ABDOMEN AND PELVIS WITHOUT CONTRAST TECHNIQUE: Multidetector CT imaging of the abdomen and pelvis was performed following the standard protocol without IV contrast. RADIATION DOSE REDUCTION: This exam was performed according to the departmental dose-optimization program which includes automated exposure control, adjustment of the mA and/or kV according to patient size and/or use of iterative reconstruction technique. COMPARISON:  CT abdomen and pelvis 03/17/2020. FINDINGS: Lower chest: There is bilateral lower lobe airspace consolidation, right greater than left. There are trace bilateral pleural effusions. Hepatobiliary: No focal liver abnormality is seen. No gallstones, gallbladder wall thickening, or biliary dilatation. Pancreas: Unremarkable. No pancreatic ductal dilatation or surrounding inflammatory changes. Spleen: Normal in size without focal abnormality. Adrenals/Urinary Tract: The bladder is completely decompressed by Foley catheter. There is a 6 mm calculus in the proximal right ureter. There is mild right-sided  hydronephrosis. Additionally, there is a hyperdensities seen throughout the ureter and renal collecting system proximal to this level. Additional punctate nonobstructing bilateral renal calculi are seen. There is mild right perinephric and proximal periureteral stranding and fluid. There is no left-sided hydronephrosis. The adrenal glands are within normal limits. Stomach/Bowel: There is a large amount of stool throughout the entire colon. Rectosigmoid colon is dilated and stool-filled. There is diffuse distal sigmoid colon and rectal wall thickening and surrounding inflammatory stranding. There is no bowel obstruction, pneumatosis or free air. The appendix is not visualized. Nasogastric tube tip is in the body of stomach. Vascular/Lymphatic: Aortic atherosclerosis. No enlarged abdominal or pelvic lymph nodes. Reproductive: Prostate gland smaller absent. There is likely a small right hydrocele. Other: There is mild body wall edema.  There is no ascites. Musculoskeletal: L1, L2 and L5 compression deformities are stable. L3 compression deformity has mildly increased compared to prior. T11 compression deformity has mildly increased from prior. The There is a new left gluteal walled low-attenuation intramuscular collection measures 8.3 x 5.0 x 7.0 cm. IMPRESSION: 1. There is a 6 mm calculus in the proximal right ureter with mild right-sided hydronephrosis. There is also hyperdensity throughout the right ureter and renal collecting system proximal to this level which may represent blood products. Correlate clinically for infection. 2. Additional nonobstructing bilateral renal calculi. 3. Dilated stool-filled rectosigmoid colon with wall thickening inflammation compatible with stercoral colitis. 4. New left gluteal walled low-attenuation intramuscular collection measuring 8.3 x 5.0 x 7.0 cm. Findings may represent hematoma or abscess. Of the etiologies not excluded. 5. Bilateral lower lobe airspace consolidation, right  greater than left, worrisome for pneumonia. Trace bilateral pleural effusions. 6. Mild body wall edema. 7. Aortic atherosclerosis. Aortic Atherosclerosis (ICD10-I70.0). Electronically Signed   By: Tyron Gallon M.D.   On: 02/26/2024 17:00   DG Abd Portable 1V Result Date: 02/26/2024 CLINICAL DATA:  Enteric tube placement EXAM: PORTABLE ABDOMEN - 1 VIEW COMPARISON:  abdominal radiograph dated 02/26/2024 at 4:13 a.m. has been replaced and FINDINGS: Gastric/enteric tube tip projects over the gastric fundus. Nonobstructive bowel gas pattern. Partially imaged large volume stool within the lower abdomen. IMPRESSION: Gastric/enteric tube tip projects over the gastric fundus. Electronically Signed   By: Limin  Xu M.D.   On: 02/26/2024 15:44   MR BRAIN W WO CONTRAST Result Date: 02/26/2024 EXAM: MRI BRAIN WITH AND WITHOUT CONTRAST 02/26/2024 01:15:45 PM TECHNIQUE: Multiplanar multisequence MRI of the head/brain was performed with  and without the administration of intravenous contrast. COMPARISON: Head CT 02/25/2024, MRI brain 10/28/2019. CLINICAL HISTORY: Meningitis/CNS infection suspected. FINDINGS: BRAIN AND VENTRICLES: Punctate focus of restricted diffusion in the subcortical white matter underlying the left precentral gyrus seen on axial image 42 series 2, consistent with acute infarct. Background of moderate chronic small vessel disease. Chronic microhemorrhage along the anterior right insula. 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 Generalized volume loss within the expected range for patient age. No acute intracranial hemorrhage. No mass effect or midline shift. No hydrocephalus. The sella is unremarkable. Normal flow voids. No mass or abnormal enhancement. ORBITS: No acute abnormality. SINUSES: No acute abnormality. BONES AND SOFT TISSUES: Normal bone marrow signal and enhancement. No acute soft tissue abnormality. IMPRESSION: 1. No acute findings of meningitis or CNS infection. 2. Punctate  acute infarct in the subcortical white matter underlying the left precentral gyrus. 3. Moderate chronic small vessel disease. Electronically signed by: Audra Blend MD 02/26/2024 02:16 PM EDT RP Workstation: [id number]   DG Abd Portable 1V Result Date: 02/26/2024 CLINICAL DATA:  78 year old male NG tube placement. EXAM: PORTABLE ABDOMEN - 1 VIEW COMPARISON:  CT Abdomen and Pelvis 03/17/2020. FINDINGS: Portable AP semi upright view at 0413 hours. Enteric tube courses through the central mediastinum, terminating in the left upper quadrant at the level of the stomach. The side hole is at or near the GEJ. Stomach appears decompressed. Nonobstructed visible bowel gas pattern. Lung bases appear stable since 2021. Normal visible mediastinal contours. No acute osseous abnormality identified. IMPRESSION: 1. Enteric tube terminates in the stomach, but side hole is at or near the GEJ. Recommend advancing 5 cm to ensure side hole placement within the stomach. 2.  Non obstructed bowel gas pattern. Electronically Signed   By: Marlise Simpers M.D.   On: 02/26/2024 04:24   CT Head Wo Contrast Result Date: 02/25/2024 CLINICAL DATA:  Seizure, new-onset, no history of trauma EXAM: CT HEAD WITHOUT CONTRAST TECHNIQUE: Contiguous axial images were obtained from the base of the skull through the vertex without intravenous contrast. RADIATION DOSE REDUCTION: This exam was performed according to the departmental dose-optimization program which includes automated exposure control, adjustment of the mA and/or kV according to patient size and/or use of iterative reconstruction technique. COMPARISON:  CT of the head dated May 14, 2023. FINDINGS: Brain: Generalized cerebral volume loss. Mild-to-moderate periventricular white matter disease. No evidence of hemorrhage, mass, cortical infarct or hydrocephalus. Vascular: Calcific atheromatous disease within the carotid siphons. Skull: Intact and unremarkable. Sinuses/Orbits: Clear paranasal  sinuses. Status post bilateral lens replacement. Other: None. IMPRESSION: 1. Age related atrophy and mild to moderate periventricular white matter disease, compatible with chronic small vessel disease. Electronically Signed   By: Maribeth Shivers M.D.   On: 02/25/2024 16:29   DG Chest Portable 1 View Result Date: 02/25/2024 CLINICAL DATA:  Hypoxia. EXAM: PORTABLE CHEST 1 VIEW COMPARISON:  05/14/2023 FINDINGS: Normal sized heart. Poor inspiration. Minimal linear atelectasis/scarring in the left lower lung zone. Normal vasculature. No pleural fluid. Diffuse osteopenia. IMPRESSION: 1. Poor inspiration. 2. Minimal linear atelectasis/scarring in the left lower lung zone. Electronically Signed   By: Catherin Closs M.D.   On: 02/25/2024 16:09    Labs:  CBC: Recent Labs    05/16/23 0948 12/08/23 1421 02/25/24 1810 02/26/24 0036 02/26/24 0522  WBC 2.8* 6.8 5.8  --  20.5*  HGB 13.5 11.8* 11.4* 11.9* 12.0*  HCT 40.8 36.3* 35.5* 35.0* 36.6*  PLT 156 265 144*  --  140*  COAGS: Recent Labs    05/14/23 1901 02/26/24 0522  INR 1.0 1.3*  APTT 28  --     BMP: Recent Labs    05/15/23 0527 05/16/23 0948 12/08/23 1421 02/25/24 1529 02/26/24 0036 02/26/24 0522  NA 133* 136 139 138 136 137  K 3.6 3.6 4.1 3.7 3.7 3.8  CL 100 100 104 105  --  108  CO2 23 27 28  19*  --  20*  GLUCOSE 89 149* 103* 132*  --  123*  BUN 17 16 21  27*  --  34*  CALCIUM  8.3* 8.3* 8.7 8.2*  --  7.8*  CREATININE 0.73 0.79 0.73 1.50*  --  2.36*  GFRNONAA >60 >60  --  48*  --  28*    LIVER FUNCTION TESTS: Recent Labs    05/14/23 1901 05/16/23 0948 12/08/23 1421 02/25/24 1529 02/26/24 0522  BILITOT 0.6 0.5 0.6 1.6* 0.9  AST 27 55* 15 37 93*  ALT 13 21 11 15 24   ALKPHOS 110 85  --  169* 110  PROT 7.6 6.1* 6.0* 6.2* 5.3*  ALBUMIN 4.1 3.3*  --  3.0* 2.3*    TUMOR MARKERS: No results for input(s): AFPTM, CEA, CA199, CHROMGRNA in the last 8760 hours.  Assessment and Plan:  My impression is that  this septic pt with obstructive mid R ureteral calculus and possible pyonephrosis may benefit from percutaneous nephrostomy decompression of the hydronephrotic right renal collecting system. Once stable, urology will address the obstructing calc.  Discussed clinical situation and recommendation with spouse and daughters. They seemed to understand and asked appropriate questions which were answered. THey consent to proceed. We will bring him down urgently for right percutaneous nephrostomy catheter placement, under moderate if any sedation.  Thank you for this interesting consult.  I greatly enjoyed meeting Alex Frazier and look forward to participating in their care.  A copy of this report was sent to the requesting provider on this date.  Electronically Signed: Dayne Nyelah Emmerich, MD 02/26/2024, 6:48 PM   I spent a total of 40 Minutes    in face to face in clinical consultation, greater than 50% of which was counseling/coordinating care for right hydronephrosis and sepsis.

## 2024-02-26 NOTE — Progress Notes (Signed)
 Bedside POCUS although w/ poor window I do not see any resp variation of the IVC Pressor needs still coming down CT abd results back The feeding tube is in body of stomach  Confirming our concern there was 6 mm calculus in prox right ureter w/ mild right sided hydo and and hypodensity on the renal collecting system Incidental findings Dilated stool filled bowel w/ concern for colitis Also  left gluteal walled low-attenuation intramuscular collection measuring 8.3 x 5.0 x 7.0 cm. Findings may represent hematoma or abscess. Of the etiologies not excluded.  Plan  Will call urology re right hydro Also ask IR to see.. may be best to have perc neph tube as well as possible drainage of the gluteal wall fluid collection

## 2024-02-26 NOTE — Procedures (Signed)
 Cortrak  Person Inserting Tube:  Jamelle Goldston T, RD Tube Type:  Cortrak - 43 inches Tube Size:  10 Tube Location:  Right nare Secured by: Bridle Technique Used to Measure Tube Placement:  Marking at nare/corner of mouth Cortrak Secured At:  65 cm   Cortrak Tube Team Note:  Consult received to place a Cortrak feeding tube.   X-ray is required. Please confirm placement prior to using tube.   If the tube becomes dislodged please keep the tube and contact the Cortrak team at [email or web address] for replacement.  If after hours and replacement cannot be delayed, place a NG tube and confirm placement with an abdominal x-ray.    Scheryl Cushing RD, LDN Contact via [employer].

## 2024-02-26 NOTE — Progress Notes (Signed)
 Afternoon review FENa indeterminate at 1% Mg 1.6 Lactic acid 3.0-->3.2 so up a little from 6.7 Awaiting the US  and formal CT results  Plan Cont to await above  Cont LR at current Cont NE for MAP > 65 Will look at IVC via POCUS Will repeat Lactic  Cont supportive care.

## 2024-02-26 NOTE — Progress Notes (Signed)
 Pharmacy Antibiotic Note  Alex Frazier is a 78 y.o. male admitted on 02/25/2024 with persistent encephalopathic lactic acidosis/with concerns for meningitis. Pharmacy has been consulted for meningitis antibiotic dosing.  -sCr 1.5 (bl~0.7-0.9), Tmax 105.3, lactic acid down trending but elevated -Blood/Urine cultures collected -Vanc 1g/ zosyn given in ED  Plan: -Acyclovir 10mg /kg (TBW) IV every 12 hours -Ampicillin 2g IV every 6 hours -Ceftriaxone  2g IV every 12 hours  -Additional Vancomycin 500mg  IV x1 for total loading dose of 1500mg  IV x1 -Vancomycin variable dosing given AKI -Monitor renal function -Follow up Lumbar puncture and CSF cultures -Follow up signs of clinical improvement, LOT, de-escalation of antibiotics    Weight: 60.7 kg (133 lb 13.1 oz)  Temp (24hrs), Avg:102.2 F (39 C), Min:98.4 F (36.9 C), Max:105.3 F (40.7 C)  Recent Labs  Lab 02/25/24 1529 02/25/24 1549 02/25/24 1804 02/25/24 1810  WBC  --   --   --  5.8  CREATININE 1.50*  --   --   --   LATICACIDVEN  --  7.9* 6.7*  --     Estimated Creatinine Clearance: 35.4 mL/min (A) (by C-G formula based on SCr of 1.5 mg/dL (H)).    Allergies  Allergen Reactions   Tape Other (See Comments)    SKIN IS VERY THIN- TEARS VERY EASILY!!   Hydrocodone -Acetaminophen  Other (See Comments)    Hallucinations and confusion   Indomethacin Other (See Comments)    Unknown reaction   Lyrica  [Pregabalin ] Other (See Comments)    Vivid hallucinations   Nsaids Other (See Comments)    Per family patient gets hallucination/confusion.   Quetiapine  Other (See Comments)    HYPOtension!!    Antimicrobials this admission: Acyclovir 6/20 >>  Ampicillin 6/20 >>  Ceftriaxone  6/20 >> Vancomycin 6/19 >>  Microbiology results: 6/19 BCx:  6/19 UCx:   6/19 MRSA PCR:   Thank you for allowing pharmacy to be a part of this patient's care.  Young Hensen, PharmD, BCPS Clinical Pharmacist 02/26/2024 12:55 AM

## 2024-02-26 NOTE — Progress Notes (Signed)
 Discussed CT w/ IR Dr Marlena Sima Plan to go ahead w/ Perc tube Re: fluid collection left gluteal wall. Well encapsulated. Family does report h/o fall. Based on this would be c/w healing hematoma. As we feel this is not likely the source of his infection we will hold off on draining.  Plan Perc neph tube right tonight I notified family and sent message to urology  As long as clinically improves for now we will plan on recommending re-imaging in 2-3 weeks of the gluteal wall fluid collection   Hadley Leu ACNP-BC [facility] Pulmonary/Critical Care Pager # [phone] OR # [phone] if no answer

## 2024-02-26 NOTE — Consult Note (Addendum)
 WOC Nurse Consult Note: Reason for Consult: B heels and sacral wounds  Wound type: 1.  B heels with Stage 1 Pressure Injury, erythema  2.  Deep Tissue Pressure injury sacrum/coccyx/buttocks purple maroon discoloration; appears to be evolving with one area of soft purple lower buttock  Pressure Injury POA: Yes Measurement: see nursing flowsheet  Wound bed: as above  Drainage (amount, consistency, odor) see nursing flowsheet  Periwound: erythema surrounding DTPI; hyperkeratotic skin to heels  Dressing procedure/placement/frequency:   Cleanse heels with soap and water, dry and apply silicone foam heel protectors.  Attempt to float heels at all times to reduce pressure.  Cleanse sacrum/coccyx/buttocks with soap and water, dry and apply Xeroform gauze Timm Foot [phone]) to areas of purple maroon discoloration.   Will write Desitin for surrounding intact skin.   Patient should be placed on a low air loss mattress due to widespread DTPI for pressure redistribution.   POC discussed with bedside nurse. DTPI high risk for deterioration.  Please reconsult WOC team if further needs arise.   Thank you,    Ronni Colace MSN, RN-BC, [employer]

## 2024-02-26 NOTE — Progress Notes (Signed)
 MRI reviewed  No findings c/w meningitis or cns infection   Discussed w/ neuro   Will hold off on LP

## 2024-02-26 NOTE — Progress Notes (Signed)
 Initial Nutrition Assessment  DOCUMENTATION CODES:   Severe malnutrition in context of chronic illness  INTERVENTION:   Initiate tube feeding via Cortrak tube: Osmolite 1.5 at 25 ml/h and increase by 10 ml every 8 hours to goal rate of 45 ml/hr (1080 ml per day)  Prosource TF20 60 ml daily  Provides 1700 kcal, 87 gm protein, 820 ml free water daily  100 mg thiamine daily x 7 days  MVI with minerals daily   Monitor magnesium and phosphorus daily x 4 occurrences, MD to replete as needed, as pt is at risk for refeeding syndrome given pt meets criteria for severe malnutriton.  Recommend transition from IVF to free water flushes 200 ml evey 6 hours Total free water 1620 ml   NUTRITION DIAGNOSIS:   Severe Malnutrition related to chronic illness as evidenced by severe fat depletion, severe muscle depletion.  GOAL:   Patient will meet greater than or equal to 90% of their needs  MONITOR:   TF tolerance, Diet advancement  REASON FOR ASSESSMENT:   Consult Enteral/tube feeding initiation and management  ASSESSMENT:   Pt with PMH of Parkinson's dz, dementia, autonomic dysfunction on midodrine , neuropathy admitted with fever and AMS.    Neurology following for possible meningitis.  Plan to exchange NG for cortrak today  Medications reviewed and include:  Carbidopa -levodopa  TID, solucortef, SSI every 4 hours LR @ 150 ml/hr Levophed @ 3 mcg  Labs reviewed:  CRP 17.9 Vitamin B12 1056  14 F NG tube;  per xray side port near GEJ and recommends tube advancement  NUTRITION - FOCUSED PHYSICAL EXAM:  Flowsheet Row Most Recent Value  Orbital Region Severe depletion  Upper Arm Region Severe depletion  Thoracic and Lumbar Region Severe depletion  Buccal Region Severe depletion  Temple Region Severe depletion  Clavicle Bone Region Severe depletion  Clavicle and Acromion Bone Region Severe depletion  Scapular Bone Region Unable to assess  Dorsal Hand Moderate depletion   Patellar Region Moderate depletion  Anterior Thigh Region Moderate depletion  Posterior Calf Region Unable to assess  [edema]  Edema (RD Assessment) Severe  [BLE]  Hair Reviewed  Eyes Unable to assess  Mouth Unable to assess  [edentulous]  Skin Reviewed  Nails Reviewed    Diet Order:   Diet Order             Diet NPO time specified  Diet effective now                   EDUCATION NEEDS:   Not appropriate for education at this time  Skin:  Skin Assessment: Skin Integrity Issues: Skin Integrity Issues:: Stage II, Stage I Stage I: bilateral heels Stage II: buttocks  Last BM:  6/20 x 2; medium type 3  Height:   Ht Readings from Last 1 Encounters:  12/08/23 5' 6 (1.676 m)    Weight:   Wt Readings from Last 1 Encounters:  02/26/24 60.7 kg    BMI:  Body mass index is 21.6 kg/m.  Estimated Nutritional Needs:   Kcal:  1700-1900  Protein:  80-100 grams  Fluid:  > 1.7 L/day  Randine Butcher., RD, LDN, CNSC See AMiON for contact information

## 2024-02-26 NOTE — Progress Notes (Addendum)
 eLink Physician-Brief Progress Note Patient Name: GAEGE SANGALANG DOB: August 27, 1946 MRN: [id number]   Date of Service  02/26/2024  HPI/Events of Note  78 year old male with a history of Parkinson's disease complicated by dementia, autonomic dysfunction, idiopathic polyneuropathy who presents with concern for sepsis brought in by EMS from home and is being admitted to the ICU with persistent encephalopathy lactic acidosis.  Patient is febrile, tachypneic, and previously hypotensive saturating 98%.  Patient is on low-dose norepinephrine and received broad-spectrum antibiotics.  eICU Interventions  Maintain broad-spectrum antibiotics, blood and urine cultures pending.  Potential LP  crystalloid resuscitation, maintain norepinephrine as needed for MAP greater than 65.  Trend lactic acid  EEG pending.  AEDs per neurology  DVT prophylaxis with SCDs, heparin GI prophylaxis not indicated   0150 -request for potential Foley.  0 cc on bladder scan.  Insertion of a Foley with active UTI/cystitis can exacerbate bladder spasms and discomfort and if the patient has 0 cc in the bladder, there is no indication for active drainage.  Refer to standard retention protocol.  In-N-Out cath as needed  0326 -nearly maxed on peripheral norepinephrine, +2.5 L for sepsis.  Will refer to ground team for potential CVC.  In the interim, increase the ceiling of norepinephrine  Intervention Category Evaluation Type: New Patient Evaluation  Andersen Mckiver 02/26/2024, 12:32 AM

## 2024-02-26 NOTE — Progress Notes (Signed)
 LTM EEG hooked up and recording with MRI compatible leads. [facility] monitoring.

## 2024-02-27 ENCOUNTER — Inpatient Hospital Stay (HOSPITAL_COMMUNITY)

## 2024-02-27 DIAGNOSIS — A4159 Other Gram-negative sepsis: Secondary | ICD-10-CM

## 2024-02-27 DIAGNOSIS — G9340 Encephalopathy, unspecified: Secondary | ICD-10-CM | POA: Diagnosis not present

## 2024-02-27 DIAGNOSIS — B964 Proteus (mirabilis) (morganii) as the cause of diseases classified elsewhere: Secondary | ICD-10-CM | POA: Diagnosis not present

## 2024-02-27 DIAGNOSIS — R569 Unspecified convulsions: Secondary | ICD-10-CM | POA: Diagnosis not present

## 2024-02-27 DIAGNOSIS — J690 Pneumonitis due to inhalation of food and vomit: Secondary | ICD-10-CM

## 2024-02-27 DIAGNOSIS — F0280 Dementia in other diseases classified elsewhere without behavioral disturbance: Secondary | ICD-10-CM

## 2024-02-27 DIAGNOSIS — G20A1 Parkinson's disease without dyskinesia, without mention of fluctuations: Secondary | ICD-10-CM | POA: Diagnosis not present

## 2024-02-27 DIAGNOSIS — R6521 Severe sepsis with septic shock: Secondary | ICD-10-CM | POA: Diagnosis not present

## 2024-02-27 DIAGNOSIS — N132 Hydronephrosis with renal and ureteral calculous obstruction: Secondary | ICD-10-CM

## 2024-02-27 LAB — URINE CULTURE: Culture: >=100000 — AB

## 2024-02-27 LAB — GLUCOSE, CAPILLARY
Glucose-Capillary: 103 mg/dL — ABNORMAL HIGH (ref 70–99)
Glucose-Capillary: 87 mg/dL (ref 70–99)
Glucose-Capillary: 89 mg/dL (ref 70–99)
Glucose-Capillary: 115 mg/dL — ABNORMAL HIGH (ref 70–99)
Glucose-Capillary: 135 mg/dL — ABNORMAL HIGH (ref 70–99)
Glucose-Capillary: 132 mg/dL — ABNORMAL HIGH (ref 70–99)
Glucose-Capillary: 156 mg/dL — ABNORMAL HIGH (ref 70–99)
Glucose-Capillary: 189 mg/dL — ABNORMAL HIGH (ref 70–99)
Glucose-Capillary: 170 mg/dL — ABNORMAL HIGH (ref 70–99)

## 2024-02-27 LAB — BASIC METABOLIC PANEL WITH GFR
Anion gap: 10 (ref 5–15)
BUN: 44 mg/dL — ABNORMAL HIGH (ref 8–23)
CO2: 19 mmol/L — ABNORMAL LOW (ref 22–32)
Calcium: 7.2 mg/dL — ABNORMAL LOW (ref 8.9–10.3)
Chloride: 107 mmol/L (ref 98–111)
Creatinine, Ser: 2.6 mg/dL — ABNORMAL HIGH (ref 0.61–1.24)
GFR, Estimated: 25 mL/min — ABNORMAL LOW (ref >60)
Glucose, Bld: 128 mg/dL — ABNORMAL HIGH (ref 70–99)
Potassium: 4.4 mmol/L (ref 3.5–5.1)
Sodium: 136 mmol/L (ref 135–145)

## 2024-02-27 LAB — MAGNESIUM: Magnesium: 2 mg/dL (ref 1.7–2.4)

## 2024-02-27 LAB — VANCOMYCIN, RANDOM: Vancomycin Rm: 12 ug/mL

## 2024-02-27 LAB — TSH: TSH: 1.448 u[IU]/mL (ref 0.350–4.500)

## 2024-02-27 LAB — VITAMIN B12: Vitamin B-12: 1372 pg/mL — ABNORMAL HIGH (ref 180–914)

## 2024-02-27 LAB — FOLATE: Folate: 8.1 ng/mL (ref >5.9)

## 2024-02-27 LAB — PHOSPHORUS: Phosphorus: 3.1 mg/dL (ref 2.5–4.6)

## 2024-02-27 MED ORDER — SODIUM CHLORIDE 0.9% FLUSH
5.0000 mL | Freq: Three times a day (TID) | INTRAVENOUS | Status: DC
Start: 2024-02-27 — End: 2024-03-05
  Administered 2024-02-27 – 2024-03-04 (×20): 5 mL

## 2024-02-27 MED ORDER — SODIUM CHLORIDE 3 % IN NEBU
4.0000 mL | INHALATION_SOLUTION | Freq: Two times a day (BID) | RESPIRATORY_TRACT | Status: AC
  Administered 2024-02-27 – 2024-02-29 (×5): 4 mL via RESPIRATORY_TRACT
  Filled 2024-02-27 (×5): qty 4

## 2024-02-27 MED ORDER — VANCOMYCIN HCL 500 MG/100ML IV SOLN
500.0000 mg | Freq: Once | INTRAVENOUS | Status: DC
  Filled 2024-02-27: qty 100

## 2024-02-27 MED ORDER — SODIUM CHLORIDE 0.9 % IV SOLN: 2.0000 g | INTRAVENOUS | Status: DC

## 2024-02-27 MED ORDER — SODIUM CHLORIDE 0.9 % IV SOLN
2.0000 g | INTRAVENOUS | Status: DC
  Administered 2024-02-28 – 2024-03-03 (×6): 2 g via INTRAVENOUS
  Filled 2024-02-27 (×6): qty 20

## 2024-02-27 NOTE — Progress Notes (Signed)
 NAME:  DEFOREST MAIDEN, MRN:  [id number], DOB:  [birth year], LOS: 2 ADMISSION DATE:  02/25/2024, CONSULTATION DATE:  6/19 REFERRING MD:  Dr. Randol, CHIEF COMPLAINT: Acute encephalopathy  History of Present Illness:  Patient is a 78 year old male with pertinent PMH Parkinson's disease, dementia, peripheral neuropathy, orthostatic hypotension, HLD presents to [facility] on 6/19 with AMS.  On 6/19 patient having complaints of fatigue and weakness.  Per patient's wife patient is altered but more altered than normal.  LKN 6/18 10 p.m.  EMS called finding patient had tremors and hypertensive SBP>200.  Given clonidine .  Patient also noted to be febrile 104 F.  Transferred to [facility] ED.  On arrival neuroexam showing left upward gaze deviation patient altered not following commands but moving extremities spontaneously.  Patient also with diffuse shaking.  CT head with no acute abnormality.  Concerned for possible seizure given ativan . Loaded w/ keppra . Neuro consulted. Glucose 133.  VBG 7.44, 30, 31, 20.  BP stable and tachy 110s.  Tmax 105.3 F and WBC 5.8.  Cultures obtained and placed on broad-spectrum antibiotics.  Given IV fluids.  LA 7.9 then 6.7.  UA with large leukocytes.  COVID/flu/RSV negative.  CXR unremarkable. Given AMS, elevated LA, and need for possible EEG, PCCM consulted for icu admission.  Pertinent  Medical History   Past Medical History:  Diagnosis Date   B12 deficiency    Borderline   Bulging discs    Degenerative disc disease, cervical    Dementia associated with Parkinson disease (HCC)    Gastritis    Hyperlipidemia    Neuropathy    Parkinson's disease (HCC)    Parkinsonian syndrome (HCC)    Peripheral neuropathy    TIA (transient ischemic attack)    TIA symptoms from hypotension   Significant Hospital Events: Including procedures, antibiotic start and stop dates in addition to other pertinent events   6/19 admitted w/ ams and sepsis from uti.  6/20 still obtunded. Has proteus and  staph species in blood. MRI ordered. Cont CSF antimicrobial coverage. Feeding tube placed. On LTM. Renal fxn a little worse getting urine studies and renal US . Had perc nephrostomy tube placed by IR.  Interim History / Subjective:   No acute events overnight Patient not alert this morning, but coughing, congested upper airway Wife at bedside 8L positive since admission  Objective    Blood pressure 106/60, pulse 75, temperature (!) 97.2 F (36.2 C), temperature source Axillary, resp. rate 20, weight 60.7 kg, SpO2 94%.        Intake/Output Summary (Last 24 hours) at 02/27/2024 0750 Last data filed at 02/27/2024 0700 Gross per 24 hour  Intake 3558.72 ml  Output 550 ml  Net 3008.72 ml   Filed Weights   02/26/24 0000 02/26/24 0500  Weight: 60.7 kg 60.7 kg    Examination: Generally elderly male, ill appearing, not alert  HENT NCAT PERRL no JVD Pulm course breath sounds, productive sounding cough  Card rrr Abd soft ext warm  Neuro lethargic, perrl w/d to pain Gu perc nephrostomy drain in place  Resolved problem list   Assessment and Plan   Septic shock due to proteus bacteremia from Right pyelonephritis due to obstructing stone Lactic Acidosis Plan - s/p percutaneous nephrostomy drain by IR on 6/20 - narrow antibiotics to ceftriaxone  alone - he is on stress dose steroids, hydrocort  100mcg daily - continued on home florinef  and midodrine   Acute metabolic encephalopathy +/- seizure  in setting of sepsis complicated by Dementia, Parkinson's disease &  Mood disorder CT head neg.  cEEG negative for seizures Plan -continue sinemet  and prozac  via tube  -neuro consulted; appreciate recs  -low concern for CNS infection, discontinued meningitis/encephalitis coverage seizure precautions  Acute respiratory failure w/ hypoxia Aspiration Pneumonia -currently on 4-5L [place] sats mid 90s CXR clear -MS barrier here and certainly at risk for aspiration  Plan -Asp  precautions -Supplemental oxygen for sats > 92 -Pulse ox  -On ceftriaxone  -Start hypertonic nebs for thick secretions  AKI Pyelonephritis due to obstructing stone s/p perc nephrostomy tube Plan -Monitor Cr -MAP goal > 65 -Renal dose meds  -Strict I&O  Elevated alk phosph Improved Plan Continue to trend  Hyperglycemia Plan Ssi  Goal 140-180  HLD Plan: Resume when able to take oral   Anemia Thrombocytopenia Plan: Cont to trend  Trigger for transfusion is hgb < 7   Sacral and b/l heal wound POA Plan: WOC evaluated 6/20  Dressing procedure/placement/frequency:   Cleanse heels with soap and water, dry and apply silicone foam heel protectors.  Attempt to float heels at all times to reduce pressure.  Cleanse sacrum/coccyx/buttocks with soap and water, dry and apply Xeroform gauze Soila [phone]) to areas of purple maroon discoloration.   3. Desitin for surrounding intact skin.   Best Practice (right click and Reselect all SmartList Selections daily)   Diet/type: tubefeeds DVT prophylaxis SCD Pressure ulcer(s): present on admission  GI prophylaxis: N/A Lines: Central line Foley:  N/A Code Status:  full code Last date of multidisciplinary goals of care discussion [6/20 wife and daugher at bedside. State patient okay with full code but would not want to be on vent long term] Critical care time: 45 min     Dorn Chill, MD [facility] Office: [phone]   See Amion for personal pager PCCM on call pager [phone] until 7pm. Please call Elink 7p-7a. [phone]

## 2024-02-27 NOTE — Procedures (Signed)
 Patient Name: UMBERTO PAVEK  MRN: [id number]  Epilepsy Attending: Arlin MALVA Krebs  Referring Physician/Provider: Jerrie Lola CROME, MD  Duration: 02/26/2024 9056 to 02/27/2024 1130  Patient history: 77yo noted to have an episode of being nearly unresponsive having diffuse tremors with left gaze deviation. EEG to evaluate for seizure.  Level of alertness: Awake/ lethargic   AEDs during EEG study: None  Technical aspects: This EEG study was done with scalp electrodes positioned according to the 10-20 International system of electrode placement. Electrical activity was reviewed with band pass filter of 1-70Hz , sensitivity of 7 uV/mm, display speed of 74mm/sec with a 60Hz  notched filter applied as appropriate. EEG data were recorded continuously and digitally stored.  Video monitoring was available and reviewed as appropriate.  Description: EEG showed continuous generalized 3 to 6 Hz theta-delta slowing, at times with triphasic morphology. Hyperventilation and photic stimulation were not performed.     EEG was disconnected on 02/26/2024 between 1214 to 1348 and 1911 to 2009 due to testing  ABNORMALITY - Continuous slow, generalized  IMPRESSION: This study is  suggestive of moderate to severe diffuse encephalopathy. No seizures or epileptiform discharges were seen throughout the recording.  Faryal Marxen O Ryen Heitmeyer

## 2024-02-27 NOTE — Consult Note (Signed)
 Consultation: Right proximal stone with hydronephrosis and sepsis Requested by: Dr. Dorn Chill  History of Present Illness: Alex Frazier is a 78 year old critically ill male.  He underwent a CT scan of the abdomen and pelvis which revealed a 6 mm right proximal ureteral stone and hydronephrosis.  Discussed with NP Jenna and this was likely the source of his sepsis.  We discussed alternatives for source control such as ureteral stent or right nephrostomy tube.  Given his critical illness and continued need for pressors and possible need for gluteal drain IR seemed appropriate.  He underwent a right nephrostomy tube 02/26/2024.  He has remained critically ill with neuro eval for encephalopathy.  His creatinine went up despite the nephrostomy tube.  Past Medical History:  Diagnosis Date   B12 deficiency    Borderline   Bulging discs    Degenerative disc disease, cervical    Dementia associated with Parkinson disease (HCC)    Gastritis    Hyperlipidemia    Neuropathy    Parkinson's disease (HCC)    Parkinsonian syndrome (HCC)    Peripheral neuropathy    TIA (transient ischemic attack)    TIA symptoms from hypotension   Past Surgical History:  Procedure Laterality Date   APPENDECTOMY     BACK SURGERY      Home Medications:  Medications Prior to Admission  Medication Sig Dispense Refill Last Dose/Taking   acetaminophen  (TYLENOL ) 500 MG tablet Take 2 tablets (1,000 mg total) by mouth 3 (three) times daily. 30 tablet 0 Unknown   Calcium  Carb-Cholecalciferol  (CALCIUM  600 + D PO) Take 1 tablet by mouth daily.   Past Week   carbidopa -levodopa  (SINEMET  IR) 25-100 MG tablet Take 2 tablets by mouth 3 (three) times daily. (Patient taking differently: Take 1.5 tablets by mouth 3 (three) times daily.) 540 tablet 3 02/25/2024   cloNIDine  (CATAPRES ) 0.1 MG tablet Take 1 tablet (0.1 mg total) by mouth every 8 (eight) hours as needed. Use as needed for systolic BP over 819. 30 tablet 3 02/25/2024    Cyanocobalamin  (VITAMIN B 12 PO) Take 1 tablet by mouth in the morning.    Past Week   ferrous sulfate 324 MG TBEC Take 324 mg by mouth at bedtime.   Past Week   fludrocortisone  (FLORINEF ) 0.1 MG tablet Take 2 tablets (200 mcg total) by mouth daily. 180 tablet 0 Past Week   FLUoxetine  (PROZAC ) 20 MG capsule Take 1 capsule (20 mg total) by mouth daily. (Patient taking differently: Take 20 mg by mouth at bedtime.) 90 capsule 3 Past Week   midodrine  (PROAMATINE ) 10 MG tablet Take 1 tablet (10 mg total) by mouth 3 (three) times daily. (Patient taking differently: Take 5-10 mg by mouth See admin instructions. Take 5mg  (1/2 tablet) by mouth every morning, 10mg  (1 tablet) at 1300, and 5mg  (1/2 tablet) at 1900) 270 tablet 1 02/25/2024   polyethylene glycol powder (GLYCOLAX /MIRALAX ) 17 GM/SCOOP powder Take 17 g by mouth See admin instructions. Mix 17 grams of powder into 4-8 ounces of prune juice and drink once a day   02/25/2024   pravastatin  (PRAVACHOL ) 20 MG tablet TAKE 1 TABLET BY MOUTH DAILY AT 6PM GENERIC EQUIVALENT FOR PRAVACHOL  90 tablet 3 Past Week   Allergies:  Allergies  Allergen Reactions   Tape Other (See Comments)    SKIN IS VERY THIN- TEARS VERY EASILY!!   Hydrocodone -Acetaminophen  Other (See Comments)    Hallucinations and confusion   Indomethacin Other (See Comments)    Unknown reaction   Lyrica  [  Pregabalin ] Other (See Comments)    Vivid hallucinations   Nsaids Other (See Comments)    Per family patient gets hallucination/confusion.   Quetiapine  Other (See Comments)    HYPOtension!!    Family History  Problem Relation Age of Onset   Parkinson's disease Mother    Parkinson's disease Father    Diabetes Sister    Heart disease Brother    Social History:  reports that he has never smoked. He has never used smokeless tobacco. He reports that he does not drink alcohol and does not use drugs.  ROS: A complete review of systems was performed.  All systems are negative except for  pertinent findings as noted. Review of Systems  Unable to perform ROS: Mental status change     Physical Exam:  Vital signs in last 24 hours: Temp:  [97 F (36.1 C)-97.4 F (36.3 C)] 97.4 F (36.3 C) (06/21 1605) Pulse Rate:  [68-101] 78 (06/21 1645) Resp:  [5-32] 25 (06/21 1645) BP: (73-231)/(44-151) 134/77 (06/21 1645) SpO2:  [75 %-100 %] 95 % (06/21 1645) General:  Not alert, but resting in no acute distress - HEENT: Normocephalic, atraumatic, feeding tube, EEG leads and video monitor  Cardiovascular: Regular rate and rhythm Lungs: Regular rate and effort Abdomen: Soft, nontender, nondistended, no abdominal masses Back: No CVA tenderness, Right Nx tube in place with clear urine in tubing  Extremities: No edema Neurologic: Grossly intact  Laboratory Data:  Results for orders placed or performed during the hospital encounter of 02/25/24 (from the past 24 hours)  Glucose, capillary     Status: None   Collection Time: 02/26/24  8:09 PM  Result Value Ref Range   Glucose-Capillary 89 70 - 99 mg/dL  Glucose, capillary     Status: None   Collection Time: 02/26/24 11:48 PM  Result Value Ref Range   Glucose-Capillary 92 70 - 99 mg/dL  Glucose, capillary     Status: Abnormal   Collection Time: 02/27/24  3:31 AM  Result Value Ref Range   Glucose-Capillary 115 (H) 70 - 99 mg/dL  Glucose, capillary     Status: Abnormal   Collection Time: 02/27/24  7:44 AM  Result Value Ref Range   Glucose-Capillary 135 (H) 70 - 99 mg/dL  Vancomycin , random     Status: None   Collection Time: 02/27/24  8:41 AM  Result Value Ref Range   Vancomycin  Rm 12 ug/mL  Basic metabolic panel with GFR     Status: Abnormal   Collection Time: 02/27/24  8:41 AM  Result Value Ref Range   Sodium 136 135 - 145 mmol/L   Potassium 4.4 3.5 - 5.1 mmol/L   Chloride 107 98 - 111 mmol/L   CO2 19 (L) 22 - 32 mmol/L   Glucose, Bld 128 (H) 70 - 99 mg/dL   BUN 44 (H) 8 - 23 mg/dL   Creatinine, Ser 7.39 (H) 0.61 - 1.24  mg/dL   Calcium  7.2 (L) 8.9 - 10.3 mg/dL   GFR, Estimated 25 (L) >60 mL/min   Anion gap 10 5 - 15  Magnesium      Status: None   Collection Time: 02/27/24  8:41 AM  Result Value Ref Range   Magnesium  2.0 1.7 - 2.4 mg/dL  Phosphorus     Status: None   Collection Time: 02/27/24  8:41 AM  Result Value Ref Range   Phosphorus 3.1 2.5 - 4.6 mg/dL  Glucose, capillary     Status: Abnormal   Collection Time: 02/27/24 12:01  PM  Result Value Ref Range   Glucose-Capillary 132 (H) 70 - 99 mg/dL  Glucose, capillary     Status: Abnormal   Collection Time: 02/27/24  4:06 PM  Result Value Ref Range   Glucose-Capillary 156 (H) 70 - 99 mg/dL   Recent Results (from the past 240 hours)  Blood culture (routine x 2)     Status: Abnormal (Preliminary result)   Collection Time: 02/25/24  3:20 PM   Specimen: BLOOD RIGHT ARM  Result Value Ref Range Status   Specimen Description   Final    BLOOD RIGHT ARM Performed at [facility], [address], [place], [place] [address]    Special Requests   Final    BOTTLES DRAWN AEROBIC AND ANAEROBIC Blood Culture results may not be optimal due to an inadequate volume of blood received in culture bottles Performed at [facility], [address] [place], [place] [address]    Culture  Setup Time   Final    GRAM NEGATIVE RODS GRAM POSITIVE COCCI IN CLUSTERS IN BOTH AEROBIC AND ANAEROBIC BOTTLES CRITICAL RESULT CALLED TO, READ BACK BY AND VERIFIED WITH: PHARMD E.MARTIN AT 1006 ON 02/26/2024 BY T.SAAD. Performed at [facility], [address] [place], [place] [address]    Culture (A)  Final    PROTEUS MIRABILIS STAPHYLOCOCCUS HOMINIS CULTURE REINCUBATED FOR BETTER GROWTH SUSCEPTIBILITIES TO FOLLOW FOR  PROTEUS MIRABILIS    Report Status PENDING  Incomplete  Blood Culture ID Panel (Reflexed)     Status: Abnormal   Collection Time: 02/25/24  3:20 PM  Result Value Ref Range Status   Enterococcus faecalis NOT DETECTED NOT  DETECTED Final   Enterococcus Faecium NOT DETECTED NOT DETECTED Final   Listeria monocytogenes NOT DETECTED NOT DETECTED Final   Staphylococcus species DETECTED (A) NOT DETECTED Final    Comment: CRITICAL RESULT CALLED TO, READ BACK BY AND VERIFIED WITH: PHARMD E.MARTIN AT 1006 ON 02/26/2024 BY T.SAAD.    Staphylococcus aureus (BCID) NOT DETECTED NOT DETECTED Final   Staphylococcus epidermidis DETECTED (A) NOT DETECTED Final    Comment: Methicillin (oxacillin) resistant coagulase negative staphylococcus. Possible blood culture contaminant (unless isolated from more than one blood culture draw or clinical case suggests pathogenicity). No antibiotic treatment is indicated for blood  culture contaminants. CRITICAL RESULT CALLED TO, READ BACK BY AND VERIFIED WITH: PHARMD E.MARTIN AT 1006 ON 02/26/2024 BY T.SAAD.    Staphylococcus lugdunensis NOT DETECTED NOT DETECTED Final   Streptococcus species NOT DETECTED NOT DETECTED Final   Streptococcus agalactiae NOT DETECTED NOT DETECTED Final   Streptococcus pneumoniae NOT DETECTED NOT DETECTED Final   Streptococcus pyogenes NOT DETECTED NOT DETECTED Final   A.calcoaceticus-baumannii NOT DETECTED NOT DETECTED Final   Bacteroides fragilis NOT DETECTED NOT DETECTED Final   Enterobacterales DETECTED (A) NOT DETECTED Final    Comment: Enterobacterales represent a large order of gram negative bacteria, not a single organism. CRITICAL RESULT CALLED TO, READ BACK BY AND VERIFIED WITH: PHARMD E.MARTIN AT 1006 ON 02/26/2024 BY T.SAAD.    Enterobacter cloacae complex NOT DETECTED NOT DETECTED Final   Escherichia coli NOT DETECTED NOT DETECTED Final   Klebsiella aerogenes NOT DETECTED NOT DETECTED Final   Klebsiella oxytoca NOT DETECTED NOT DETECTED Final   Klebsiella pneumoniae NOT DETECTED NOT DETECTED Final   Proteus species DETECTED (A) NOT DETECTED Final    Comment: CRITICAL RESULT CALLED TO, READ BACK BY AND VERIFIED WITH: PHARMD E.MARTIN AT 1006 ON  02/26/2024 BY T.SAAD.  Salmonella species NOT DETECTED NOT DETECTED Final   Serratia marcescens NOT DETECTED NOT DETECTED Final   Haemophilus influenzae NOT DETECTED NOT DETECTED Final   Neisseria meningitidis NOT DETECTED NOT DETECTED Final   Pseudomonas aeruginosa NOT DETECTED NOT DETECTED Final   Stenotrophomonas maltophilia NOT DETECTED NOT DETECTED Final   Candida albicans NOT DETECTED NOT DETECTED Final   Candida auris NOT DETECTED NOT DETECTED Final   Candida glabrata NOT DETECTED NOT DETECTED Final   Candida krusei NOT DETECTED NOT DETECTED Final   Candida parapsilosis NOT DETECTED NOT DETECTED Final   Candida tropicalis NOT DETECTED NOT DETECTED Final   Cryptococcus neoformans/gattii NOT DETECTED NOT DETECTED Final   CTX-M ESBL NOT DETECTED NOT DETECTED Final   Carbapenem resistance IMP NOT DETECTED NOT DETECTED Final   Carbapenem resistance KPC NOT DETECTED NOT DETECTED Final   Methicillin resistance mecA/C DETECTED (A) NOT DETECTED Final    Comment: CRITICAL RESULT CALLED TO, READ BACK BY AND VERIFIED WITH: PHARMD E.MARTIN AT 1006 ON 02/26/2024 BY T.SAAD.    Carbapenem resistance NDM NOT DETECTED NOT DETECTED Final   Carbapenem resist OXA 48 LIKE NOT DETECTED NOT DETECTED Final   Carbapenem resistance VIM NOT DETECTED NOT DETECTED Final    Comment: Performed at [facility], [address], [place], [place] [address]  Blood culture (routine x 2)     Status: Abnormal (Preliminary result)   Collection Time: 02/25/24  3:22 PM   Specimen: BLOOD  Result Value Ref Range Status   Specimen Description   Final    BLOOD RIGHT ANTECUBITAL Performed at [facility], [name], [place], [place] [address]    Special Requests   Final    BOTTLES DRAWN AEROBIC AND ANAEROBIC Blood Culture results may not be optimal due to an inadequate volume of blood received in culture bottles Performed at [facility], [address]  [place], [place] [address]    Culture  Setup Time   Final    GRAM NEGATIVE RODS GRAM POSITIVE COCCI IN BOTH AEROBIC AND ANAEROBIC BOTTLES CRITICAL VALUE NOTED.  VALUE IS CONSISTENT WITH PREVIOUSLY REPORTED AND CALLED VALUE.    Culture (A)  Final    PROTEUS MIRABILIS GRAM POSITIVE COCCI CULTURE REINCUBATED FOR BETTER GROWTH Performed at [facility], [address] [place], [place] [address]    Report Status PENDING  Incomplete  Resp panel by RT-PCR (RSV, Flu A&B, Covid) Anterior Nasal Swab     Status: None   Collection Time: 02/25/24  3:47 PM   Specimen: Anterior Nasal Swab  Result Value Ref Range Status   SARS Coronavirus 2 by RT PCR NEGATIVE NEGATIVE Final    Comment: (NOTE) SARS-CoV-2 target nucleic acids are NOT DETECTED.  The SARS-CoV-2 RNA is generally detectable in upper respiratory specimens during the acute phase of infection. The lowest concentration of SARS-CoV-2 viral copies this assay can detect is 138 copies/mL. A negative result does not preclude SARS-Cov-2 infection and should not be used as the sole basis for treatment or other patient management decisions. A negative result may occur with  improper specimen collection/handling, submission of specimen other than nasopharyngeal swab, presence of viral mutation(s) within the areas targeted by this assay, and inadequate number of viral copies(<138 copies/mL). A negative result must be combined with clinical observations, patient history, and epidemiological information. The expected result is Negative.  Fact Sheet for Patients:  BloggerCourse.com  Fact Sheet for Healthcare Providers:  SeriousBroker.it  This test is  no t yet approved or cleared by the [place]  FDA and  has been authorized for detection and/or diagnosis of SARS-CoV-2 by FDA under an Emergency Use Authorization (EUA). This EUA will remain  in effect (meaning this test can be used) for the  duration of the COVID-19 declaration under Section 564(b)(1) of the Act, 21 U.S.C.section 360bbb-3(b)(1), unless the authorization is terminated  or revoked sooner.       Influenza A by PCR NEGATIVE NEGATIVE Final   Influenza B by PCR NEGATIVE NEGATIVE Final    Comment: (NOTE) The Xpert Xpress SARS-CoV-2/FLU/RSV plus assay is intended as an aid in the diagnosis of influenza from Nasopharyngeal swab specimens and should not be used as a sole basis for treatment. Nasal washings and aspirates are unacceptable for Xpert Xpress SARS-CoV-2/FLU/RSV testing.  Fact Sheet for Patients: BloggerCourse.com  Fact Sheet for Healthcare Providers: SeriousBroker.it  This test is not yet approved or cleared by the [place]  FDA and has been authorized for detection and/or diagnosis of SARS-CoV-2 by FDA under an Emergency Use Authorization (EUA). This EUA will remain in effect (meaning this test can be used) for the duration of the COVID-19 declaration under Section 564(b)(1) of the Act, 21 U.S.C. section 360bbb-3(b)(1), unless the authorization is terminated or revoked.     Resp Syncytial Virus by PCR NEGATIVE NEGATIVE Final    Comment: (NOTE) Fact Sheet for Patients: BloggerCourse.com  Fact Sheet for Healthcare Providers: SeriousBroker.it  This test is not yet approved or cleared by the [place]  FDA and has been authorized for detection and/or diagnosis of SARS-CoV-2 by FDA under an Emergency Use Authorization (EUA). This EUA will remain in effect (meaning this test can be used) for the duration of the COVID-19 declaration under Section 564(b)(1) of the Act, 21 U.S.C. section 360bbb-3(b)(1), unless the authorization is terminated or revoked.  Performed at [facility], [address], [place], [place] [address]   Urine Culture     Status: Abnormal   Collection  Time: 02/25/24  6:02 PM   Specimen: In/Out Cath Urine  Result Value Ref Range Status   Specimen Description   Final    IN/OUT CATH URINE Performed at [facility], [address] [place], [place] [address]    Special Requests   Final    NONE Performed at [facility], [address], [place], [place] [address]    Culture >=100,000 COLONIES/mL PROTEUS MIRABILIS (A)  Final   Report Status 02/27/2024 FINAL  Final   Organism ID, Bacteria PROTEUS MIRABILIS (A)  Final      Susceptibility   Proteus mirabilis - MIC*    AMPICILLIN  <=2 SENSITIVE Sensitive     CEFAZOLIN 8 SENSITIVE Sensitive     CEFEPIME <=0.12 SENSITIVE Sensitive     CEFTRIAXONE  <=0.25 SENSITIVE Sensitive     CIPROFLOXACIN <=0.25 SENSITIVE Sensitive     GENTAMICIN <=1 SENSITIVE Sensitive     IMIPENEM 2 SENSITIVE Sensitive     NITROFURANTOIN RESISTANT Resistant     TRIMETH /SULFA  >=320 RESISTANT Resistant     AMPICILLIN /SULBACTAM <=2 SENSITIVE Sensitive     PIP/TAZO <=4 SENSITIVE Sensitive ug/mL    * >=100,000 COLONIES/mL PROTEUS MIRABILIS  MRSA Next Gen by PCR, Nasal     Status: None   Collection Time: 02/25/24 11:52 PM   Specimen: Nasal Mucosa; Nasal Swab  Result Value Ref Range Status   MRSA by PCR Next Gen NOT DETECTED NOT DETECTED Final    Comment: (NOTE) The GeneXpert  MRSA Assay (FDA approved for NASAL specimens only), is one component of a comprehensive MRSA colonization surveillance program. It is not intended to diagnose MRSA infection nor to guide or monitor treatment for MRSA infections. Test performance is not FDA approved in patients less than 39 years old. Performed at [facility], [address], [place], [place] [address]   Culture, blood (Routine X 2)     Status: None (Preliminary result)   Collection Time: 02/26/24  5:19 AM   Specimen: BLOOD RIGHT HAND  Result Value Ref Range Status   Specimen Description BLOOD RIGHT HAND  Final   Special Requests    Final    BOTTLES DRAWN AEROBIC AND ANAEROBIC Blood Culture adequate volume   Culture   Final    NO GROWTH 1 DAY Performed at [facility], [address] [place], [place] [address]    Report Status PENDING  Incomplete  Culture, blood (Routine X 2)     Status: None (Preliminary result)   Collection Time: 02/26/24  5:24 AM   Specimen: BLOOD RIGHT HAND  Result Value Ref Range Status   Specimen Description BLOOD RIGHT HAND  Final   Special Requests   Final    BOTTLES DRAWN AEROBIC AND ANAEROBIC Blood Culture adequate volume   Culture   Final    NO GROWTH 1 DAY Performed at [facility], [address] [place], [place] [address]    Report Status PENDING  Incomplete   Creatinine: Recent Labs    02/25/24 1529 02/26/24 0522 02/27/24 0841  CREATININE 1.50* 2.36* 2.60*    Impression/Assessment:  6 mm right proximal stone with hydronephrosis, bacteremia, sepsis and encephalopathy-status post right nephrostomy tube-  Plan:  Continue right nephrostomy tube.  If patient makes a meaningful recovery he will need ureteroscopy or shockwave lithotripsy to deal with the stone or he could undergo periodic nephrostomy tube change if he were too frail for the OR.  Will follow peripherally.  Please page GU with any questions, concerns or changes in patient's status and/or if patient recovers and is nearing discharge for follow-up planning.  Donnice Brooks 02/27/2024, 5:24 PM

## 2024-02-27 NOTE — Progress Notes (Signed)
 LTM VIDEO EEG discontinued - no skin breakdown at [facility]

## 2024-02-27 NOTE — Progress Notes (Signed)
 Orthopedic Tech Progress Note Patient Details:  Alex Frazier 1945/12/14 [id number] Applied PRAFO boots bilaterally per order.  Ortho Devices Type of Ortho Device: Prafo boot/shoe Ortho Device/Splint Location: BLE Ortho Device/Splint Interventions: Ordered, Application, Adjustment   Post Interventions Patient Tolerated: Well Instructions Provided: Adjustment of device, Care of device, Poper ambulation with device  [name] 02/27/2024, 5:26 PM

## 2024-02-27 NOTE — Progress Notes (Signed)
 LTM maint complete - no skin breakdown under:  O1, Pz

## 2024-02-27 NOTE — Progress Notes (Signed)
 NEUROLOGY CONSULT FOLLOW UP NOTE   Date of service: February 27, 2024 Patient Name: Alex Frazier MRN:  [id number] DOB:  1945-12-03  Interval Hx/subjective   No seizures on LTM. Will discontinue LTM EEG. Nephrostomy tube placed yesterday for ureteral stone and hydronephrosis. MRI Brain w + w/o C not concerning for meningitis.  Wife at bedside reports was more tired for the last 2-3 days prior to this and urine smelled strong. She had noticed that he was having more trouble holding onto the lift that they use to transfer him from bed to wheelchair.  Vitals   Vitals:   02/27/24 1605 02/27/24 1615 02/27/24 1630 02/27/24 1645  BP:  125/62 129/73 134/77  Pulse:  82 78 78  Resp:  (!) 27 (!) 25 (!) 25  Temp: (!) 97.4 F (36.3 C)     TempSrc: Axillary     SpO2:  93% 95% 95%  Weight:         Body mass index is 21.6 kg/m.  Physical Exam   General: appears ill and frail HENT: Normal oropharynx and mucosa. Normal external appearance of ears and nose. Neck: Supple, no pain or tenderness CV: No JVD. No peripheral edema. Pulmonary: Symmetric Chest rise. Normal respiratory effort. Abdomen: Soft to touch, non-tender. Ext: No cyanosis, edema, or deformity Skin: No rash. Normal palpation of skin.  Musculoskeletal: Normal digits and nails by inspection. No clubbing.  Neurologic Examination  Mental status/Cognition: eyes closed, moans and grimaces to noxious stimuli. Does not follow commands. Partially opens eyes to loud voice. Speech/language: mute, no speech Cranial nerves:   CN II Pupils equal and reactive to light, unable to assess for VF deficits.   CN III,IV,VI EOM intact to dolls eyes.   CN V normal sensation in V1, V2, and V3 segments bilaterally   CN VII no asymmetry, no nasolabial fold flattening   CN VIII Does not open eyes or responds to voice   CN IX & X Intact gag   CN XI Head midline   CN XII Does not protrude tongue on command.   Sensory/Motor:  Muscle bulk: localizes in  BL uppers. Slight movement in BL lower extremities. Does not withdraw to pain.  Coordination/Complex Motor:  Unable to assess.  Medications  Current Facility-Administered Medications:    carbidopa -levodopa  (SINEMET  IR) 25-100 MG per tablet immediate release 1.5 tablet, 1.5 tablet, Per NG tube, TID, Payne, John D, PA-C, 1.5 tablet at 02/27/24 1316   [START ON 02/28/2024] cefTRIAXone  (ROCEPHIN ) 2 g in sodium chloride  0.9 % 100 mL IVPB, 2 g, Intravenous, Q24H, Dewald, Jonathan B, MD   Chlorhexidine  Gluconate Cloth 2 % PADS 6 each, 6 each, Topical, Daily, Joshi, Rutwij, MD, 6 each at 02/27/24 1422   docusate (COLACE) 50 MG/5ML liquid 100 mg, 100 mg, Per Tube, BID PRN, Volanda, Rutwij, MD   feeding supplement (OSMOLITE 1.5 CAL) liquid 1,000 mL, 1,000 mL, Per Tube, Continuous, Kara Dorn NOVAK, MD, Last Rate: 45 mL/hr at 02/27/24 1535, Rate Change at 02/27/24 1535   feeding supplement (PROSource TF20) liquid 60 mL, 60 mL, Per Tube, Daily, Kara Dorn B, MD, 60 mL at 02/27/24 0944   fludrocortisone  (FLORINEF ) tablet 200 mcg, 200 mcg, Per Tube, Daily, Payne, John D, PA-C, 200 mcg at 02/27/24 1019   heparin  injection 5,000 Units, 5,000 Units, Subcutaneous, Q8H, Dewald, Jonathan B, MD, 5,000 Units at 02/27/24 1422   hydrocortisone  sodium succinate  (SOLU-CORTEF ) 100 MG injection 100 mg, 100 mg, Intravenous, Daily, Babcock, Peter E, NP, 100 mg  at 02/27/24 0942   insulin  aspart (novoLOG ) injection 0-9 Units, 0-9 Units, Subcutaneous, Q4H, Joshi, Rutwij, MD, 2 Units at 02/27/24 1700   liver oil-zinc  oxide (DESITIN) 40 % ointment, , Topical, BID, Volanda, Rutwij, MD, Given at 02/27/24 1422   midodrine  (PROAMATINE ) tablet 10 mg, 10 mg, Per Tube, Q8H, Joshi, Rutwij, MD, 10 mg at 02/27/24 1422   multivitamin with minerals tablet 1 tablet, 1 tablet, Per Tube, Daily, Kara Dorn NOVAK, MD, 1 tablet at 02/27/24 0944   norepinephrine  (LEVOPHED ) 4mg  in (0.016 mg/mL) premix infusion, 0-20 mcg/min, Intravenous,  Titrated, Paliwal, Aditya, MD, Last Rate: 3.75 mL/hr at 02/27/24 1600, 1 mcg/min at 02/27/24 1600   Oral care mouth rinse, 15 mL, Mouth Rinse, PRN, Volanda, Rutwij, MD   polyethylene glycol (MIRALAX  / GLYCOLAX ) packet 17 g, 17 g, Per Tube, Daily PRN, Volanda, Rutwij, MD   sodium chloride  flush (NS) 0.9 % injection 5 mL, 5 mL, Intracatheter, Q8H, Johann Sieving, MD, 5 mL at 02/27/24 0602   sodium chloride  HYPERTONIC 3 % nebulizer solution 4 mL, 4 mL, Nebulization, BID, Kara Dorn B, MD, 4 mL at 02/27/24 1117   thiamine  (VITAMIN B1) tablet 100 mg, 100 mg, Per Tube, Daily, Kara Dorn B, MD, 100 mg at 02/27/24 0944  Labs and Diagnostic Imaging   CBC:  Recent Labs  Lab 02/25/24 1810 02/26/24 0036 02/26/24 0522  WBC 5.8  --  20.5*  NEUTROABS 5.4  --  18.9*  HGB 11.4* 11.9* 12.0*  HCT 35.5* 35.0* 36.6*  MCV 89.0  --  86.5  PLT 144*  --  140*    Basic Metabolic Panel:  Lab Results  Component Value Date   NA 136 02/27/2024   K 4.4 02/27/2024   CO2 19 (L) 02/27/2024   GLUCOSE 128 (H) 02/27/2024   BUN 44 (H) 02/27/2024   CREATININE 2.60 (H) 02/27/2024   CALCIUM  7.2 (L) 02/27/2024   GFRNONAA 25 (L) 02/27/2024   GFRAA 99 11/27/2020   Lipid Panel:  Lab Results  Component Value Date   LDLCALC 67 04/25/2019   HgbA1c: No results found for: HGBA1C Urine Drug Screen: No results found for: LABOPIA, COCAINSCRNUR, LABBENZ, AMPHETMU, THCU, LABBARB  Alcohol Level No results found for: [facility] INR  Lab Results  Component Value Date   INR 1.3 (H) 02/26/2024   APTT  Lab Results  Component Value Date   APTT 28 05/14/2023   AED levels: No results found for: PHENYTOIN, ZONISAMIDE, LAMOTRIGINE, LEVETIRACETA  MRI Brain(Personally reviewed): 1. No acute findings of meningitis or CNS infection. 2. 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 3. Moderate chronic small vessel disease.  cEEG:  This study is   suggestive of moderate to severe diffuse encephalopathy. No seizures or epileptiform discharges were seen throughout the recording.   Assessment   Alex Frazier is a 78 y.o. male with hx of parkinsons dementia, neuropathy, HLD presenting with fever and altered mental status. Found to be in septic shock with proteus bacteremia, R hydronephrosis/pyelonephritis. There was initial concern for possible meningitis and seizure given noted tremoring with some left gaze. However, suspect noted hydronphrosis and ureteral stone and proteus bacteremia likely explain AMS rather than meningitis. He has been on cEEG for over 40 hours with no seizures or epileptiform discharges. MRI Brain w + w/o contrast is not concerning for meningitis/encephalitis.  Recommendations  - narrow down from meningitis to pyelonephritis coverage per primary team. - LP likely low yield, given clear alternative  source of infection. - B12, folate, TSH, thiamine  - discontinue LTM - continue home Carbidopa /levodopa  through NG tube. - encephalopathy will take a long time to improve with his baseline comorbidities. - we will continue to follow along. ______________________________________________________________________  Plan discussed with Dr. Kara with critical care team and with patient's wife at the bedside.  Signed, Conner Neiss, MD [facility]

## 2024-02-28 DIAGNOSIS — A4159 Other Gram-negative sepsis: Secondary | ICD-10-CM | POA: Diagnosis not present

## 2024-02-28 DIAGNOSIS — R6521 Severe sepsis with septic shock: Secondary | ICD-10-CM | POA: Diagnosis not present

## 2024-02-28 DIAGNOSIS — G20A1 Parkinson's disease without dyskinesia, without mention of fluctuations: Secondary | ICD-10-CM | POA: Diagnosis not present

## 2024-02-28 DIAGNOSIS — R569 Unspecified convulsions: Secondary | ICD-10-CM | POA: Diagnosis not present

## 2024-02-28 DIAGNOSIS — F0280 Dementia in other diseases classified elsewhere without behavioral disturbance: Secondary | ICD-10-CM | POA: Diagnosis not present

## 2024-02-28 DIAGNOSIS — G9340 Encephalopathy, unspecified: Secondary | ICD-10-CM | POA: Diagnosis not present

## 2024-02-28 DIAGNOSIS — B964 Proteus (mirabilis) (morganii) as the cause of diseases classified elsewhere: Secondary | ICD-10-CM | POA: Diagnosis not present

## 2024-02-28 LAB — CBC WITH DIFFERENTIAL/PLATELET
Abs Immature Granulocytes: 0.07 10*3/uL (ref 0.00–0.07)
Basophils Absolute: 0.1 10*3/uL (ref 0.0–0.1)
Basophils Relative: 1 %
Eosinophils Absolute: 0 10*3/uL (ref 0.0–0.5)
Eosinophils Relative: 0 %
HCT: 31 % — ABNORMAL LOW (ref 39.0–52.0)
Hemoglobin: 10.6 g/dL — ABNORMAL LOW (ref 13.0–17.0)
Immature Granulocytes: 0 %
Lymphocytes Relative: 3 %
Lymphs Abs: 0.6 10*3/uL — ABNORMAL LOW (ref 0.7–4.0)
MCH: 28.6 pg (ref 26.0–34.0)
MCHC: 34.2 g/dL (ref 30.0–36.0)
MCV: 83.8 fL (ref 80.0–100.0)
Monocytes Absolute: 0.4 10*3/uL (ref 0.1–1.0)
Monocytes Relative: 2 %
Neutro Abs: 19 10*3/uL — ABNORMAL HIGH (ref 1.7–7.7)
Neutrophils Relative %: 94 %
Platelets: 111 10*3/uL — ABNORMAL LOW (ref 150–400)
RBC: 3.7 MIL/uL — ABNORMAL LOW (ref 4.22–5.81)
RDW: 14.8 % (ref 11.5–15.5)
Smear Review: DECREASED
WBC Morphology: INCREASED
WBC: 20.2 10*3/uL — ABNORMAL HIGH (ref 4.0–10.5)
nRBC: 0 % (ref 0.0–0.2)

## 2024-02-28 LAB — BASIC METABOLIC PANEL WITH GFR
Anion gap: 10 (ref 5–15)
BUN: 56 mg/dL — ABNORMAL HIGH (ref 8–23)
CO2: 22 mmol/L (ref 22–32)
Calcium: 7.7 mg/dL — ABNORMAL LOW (ref 8.9–10.3)
Chloride: 107 mmol/L (ref 98–111)
Creatinine, Ser: 2.63 mg/dL — ABNORMAL HIGH (ref 0.61–1.24)
GFR, Estimated: 24 mL/min — ABNORMAL LOW (ref >60)
Glucose, Bld: 117 mg/dL — ABNORMAL HIGH (ref 70–99)
Potassium: 3.4 mmol/L — ABNORMAL LOW (ref 3.5–5.1)
Sodium: 139 mmol/L (ref 135–145)

## 2024-02-28 LAB — PHOSPHORUS: Phosphorus: 1.7 mg/dL — ABNORMAL LOW (ref 2.5–4.6)

## 2024-02-28 LAB — MAGNESIUM: Magnesium: 2.2 mg/dL (ref 1.7–2.4)

## 2024-02-28 LAB — GLUCOSE, CAPILLARY
Glucose-Capillary: 181 mg/dL — ABNORMAL HIGH (ref 70–99)
Glucose-Capillary: 141 mg/dL — ABNORMAL HIGH (ref 70–99)
Glucose-Capillary: 129 mg/dL — ABNORMAL HIGH (ref 70–99)
Glucose-Capillary: 142 mg/dL — ABNORMAL HIGH (ref 70–99)
Glucose-Capillary: 156 mg/dL — ABNORMAL HIGH (ref 70–99)
Glucose-Capillary: 139 mg/dL — ABNORMAL HIGH (ref 70–99)

## 2024-02-28 LAB — LEGIONELLA PNEUMOPHILA SEROGP 1 UR AG: L. pneumophila Serogp 1 Ur Ag: NEGATIVE

## 2024-02-28 MED ORDER — POTASSIUM CHLORIDE 20 MEQ PO PACK
20.0000 meq | PACK | Freq: Once | ORAL | Status: AC
  Administered 2024-02-28: 20 meq
  Filled 2024-02-28: qty 1

## 2024-02-28 MED ORDER — PANCRELIPASE (LIP-PROT-AMYL) 10440-39150 UNITS PO TABS
20880.0000 [IU] | ORAL_TABLET | Freq: Once | ORAL | Status: AC
  Administered 2024-02-28: 20880 [IU]
  Filled 2024-02-28: qty 2

## 2024-02-28 MED ORDER — SODIUM PHOSPHATES 45 MMOLE/15ML IV SOLN
30.0000 mmol | Freq: Once | INTRAVENOUS | Status: AC
  Administered 2024-02-28: 30 mmol via INTRAVENOUS
  Filled 2024-02-28: qty 10

## 2024-02-28 MED ORDER — SODIUM BICARBONATE 650 MG PO TABS
650.0000 mg | ORAL_TABLET | Freq: Once | ORAL | Status: AC
  Administered 2024-02-28: 650 mg
  Filled 2024-02-28: qty 1

## 2024-02-28 NOTE — Progress Notes (Signed)
 NEUROLOGY CONSULT FOLLOW UP NOTE   Date of service: February 28, 2024 Patient Name: Alex Frazier MRN:  [id number] DOB:  1945-09-16  Interval Hx/subjective   Encephalopathic, somewhat improved.  Vitals   Vitals:   02/28/24 1745 02/28/24 1800 02/28/24 1815 02/28/24 1830  BP: (!) 144/74 (!) 151/77 (!) 146/86 (!) 155/85  Pulse: 90 89 91 83  Resp: (!) 32 (!) 32 (!) 31 (!) 27  Temp:      TempSrc:      SpO2: 93% 93% 94% 95%  Weight:         Body mass index is 21.6 kg/m.  Physical Exam   General: appears ill and frail HENT: Normal oropharynx and mucosa. Normal external appearance of ears and nose. Neck: Supple, no pain or tenderness CV: No JVD. No peripheral edema. Pulmonary: Symmetric Chest rise. Normal respiratory effort. Abdomen: Soft to touch, non-tender. Ext: No cyanosis, edema, or deformity Skin: No rash. Normal palpation of skin.  Musculoskeletal: Normal digits and nails by inspection. No clubbing.  Neurologic Examination  Mental status/Cognition: eyes closed, moans and grimaces to noxious stimuli. Does not follow commands. Partially opens eyes to loud voice. Moves head left to right to nares stimulation. Speech/language: mute, no speech Cranial nerves:   CN II Pupils equal and reactive to light, unable to assess for VF deficits.   CN III,IV,VI EOM intact to dolls eyes.   CN V normal sensation in V1, V2, and V3 segments bilaterally   CN VII no asymmetry, no nasolabial fold flattening   CN VIII Does not open eyes or responds to voice   CN IX & X Intact gag   CN XI Head midline   CN XII Does not protrude tongue on command.   Sensory/Motor:  Muscle bulk: localizes in BL uppers. Slight movement in BL lower extremities. Does not withdraw to pain.  Coordination/Complex Motor:  Unable to assess.  Medications  Current Facility-Administered Medications:    carbidopa -levodopa  (SINEMET  IR) 25-100 MG per tablet immediate release 1.5 tablet, 1.5 tablet, Per NG tube, TID,  Payne, John D, PA-C, 1.5 tablet at 02/28/24 1209   cefTRIAXone  (ROCEPHIN ) 2 g in sodium chloride  0.9 % 100 mL IVPB, 2 g, Intravenous, Q24H, Kara Dorn NOVAK, MD, Stopped at 02/28/24 0306   Chlorhexidine  Gluconate Cloth 2 % PADS 6 each, 6 each, Topical, Daily, Joshi, Rutwij, MD, 6 each at 02/28/24 1230   docusate (COLACE) 50 MG/5ML liquid 100 mg, 100 mg, Per Tube, BID PRN, Volanda, Rutwij, MD   feeding supplement (OSMOLITE 1.5 CAL) liquid 1,000 mL, 1,000 mL, Per Tube, Continuous, Kara Dorn NOVAK, MD, Last Rate: 45 mL/hr at 02/28/24 1800, Infusion Verify at 02/28/24 1800   feeding supplement (PROSource TF20) liquid 60 mL, 60 mL, Per Tube, Daily, Kara Dorn B, MD, 60 mL at 02/28/24 1013   fludrocortisone  (FLORINEF ) tablet 200 mcg, 200 mcg, Per Tube, Daily, Payne, John D, PA-C, 200 mcg at 02/28/24 1013   heparin  injection 5,000 Units, 5,000 Units, Subcutaneous, Q8H, Dewald, Jonathan B, MD, 5,000 Units at 02/28/24 1402   hydrocortisone  sodium succinate  (SOLU-CORTEF ) 100 MG injection 100 mg, 100 mg, Intravenous, Daily, Babcock, Peter E, NP, 100 mg at 02/28/24 1013   insulin  aspart (novoLOG ) injection 0-9 Units, 0-9 Units, Subcutaneous, Q4H, Joshi, Rutwij, MD, 1 Units at 02/28/24 1620   liver oil-zinc  oxide (DESITIN) 40 % ointment, , Topical, BID, Volanda, Rutwij, MD, Given at 02/28/24 1230   midodrine  (PROAMATINE ) tablet 10 mg, 10 mg, Per Tube, Q8H, Volanda Sender, MD,  10 mg at 02/28/24 1402   multivitamin with minerals tablet 1 tablet, 1 tablet, Per Tube, Daily, Kara Dorn NOVAK, MD, 1 tablet at 02/28/24 1013   norepinephrine  (LEVOPHED ) 4mg  in (0.016 mg/mL) premix infusion, 0-20 mcg/min, Intravenous, Titrated, Paliwal, Aditya, MD, Stopped at 02/28/24 1258   Oral care mouth rinse, 15 mL, Mouth Rinse, PRN, Volanda, Rutwij, MD   polyethylene glycol (MIRALAX  / GLYCOLAX ) packet 17 g, 17 g, Per Tube, Daily PRN, Volanda, Rutwij, MD   sodium chloride  flush (NS) 0.9 % injection 5 mL, 5 mL, Intracatheter,  Q8H, Johann Sieving, MD, 5 mL at 02/28/24 0547   sodium chloride  HYPERTONIC 3 % nebulizer solution 4 mL, 4 mL, Nebulization, BID, Kara Dorn B, MD, 4 mL at 02/28/24 0808   thiamine  (VITAMIN B1) tablet 100 mg, 100 mg, Per Tube, Daily, Kara Dorn B, MD, 100 mg at 02/28/24 1013  Labs and Diagnostic Imaging   CBC:  Recent Labs  Lab 02/26/24 0522 02/28/24 1316  WBC 20.5* 20.2*  NEUTROABS 18.9* 19.0*  HGB 12.0* 10.6*  HCT 36.6* 31.0*  MCV 86.5 83.8  PLT 140* 111*    Basic Metabolic Panel:  Lab Results  Component Value Date   NA 139 02/28/2024   K 3.4 (L) 02/28/2024   CO2 22 02/28/2024   GLUCOSE 117 (H) 02/28/2024   BUN 56 (H) 02/28/2024   CREATININE 2.63 (H) 02/28/2024   CALCIUM  7.7 (L) 02/28/2024   GFRNONAA 24 (L) 02/28/2024   GFRAA 99 11/27/2020   Lipid Panel:  Lab Results  Component Value Date   LDLCALC 67 04/25/2019   HgbA1c: No results found for: HGBA1C Urine Drug Screen: No results found for: LABOPIA, COCAINSCRNUR, LABBENZ, AMPHETMU, THCU, LABBARB  Alcohol Level No results found for: [facility] INR  Lab Results  Component Value Date   INR 1.3 (H) 02/26/2024   APTT  Lab Results  Component Value Date   APTT 28 05/14/2023   AED levels: No results found for: PHENYTOIN, ZONISAMIDE, LAMOTRIGINE, LEVETIRACETA  MRI Brain(Personally reviewed): 1. No acute findings of meningitis or CNS infection. 2. 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 3. Moderate chronic small vessel disease.  cEEG:  This study is  suggestive of moderate to severe diffuse encephalopathy. No seizures or epileptiform discharges were seen throughout the recording.   Assessment   Alex Frazier is a 78 y.o. male with hx of parkinsons dementia, neuropathy, HLD presenting with fever and altered mental status. Found to be in septic shock with proteus bacteremia, R hydronephrosis/pyelonephritis. There was initial  concern for possible meningitis and seizure given noted tremoring with some left gaze. However, suspect noted hydronphrosis and ureteral stone and proteus bacteremia likely explain AMS rather than meningitis. He has been on cEEG for over 40 hours with no seizures or epileptiform discharges. MRI Brain w + w/o contrast is not concerning for meningitis/encephalitis.  Recommendations  - low suspicion for meningitis in the setting of noted pyelonephritis. Suspect profound encephalopathy is secondary to septic shock in the setting of comorbidities including parkinsons dementia, underlying mood disorder. - encephalopathy labs are non revealing. Thiamine  is pending. Primary team to follow up on it. - continue Sinemet  via NG tube. - we will signoff. Please feel free to contact us  with any questions or concerns. ______________________________________________________________________  Plan discussed with Dr. Kara with critical care team and with patient's wife at the bedside.  Signed, Naelle Diegel, MD [facility]

## 2024-02-28 NOTE — Procedures (Signed)
 Patient Name: TYTAN SANDATE  MRN: [id number]  Epilepsy Attending: Arlin MALVA Krebs  Referring Physician/Provider: Jerrie Lola CROME, MD  Duration: 02/27/2024 0943 to 02/28/2024 1728   Patient history: 77yo noted to have an episode of being nearly unresponsive having diffuse tremors with left gaze deviation. EEG to evaluate for seizure.   Level of alertness: Awake/ lethargic    AEDs during EEG study: None   Technical aspects: This EEG study was done with scalp electrodes positioned according to the 10-20 International system of electrode placement. Electrical activity was reviewed with band pass filter of 1-70Hz , sensitivity of 7 uV/mm, display speed of 48mm/sec with a 60Hz  notched filter applied as appropriate. EEG data were recorded continuously and digitally stored.  Video monitoring was available and reviewed as appropriate.   Description: EEG showed continuous generalized 3 to 6 Hz theta-delta slowing, at times with triphasic morphology. Hyperventilation and photic stimulation were not performed.      ABNORMALITY - Continuous slow, generalized   IMPRESSION: This study is  suggestive of moderate to severe diffuse encephalopathy. No seizures or epileptiform discharges were seen throughout the recording.   Neysa Arts O Darene Nappi

## 2024-02-28 NOTE — Progress Notes (Signed)
 NAME:  JOSEL KEO, MRN:  [id number], DOB:  08/13/1946, LOS: 3 ADMISSION DATE:  02/25/2024, CONSULTATION DATE:  6/19 REFERRING MD:  Dr. Randol, CHIEF COMPLAINT: Acute encephalopathy  History of Present Illness:  Patient is a 78 year old male with pertinent PMH Parkinson's disease, dementia, peripheral neuropathy, orthostatic hypotension, HLD presents to [facility] on 6/19 with AMS.  On 6/19 patient having complaints of fatigue and weakness.  Per patient's wife patient is altered but more altered than normal.  LKN 6/18 10 p.m.  EMS called finding patient had tremors and hypertensive SBP>200.  Given clonidine .  Patient also noted to be febrile 104 F.  Transferred to [facility] ED.  On arrival neuroexam showing left upward gaze deviation patient altered not following commands but moving extremities spontaneously.  Patient also with diffuse shaking.  CT head with no acute abnormality.  Concerned for possible seizure given ativan . Loaded w/ keppra . Neuro consulted. Glucose 133.  VBG 7.44, 30, 31, 20.  BP stable and tachy 110s.  Tmax 105.3 F and WBC 5.8.  Cultures obtained and placed on broad-spectrum antibiotics.  Given IV fluids.  LA 7.9 then 6.7.  UA with large leukocytes.  COVID/flu/RSV negative.  CXR unremarkable. Given AMS, elevated LA, and need for possible EEG, PCCM consulted for icu admission.  Pertinent  Medical History   Past Medical History:  Diagnosis Date   B12 deficiency    Borderline   Bulging discs    Degenerative disc disease, cervical    Dementia associated with Parkinson disease (HCC)    Gastritis    Hyperlipidemia    Neuropathy    Parkinson's disease (HCC)    Parkinsonian syndrome (HCC)    Peripheral neuropathy    TIA (transient ischemic attack)    TIA symptoms from hypotension   Significant Hospital Events: Including procedures, antibiotic start and stop dates in addition to other pertinent events   6/19 admitted w/ ams and sepsis from uti.  6/20 still obtunded. Has proteus and  staph species in blood. MRI ordered. Cont CSF antimicrobial coverage. Feeding tube placed. On LTM. Renal fxn a little worse getting urine studies and renal US . Had perc nephrostomy tube placed by IR.  Interim History / Subjective:   No acute events overnight He is currently off levophed  Not waking up much  Objective    Blood pressure (!) 110/53, pulse 80, temperature 98.5 F (36.9 C), temperature source Axillary, resp. rate (!) 30, weight 60.7 kg, SpO2 91%.        Intake/Output Summary (Last 24 hours) at 02/28/2024 0757 Last data filed at 02/28/2024 0700 Gross per 24 hour  Intake 1000.91 ml  Output 1015 ml  Net -14.09 ml   Filed Weights   02/26/24 0000 02/26/24 0500  Weight: 60.7 kg 60.7 kg    Examination: Generally elderly male, ill appearing, not alert  HENT NCAT PERRL no JVD Pulm course breath sounds, productive sounding cough  Card rrr Abd soft ext warm  Neuro lethargic, perrl w/d to pain Gu perc nephrostomy drain in place  Resolved problem list   Assessment and Plan   Septic shock due to proteus bacteremia from Right pyelonephritis due to obstructing stone Lactic Acidosis Plan - s/p percutaneous nephrostomy drain by IR on 6/20 - continue ceftriaxone   - he is on stress dose steroids, hydrocort  100mcg daily - continued on home florinef  and midodrine   Acute metabolic encephalopathy +/- seizure  in setting of sepsis complicated by Dementia, Parkinson's disease & Mood disorder CT head neg.  cEEG negative  for seizures Plan -continue sinemet  and prozac  via tube  -low concern for CNS infection, discontinued meningitis/encephalitis coverage seizure precautions  Acute respiratory failure w/ hypoxia Aspiration Pneumonia -currently on 4-5L [place] sats mid 90s CXR clear -MS barrier here and certainly at risk for aspiration  Plan -Asp precautions -Supplemental oxygen for sats > 92 -Pulse ox  -On ceftriaxone  -Continue hypertonic nebs for thick  secretions  AKI Pyelonephritis due to obstructing stone s/p perc nephrostomy tube Plan -Monitor Cr -MAP goal > 65 -Renal dose meds  -Strict I&O  Elevated alk phosph Improved Plan Continue to trend  Hyperglycemia Plan Ssi  Goal 140-180  HLD Plan: Resume when able to take oral   Anemia Thrombocytopenia Plan: Cont to trend  Trigger for transfusion is hgb < 7   Sacral and b/l heal wound POA Plan: WOC evaluated 6/20  Dressing procedure/placement/frequency:   Cleanse heels with soap and water, dry and apply silicone foam heel protectors.  Attempt to float heels at all times to reduce pressure.  Cleanse sacrum/coccyx/buttocks with soap and water, dry and apply Xeroform gauze Soila [id number]) to areas of purple maroon discoloration.   3. Desitin for surrounding intact skin.   Best Practice (right click and Reselect all SmartList Selections daily)   Diet/type: tubefeeds DVT prophylaxis SCD Pressure ulcer(s): present on admission  GI prophylaxis: N/A Lines: Central line Foley:  N/A Code Status:  full code Last date of multidisciplinary goals of care discussion [6/20 wife and daugher at bedside. State patient okay with full code but would not want to be on vent long term] Critical care time: 35 min     Dorn Chill, MD [facility] Office: [phone]   See Amion for personal pager PCCM on call pager [phone] until 7pm. Please call Elink 7p-7a. [phone]

## 2024-02-28 NOTE — Progress Notes (Signed)
   02/28/24 0808  Chest Physiotherapy Tx  $ Chest Physiotherapy (Vest, Bed, Metaneb) 1  CPT Delivery Source Bed  CPT Duration 10  CPT Chest Site Full range  Post-Treatment Respirations 24  Position Supine  CPT Treatment Tolerance Tolerated well

## 2024-02-29 DIAGNOSIS — N111 Chronic obstructive pyelonephritis: Secondary | ICD-10-CM

## 2024-02-29 DIAGNOSIS — B964 Proteus (mirabilis) (morganii) as the cause of diseases classified elsewhere: Secondary | ICD-10-CM | POA: Diagnosis not present

## 2024-02-29 DIAGNOSIS — A4159 Other Gram-negative sepsis: Secondary | ICD-10-CM | POA: Diagnosis not present

## 2024-02-29 DIAGNOSIS — R6521 Severe sepsis with septic shock: Secondary | ICD-10-CM | POA: Diagnosis not present

## 2024-02-29 DIAGNOSIS — G9341 Metabolic encephalopathy: Secondary | ICD-10-CM | POA: Diagnosis not present

## 2024-02-29 LAB — BASIC METABOLIC PANEL WITH GFR
Anion gap: 13 (ref 5–15)
BUN: 65 mg/dL — ABNORMAL HIGH (ref 8–23)
CO2: 21 mmol/L — ABNORMAL LOW (ref 22–32)
Calcium: 7.8 mg/dL — ABNORMAL LOW (ref 8.9–10.3)
Chloride: 109 mmol/L (ref 98–111)
Creatinine, Ser: 2.42 mg/dL — ABNORMAL HIGH (ref 0.61–1.24)
GFR, Estimated: 27 mL/min — ABNORMAL LOW (ref >60)
Glucose, Bld: 102 mg/dL — ABNORMAL HIGH (ref 70–99)
Potassium: 3.7 mmol/L (ref 3.5–5.1)
Sodium: 143 mmol/L (ref 135–145)

## 2024-02-29 LAB — GLUCOSE, CAPILLARY
Glucose-Capillary: 122 mg/dL — ABNORMAL HIGH (ref 70–99)
Glucose-Capillary: 125 mg/dL — ABNORMAL HIGH (ref 70–99)
Glucose-Capillary: 138 mg/dL — ABNORMAL HIGH (ref 70–99)
Glucose-Capillary: 142 mg/dL — ABNORMAL HIGH (ref 70–99)
Glucose-Capillary: 95 mg/dL (ref 70–99)
Glucose-Capillary: 98 mg/dL (ref 70–99)

## 2024-02-29 LAB — CULTURE, BLOOD (ROUTINE X 2)

## 2024-02-29 LAB — PHOSPHORUS: Phosphorus: 3.8 mg/dL (ref 2.5–4.6)

## 2024-02-29 LAB — MAGNESIUM: Magnesium: 2.5 mg/dL — ABNORMAL HIGH (ref 1.7–2.4)

## 2024-02-29 MED ORDER — HYDROMORPHONE HCL 1 MG/ML IJ SOLN
0.5000 mg | Freq: Four times a day (QID) | INTRAMUSCULAR | Status: DC | PRN
  Administered 2024-02-29 – 2024-03-04 (×10): 0.5 mg via INTRAVENOUS
  Filled 2024-02-29 (×12): qty 1

## 2024-02-29 MED ORDER — ASPIRIN 81 MG PO CHEW
81.0000 mg | CHEWABLE_TABLET | Freq: Every day | ORAL | Status: DC
  Administered 2024-02-29 – 2024-03-04 (×5): 81 mg
  Filled 2024-02-29 (×5): qty 1

## 2024-02-29 NOTE — Progress Notes (Signed)
 NAME:  Alex Frazier, MRN:  [id number], DOB:  [birth year], LOS: 4 ADMISSION DATE:  02/25/2024, CONSULTATION DATE:  02/25/2024 REFERRING MD:  Randol - EDP, CHIEF COMPLAINT: Acute encephalopathy  History of Present Illness:  Patient is a 78 year old male with PMHx significant for Parkinson's disease, dementia, peripheral neuropathy, orthostatic hypotension, HLD presents to [facility] on 6/19 with AMS.  On 6/19 patient having complaints of fatigue and weakness.  Per patient's wife patient is altered but more altered than normal.  LKN 6/18 10 p.m.  EMS called finding patient had tremors and hypertensive SBP > 200.  Given clonidine .  Patient also noted to be febrile 104F.  Transferred to [facility] ED.  On arrival neuro exam showing left upward gaze deviation patient altered not following commands but moving extremities spontaneously.  Patient also with diffuse shaking. CT Head with no acute abnormality.  Concerned for possible seizure, given Ativan . Loaded with Keppra . Neuro consulted. Glucose 133.  VBG 7.44, 30, 31, 20.  BP stable and tachy 110s.  Tmax 105.49F and WBC 5.8.  Cultures obtained and placed on broad-spectrum antibiotics.  Given IV fluids.  LA 7.9 then 6.7.  UA with large leukocytes.  COVID/flu/RSV negative.  CXR unremarkable. Given AMS, elevated LA, and need for possible EEG, PCCM consulted for ICU admission.  Pertinent Medical History:   Past Medical History:  Diagnosis Date   B12 deficiency    Borderline   Bulging discs    Degenerative disc disease, cervical    Dementia associated with Parkinson disease (HCC)    Gastritis    Hyperlipidemia    Neuropathy    Parkinson's disease (HCC)    Parkinsonian syndrome (HCC)    Peripheral neuropathy    TIA (transient ischemic attack)    TIA symptoms from hypotension   Significant Hospital Events: Including procedures, antibiotic start and stop dates in addition to other pertinent events   6/19 Admitted with AMS and sepsis from UTI.  6/20 Still  obtunded. Has proteus and staph species in blood. MRI ordered. Cont CSF antimicrobial coverage. Feeding tube placed. On LTM. Renal fxn a little worse getting urine studies and renal US . Had perc nephrostomy tube placed by IR.  Interim History / Subjective:  No significant events overnight Remains altered, responsive only to pain/noxious stimuli with groaning Low-dose Dilaudid 0.5mg  given for perceived pain Slightly increased O2 requirement, 4LNC Family (wife, daughter) at bedside Rash noted to BLE  Objective:   Blood pressure (!) 143/73, pulse 80, temperature 98.1 F (36.7 C), temperature source Axillary, resp. rate (!) 23, weight 60.7 kg, SpO2 91%.    FiO2 (%):  [36 %] 36 %   Intake/Output Summary (Last 24 hours) at 02/29/2024 1003 Last data filed at 02/29/2024 0800 Gross per 24 hour  Intake 1474.33 ml  Output 1410 ml  Net 64.33 ml   Filed Weights   02/26/24 0000 02/26/24 0500  Weight: 60.7 kg 60.7 kg   Physical Examination: General: Chronically ill-appearing elderly man in NAD. Intermittently appears uncomfortable. HEENT: [place]/AT, anicteric sclera, PERRL 3mm, dry mucous membranes. Neuro: Lethargic. Occasionally groaning spontaneously. Groans to noxious stimuli. Not following commands. No spontaneous movement of extremities noted. +Corneal, +Cough, and +Gag (weak) CV: RRR, no m/g/r. PULM: Breathing even and minimally labored on 4LNC. Lung fields diminished throughout. GI: Soft, nontender, nondistended. Normoactive bowel sounds. Extremities: Bilateral 1-2+ symmetric pitting LE edema noted. Skin: Warm/dry, scattered erythematous papules to BLE, +blanching.  Resolved problem List:   Assessment and Plan:   Septic shock due to proteus  bacteremia from Right pyelonephritis due to obstructing stone Lactic Acidosis S/p percutaneous nephrostomy drain by IR on 6/20. - Goal MAP > 65 - Fluid resuscitation as tolerated - Previously on Levophed , titrated off - SDS discontinued today,  6/23 - Continue home midodrine , Florinef  - Trend WBC, fever curve - F/u Cx data - Continue broad-spectrum antibiotics (ceftriaxone )  Acute metabolic encephalopathy +/- seizure  in setting of sepsis complicated by dementia, Parkinson's disease & Mood disorder CT Head NAICA. cEEG negative for seizures. - Continue Sinemet  - Continue Prozac  - Low level of concern for CNS infection, meningitis coverage discontinued - Seizure precautions - Avoid sedating medications as able  Acute respiratory failure with hypoxia Aspiration Pneumonia Currently on 4L [place] with SpO2 mid-90s, CXR clear. At risk for aspiration, mental status remains a significant barrier. - Supplemental O2 support for SpO2 > 90% - Pulmonary hygiene - HTS nebulization for secretion management - Ceftriaxone  as above - Aspiration precautions  Pyelonephritis due to obstructing stone s/p perc nephrostomy tube - Trend BMP - Replete electrolytes as indicated - Monitor I&Os - F/u urine studies - Avoid nephrotoxic agents as able - Ensure adequate renal perfusion  Elevated Alk Phos - Intermittent LFT monitoring  Hyperglycemia - SSI - CBGs Q4H - Goal CBG 140-180  HLD - Resume statin as appropriate  Anemia Thrombocytopenia - Trend H&H, Plt - Monitor for signs of active bleeding - Transfuse for Hgb < 7.0, Plt < 20K or hemodynamically significant bleeding  Sacral and b/l heel wound POA WOC evaluated 6/20. - Wound care per WOC recommendations - Pressure-relieving devices  Mild papular rash - Supportive care - Monitor closely - Med review, does not appear to be drug eruption  Best Practice: (right click and Reselect all SmartList Selections daily)   Diet/type: tubefeeds DVT prophylaxis SCD Pressure ulcer(s): present on admission  GI prophylaxis: N/A Lines: Central line Foley:  N/A Code Status:  full code Last date of multidisciplinary goals of care discussion [6/23 wife and daugher at bedside. State patient  okay with full code but would not want to be on vent long term]  Signature:   Corean CHRISTELLA Maurissa Ambrose, PA-C [facility] 02/29/24 10:03 AM  Please see [email or web address] for pager details.  From 7A-7P if no response, please call [phone] After hours, please call ELink [phone]

## 2024-02-29 NOTE — Progress Notes (Signed)
 Transition of Care [facility]) - Inpatient Brief Assessment   Patient Details  Name: Alex Frazier MRN: [id number] Date of Birth: Jun 01, 1946  [facility]) CM/SW Contact:    Robynn Eileen Hoose, RN Phone Number: 02/29/2024, 2:43 PM   Clinical Narrative: Patient from home with spouse. Presented with c/o altered mental status, sepsis and uti. Patient has history of HH services through [facility]. TOC will continue to monitor for discharge planning needs.    Transition of Care Asessment: Insurance and Status: Insurance coverage has been reviewed (Medicare A&B) Patient has primary care physician: Yes Nira Pickard) Home environment has been reviewed: Home Prior level of function:: Independent Prior/Current Home Services: (P) Current home services (History of [facility]) Social Drivers of Health Review: SDOH reviewed no interventions necessary Readmission risk has been reviewed: Yes Transition of care needs: (P) transition of care needs identified, TOC will continue to follow (Follow for discharge needs)

## 2024-02-29 NOTE — Plan of Care (Signed)
  Problem: Metabolic: Goal: Ability to maintain appropriate glucose levels will improve Outcome: Progressing   Problem: Nutritional: Goal: Maintenance of adequate nutrition will improve Outcome: Progressing Goal: Progress toward achieving an optimal weight will improve Outcome: Progressing   Problem: Tissue Perfusion: Goal: Adequacy of tissue perfusion will improve Outcome: Progressing   Problem: Clinical Measurements: Goal: Will remain free from infection Outcome: Progressing Goal: Diagnostic test results will improve Outcome: Progressing Goal: Respiratory complications will improve Outcome: Progressing Goal: Cardiovascular complication will be avoided Outcome: Progressing   Problem: Activity: Goal: Risk for activity intolerance will decrease Outcome: Progressing   Problem: Nutrition: Goal: Adequate nutrition will be maintained Outcome: Progressing   Problem: Pain Managment: Goal: General experience of comfort will improve and/or be controlled Outcome: Progressing   Problem: Safety: Goal: Ability to remain free from injury will improve Outcome: Progressing   Problem: Skin Integrity: Goal: Risk for impaired skin integrity will decrease Outcome: Progressing   Problem: Education: Goal: Ability to describe self-care measures that may prevent or decrease complications (Diabetes Survival Skills Education) will improve Outcome: Not Progressing Goal: Individualized Educational Video(s) Outcome: Not Progressing   Problem: Coping: Goal: Ability to adjust to condition or change in health will improve Outcome: Not Progressing   Problem: Fluid Volume: Goal: Ability to maintain a balanced intake and output will improve Outcome: Not Progressing   Problem: Health Behavior/Discharge Planning: Goal: Ability to identify and utilize available resources and services will improve Outcome: Not Progressing Goal: Ability to manage health-related needs will improve Outcome: Not  Progressing   Problem: Skin Integrity: Goal: Risk for impaired skin integrity will decrease Outcome: Not Progressing   Problem: Education: Goal: Knowledge of General Education information will improve Description: Including pain rating scale, medication(s)/side effects and non-pharmacologic comfort measures Outcome: Not Progressing   Problem: Health Behavior/Discharge Planning: Goal: Ability to manage health-related needs will improve Outcome: Not Progressing   Problem: Clinical Measurements: Goal: Ability to maintain clinical measurements within normal limits will improve Outcome: Not Progressing   Problem: Coping: Goal: Level of anxiety will decrease Outcome: Not Progressing   Problem: Elimination: Goal: Will not experience complications related to bowel motility Outcome: Not Progressing Goal: Will not experience complications related to urinary retention Outcome: Not Progressing

## 2024-03-01 ENCOUNTER — Inpatient Hospital Stay (HOSPITAL_COMMUNITY)

## 2024-03-01 DIAGNOSIS — Z515 Encounter for palliative care: Secondary | ICD-10-CM

## 2024-03-01 DIAGNOSIS — G20A1 Parkinson's disease without dyskinesia, without mention of fluctuations: Secondary | ICD-10-CM | POA: Diagnosis not present

## 2024-03-01 DIAGNOSIS — A419 Sepsis, unspecified organism: Secondary | ICD-10-CM

## 2024-03-01 DIAGNOSIS — J690 Pneumonitis due to inhalation of food and vomit: Secondary | ICD-10-CM | POA: Diagnosis not present

## 2024-03-01 DIAGNOSIS — J811 Chronic pulmonary edema: Secondary | ICD-10-CM | POA: Diagnosis not present

## 2024-03-01 DIAGNOSIS — Z7189 Other specified counseling: Secondary | ICD-10-CM

## 2024-03-01 DIAGNOSIS — A4159 Other Gram-negative sepsis: Secondary | ICD-10-CM | POA: Diagnosis not present

## 2024-03-01 DIAGNOSIS — R6521 Severe sepsis with septic shock: Secondary | ICD-10-CM

## 2024-03-01 DIAGNOSIS — J90 Pleural effusion, not elsewhere classified: Secondary | ICD-10-CM | POA: Diagnosis not present

## 2024-03-01 DIAGNOSIS — B964 Proteus (mirabilis) (morganii) as the cause of diseases classified elsewhere: Secondary | ICD-10-CM | POA: Diagnosis not present

## 2024-03-01 DIAGNOSIS — R0902 Hypoxemia: Secondary | ICD-10-CM | POA: Diagnosis not present

## 2024-03-01 DIAGNOSIS — J9601 Acute respiratory failure with hypoxia: Secondary | ICD-10-CM | POA: Diagnosis not present

## 2024-03-01 LAB — CBC
HCT: 37.6 % — ABNORMAL LOW (ref 39.0–52.0)
Hemoglobin: 12.4 g/dL — ABNORMAL LOW (ref 13.0–17.0)
MCH: 28.2 pg (ref 26.0–34.0)
MCHC: 33 g/dL (ref 30.0–36.0)
MCV: 85.6 fL (ref 80.0–100.0)
Platelets: 122 10*3/uL — ABNORMAL LOW (ref 150–400)
RBC: 4.39 MIL/uL (ref 4.22–5.81)
RDW: 15.5 % (ref 11.5–15.5)
WBC: 14.6 10*3/uL — ABNORMAL HIGH (ref 4.0–10.5)
nRBC: 0 % (ref 0.0–0.2)

## 2024-03-01 LAB — LIPID PANEL
Cholesterol: 135 mg/dL (ref 0–200)
HDL: 11 mg/dL — ABNORMAL LOW (ref >40)
LDL Cholesterol: 85 mg/dL (ref 0–99)
Total CHOL/HDL Ratio: 12.3 ratio
Triglycerides: 195 mg/dL — ABNORMAL HIGH (ref <150)
VLDL: 39 mg/dL (ref 0–40)

## 2024-03-01 LAB — BASIC METABOLIC PANEL WITH GFR
Anion gap: 13 (ref 5–15)
BUN: 71 mg/dL — ABNORMAL HIGH (ref 8–23)
CO2: 23 mmol/L (ref 22–32)
Calcium: 8.1 mg/dL — ABNORMAL LOW (ref 8.9–10.3)
Chloride: 110 mmol/L (ref 98–111)
Creatinine, Ser: 2.04 mg/dL — ABNORMAL HIGH (ref 0.61–1.24)
GFR, Estimated: 33 mL/min — ABNORMAL LOW (ref >60)
Glucose, Bld: 103 mg/dL — ABNORMAL HIGH (ref 70–99)
Potassium: 3.7 mmol/L (ref 3.5–5.1)
Sodium: 146 mmol/L — ABNORMAL HIGH (ref 135–145)

## 2024-03-01 LAB — GLUCOSE, CAPILLARY
Glucose-Capillary: 119 mg/dL — ABNORMAL HIGH (ref 70–99)
Glucose-Capillary: 119 mg/dL — ABNORMAL HIGH (ref 70–99)
Glucose-Capillary: 123 mg/dL — ABNORMAL HIGH (ref 70–99)
Glucose-Capillary: 151 mg/dL — ABNORMAL HIGH (ref 70–99)
Glucose-Capillary: 154 mg/dL — ABNORMAL HIGH (ref 70–99)
Glucose-Capillary: 98 mg/dL (ref 70–99)

## 2024-03-01 LAB — PHOSPHORUS: Phosphorus: 3.2 mg/dL (ref 2.5–4.6)

## 2024-03-01 LAB — MAGNESIUM: Magnesium: 2.4 mg/dL (ref 1.7–2.4)

## 2024-03-01 LAB — VITAMIN B1: Vitamin B1 (Thiamine): 160.7 nmol/L (ref 66.5–200.0)

## 2024-03-01 MED ORDER — PRAVASTATIN SODIUM 10 MG PO TABS
20.0000 mg | ORAL_TABLET | Freq: Every day | ORAL | Status: DC
  Administered 2024-03-01 – 2024-03-04 (×4): 20 mg
  Filled 2024-03-01 (×4): qty 2

## 2024-03-01 MED ORDER — FUROSEMIDE 10 MG/ML IJ SOLN
40.0000 mg | Freq: Once | INTRAMUSCULAR | Status: AC
  Administered 2024-03-01: 40 mg via INTRAVENOUS
  Filled 2024-03-01: qty 4

## 2024-03-01 MED ORDER — FREE WATER
200.0000 mL | Freq: Three times a day (TID) | Status: DC
  Administered 2024-03-01 – 2024-03-04 (×9): 200 mL

## 2024-03-01 MED ORDER — MIDODRINE HCL 5 MG PO TABS
5.0000 mg | ORAL_TABLET | Freq: Three times a day (TID) | ORAL | Status: DC
  Administered 2024-03-01: 5 mg
  Filled 2024-03-01 (×2): qty 1

## 2024-03-01 MED ORDER — SODIUM CHLORIDE 3 % IN NEBU
4.0000 mL | INHALATION_SOLUTION | Freq: Every day | RESPIRATORY_TRACT | Status: AC
  Administered 2024-03-01 – 2024-03-03 (×3): 4 mL via RESPIRATORY_TRACT
  Filled 2024-03-01 (×3): qty 4

## 2024-03-01 NOTE — Progress Notes (Signed)
 RT NOTE: RT to room due to PT desating. RT suctioned back of PT's mouth obtaining moderate amount of thick yellow tan secretions. RT attempted 6L [place] but sats did not improve. 14L/55% venturi mask then placed as PT is mouth breather but sats would not come above 89%. PT placed on 15L non-rebreather mask with sats staying at 92%. RN notified and contacted E-link to make MD aware.

## 2024-03-01 NOTE — Progress Notes (Signed)
     Subjective: First time meeting the [facility] family.  Patient is still unresponsive.  PureWick in place draining clear yellow urine.  Objective: Vital signs in last 24 hours: Temp:  [97.9 F (36.6 C)-98.6 F (37 C)] 98.3 F (36.8 C) (06/24 1200) Pulse Rate:  [65-81] 71 (06/24 1211) Resp:  [20-32] 26 (06/24 1211) BP: (128-179)/(65-89) 148/66 (06/24 1200) SpO2:  [84 %-100 %] 99 % (06/24 1211) FiO2 (%):  [36 %-92 %] 70 % (06/24 0819)  Assessment/Plan: 77y critically ill male s/p right ureteral stent for 6 mm obstructing stone.  Remains in ICU.  Reviewed case and plan with family, both of whom have also had kidney stones.  All questions were answered to their satisfaction.  # Right ureteral stone S/p right percutaneous nephrostomy tube.  Patient is still unresponsive.  Neurology has signed off.  Working diagnosis is that this is encephalopathy secondary to pyelonephritis. Follow-up with IR for PCNT exchange in approximately 6 weeks if necessary.  Otherwise will follow-up in clinic for definitive stone management/ureteroscopy on an outpatient basis. Please reach out to urology as he is nearing discharge so that we can post his case.  Intake/Output from previous day: 06/23 0701 - 06/24 0700 In: 1194 [NG/GT:1194] Out: 1770 [Urine:1770]  Intake/Output this shift: Total I/O In: 331 [NG/GT:231; IV Piggyback:100] Out: 1100 [Urine:1100]  Physical Exam:  General: Unresponsive CV: No cyanosis Lungs: equal chest rise, HHFNC Gu: Purewick in place draining clear yellow urine.  Right PCNT clear yellow with blush of hematuria  Lab Results: Recent Labs    02/28/24 1316 03/01/24 0558  HGB 10.6* 12.4*  HCT 31.0* 37.6*   BMET Recent Labs    02/28/24 1316 02/29/24 0956 03/01/24 0558  NA 139 143 146*  K 3.4* 3.7 3.7  CL 107 109 110  CO2 22 21* 23  GLUCOSE 117* 102* 103*  BUN 56* 65* 71*  CREATININE 2.63* 2.42* 2.04*  CALCIUM  7.7* 7.8* 8.1*  HGB 10.6*  --  12.4*  WBC 20.2*  --   14.6*     Studies/Results: DG CHEST PORT 1 VIEW Result Date: 03/01/2024 EXAM: 1 VIEW XRAY OF THE CHEST 03/01/2024 04:40:00 AM COMPARISON: 1 view chest x-ray 02/25/2024. CLINICAL HISTORY: 200808 Hypoxia 200808. FINDINGS: LUNGS AND PLEURA: Diffuse interstitial edema is new. Right greater than left pleural effusions are present. HEART AND MEDIASTINUM: No acute abnormality of the cardiac and mediastinal silhouettes. BONES AND SOFT TISSUES: No acute osseous abnormality. Small or fading tube projects over the stomach. IMPRESSION: 1. New diffuse interstitial edema. 2. Right greater than left pleural effusions. 3. Findings are consistent with volume overload and congestive heart failure. Infection is not excluded. Electronically signed by: Lonni Necessary MD 03/01/2024 05:27 AM EDT RP Workstation: [id number]      LOS: 5 days   Ole Bourdon, NP [facility] Pager: [phone]  03/01/2024, 1:16 PM

## 2024-03-01 NOTE — Progress Notes (Addendum)
 NAME:  Alex Frazier, MRN:  [id number], DOB:  1945-11-04, LOS: 5 ADMISSION DATE:  02/25/2024, CONSULTATION DATE:  02/25/2024 REFERRING MD:  Randol - EDP, CHIEF COMPLAINT: Acute encephalopathy  History of Present Illness:  Patient is a 78 year old male with PMHx significant for Parkinson's disease, dementia, peripheral neuropathy, orthostatic hypotension, HLD presents to [facility] on 6/19 with AMS.  On 6/19 patient having complaints of fatigue and weakness.  Per patient's wife patient is altered but more altered than normal.  LKN 6/18 10 p.m.  EMS called finding patient had tremors and hypertensive SBP > 200.  Given clonidine .  Patient also noted to be febrile 104F.  Transferred to [facility] ED.  On arrival neuro exam showing left upward gaze deviation patient altered not following commands but moving extremities spontaneously.  Patient also with diffuse shaking. CT Head with no acute abnormality.  Concerned for possible seizure, given Ativan . Loaded with Keppra . Neuro consulted. Glucose 133.  VBG 7.44, 30, 31, 20.  BP stable and tachy 110s.  Tmax 105.64F and WBC 5.8.  Cultures obtained and placed on broad-spectrum antibiotics.  Given IV fluids.  LA 7.9 then 6.7.  UA with large leukocytes.  COVID/flu/RSV negative.  CXR unremarkable. Given AMS, elevated LA, and need for possible EEG, PCCM consulted for ICU admission.  Pertinent Medical History:   Past Medical History:  Diagnosis Date   B12 deficiency    Borderline   Bulging discs    Degenerative disc disease, cervical    Dementia associated with Parkinson disease (HCC)    Gastritis    Hyperlipidemia    Neuropathy    Parkinson's disease (HCC)    Parkinsonian syndrome (HCC)    Peripheral neuropathy    TIA (transient ischemic attack)    TIA symptoms from hypotension   Significant Hospital Events: Including procedures, antibiotic start and stop dates in addition to other pertinent events   6/19 Admitted with AMS and sepsis from UTI.  6/20 Still  obtunded. Has proteus and staph species in blood. MRI ordered. Cont CSF antimicrobial coverage. Feeding tube placed. On LTM. Renal fxn a little worse getting urine studies and renal US . Had perc nephrostomy tube placed by IR. 6/24: significant increase in O2 requirement overnight now on 35L HHFNC from 4L yesterday. Had bedside GOC discussion with wife. Made DNR/DNI. Continuing all other care for now.   Interim History / Subjective:  Significant increase in O2 requirements overnight now on 35L, 70% HHFNC. Continues to be altered/nonverbal. Intermittently groans. CXR with bilateral infiltrates c/w aspiration pna/pneumonitis. Had GOC discussion with wife after assessing patient. Made DNR/DNI. Will continue other measures for now. Add free water for hypernatremia. Adding CPT and HTS nebs.   Objective:   Blood pressure (!) 158/80, pulse 68, temperature 98 F (36.7 C), temperature source Oral, resp. rate (!) 22, weight 60.7 kg, SpO2 99%.    FiO2 (%):  [36 %-92 %] 70 %   Intake/Output Summary (Last 24 hours) at 03/01/2024 0839 Last data filed at 03/01/2024 9342 Gross per 24 hour  Intake 1149 ml  Output 1710 ml  Net -561 ml   Filed Weights   02/26/24 0000 02/26/24 0500  Weight: 60.7 kg 60.7 kg   Physical Examination: General: acute on chronically ill-appearing elderly man, laying in bed HEENT: [place]/AT, anicteric sclera, PERRL 3mm, dry mucous membranes. Neuro: somnolent. Occasionally groaning spontaneously. No response to painful noxious stimulus. Not following commands. No spontaneous movement of extremities noted. +Corneal, +Cough, and +Gag (weak) CV: RRR, no m/g/r. PULM: resp  even and unlabored, rhonchi bilaterally, HHFNC GI: Soft, nontender, nondistended. Normoactive bowel sounds. Extremities: Bilateral 1-2+ symmetric pitting LE edema noted.  Resolved problem List:  Shock resolved Assessment and Plan:   Sepsis due to proteus bacteremia from Right pyelonephritis due to obstructing  stone Lactic Acidosis S/p percutaneous nephrostomy drain by IR on 6/20. Shock resolved, off pressors.  - continue rocephin   - continue florinef  and midodrine , home medications - map goal > 65  - trend wbc, fever curve  - repeat cultures negative   Acute hypoxic respiratory failure  Aspiration pneumonia  Significant increase in oxygen requirement 6/23-6/24 overnight now on 35L/70% HHFNC. CXR 6/23 with bilateral infiltrates consistent with pneumonia. Assume there is a parkinson component to his aspiration  - continue rocephin  as above  - adding CPT and HTS nebs to help with secretions  - aggressive pulmonary hygiene  - aspiration precautions  - DNI   Acute metabolic encephalopathy +/- seizure  in setting of sepsis complicated by dementia, Parkinson's disease & Mood disorder CT Head NAICA. cEEG negative for seizures. - Continue Sinemet  - Continue Prozac  - Low level of concern for CNS infection, meningitis coverage discontinued - Seizure precautions - Avoid sedating medications as able  Pyelonephritis due to obstructing stone s/p perc nephrostomy tube - Trend BMP - Replete electrolytes as indicated - Monitor I&Os - Avoid nephrotoxic agents as able - Ensure adequate renal perfusion  Hypernatremia  - adding free water 200 q8h   Hyperglycemia - SSI - CBGs Q4H - Goal CBG 140-180  HLD - restarted statin  Anemia Thrombocytopenia - Trend H&H, Plt - Monitor for signs of active bleeding - Transfuse for Hgb < 7.0, Plt < 20K or hemodynamically significant bleeding  Sacral and b/l heel wound POA WOC evaluated 6/20. - Wound care per WOC recommendations - Pressure-relieving devices  Mild papular rash - Supportive care - Monitor closely - Med review, does not appear to be drug eruption  Best Practice: (right click and Reselect all SmartList Selections daily)   Diet/type: tubefeeds and NPO DVT prophylaxis prophylactic heparin   Pressure ulcer(s): present on admission  GI  prophylaxis: N/A Lines: N/A Foley:  N/A Code Status:  limited Last date of multidisciplinary goals of care discussion [6/24 GOC discussion with wife on rounds, made DNR/DNI]  Signature:   CC time: 40 minutes   Tinnie FORBES Furth, PA-C [facility] 03/01/24 8:39 AM  Please see [email or web address] for pager details.  From 7A-7P if no response, please call [phone] After hours, please call ELink [phone]

## 2024-03-01 NOTE — Consult Note (Signed)
 Consultation Note Date: 03/01/2024   Patient Name: Alex Frazier  DOB: 07-04-1946  MRN: [id number]  Age / Sex: 78 y.o., male  PCP: Alex Butler DASEN, MD Referring Physician: Neda Jennet LABOR, MD  Reason for Consultation: Establishing goals of care  HPI/Patient Profile: 78 y.o. male  with past medical history of Parkinson's disease, dementia, peripheral neuropathy, orthostatic hypotension, HLD admitted on 02/25/2024 with AMS, fatigue, weakness found to have sepsis due to proteus bacteremia from R pyelonephritis with obstructing stone. Acute respiratory decline now requiring 35L/70% with concern for aspiration pneumonia in the setting of Parkinson's disease.   Clinical Assessment and Goals of Care: Consult received and chart review completed. I met today at Alex Frazier's bedside along with wife and daughter, Alex Frazier and Alex Frazier. Alex Frazier does not really arouse or respond during my visit. Towards the end he moaned and balled hands into fists but no purposeful responses noted.   We reviewed Alex Frazier's condition and Alex Frazier has been his primary caregiver at home. Alex Frazier has been caregiver for many family members including her mother and grandmother. She shares that Alex Frazier has been mostly wheelchair/bedbound. He has ambulated with PT but has not been safe to ambulate with rollater with family - only with PT. He has been able to feed himself although has been struggling more with food falling off utensils. They report that there was mention of mild dementia although they feel like his memory has been overall very good - always recognizes family and visitors. He is not a complainer. 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 He drove short distance as he preferred to be home to have dinner with his family and sleep in his own bed.   We discussed goals of care and wife is definitely having  thoughts of what is next. They have been surprised and taken off guard by his sudden decline at admission as well as overnight. Wife is wondering what comes next. We discussed the importance of watching and taking cues from him and his progress for what steps we take next. We discussed the importance of quality of life and concerns about what life will look like for him following this illness. Alex Frazier shares her fears that this is not heading down a good path. She shares her decision for DNR/DNI and is at peace with this decision. She is beginning to come to terms that there may be a more difficult path and decisions to make over the near future. We agree to take it one day at a time and to continue conversations. Alex Frazier plans to go home tonight - I encouraged her to get phone number to unit so if she awakens worried she can call to check on him for peace of mind. They are appreciative of PCCM and nursing care.   All questions/concerns addressed. Emotional support provided.   Primary Decision Maker HCPOA reported 1) wife Alex Frazier 2) daughter Alex Frazier    SUMMARY OF RECOMMENDATIONS   - DNR/DNI - Time for outcomes -  Ongoing GOC conversations  Code Status/Advance Care Planning: DNR   Symptom Management:  Per PCCM.   Prognosis:  Overall prognosis guarded.   Discharge Planning: To Be Determined      Primary Diagnoses: Present on Admission:  Hyperlipidemia  Dementia associated with Parkinson disease (HCC)  Idiopathic polyneuropathy  Autonomic dysfunction   I have reviewed the medical record, interviewed the patient and family, and examined the patient. The following aspects are pertinent.  Past Medical History:  Diagnosis Date   B12 deficiency    Borderline   Bulging discs    Degenerative disc disease, cervical    Dementia associated with Parkinson disease (HCC)    Gastritis    Hyperlipidemia    Neuropathy    Parkinson's disease (HCC)    Parkinsonian syndrome (HCC)    Peripheral  neuropathy    TIA (transient ischemic attack)    TIA symptoms from hypotension   Social History   Socioeconomic History   Marital status: Married    Spouse name: Not on file   Number of children: 3   Years of education: Not on file   Highest education level: High school graduate  Occupational History   Not on file  Tobacco Use   Smoking status: Never   Smokeless tobacco: Never  Vaping Use   Vaping status: Never Used  Substance and Sexual Activity   Alcohol use: No   Drug use: No   Sexual activity: Not Currently  Other Topics Concern   Not on file  Social History Narrative   Lives at home with his wife   Right handed   Caffeine: none   Social Drivers of Corporate investment banker Strain: Low Risk  (08/27/2023)   Overall Financial Resource Strain (CARDIA)    Difficulty of Paying Living Expenses: Not hard at all  Food Insecurity: No Food Insecurity (02/26/2024)   Hunger Vital Sign    Worried About Running Out of Food in the Last Year: Never true    Ran Out of Food in the Last Year: Never true  Transportation Needs: No Transportation Needs (02/26/2024)   PRAPARE - Administrator, Civil Service (Medical): No    Lack of Transportation (Non-Medical): No  Physical Activity: Inactive (08/27/2023)   Exercise Vital Sign    Days of Exercise per Week: 0 days    Minutes of Exercise per Session: 0 min  Stress: No Stress Concern Present (08/27/2023)   [employer] of Occupational Health - Occupational Stress Questionnaire    Feeling of Stress : Not at all  Social Connections: Moderately Isolated (02/26/2024)   Social Connection and Isolation Panel    Frequency of Communication with Friends and Family: Twice a week    Frequency of Social Gatherings with Friends and Family: Twice a week    Attends Religious Services: Never    Database administrator or Organizations: No    Attends Engineer, structural: Never    Marital Status: Married   Family History   Problem Relation Age of Onset   Parkinson's disease Mother    Parkinson's disease Father    Diabetes Sister    Heart disease Brother    Scheduled Meds:  aspirin  81 mg Per Tube Daily   carbidopa -levodopa   1.5 tablet Per NG tube TID   Chlorhexidine  Gluconate Cloth  6 each Topical Daily   feeding supplement (PROSource TF20)  60 mL Per Tube Daily   fludrocortisone   200 mcg Per Tube Daily  free water  200 mL Per Tube Q8H   heparin  injection (subcutaneous)  5,000 Units Subcutaneous Q8H   insulin  aspart  0-9 Units Subcutaneous Q4H   liver oil-zinc  oxide   Topical BID   midodrine   5 mg Per Tube Q8H   multivitamin with minerals  1 tablet Per Tube Daily   pravastatin   20 mg Per Tube Daily   sodium chloride  flush  5 mL Intracatheter Q8H   sodium chloride  HYPERTONIC  4 mL Nebulization Daily   thiamine   100 mg Per Tube Daily   Continuous Infusions:  cefTRIAXone  (ROCEPHIN )  IV Stopped (03/01/24 0141)   feeding supplement (OSMOLITE 1.5 CAL) 45 mL/hr at 03/01/24 1400   PRN Meds:.docusate, HYDROmorphone (DILAUDID) injection, mouth rinse, polyethylene glycol Allergies  Allergen Reactions   Tape Other (See Comments)    SKIN IS VERY THIN- TEARS VERY EASILY!!   Hydrocodone -Acetaminophen  Other (See Comments)    Hallucinations and confusion   Indomethacin Other (See Comments)    Unknown reaction   Lyrica  [Pregabalin ] Other (See Comments)    Vivid hallucinations   Nsaids Other (See Comments)    Per family patient gets hallucination/confusion.   Quetiapine  Other (See Comments)    HYPOtension!!   Review of Systems  Unable to perform ROS: Acuity of condition    Physical Exam Vitals and nursing note reviewed.  Constitutional:      General: He is not in acute distress.    Appearance: He is ill-appearing.   Cardiovascular:     Rate and Rhythm: Normal rate.  Pulmonary:     Effort: Tachypnea present. No accessory muscle usage or respiratory distress.     Comments: HHFNC Abdominal:      Palpations: Abdomen is soft.   Skin:    Comments: Edema improving - RUE weeping   Neurological:     Mental Status: He is unresponsive.     Vital Signs: BP 138/62   Pulse 72   Temp 98.3 F (36.8 C) (Axillary)   Resp (!) 28   Wt 60.7 kg   SpO2 97%   BMI 21.60 kg/m  Pain Scale: CPOT   Pain Score: 0-No pain   SpO2: SpO2: 97 % O2 Device:SpO2: 97 % O2 Flow Rate: .O2 Flow Rate (L/min): (S) 40 L/min  IO: Intake/output summary:  Intake/Output Summary (Last 24 hours) at 03/01/2024 1457 Last data filed at 03/01/2024 1400 Gross per 24 hour  Intake 1300 ml  Output 2560 ml  Net -1260 ml    LBM: Last BM Date : 02/29/24 Baseline Weight: Weight: 60.7 kg Most recent weight: Weight: 60.7 kg     Palliative Assessment/Data:    Time Total: 80 min  Greater than 50%  of this time was spent counseling and coordinating care related to the above assessment and plan.  Signed by: Bernarda Kitty, NP Palliative Medicine Team Pager # [phone] (M-F 8a-5p) Team Phone # [phone] (Nights/Weekends)

## 2024-03-01 NOTE — Progress Notes (Signed)
 Nutrition Follow Up  DOCUMENTATION CODES:  Severe malnutrition in context of chronic illness  INTERVENTION:   Continue tube feeding via Cortrak tube: Osmolite 1.5 at  45 ml/hr (1080 ml per day)  Prosource TF20 60 ml daily  Provides 1700 kcal, 87 gm protein, 820 ml free water daily  100 mg thiamine  daily x 7 days  MVI with minerals daily   Recommend free water flushes: 200 ml evey 6 hours Total free water 1620 ml   NUTRITION DIAGNOSIS:  Severe Malnutrition related to chronic illness as evidenced by severe fat depletion, severe muscle depletion. Remains applicable   GOAL:  Patient will meet greater than or equal to 90% of their needs Met with tube feeds  MONITOR:  TF tolerance, Diet advancement  REASON FOR ASSESSMENT:  Consult Enteral/tube feeding initiation and management  ASSESSMENT:  Pt with PMH of Parkinson's dz, dementia, autonomic dysfunction on midodrine , neuropathy admitted with fever and AMS.   6/19 admitted w/ AMS and sepsis 6/20 Cortrak placed, nephrostomy placed 6/24 transitioned to DNI/DNR  Pt resting in bed, but with continued altered mentation. Tube feeds running at goal rate with no reported issues, free water added to regimen. Spoke with RN who reports pt has required increased oxygen needs overnight (4L to 35L HHFNC). GOC discussion with family conducted, transitioned pt to DNR/DNI. Continue with current nutrition interventions.   Output: R Nephrostomy: 395 mL Urinary Catheter: 1,150 mL  Medications reviewed and include:  Lasix Novolog  SSI MVI w/ minerals Thiamine  Ceftriaxone   Labs reviewed:  CBG over last 24: 98-138 mg/dL Sodium 853  Diet Order:   Diet Order             Diet NPO time specified  Diet effective now                  EDUCATION NEEDS:   Not appropriate for education at this time  Skin:  Skin Assessment: Skin Integrity Issues: Skin Integrity Issues:: Stage II, Stage I Stage I: bilateral heels Stage II:  buttocks  Last BM:  type 6 6/23  Height:  Ht Readings from Last 1 Encounters:  12/08/23 5' 6 (1.676 m)   Weight:  Wt Readings from Last 1 Encounters:  02/26/24 60.7 kg   BMI:  Body mass index is 21.6 kg/m.  Estimated Nutritional Needs:   Kcal:  1700-1900  Protein:  80-100 grams  Fluid:  > 1.7 L/day  Josette Glance, MS, RDN, LDN Clinical Dietitian I Please reach out via secure chat

## 2024-03-01 NOTE — Plan of Care (Signed)
  Problem: Fluid Volume: Goal: Ability to maintain a balanced intake and output will improve Outcome: Progressing   Problem: Health Behavior/Discharge Planning: Goal: Ability to manage health-related needs will improve Outcome: Progressing   Problem: Metabolic: Goal: Ability to maintain appropriate glucose levels will improve Outcome: Progressing   Problem: Nutritional: Goal: Maintenance of adequate nutrition will improve Outcome: Progressing Goal: Progress toward achieving an optimal weight will improve Outcome: Progressing   Problem: Skin Integrity: Goal: Risk for impaired skin integrity will decrease Outcome: Progressing   Problem: Tissue Perfusion: Goal: Adequacy of tissue perfusion will improve Outcome: Progressing   Problem: Clinical Measurements: Goal: Cardiovascular complication will be avoided Outcome: Progressing   Problem: Activity: Goal: Risk for activity intolerance will decrease Outcome: Progressing   Problem: Nutrition: Goal: Adequate nutrition will be maintained Outcome: Progressing   Problem: Coping: Goal: Level of anxiety will decrease Outcome: Progressing   Problem: Elimination: Goal: Will not experience complications related to bowel motility Outcome: Progressing Goal: Will not experience complications related to urinary retention Outcome: Progressing   Problem: Pain Managment: Goal: General experience of comfort will improve and/or be controlled Outcome: Progressing   Problem: Safety: Goal: Ability to remain free from injury will improve Outcome: Progressing   Problem: Skin Integrity: Goal: Risk for impaired skin integrity will decrease Outcome: Progressing   Problem: Education: Goal: Ability to describe self-care measures that may prevent or decrease complications (Diabetes Survival Skills Education) will improve Outcome: Not Progressing Goal: Individualized Educational Video(s) Outcome: Not Progressing   Problem: Coping: Goal:  Ability to adjust to condition or change in health will improve Outcome: Not Progressing   Problem: Health Behavior/Discharge Planning: Goal: Ability to identify and utilize available resources and services will improve Outcome: Not Progressing   Problem: Education: Goal: Knowledge of General Education information will improve Description: Including pain rating scale, medication(s)/side effects and non-pharmacologic comfort measures Outcome: Not Progressing   Problem: Health Behavior/Discharge Planning: Goal: Ability to manage health-related needs will improve Outcome: Not Progressing   Problem: Clinical Measurements: Goal: Ability to maintain clinical measurements within normal limits will improve Outcome: Not Progressing Goal: Will remain free from infection Outcome: Not Progressing Goal: Diagnostic test results will improve Outcome: Not Progressing Goal: Respiratory complications will improve Outcome: Not Progressing

## 2024-03-01 NOTE — Progress Notes (Addendum)
 eLink Physician-Brief Progress Note Patient Name: Alex Frazier DOB: [birth year] MRN: [id number]   Date of Service  03/01/2024  HPI/Events of Note  78 year old male that initially presented with septic shock secondary to right-sided pyelonephritis and aspiration pneumonia.  Over the course of the night, has had escalating oxygen requirements and now requiring nonrebreather to maintain saturations.  Last imaging on 6/20.  No response to chest physiotherapy or nasotracheal suctioning.  eICU Interventions  Obtain stat chest radiograph  Advance to heated high flow   0429 -radiograph consistent with bilateral airspace disease, off pressors, may benefit from some diuresis.  Improved saturations on heated high flow nasal cannula, minimal distress  Intervention Category Intermediate Interventions: Respiratory distress - evaluation and management  Jb Dulworth 03/01/2024, 3:30 AM

## 2024-03-02 ENCOUNTER — Inpatient Hospital Stay (HOSPITAL_COMMUNITY)

## 2024-03-02 DIAGNOSIS — Z515 Encounter for palliative care: Secondary | ICD-10-CM | POA: Diagnosis not present

## 2024-03-02 DIAGNOSIS — A419 Sepsis, unspecified organism: Secondary | ICD-10-CM | POA: Diagnosis not present

## 2024-03-02 DIAGNOSIS — A4159 Other Gram-negative sepsis: Secondary | ICD-10-CM | POA: Diagnosis not present

## 2024-03-02 DIAGNOSIS — I38 Endocarditis, valve unspecified: Secondary | ICD-10-CM

## 2024-03-02 DIAGNOSIS — G20A1 Parkinson's disease without dyskinesia, without mention of fluctuations: Secondary | ICD-10-CM | POA: Diagnosis not present

## 2024-03-02 DIAGNOSIS — J690 Pneumonitis due to inhalation of food and vomit: Secondary | ICD-10-CM | POA: Diagnosis not present

## 2024-03-02 DIAGNOSIS — B964 Proteus (mirabilis) (morganii) as the cause of diseases classified elsewhere: Secondary | ICD-10-CM | POA: Diagnosis not present

## 2024-03-02 DIAGNOSIS — J9601 Acute respiratory failure with hypoxia: Secondary | ICD-10-CM | POA: Diagnosis not present

## 2024-03-02 DIAGNOSIS — Z7189 Other specified counseling: Secondary | ICD-10-CM | POA: Diagnosis not present

## 2024-03-02 LAB — CULTURE, BLOOD (ROUTINE X 2)
Culture: NO GROWTH
Culture: NO GROWTH
Special Requests: ADEQUATE
Special Requests: ADEQUATE

## 2024-03-02 LAB — CBC
HCT: 34.4 % — ABNORMAL LOW (ref 39.0–52.0)
Hemoglobin: 11.2 g/dL — ABNORMAL LOW (ref 13.0–17.0)
MCH: 28.1 pg (ref 26.0–34.0)
MCHC: 32.6 g/dL (ref 30.0–36.0)
MCV: 86.2 fL (ref 80.0–100.0)
Platelets: 179 10*3/uL (ref 150–400)
RBC: 3.99 MIL/uL — ABNORMAL LOW (ref 4.22–5.81)
RDW: 15.8 % — ABNORMAL HIGH (ref 11.5–15.5)
WBC: 10.5 10*3/uL (ref 4.0–10.5)
nRBC: 0 % (ref 0.0–0.2)

## 2024-03-02 LAB — ECHOCARDIOGRAM COMPLETE
AR max vel: 2.4 cm2
AV Area VTI: 2.26 cm2
AV Area mean vel: 2.21 cm2
AV Mean grad: 4.7 mmHg
AV Peak grad: 8.3 mmHg
Ao pk vel: 1.44 m/s
Area-P 1/2: 3.48 cm2
S' Lateral: 2.9 cm
Weight: 2627.88 [oz_av]

## 2024-03-02 LAB — BASIC METABOLIC PANEL WITH GFR
Anion gap: 12 (ref 5–15)
BUN: 67 mg/dL — ABNORMAL HIGH (ref 8–23)
CO2: 25 mmol/L (ref 22–32)
Calcium: 8 mg/dL — ABNORMAL LOW (ref 8.9–10.3)
Chloride: 109 mmol/L (ref 98–111)
Creatinine, Ser: 1.86 mg/dL — ABNORMAL HIGH (ref 0.61–1.24)
GFR, Estimated: 37 mL/min — ABNORMAL LOW (ref >60)
Glucose, Bld: 105 mg/dL — ABNORMAL HIGH (ref 70–99)
Potassium: 3.7 mmol/L (ref 3.5–5.1)
Sodium: 146 mmol/L — ABNORMAL HIGH (ref 135–145)

## 2024-03-02 LAB — GLUCOSE, CAPILLARY
Glucose-Capillary: 105 mg/dL — ABNORMAL HIGH (ref 70–99)
Glucose-Capillary: 106 mg/dL — ABNORMAL HIGH (ref 70–99)
Glucose-Capillary: 122 mg/dL — ABNORMAL HIGH (ref 70–99)
Glucose-Capillary: 141 mg/dL — ABNORMAL HIGH (ref 70–99)
Glucose-Capillary: 143 mg/dL — ABNORMAL HIGH (ref 70–99)
Glucose-Capillary: 94 mg/dL (ref 70–99)

## 2024-03-02 MED ORDER — HYDRALAZINE HCL 20 MG/ML IJ SOLN: 10.0000 mg | Freq: Four times a day (QID) | INTRAMUSCULAR | Status: DC | PRN

## 2024-03-02 MED ORDER — VANCOMYCIN HCL 750 MG/150ML IV SOLN
750.0000 mg | INTRAVENOUS | Status: DC
  Administered 2024-03-03 – 2024-03-04 (×2): 750 mg via INTRAVENOUS
  Filled 2024-03-02 (×2): qty 150

## 2024-03-02 MED ORDER — VANCOMYCIN HCL 1500 MG/300ML IV SOLN
1500.0000 mg | Freq: Once | INTRAVENOUS | Status: AC
  Administered 2024-03-02: 1500 mg via INTRAVENOUS
  Filled 2024-03-02: qty 300

## 2024-03-02 NOTE — Progress Notes (Signed)
*  PRELIMINARY RESULTS* Echocardiogram 2D Echocardiogram has been performed.  Benard FORBES Stallion 03/02/2024, 3:44 PM

## 2024-03-02 NOTE — Progress Notes (Signed)
 Palliative:  HPI:  78 y.o. male  with past medical history of Parkinson's disease, dementia, peripheral neuropathy, orthostatic hypotension, HLD admitted on 02/25/2024 with AMS, fatigue, weakness found to have sepsis due to proteus bacteremia from R pyelonephritis with obstructing stone. Acute respiratory decline now requiring 35L/70% with concern for aspiration pneumonia in the setting of Parkinson's disease.   I met today with wife Alex Frazier and daughter Alex Frazier as Alex Frazier was bathed. We reviewed that his progress is much unchanged. We spent time reviewing Alex Frazier's status - mostly unchanged from yesterday. They shared with me Alex Frazier's thoughts and fears when diagnosed with Parkinson's disease - his father had Parkinson's and they believe that he suffered for many years with PEG tube prolonging his life. They know that Alex Frazier would not want this. We were able to explore the reality that his prognosis is very poor and even best case scenario it does not seem he would have the quality of life he would desire.   Family share that another daughter is planning to come this afternoon. Their other daughter is planned to drive here from [facility] Friday - this was already planned trip. Alex Frazier is awaiting to tell daughters about DNR status and poor prognosis until they can be here in person. They are worried about how they will handle and accept this information. I offered to come by for support and to answer questions when other daughter arrives this afternoon - they agree with this.   We also spent time reviewing realistic next steps. We discussed the unlikelihood that he will improve to acceptable quality of life. We discussed measures that are prolonging life (specifically high oxygen requirements and antibiotics). We reviewed that we may need to consider if even these measures are beneficial to [facility] if we do not expect acceptable improvements or if we are only prolonging his suffering. We discussed that it may be appropriate  to discontinue and focus on comfort once other 2 daughters are brought up to speed on situation and poor prognosis. Family is tearful but understanding.   Update: I returned to bedside and met at Alex Frazier's bedside with Alex Frazier and other daughter along with RN and Alex Furth, PA PCCM. Alex Frazier shared with daughter that Alex Frazier is more ill than she has explained previously. She shared decision for DNR/DNI status. Daughter is understanding and accepting - she is able to see how critically ill her father is currently. We reviewed potential of comfort care transition in the near future. They are discussing as a family to ensure other daughter gets here as soon as possible.   All questions/concerns addressed. Emotional support provided.   Exam: Unresponsive. Occasional moan - non-purposeful. Requiring NTS. Poor airway management. Breathing not labored on 35L/70%. Abd soft. Hands cool to touch.   Plan: - DNR/DNI - Awaiting family to come from [facility] - Anticipate transition to comfort in the coming days  85 min  Alex Kitty, NP Palliative Medicine Team Pager [phone] (Please see [email or web address] for schedule) Team Phone [phone]

## 2024-03-02 NOTE — Plan of Care (Signed)
  Problem: Fluid Volume: Goal: Ability to maintain a balanced intake and output will improve Outcome: Progressing   Problem: Metabolic: Goal: Ability to maintain appropriate glucose levels will improve Outcome: Progressing   Problem: Nutritional: Goal: Maintenance of adequate nutrition will improve Outcome: Progressing Goal: Progress toward achieving an optimal weight will improve Outcome: Progressing   Problem: Tissue Perfusion: Goal: Adequacy of tissue perfusion will improve Outcome: Progressing   Problem: Nutrition: Goal: Adequate nutrition will be maintained Outcome: Progressing   Problem: Coping: Goal: Level of anxiety will decrease Outcome: Progressing   Problem: Elimination: Goal: Will not experience complications related to bowel motility Outcome: Progressing Goal: Will not experience complications related to urinary retention Outcome: Progressing   Problem: Pain Managment: Goal: General experience of comfort will improve and/or be controlled Outcome: Progressing   Problem: Safety: Goal: Ability to remain free from injury will improve Outcome: Progressing   Problem: Education: Goal: Ability to describe self-care measures that may prevent or decrease complications (Diabetes Survival Skills Education) will improve Outcome: Not Progressing Goal: Individualized Educational Video(s) Outcome: Not Progressing   Problem: Coping: Goal: Ability to adjust to condition or change in health will improve Outcome: Not Progressing   Problem: Health Behavior/Discharge Planning: Goal: Ability to identify and utilize available resources and services will improve Outcome: Not Progressing Goal: Ability to manage health-related needs will improve Outcome: Not Progressing   Problem: Skin Integrity: Goal: Risk for impaired skin integrity will decrease Outcome: Not Progressing   Problem: Education: Goal: Knowledge of General Education information will improve Description:  Including pain rating scale, medication(s)/side effects and non-pharmacologic comfort measures Outcome: Not Progressing   Problem: Health Behavior/Discharge Planning: Goal: Ability to manage health-related needs will improve Outcome: Not Progressing   Problem: Clinical Measurements: Goal: Ability to maintain clinical measurements within normal limits will improve Outcome: Not Progressing Goal: Will remain free from infection Outcome: Not Progressing Goal: Diagnostic test results will improve Outcome: Not Progressing Goal: Respiratory complications will improve Outcome: Not Progressing Goal: Cardiovascular complication will be avoided Outcome: Not Progressing   Problem: Activity: Goal: Risk for activity intolerance will decrease Outcome: Not Progressing   Problem: Skin Integrity: Goal: Risk for impaired skin integrity will decrease Outcome: Not Progressing

## 2024-03-02 NOTE — Progress Notes (Signed)
 NAME:  Alex Frazier, MRN:  [id number], DOB:  08-11-1946, LOS: 6 ADMISSION DATE:  02/25/2024, CONSULTATION DATE:  02/25/2024 REFERRING MD:  Randol - EDP, CHIEF COMPLAINT: Acute encephalopathy  History of Present Illness:  Patient is a 78 year old male with PMHx significant for Parkinson's disease, dementia, peripheral neuropathy, orthostatic hypotension, HLD presents to [facility] on 6/19 with AMS.  On 6/19 patient having complaints of fatigue and weakness.  Per patient's wife patient is altered but more altered than normal.  LKN 6/18 10 p.m.  EMS called finding patient had tremors and hypertensive SBP > 200.  Given clonidine .  Patient also noted to be febrile 104F.  Transferred to [facility] ED.  On arrival neuro exam showing left upward gaze deviation patient altered not following commands but moving extremities spontaneously.  Patient also with diffuse shaking. CT Head with no acute abnormality.  Concerned for possible seizure, given Ativan . Loaded with Keppra . Neuro consulted. Glucose 133.  VBG 7.44, 30, 31, 20.  BP stable and tachy 110s.  Tmax 105.5F and WBC 5.8.  Cultures obtained and placed on broad-spectrum antibiotics.  Given IV fluids.  LA 7.9 then 6.7.  UA with large leukocytes.  COVID/flu/RSV negative.  CXR unremarkable. Given AMS, elevated LA, and need for possible EEG, PCCM consulted for ICU admission.  Pertinent Medical History:   Past Medical History:  Diagnosis Date   B12 deficiency    Borderline   Bulging discs    Degenerative disc disease, cervical    Dementia associated with Parkinson disease (HCC)    Gastritis    Hyperlipidemia    Neuropathy    Parkinson's disease (HCC)    Parkinsonian syndrome (HCC)    Peripheral neuropathy    TIA (transient ischemic attack)    TIA symptoms from hypotension   Significant Hospital Events: Including procedures, antibiotic start and stop dates in addition to other pertinent events   6/19 Admitted with AMS and sepsis from UTI.  6/20 Still  obtunded. Has proteus and staph species in blood. MRI ordered. Cont CSF antimicrobial coverage. Feeding tube placed. On LTM. Renal fxn a little worse getting urine studies and renal US . Had perc nephrostomy tube placed by IR. 6/24: significant increase in O2 requirement overnight now on 35L HHFNC from 4L yesterday. Had bedside GOC discussion with wife. Made DNR/DNI. Continuing all other care for now.  6/25: needs echo/repeat cultures for s.hominis x2 bottles. Unclear source. Adding vanc. Oxygen requirements still high.   Interim History / Subjective:  NAEON. Ongoing high oxygen requirement currently on 35L and 65%. Blood culture 2/4 with staph hominis. Not really sure where source is for this. Repeating blood cultures, obtain echo and place on vancomycin  for now. Will add NTS and try to aggressively treat his pneumonia/bacteremia. Updated family and they know prognosis guarded.   Objective:   Blood pressure (!) 132/55, pulse 75, temperature 98.4 F (36.9 C), temperature source Axillary, resp. rate (!) 28, weight 74.5 kg, SpO2 (!) 88%.    FiO2 (%):  [65 %-90 %] 70 %   Intake/Output Summary (Last 24 hours) at 03/02/2024 1038 Last data filed at 03/02/2024 0800 Gross per 24 hour  Intake 1330 ml  Output 1800 ml  Net -470 ml   Filed Weights   02/26/24 0000 02/26/24 0500 03/02/24 0609  Weight: 60.7 kg 60.7 kg 74.5 kg   Physical Examination: General: acute on chronically ill-appearing elderly man, laying in bed HEENT: /AT, anicteric sclera, PERRL 3mm, dry mucous membranes. Neuro: somnolent. Occasionally groaning spontaneously. No response  to painful noxious stimulus. Not following commands. No spontaneous movement of extremities noted. +Corneal, +Cough, and +Gag (weak) CV: RRR, no m/g/r. PULM: resp even and unlabored, rhonchi bilaterally, upper airway congestion, HHFNC GI: Soft, nontender, nondistended. Normoactive bowel sounds. Extremities: Bilateral 1-2+ symmetric pitting LE edema  noted.  Resolved problem List:  Shock resolved Assessment and Plan:   Sepsis due to proteus bacteremia from Right pyelonephritis due to obstructing stone Lactic Acidosis S/p percutaneous nephrostomy drain by IR on 6/20. Shock resolved, off pressors. - 6/19 blood cultures speciated proteus (expected) and now staph hominis. Repeat cultures 6/20 NGTD.  - repeat blood cultures 6/25 - start vancomycin  for s. Hominis  - echo ordered to eval for vegetation - continue rocephin   - continue florinef  and midodrine , home medications - map goal > 65  - trend wbc, fever curve   Acute hypoxic respiratory failure  Aspiration pneumonia  Significant increase in oxygen requirement 6/23-6/24 overnight now on 35L/70% HHFNC. CXR 6/23 with bilateral infiltrates consistent with pneumonia. Assume there is a parkinson component to his aspiration  - continue rocephin  as above  - adding CPT and HTS nebs to help with secretions  - NTS PRN - aggressive pulmonary hygiene  - aspiration precautions  - DNI   Acute metabolic encephalopathy +/- seizure  in setting of sepsis complicated by dementia, Parkinson's disease & Mood disorder CT Head NAICA. cEEG negative for seizures. - Continue Sinemet  - Continue Prozac  - Low level of concern for CNS infection, meningitis coverage discontinued - Seizure precautions - Avoid sedating medications as able  Pyelonephritis due to obstructing stone s/p perc nephrostomy tube - Trend BMP - Replete electrolytes as indicated - Monitor I&Os - Avoid nephrotoxic agents as able - Ensure adequate renal perfusion  Hypernatremia  - adding free water 200 q8h  - trend bmp  Hyperglycemia - SSI - CBGs Q4H - Goal CBG 140-180  HLD - statin  Anemia Thrombocytopenia - Trend H&H, Plt - Monitor for signs of active bleeding - Transfuse for Hgb < 7.0, Plt < 20K or hemodynamically significant bleeding  Sacral and b/l heel wound POA WOC evaluated 6/20. - Wound care per WOC  recommendations - Pressure-relieving devices  Mild papular rash - Supportive care - Monitor closely - Med review, does not appear to be drug eruption  Best Practice: (right click and Reselect all SmartList Selections daily)   Diet/type: tubefeeds and NPO DVT prophylaxis prophylactic heparin   Pressure ulcer(s): present on admission  GI prophylaxis: N/A Lines: N/A Foley:  N/A Code Status:  limited Last date of multidisciplinary goals of care discussion [6/24 GOC discussion with wife on rounds, made DNR/DNI]  Signature:   CC time: 35 minutes   Tinnie FORBES Furth, PA-C [facility] 03/02/24 10:38 AM  Please see [email or web address] for pager details.  From 7A-7P if no response, please call [phone] After hours, please call ELink [phone]

## 2024-03-02 NOTE — Progress Notes (Signed)
 Pharmacy Antibiotic Note  Alex Frazier is a 78 y.o. male admitted on 02/25/2024 with bacteremia.  Pharmacy has been consulted for vancomycin  dosing.  Plan: Vancomycin  1500mg  IV x1 followed by 750mg  IV q 24h F/u cultures, sensitivity Goal trough 15-72mcg/mL, AUC 400-600 F/u renal func, LOT  Weight: 74.5 kg (164 lb 3.9 oz)  Temp (24hrs), Avg:99.1 F (37.3 C), Min:97.6 F (36.4 C), Max:100.6 F (38.1 C)  Recent Labs  Lab 02/25/24 1549 02/25/24 1804 02/25/24 1810 02/26/24 0522 02/26/24 1429 02/26/24 1614 02/27/24 0841 02/28/24 1316 02/29/24 0956 03/01/24 0558  WBC  --   --  5.8 20.5*  --   --   --  20.2*  --  14.6*  CREATININE  --   --   --  2.36*  --   --  2.60* 2.63* 2.42* 2.04*  LATICACIDVEN 7.9* 6.7*  --  3.0* 3.2* 3.5*  --   --   --   --   VANCORANDOM  --   --   --   --   --   --  12  --   --   --     Estimated Creatinine Clearance: 27.4 mL/min (A) (by C-G formula based on SCr of 2.04 mg/dL (H)).    Allergies  Allergen Reactions   Tape Other (See Comments)    SKIN IS VERY THIN- TEARS VERY EASILY!!   Hydrocodone -Acetaminophen  Other (See Comments)    Hallucinations and confusion   Indomethacin Other (See Comments)    Unknown reaction   Lyrica  [Pregabalin ] Other (See Comments)    Vivid hallucinations   Nsaids Other (See Comments)    Per family patient gets hallucination/confusion.   Quetiapine  Other (See Comments)    HYPOtension!!    Antimicrobials this admission: Acyclovir  6/20 >> 6/21 Amp 6/20 >> 6/21 Ceftriaxone  6/20 > Vanc 6/19 >>6/20; 6/25>   Dose adjustments this admission: 6/20 Vanc 1500mg  (2 divided doses, 1000mg  + 500mg )    Cultures:  6/19 BCx - MRSE, staph hominis, Proteus  6/19 UCx - proteus  6/19 MRSA PCR - negative Urine legionella neg  6/20 BCX- NG x4 6/25 BCX-   Thank you for allowing pharmacy to be a part of this patient's care.  Sharyne Glatter, PharmD, BCCCP Clinical Pharmacist 03/02/2024 8:29 AM

## 2024-03-03 ENCOUNTER — Inpatient Hospital Stay (HOSPITAL_COMMUNITY)

## 2024-03-03 DIAGNOSIS — Z515 Encounter for palliative care: Secondary | ICD-10-CM | POA: Diagnosis not present

## 2024-03-03 DIAGNOSIS — Z7189 Other specified counseling: Secondary | ICD-10-CM | POA: Diagnosis not present

## 2024-03-03 DIAGNOSIS — J690 Pneumonitis due to inhalation of food and vomit: Secondary | ICD-10-CM | POA: Diagnosis not present

## 2024-03-03 DIAGNOSIS — J9601 Acute respiratory failure with hypoxia: Secondary | ICD-10-CM | POA: Diagnosis not present

## 2024-03-03 DIAGNOSIS — G20A1 Parkinson's disease without dyskinesia, without mention of fluctuations: Secondary | ICD-10-CM | POA: Diagnosis not present

## 2024-03-03 DIAGNOSIS — B964 Proteus (mirabilis) (morganii) as the cause of diseases classified elsewhere: Secondary | ICD-10-CM | POA: Diagnosis not present

## 2024-03-03 DIAGNOSIS — A419 Sepsis, unspecified organism: Secondary | ICD-10-CM | POA: Diagnosis not present

## 2024-03-03 DIAGNOSIS — A4159 Other Gram-negative sepsis: Secondary | ICD-10-CM | POA: Diagnosis not present

## 2024-03-03 LAB — CBC WITH DIFFERENTIAL/PLATELET
Abs Immature Granulocytes: 0 10*3/uL (ref 0.00–0.07)
Basophils Absolute: 0 10*3/uL (ref 0.0–0.1)
Basophils Relative: 0 %
Eosinophils Absolute: 0 10*3/uL (ref 0.0–0.5)
Eosinophils Relative: 0 %
HCT: 40.8 % (ref 39.0–52.0)
Hemoglobin: 12.9 g/dL — ABNORMAL LOW (ref 13.0–17.0)
Lymphocytes Relative: 4 %
Lymphs Abs: 0.6 10*3/uL — ABNORMAL LOW (ref 0.7–4.0)
MCH: 28 pg (ref 26.0–34.0)
MCHC: 31.6 g/dL (ref 30.0–36.0)
MCV: 88.5 fL (ref 80.0–100.0)
Monocytes Absolute: 0.3 10*3/uL (ref 0.1–1.0)
Monocytes Relative: 2 %
Neutro Abs: 13 10*3/uL — ABNORMAL HIGH (ref 1.7–7.7)
Neutrophils Relative %: 94 %
Platelets: 176 10*3/uL (ref 150–400)
RBC: 4.61 MIL/uL (ref 4.22–5.81)
RDW: 16.1 % — ABNORMAL HIGH (ref 11.5–15.5)
WBC: 13.8 10*3/uL — ABNORMAL HIGH (ref 4.0–10.5)
nRBC: 0 % (ref 0.0–0.2)
nRBC: 0 /100{WBCs}

## 2024-03-03 LAB — GLUCOSE, CAPILLARY
Glucose-Capillary: 103 mg/dL — ABNORMAL HIGH (ref 70–99)
Glucose-Capillary: 116 mg/dL — ABNORMAL HIGH (ref 70–99)
Glucose-Capillary: 143 mg/dL — ABNORMAL HIGH (ref 70–99)
Glucose-Capillary: 144 mg/dL — ABNORMAL HIGH (ref 70–99)
Glucose-Capillary: 146 mg/dL — ABNORMAL HIGH (ref 70–99)
Glucose-Capillary: 175 mg/dL — ABNORMAL HIGH (ref 70–99)

## 2024-03-03 LAB — BASIC METABOLIC PANEL WITH GFR
Anion gap: 14 (ref 5–15)
BUN: 64 mg/dL — ABNORMAL HIGH (ref 8–23)
CO2: 25 mmol/L (ref 22–32)
Calcium: 8.2 mg/dL — ABNORMAL LOW (ref 8.9–10.3)
Chloride: 110 mmol/L (ref 98–111)
Creatinine, Ser: 1.7 mg/dL — ABNORMAL HIGH (ref 0.61–1.24)
GFR, Estimated: 41 mL/min — ABNORMAL LOW (ref >60)
Glucose, Bld: 141 mg/dL — ABNORMAL HIGH (ref 70–99)
Potassium: 3.5 mmol/L (ref 3.5–5.1)
Sodium: 149 mmol/L — ABNORMAL HIGH (ref 135–145)

## 2024-03-03 LAB — CULTURE, BLOOD (ROUTINE X 2): Culture: NO GROWTH

## 2024-03-03 MED ORDER — LACTATED RINGERS IV BOLUS
500.0000 mL | Freq: Once | INTRAVENOUS | Status: AC
  Administered 2024-03-03: 500 mL via INTRAVENOUS

## 2024-03-03 MED ORDER — POTASSIUM CHLORIDE 20 MEQ PO PACK
20.0000 meq | PACK | Freq: Once | ORAL | Status: AC
  Administered 2024-03-03: 20 meq
  Filled 2024-03-03: qty 1

## 2024-03-03 NOTE — Progress Notes (Signed)
  Progress Note   Date: 03/02/2024  Patient Name: Alex Frazier        MRN#: [id number]  Clarification of diagnosis:  Severe malnutrition  present

## 2024-03-03 NOTE — Progress Notes (Signed)
 NAME:  Alex Frazier, MRN:  [id number], DOB:  07-29-1946, LOS: 7 ADMISSION DATE:  02/25/2024, CONSULTATION DATE:  02/25/2024 REFERRING MD:  Randol - EDP, CHIEF COMPLAINT: Acute encephalopathy  History of Present Illness:  Patient is a 78 year old male with PMHx significant for Parkinson's disease, dementia, peripheral neuropathy, orthostatic hypotension, HLD presents to [facility] on 6/19 with AMS.  On 6/19 patient having complaints of fatigue and weakness.  Per patient's wife patient is altered but more altered than normal.  LKN 6/18 10 p.m.  EMS called finding patient had tremors and hypertensive SBP > 200.  Given clonidine .  Patient also noted to be febrile 104F.  Transferred to [facility] ED.  On arrival neuro exam showing left upward gaze deviation patient altered not following commands but moving extremities spontaneously.  Patient also with diffuse shaking. CT Head with no acute abnormality.  Concerned for possible seizure, given Ativan . Loaded with Keppra . Neuro consulted. Glucose 133.  VBG 7.44, 30, 31, 20.  BP stable and tachy 110s.  Tmax 105.52F and WBC 5.8.  Cultures obtained and placed on broad-spectrum antibiotics.  Given IV fluids.  LA 7.9 then 6.7.  UA with large leukocytes.  COVID/flu/RSV negative.  CXR unremarkable. Given AMS, elevated LA, and need for possible EEG, PCCM consulted for ICU admission.  Pertinent Medical History:   Past Medical History:  Diagnosis Date   B12 deficiency    Borderline   Bulging discs    Degenerative disc disease, cervical    Dementia associated with Parkinson disease (HCC)    Gastritis    Hyperlipidemia    Neuropathy    Parkinson's disease (HCC)    Parkinsonian syndrome (HCC)    Peripheral neuropathy    TIA (transient ischemic attack)    TIA symptoms from hypotension   Significant Hospital Events: Including procedures, antibiotic start and stop dates in addition to other pertinent events   6/19 Admitted with AMS and sepsis from UTI.  6/20 Still  obtunded. Has proteus and staph species in blood. MRI ordered. Cont CSF antimicrobial coverage. Feeding tube placed. On LTM. Renal fxn a little worse getting urine studies and renal US . Had perc nephrostomy tube placed by IR. 6/24: significant increase in O2 requirement overnight now on 35L HHFNC from 4L yesterday. Had bedside GOC discussion with wife. Made DNR/DNI. Continuing all other care for now.  6/25: needs echo/repeat cultures for s.hominis x2 bottles. Unclear source. Adding vanc. Oxygen requirements still high.  6/26: echo negative. Renal us  for nephrostomy output drop off. Ongoing GOC. Anticipate he will need move to comfort/hospice if he does not significantly improve in the next 1-2 days.   Interim History / Subjective:  NAEON. Continues to have high oxygen requirement. On vanc/rocephin . Pending repeat culture data. Trying aggressive pulmonary hygiene to improve oxygen requirements. Renal Ultrasound this morning for nephrostomy output drop off. Updated 2nd daughter yesterday. Wife states 3rd daughter is coming from [place]  on Friday 6/27 to visit and be updated on GOC. Anticipate if he in not improving from oxygen standpoint in next 24-48 hours, will need to have more comfort/hospice discussion.   Objective:   Blood pressure (!) 166/72, pulse 63, temperature 99.3 F (37.4 C), temperature source Axillary, resp. rate (!) 28, weight 70.9 kg, SpO2 92%.    FiO2 (%):  [50 %-90 %] 50 %   Intake/Output Summary (Last 24 hours) at 03/03/2024 0914 Last data filed at 03/03/2024 0700 Gross per 24 hour  Intake 2290 ml  Output 1350 ml  Net 940 ml  Filed Weights   02/26/24 0500 03/02/24 0609 03/03/24 0352  Weight: 60.7 kg 74.5 kg 70.9 kg   Physical Examination: General: acute on chronically ill-appearing elderly man, laying in bed HEENT: [place]/AT, anicteric sclera, PERRL 3mm, dry mucous membranes. Neuro: somnolent. Occasionally groaning spontaneously. No response to painful noxious stimulus. Not  following commands. No spontaneous movement of extremities noted. +Cough and +Gag (weak) CV: RRR, no m/g/r. PULM: resp even and unlabored, rhonchi bilaterally, upper airway congestion, HHFNC GI: Soft, nontender, nondistended. Normoactive bowel sounds. Extremities: Bilateral 1-2+ symmetric pitting LE edema noted. 1+ edema upper extremities  Resolved problem List:  Shock resolved Assessment and Plan:   Sepsis due to proteus bacteremia from Right pyelonephritis due to obstructing stone Staph hominis bacteremia Lactic Acidosis S/p percutaneous nephrostomy drain by IR on 6/20. Shock resolved, off pressors. - 6/19 blood cultures speciated proteus (expected) and now staph hominis. Repeat cultures 6/20 NGTD. 6/25 echo with no obvious evidence of vegetation. Blood cultures repeated.  - f/u repeat blood cultures  - con't vancomycin  for s. Hominis  - echo ordered to eval for vegetation - continue rocephin  10 day course to end 7/1 - continue florinef  and midodrine , home medications - map goal > 65  - trend wbc, fever curve   Acute hypoxic respiratory failure  Aspiration pneumonia  Significant increase in oxygen requirement 6/23-6/24 overnight now on 35L/70% HHFNC. CXR 6/23 with bilateral infiltrates consistent with pneumonia. Assume there is a parkinson component to his aspiration  - continue rocephin  as above  - NTS PRN, metaneb, HTS neb - aggressive pulmonary hygiene  - aspiration precautions  - DNI   Acute metabolic encephalopathy +/- seizure  in setting of sepsis complicated by dementia, Parkinson's disease & Mood disorder CT Head NAICA. cEEG negative for seizures. - Continue Sinemet  - Continue Prozac  - Low level of concern for CNS infection, meningitis coverage discontinued - Seizure precautions - Avoid sedating medications as able  Pyelonephritis due to obstructing stone s/p perc nephrostomy tube - nephrostomy output dropped off 6/26 AM - obtain Renal US  now  - Trend BMP - Replete  electrolytes as indicated - Monitor I&Os - Avoid nephrotoxic agents as able - Ensure adequate renal perfusion  Hypernatremia  - free water 200 q8h  - trend bmp  Hyperglycemia - SSI - CBGs Q4H - Goal CBG 140-180  HLD - statin  Anemia Thrombocytopenia - Trend H&H, Plt - Monitor for signs of active bleeding - Transfuse for Hgb < 7.0, Plt < 20K or hemodynamically significant bleeding  Sacral and b/l heel wound POA WOC evaluated 6/20. - Wound care per WOC recommendations - Pressure-relieving devices  Mild papular rash - Supportive care - Monitor closely - Med review, does not appear to be drug eruption  GOC: made DNR/DNI on 6/24 - see IPAL. Unfortunately, patient has very limited QOL prior to admission. From wife, he is mostly bedbound at home. She does all of his care. States that PTA he did feed himself sometimes, but is minimally interactive. He has had poor recovery here and is still on significant oxygen requirement despite aggressive pulmonary hygiene, antibiotic treatment. I anticipate he will move toward a comfort/hospice course if things are not significantly improving in the next 2 or so days. Palliative is also on board.   Best Practice: (right click and Reselect all SmartList Selections daily)   Diet/type: tubefeeds and NPO DVT prophylaxis prophylactic heparin   Pressure ulcer(s): present on admission  GI prophylaxis: N/A Lines: N/A Foley:  N/A Code Status:  limited  Last date of multidisciplinary goals of care discussion [6/24 GOC discussion with wife on rounds, made DNR/DNI]  Signature:   CC time: 35 minutes   Tinnie FORBES Furth, PA-C [facility] 03/03/24 9:14 AM  Please see [email or web address] for pager details.  From 7A-7P if no response, please call [phone] After hours, please call ELink [phone]

## 2024-03-03 NOTE — Progress Notes (Signed)
   Progress Note   Date: 03/02/2024  Patient Name: Alex Frazier        MRN#: [id number]   Clarification of the diagnosis of pressure ulcer(s):   Pressure injury of heels and sacrum, present on admission (at the time of the admission order)

## 2024-03-03 NOTE — Progress Notes (Signed)
 Palliative:  HPI:  78 y.o. male  with past medical history of Parkinson's disease, dementia, peripheral neuropathy, orthostatic hypotension, HLD admitted on 02/25/2024 with AMS, fatigue, weakness found to have sepsis due to proteus bacteremia from R pyelonephritis with obstructing stone. Acute respiratory decline now requiring 35L/70% with concern for aspiration pneumonia in the setting of Parkinson's disease.   I met today at Alex Frazier's bedside along with wife Alex Frazier and her niece. Her niece is present for ongoing emotional support. Daughter, Amy, has gone to dentist appointment and will be back later (Amy called while I was present and had no specific questions/concerns for me today). Alex Frazier shares that there have been no real changes or improvements. We are awaiting their daughter from [facility] to be here tomorrow (likely later tomorrow evening). They have notified daughter from [facility] to poor prognosis and decision for DNR status. We reviewed likely plan for transition to focus on his comfort and not prolong his suffering based on what we feel his wishes would be. They know he would not want to live like this. Alex Frazier shares that she hates seeing him this way. I explained that I will be off a couple days but I will get one of my colleagues to follow and help support them in next steps.   All questions/concerns addressed. Emotional support provided.   Exam: Unresponsive. Occasional moan - non-purposeful and some signs of discomfort occasionally. No purposeful responses. Requiring NTS. Poor airway management. Breathing not labored on 40L/65%. Abd soft. Hands cool to touch.    Plan: - DNR/DNI - Awaiting family to come from [facility] - Anticipate transition to comfort in the coming days   35 min  Bernarda Kitty, NP Palliative Medicine Team Pager [phone] (Please see [email or web address] for schedule) Team Phone [phone]

## 2024-03-03 NOTE — Plan of Care (Signed)
  Problem: Metabolic: Goal: Ability to maintain appropriate glucose levels will improve Outcome: Progressing   Problem: Nutritional: Goal: Maintenance of adequate nutrition will improve Outcome: Progressing Goal: Progress toward achieving an optimal weight will improve Outcome: Progressing   Problem: Tissue Perfusion: Goal: Adequacy of tissue perfusion will improve Outcome: Progressing   Problem: Clinical Measurements: Goal: Cardiovascular complication will be avoided Outcome: Progressing   Problem: Nutrition: Goal: Adequate nutrition will be maintained Outcome: Progressing   Problem: Coping: Goal: Level of anxiety will decrease Outcome: Progressing   Problem: Elimination: Goal: Will not experience complications related to bowel motility Outcome: Progressing   Problem: Pain Managment: Goal: General experience of comfort will improve and/or be controlled Outcome: Progressing   Problem: Safety: Goal: Ability to remain free from injury will improve Outcome: Progressing   Problem: Education: Goal: Ability to describe self-care measures that may prevent or decrease complications (Diabetes Survival Skills Education) will improve Outcome: Not Progressing Goal: Individualized Educational Video(s) Outcome: Not Progressing   Problem: Coping: Goal: Ability to adjust to condition or change in health will improve Outcome: Not Progressing   Problem: Fluid Volume: Goal: Ability to maintain a balanced intake and output will improve Outcome: Not Progressing   Problem: Health Behavior/Discharge Planning: Goal: Ability to identify and utilize available resources and services will improve Outcome: Not Progressing Goal: Ability to manage health-related needs will improve Outcome: Not Progressing   Problem: Skin Integrity: Goal: Risk for impaired skin integrity will decrease Outcome: Not Progressing   Problem: Education: Goal: Knowledge of General Education information will  improve Description: Including pain rating scale, medication(s)/side effects and non-pharmacologic comfort measures Outcome: Not Progressing   Problem: Health Behavior/Discharge Planning: Goal: Ability to manage health-related needs will improve Outcome: Not Progressing   Problem: Clinical Measurements: Goal: Ability to maintain clinical measurements within normal limits will improve Outcome: Not Progressing Goal: Will remain free from infection Outcome: Not Progressing Goal: Diagnostic test results will improve Outcome: Not Progressing Goal: Respiratory complications will improve Outcome: Not Progressing   Problem: Activity: Goal: Risk for activity intolerance will decrease Outcome: Not Progressing   Problem: Elimination: Goal: Will not experience complications related to urinary retention Outcome: Not Progressing   Problem: Skin Integrity: Goal: Risk for impaired skin integrity will decrease Outcome: Not Progressing

## 2024-03-04 ENCOUNTER — Inpatient Hospital Stay (HOSPITAL_COMMUNITY)

## 2024-03-04 DIAGNOSIS — G9341 Metabolic encephalopathy: Secondary | ICD-10-CM | POA: Diagnosis not present

## 2024-03-04 DIAGNOSIS — Z515 Encounter for palliative care: Secondary | ICD-10-CM | POA: Diagnosis not present

## 2024-03-04 DIAGNOSIS — B964 Proteus (mirabilis) (morganii) as the cause of diseases classified elsewhere: Secondary | ICD-10-CM | POA: Diagnosis not present

## 2024-03-04 DIAGNOSIS — J9601 Acute respiratory failure with hypoxia: Secondary | ICD-10-CM | POA: Diagnosis not present

## 2024-03-04 DIAGNOSIS — A4159 Other Gram-negative sepsis: Secondary | ICD-10-CM | POA: Diagnosis not present

## 2024-03-04 DIAGNOSIS — Z7189 Other specified counseling: Secondary | ICD-10-CM | POA: Diagnosis not present

## 2024-03-04 DIAGNOSIS — R569 Unspecified convulsions: Secondary | ICD-10-CM | POA: Diagnosis not present

## 2024-03-04 LAB — CULTURE, BLOOD (ROUTINE X 2)

## 2024-03-04 LAB — GLUCOSE, CAPILLARY
Glucose-Capillary: 142 mg/dL — ABNORMAL HIGH (ref 70–99)
Glucose-Capillary: 134 mg/dL — ABNORMAL HIGH (ref 70–99)
Glucose-Capillary: 138 mg/dL — ABNORMAL HIGH (ref 70–99)
Glucose-Capillary: 126 mg/dL — ABNORMAL HIGH (ref 70–99)

## 2024-03-04 LAB — BASIC METABOLIC PANEL WITH GFR
Anion gap: 12 (ref 5–15)
BUN: 58 mg/dL — ABNORMAL HIGH (ref 8–23)
CO2: 27 mmol/L (ref 22–32)
Calcium: 8.1 mg/dL — ABNORMAL LOW (ref 8.9–10.3)
Chloride: 112 mmol/L — ABNORMAL HIGH (ref 98–111)
Creatinine, Ser: 1.47 mg/dL — ABNORMAL HIGH (ref 0.61–1.24)
GFR, Estimated: 49 mL/min — ABNORMAL LOW (ref >60)
Glucose, Bld: 129 mg/dL — ABNORMAL HIGH (ref 70–99)
Potassium: 3.2 mmol/L — ABNORMAL LOW (ref 3.5–5.1)
Sodium: 151 mmol/L — ABNORMAL HIGH (ref 135–145)

## 2024-03-04 LAB — CBC WITH DIFFERENTIAL/PLATELET
Abs Immature Granulocytes: 0.17 10*3/uL — ABNORMAL HIGH (ref 0.00–0.07)
Basophils Absolute: 0 10*3/uL (ref 0.0–0.1)
Basophils Relative: 0 %
Eosinophils Absolute: 0.2 10*3/uL (ref 0.0–0.5)
Eosinophils Relative: 1 %
HCT: 31.2 % — ABNORMAL LOW (ref 39.0–52.0)
Hemoglobin: 10.2 g/dL — ABNORMAL LOW (ref 13.0–17.0)
Immature Granulocytes: 1 %
Lymphocytes Relative: 6 %
Lymphs Abs: 0.8 10*3/uL (ref 0.7–4.0)
MCH: 28.3 pg (ref 26.0–34.0)
MCHC: 32.7 g/dL (ref 30.0–36.0)
MCV: 86.4 fL (ref 80.0–100.0)
Monocytes Absolute: 0.4 10*3/uL (ref 0.1–1.0)
Monocytes Relative: 3 %
Neutro Abs: 10.7 10*3/uL — ABNORMAL HIGH (ref 1.7–7.7)
Neutrophils Relative %: 89 %
Platelets: 278 10*3/uL (ref 150–400)
RBC: 3.61 MIL/uL — ABNORMAL LOW (ref 4.22–5.81)
RDW: 16.1 % — ABNORMAL HIGH (ref 11.5–15.5)
WBC: 12.3 10*3/uL — ABNORMAL HIGH (ref 4.0–10.5)
nRBC: 0 % (ref 0.0–0.2)

## 2024-03-04 MED ORDER — HYDROMORPHONE BOLUS VIA INFUSION
1.0000 mg | INTRAVENOUS | Status: DC | PRN
  Administered 2024-03-04 (×2): 1 mg via INTRAVENOUS

## 2024-03-04 MED ORDER — HYDROMORPHONE HCL 1 MG/ML IJ SOLN
1.0000 mg | INTRAMUSCULAR | Status: DC | PRN
  Administered 2024-03-04: 1 mg via INTRAVENOUS
  Filled 2024-03-04: qty 1

## 2024-03-04 MED ORDER — FREE WATER
300.0000 mL | Freq: Three times a day (TID) | Status: DC
  Administered 2024-03-04: 300 mL

## 2024-03-04 MED ORDER — LORAZEPAM 2 MG/ML IJ SOLN
1.0000 mg | INTRAMUSCULAR | Status: DC | PRN
  Administered 2024-03-04 (×2): 2 mg via INTRAVENOUS
  Filled 2024-03-04 (×2): qty 1

## 2024-03-04 MED ORDER — POTASSIUM CHLORIDE 20 MEQ PO PACK
20.0000 meq | PACK | Freq: Once | ORAL | Status: AC
  Administered 2024-03-04: 20 meq
  Filled 2024-03-04: qty 1

## 2024-03-04 MED ORDER — GLYCOPYRROLATE 1 MG PO TABS: 1.0000 mg | ORAL_TABLET | ORAL | Status: DC | PRN

## 2024-03-04 MED ORDER — GLYCOPYRROLATE 0.2 MG/ML IJ SOLN: 0.2000 mg | INTRAMUSCULAR | Status: DC | PRN

## 2024-03-04 MED ORDER — MIDAZOLAM HCL 2 MG/2ML IJ SOLN
2.0000 mg | INTRAMUSCULAR | Status: DC | PRN
  Administered 2024-03-04: 2 mg via INTRAVENOUS
  Filled 2024-03-04: qty 2

## 2024-03-04 MED ORDER — HYDROMORPHONE HCL-NACL 50-0.9 MG/50ML-% IV SOLN
0.0000 mg/h | INTRAVENOUS | Status: DC
  Administered 2024-03-04: 1 mg/h via INTRAVENOUS
  Administered 2024-03-05: 3 mg/h via INTRAVENOUS
  Filled 2024-03-04 (×2): qty 50

## 2024-03-04 MED ORDER — GLYCOPYRROLATE 0.2 MG/ML IJ SOLN
0.2000 mg | INTRAMUSCULAR | Status: DC | PRN
Start: 2024-03-04 — End: 2024-03-05
  Administered 2024-03-04 – 2024-03-05 (×2): 0.2 mg via INTRAVENOUS
  Filled 2024-03-04 (×2): qty 1

## 2024-03-04 MED ORDER — LORAZEPAM 2 MG/ML IJ SOLN
1.0000 mg | INTRAMUSCULAR | Status: DC | PRN
  Administered 2024-03-04: 1 mg via INTRAVENOUS
  Filled 2024-03-04: qty 1

## 2024-03-04 MED ORDER — POLYVINYL ALCOHOL 1.4 % OP SOLN: 1.0000 [drp] | Freq: Four times a day (QID) | OPHTHALMIC | Status: DC | PRN

## 2024-03-04 MED ORDER — OXYCODONE HCL 5 MG PO TABS
5.0000 mg | ORAL_TABLET | ORAL | Status: DC | PRN
  Administered 2024-03-04 (×4): 5 mg
  Filled 2024-03-04 (×4): qty 1

## 2024-03-04 NOTE — Progress Notes (Signed)
 Daily Progress Note   Patient Name: Alex Frazier       Date: 03/04/2024 DOB: [birth year]  Age: 78 y.o. MRN#: [id number] Attending Physician: Neda Jennet LABOR, MD Primary Care Physician: Duanne Butler DASEN, MD Admit Date: 02/25/2024  Reason for Consultation/Follow-up: Establishing goals of care  Length of Stay: 8  Current Medications: Scheduled Meds:   aspirin   81 mg Per Tube Daily   carbidopa -levodopa   1.5 tablet Per NG tube TID   Chlorhexidine  Gluconate Cloth  6 each Topical Daily   feeding supplement (PROSource TF20)  60 mL Per Tube Daily   fludrocortisone   200 mcg Per Tube Daily   free water   300 mL Per Tube Q8H   heparin  injection (subcutaneous)  5,000 Units Subcutaneous Q8H   insulin  aspart  0-9 Units Subcutaneous Q4H   liver oil-zinc  oxide   Topical BID   multivitamin with minerals  1 tablet Per Tube Daily   pravastatin   20 mg Per Tube Daily   sodium chloride  flush  5 mL Intracatheter Q8H    Continuous Infusions:  cefTRIAXone  (ROCEPHIN )  IV Stopped (03/03/24 2132)   feeding supplement (OSMOLITE 1.5 CAL) 45 mL/hr at 03/04/24 1200    PRN Meds: docusate, hydrALAZINE , HYDROmorphone  (DILAUDID ) injection, LORazepam , mouth rinse, oxyCODONE , polyethylene glycol  Physical Exam Vitals reviewed.  Constitutional:      Appearance: He is ill-appearing.  HENT:     Head: Normocephalic and atraumatic.   Cardiovascular:     Rate and Rhythm: Normal rate.  Pulmonary:     Effort: Tachypnea present.     Comments: Labored breathing  Neurological:     Mental Status: He is unresponsive.             Vital Signs: BP (!) 126/55   Pulse 69   Temp 99.3 F (37.4 C) (Axillary)   Resp (!) 23   Wt 71.9 kg   SpO2 96%   BMI 25.58 kg/m  SpO2: SpO2: 96 % O2 Device: O2 Device: Heated  High Flow Nasal Cannula O2 Flow Rate: O2 Flow Rate (L/min): 40 L/min      Palliative Assessment/Data: 10%      Patient Active Problem List   Diagnosis Date Noted   Protein-calorie malnutrition, severe 02/26/2024   Seizure (HCC) 02/25/2024   Dementia associated with Parkinson disease (HCC)  Elevated BP without diagnosis of hypertension 05/15/2023   COVID-19 virus infection 05/14/2023   Sepsis secondary to UTI (HCC) 05/14/2023   Hypocalcemia 05/14/2023   Pulmonary nodule    Lumbar compression fracture (HCC) 10/26/2019   Autonomic dysfunction 08/19/2019   Arterial hypotension 08/19/2019   DDD (degenerative disc disease), cervical 04/07/2016   Parkinson's disease (HCC) 07/08/2011   Idiopathic polyneuropathy 07/08/2011   Bulging discs    Gastritis    Hyperlipidemia     Palliative Care Assessment & Plan   Patient Profile: 78 y.o. male with past medical history of Parkinson's disease, dementia, peripheral neuropathy, orthostatic hypotension, HLD admitted on 02/25/2024 with AMS, fatigue, weakness found to have sepsis due to proteus bacteremia from R pyelonephritis with obstructing stone. Acute respiratory decline now requiring 35L/70% with concern for aspiration pneumonia in the setting of Parkinson's disease.   Today's Discussion: Chart reviewed and updated by patient's nurse. Patient lying in bed with labored breathing. RN has given as needed medication for labored breathing. Patient's wife, middle daughter, and granddaughter are at bedside. Patient's wife Alex Frazier shared that it is very difficult to see the patient in his current state.  All questions answered. Emotional support and therapeutic listening provided. The family are waiting for the oldest daughter from [place]  to arrive to hear medical update and be able to ask questions. They will likely transition to comfort measures after she arrives.   Encouraged Alex Frazier to reach out to PMT with questions or concerns. PMT will  continue to follow.  Recommendations/Plan: DNR/DNI Awaiting family to come from [facility] Anticipate transition to comfort tonight or tomorrow PMT to follow    Code Status:    Code Status Orders  (From admission, onward)           Start     Ordered   03/01/24 0901  Do not attempt resuscitation (DNR)- Limited -Do Not Intubate (DNI)  (Code Status)  Continuous       Question Answer Comment  If pulseless and not breathing No CPR or chest compressions.   In Pre-Arrest Conditions (Patient Is Breathing and Has A Pulse) Do not intubate. Provide all appropriate non-invasive medical interventions. Avoid ICU transfer unless indicated or required.   Consent: Discussion documented in EHR or advanced directives reviewed      03/01/24 0900         Extensive chart review has been completed prior to seeing the patient including labs, vital signs, imaging, progress/consult notes, orders, medications, and available advance directive documents.  Care plan was discussed with bedside RN  Time spent: 35 minutes  Thank you for allowing the Palliative Medicine Team to assist in the care of this patient.    Stephane CHRISTELLA Palin, NP  Please contact Palliative Medicine Team phone at [phone] for questions and concerns.

## 2024-03-04 NOTE — Progress Notes (Signed)
 Patient transitioned to comfort care today. High flow nasal cannula removed and replaced with 2L regular nasal cannula, family requested the cortrak be removed, it was removed. Medication given to patient for comfort, and the dilaudid  gtt was hung once received from pharmacy, Currently increasing the dilaudid  gtt for comfort. Family at bedside at this time.

## 2024-03-04 NOTE — Plan of Care (Signed)
   Problem: Coping: Goal: Ability to adjust to condition or change in health will improve Outcome: Progressing   Problem: Fluid Volume: Goal: Ability to maintain a balanced intake and output will improve Outcome: Progressing

## 2024-03-04 NOTE — Progress Notes (Addendum)
 NAME:  Alex Frazier, MRN:  [id number], DOB:  March 07, 1946, LOS: 8 ADMISSION DATE:  02/25/2024, CONSULTATION DATE:  02/25/2024 REFERRING MD:  Randol - EDP, CHIEF COMPLAINT: Acute encephalopathy  History of Present Illness:  Patient is a 78 year old male with PMHx significant for Parkinson's disease, dementia, peripheral neuropathy, orthostatic hypotension, HLD presents to [facility] on 6/19 with AMS.  On 6/19 patient having complaints of fatigue and weakness.  Per patient's wife patient is altered but more altered than normal.  LKN 6/18 10 p.m.  EMS called finding patient had tremors and hypertensive SBP > 200.  Given clonidine .  Patient also noted to be febrile 104F.  Transferred to [facility] ED.  On arrival neuro exam showing left upward gaze deviation patient altered not following commands but moving extremities spontaneously.  Patient also with diffuse shaking. CT Head with no acute abnormality.  Concerned for possible seizure, given Ativan . Loaded with Keppra . Neuro consulted. Glucose 133.  VBG 7.44, 30, 31, 20.  BP stable and tachy 110s.  Tmax 105.66F and WBC 5.8.  Cultures obtained and placed on broad-spectrum antibiotics.  Given IV fluids.  LA 7.9 then 6.7.  UA with large leukocytes.  COVID/flu/RSV negative.  CXR unremarkable. Given AMS, elevated LA, and need for possible EEG, PCCM consulted for ICU admission.  Pertinent Medical History:   Past Medical History:  Diagnosis Date   B12 deficiency    Borderline   Bulging discs    Degenerative disc disease, cervical    Dementia associated with Parkinson disease (HCC)    Gastritis    Hyperlipidemia    Neuropathy    Parkinson's disease (HCC)    Parkinsonian syndrome (HCC)    Peripheral neuropathy    TIA (transient ischemic attack)    TIA symptoms from hypotension   Significant Hospital Events: Including procedures, antibiotic start and stop dates in addition to other pertinent events   6/19 Admitted with AMS and sepsis from UTI.  6/20 Still  obtunded. Has proteus and staph species in blood. MRI ordered. Cont CSF antimicrobial coverage. Feeding tube placed. On LTM. Renal fxn a little worse getting urine studies and renal US . Had perc nephrostomy tube placed by IR. 6/24: significant increase in O2 requirement overnight now on 35L HHFNC from 4L yesterday. Had bedside GOC discussion with wife. Made DNR/DNI. Continuing all other care for now.  6/25: needs echo/repeat cultures for s.hominis x2 bottles. Unclear source. Adding vanc. Oxygen requirements still high.  6/27 adding anxiolytics   Interim History / Subjective:  Moaning more this morning  Wife and daughter at bedside   Objective:   Blood pressure (!) 151/65, pulse 74, temperature 98.9 F (37.2 C), temperature source Axillary, resp. rate (!) 23, weight 71.9 kg, SpO2 97%.    FiO2 (%):  [65 %-90 %] 65 %   Intake/Output Summary (Last 24 hours) at 03/04/2024 1138 Last data filed at 03/04/2024 1000 Gross per 24 hour  Intake 1660.68 ml  Output 1450 ml  Net 210.68 ml   Filed Weights   03/02/24 0609 03/03/24 0352 03/04/24 0500  Weight: 74.5 kg 70.9 kg 71.9 kg   Physical Examination: General: Acutely and chronically ill elderly M in bed  HEENT: [place]. HHFNC in place. Dry mm.  Neuro: Does not awaken or follow commands. Constant groaning and grunting  CV: rr  PULM: Accessory muscle use. rhonchi  GI soft ndnt  Extremities: BLE pitting edema Skin: flushed   Resolved problem List:  Shock resolved  Assessment and Plan:   DNR status  GOC -continuing abx, supportive care as below. Family does not want him to suffer and if he is declining further or failing to improve likely comfort transition. They do feel he is declining 6/27, but are waiting for out of town family to arrive later today from [place]  before considering change in goc   Acute metabolic encephalopathy - suspect driven by sepsis  Dementia Parkinson's Mood disorder  Possible sz  P -sinemet , prozac   -sepsis as  below   Septic shock 2/2 proteus bacteremia and possible staph hominis bacteremia  R pyelonephritis due to obstructing stone s/p perc nephrostomy tube 6/20  Aspiration PNA  Lactic acidosis  -shock improved  P -cont rocephin  -repeat Bcx are NGTD -- will dc vanc  -cont florinef , midodrine    Acute hypoxic resp failure 2/2 aspiration PNA  P - DNR/DNI  -NPO, pulm hygiene  -wean O2 for goal > 92  -in d/w family 6/27, we will adjust medications to see if we can alleviate some of his WOB. Trial oxy. Have also added ativan .   Hypernatremia Hypokalemia  P -incr FWF  -replace K  Hyperglycemia -SSI   HLD - statin  Anemia P -follow PRN   Severe kcal malnutrition P -supportive care   Pressure injuries POA  P - Wound care per WOC recommendations - Pressure-relieving devices  Best Practice: (right click and Reselect all SmartList Selections daily)   Diet/type: tubefeeds and NPO DVT prophylaxis prophylactic heparin   Pressure ulcer(s): present on admission  GI prophylaxis: N/A Lines: N/A Foley:  N/A Code Status:  limited Last date of multidisciplinary goals of care discussion [6/24 GOC discussion with wife on rounds, made DNR/DNI]  Signature:   CRITICAL CARE Performed by: Ronnald FORBES Gave   Total critical care time: 46 minutes  Critical care time was exclusive of separately billable procedures and treating other patients. Critical care was necessary to treat or prevent imminent or life-threatening deterioration.  Critical care was time spent personally by me on the following activities: development of treatment plan with patient and/or surrogate as well as nursing, discussions with consultants, evaluation of patient's response to treatment, examination of patient, obtaining history from patient or surrogate, ordering and performing treatments and interventions, ordering and review of laboratory studies, ordering and review of radiographic studies, pulse oximetry and  re-evaluation of patient's condition.  Ronnald Gave MSN, AGACNP-BC [place] [facility] Amion for pager  03/04/2024, 11:38 AM

## 2024-03-04 NOTE — IPAL (Addendum)
  Interdisciplinary Goals of Care Family Meeting   Date carried out: 03/04/2024  Location of the meeting: Conference room  Member's involved: Nurse Practitioner and Family Member or next of kin  Durable Power of Attorney or Environmental health practitioner: wife    Discussion: We discussed goals of care for Alex Frazier .  Discussed goals of care, clinical case. Decision reached to transition to comfort care. We talked about what this entails. All questions answered  Expect in-hospital death, likely hours-day(s)   -dilaudid  gtt PRN BZD (please let Elink know if this needs to escalate to gtt for sx control)  -dc non-comfort meds, labs, imaging. Dc HHFNC, can use regular [place] for comfort but no sat goal.   -will plan to keep in ICU overnight, see how he is doing before deciding on txf to palli floor   Code status:   Code Status: Do not attempt resuscitation (DNR) - Comfort care   Disposition: In-patient comfort care  Time spent for the meeting: 18 min     Alex Frazier Gave, NP  03/04/2024, 6:19 PM

## 2024-03-05 DIAGNOSIS — R569 Unspecified convulsions: Secondary | ICD-10-CM | POA: Diagnosis not present

## 2024-03-05 DIAGNOSIS — J690 Pneumonitis due to inhalation of food and vomit: Secondary | ICD-10-CM | POA: Diagnosis not present

## 2024-03-05 DIAGNOSIS — G9341 Metabolic encephalopathy: Secondary | ICD-10-CM | POA: Diagnosis not present

## 2024-03-05 DIAGNOSIS — B964 Proteus (mirabilis) (morganii) as the cause of diseases classified elsewhere: Secondary | ICD-10-CM | POA: Diagnosis not present

## 2024-03-05 DIAGNOSIS — A4159 Other Gram-negative sepsis: Secondary | ICD-10-CM | POA: Diagnosis not present

## 2024-03-05 DIAGNOSIS — Z515 Encounter for palliative care: Secondary | ICD-10-CM | POA: Diagnosis not present

## 2024-03-05 DIAGNOSIS — Z7189 Other specified counseling: Secondary | ICD-10-CM | POA: Diagnosis not present

## 2024-03-07 LAB — CULTURE, BLOOD (ROUTINE X 2): Culture: NO GROWTH

## 2024-03-08 NOTE — Progress Notes (Signed)
 Patient comfortable at this time, dilaudid  gtt continuing. Family at bedside.

## 2024-03-08 NOTE — Progress Notes (Signed)
 Daily Progress Note   Patient Name: Alex Frazier       Date: March 19, 2024 DOB: 18-Jul-1946  Age: 78 y.o. MRN#: [id number] Attending Physician: Neda Jennet LABOR, MD Primary Care Physician: Duanne Butler DASEN, MD Admit Date: 02/25/2024  Reason for Consultation/Follow-up: Establishing goals of care   Length of Stay: 9  Current Medications: Scheduled Meds:   Chlorhexidine  Gluconate Cloth  6 each Topical Daily   heparin  injection (subcutaneous)  5,000 Units Subcutaneous Q8H   liver oil-zinc  oxide   Topical BID   sodium chloride  flush  5 mL Intracatheter Q8H    Continuous Infusions:  HYDROmorphone  3 mg/hr (March 19, 2024 0453)    PRN Meds: artificial tears, glycopyrrolate **OR** glycopyrrolate **OR** glycopyrrolate, HYDROmorphone , HYDROmorphone  (DILAUDID ) injection, LORazepam , midazolam , mouth rinse  Physical Exam          Vital Signs: BP (!) 127/52   Pulse 71   Temp 98.6 F (37 C) (Oral)   Resp 20   Wt 71.9 kg   SpO2 94%   BMI 25.58 kg/m  SpO2: SpO2: 94 % O2 Device: O2 Device: Heated High Flow Nasal Cannula O2 Flow Rate: O2 Flow Rate (L/min): 40 L/min       Palliative Assessment/Data: 10%      Patient Active Problem List   Diagnosis Date Noted   Protein-calorie malnutrition, severe 02/26/2024   Seizure (HCC) 02/25/2024   Dementia associated with Parkinson disease (HCC)    Elevated BP without diagnosis of hypertension 05/15/2023   COVID-19 virus infection 05/14/2023   Sepsis secondary to UTI (HCC) 05/14/2023   Hypocalcemia 05/14/2023   Pulmonary nodule    Lumbar compression fracture (HCC) 10/26/2019   Autonomic dysfunction 08/19/2019   Arterial hypotension 08/19/2019   DDD (degenerative disc disease), cervical 04/07/2016   Parkinson's disease (HCC) 07/08/2011    Idiopathic polyneuropathy 07/08/2011   Bulging discs    Gastritis    Hyperlipidemia     Palliative Care Assessment & Plan   Patient Profile: 78 y.o. male with past medical history of Parkinson's disease, dementia, peripheral neuropathy, orthostatic hypotension, HLD admitted on 02/25/2024 with AMS, fatigue, weakness found to have sepsis due to proteus bacteremia from R pyelonephritis with obstructing stone. Acute respiratory decline now requiring 35L/70% with concern for aspiration pneumonia in the setting of Parkinson's disease.   Today's Discussion: Chart reviewed. Patient  transitioned to full comfort measures last night after his daughter arrived from [place] . Patient on continuous dilaudid  infusion. He received prn versed , ativan , and robinul in the last 24 hours. The patient appears more comfortable today than he did yesterday. His breathing is even and unlabored. He has several family members at bedside including his wife, daughters, and granddaughter. They are glad he is comfortable at this time. Answered questions about medications and expectations at end-of-life. Gave family Gone from my sight pamphlet. Emotional support and therapeutic listening provided. Discussed self-care and encouraged family members to eat and get rest. Encouraged family to call PMT with questions or needs.  PMT will continue to follow.  Recommendations/Plan: Full comfort care Anticipate hospital death PMT will continue to follow    Code Status:    Code Status Orders  (From admission, onward)           Start     Ordered   03/04/24 1816  Do not attempt resuscitation (DNR) - Comfort care  Continuous       Question Answer Comment  If patient has no pulse and is not breathing Do Not Attempt Resuscitation   In Pre-Arrest Conditions (Patient Is Breathing and Has a Pulse) Provide comfort measures. Relieve any mechanical airway obstruction. Avoid transfer unless required for comfort.   Consent: Discussion  documented in EHR or advanced directives reviewed      03/04/24 1816         Extensive chart review has been completed prior to seeing the patient including progress/consult notes, orders, medications, and available advance directive documents.  Care plan was discussed with Dr. Neda and bedside RN  Time spent: 35 minutes  Thank you for allowing the Palliative Medicine Team to assist in the care of this patient.    Stephane CHRISTELLA Palin, NP  Please contact Palliative Medicine Team phone at [phone] for questions and concerns.

## 2024-03-08 NOTE — Progress Notes (Signed)
 Subjective/Chief Complaint:  1 - RIGHT Ureteral STone -s/p IR neph tube 6/21 in setting of possible urosepsis and obstructing stone. IN good position by US  6/26.  2 - Sepsis, Possible Urinary Source - pt with SIRS on admission and ?septic stone and/or aspiration pneumonia. UCX 619 proteus, FU UCX and BCX negative.  3 - End of Life Care - dementia and severe parkinson's at baseline. Pt with minmal neuro function this hospitalization. Pt now comfrot care as of 6/27, awaiting daughter from [facility].  TOday Mr. Wenke remains critically ill and encephalopathic. No high grade fevers. Neph tube and foley working well.    Objective: Vital signs in last 24 hours: Temp:  [98.6 F (37 C)-99.3 F (37.4 C)] 98.6 F (37 C) (06/27 1600) Pulse Rate:  [64-75] 71 (06/27 1800) Resp:  [15-40] 20 (06/27 1800) BP: (105-151)/(46-65) 127/52 (06/27 1800) SpO2:  [92 %-98 %] 94 % (06/27 1800) FiO2 (%):  [65 %-67 %] 67 % (06/27 1400) Last BM Date : 03/04/24  Intake/Output from previous day: 06/27 0701 - April 04, 2024 0700 In: 1290 [NG/GT:1035; IV Piggyback:250] Out: 475 [Urine:475] Intake/Output this shift: No intake/output data recorded.  Non-responseive. No distress. Numerous famiily members at bedside who are very pleasant and helpful with history. Non-labored breathing on RA RRR SNTND Rt neph tube in place with light pink non-foul urine Male external urine collection device in place on LWS with yellow urine  Lab Results:  Recent Labs    03/03/24 0501 03/04/24 0610  WBC 13.8* 12.3*  HGB 12.9* 10.2*  HCT 40.8 31.2*  PLT 176 278   BMET Recent Labs    03/03/24 0501 03/04/24 0610  NA 149* 151*  K 3.5 3.2*  CL 110 112*  CO2 25 27  GLUCOSE 141* 129*  BUN 64* 58*  CREATININE 1.70* 1.47*  CALCIUM  8.2* 8.1*   PT/INR No results for input(s): LABPROT, INR in the last 72 hours. ABG No results for input(s): PHART, HCO3 in the last 72 hours.  Invalid input(s): PCO2,  PO2  Studies/Results: DG CHEST PORT 1 VIEW Result Date: 03/04/2024 CLINICAL DATA:  Pneumonia. EXAM: PORTABLE CHEST 1 VIEW COMPARISON:  March 01, 2024. FINDINGS: The heart size and mediastinal contours are within normal limits. Feeding tube tip is seen in proximal stomach. Stable diffuse lung opacities concerning for edema or possibly pneumonia. Small right pleural effusion is noted. The visualized skeletal structures are unremarkable. IMPRESSION: Stable bilateral lung opacities concerning for edema. Small right pleural effusion. Electronically Signed   By: Lynwood Landy Raddle M.D.   On: 03/04/2024 08:19   US  RENAL Result Date: 03/03/2024 CLINICAL DATA:  Nephrostomy. EXAM: RENAL / URINARY TRACT ULTRASOUND COMPLETE COMPARISON:  Renal ultrasound and CT 02/26/2024. FINDINGS: Right Kidney: Renal measurements: 12.5 by 5.9 x 6.0 cm = volume: 222.4 mL. Echogenicity within normal limits. No mass or hydronephrosis visualized. Left Kidney: Renal measurements: 11.4 x 5.5 x 5.4 cm = volume: 170.3 mL. Echogenicity within normal limits. No mass or hydronephrosis visualized. Bladder: Bladder is mildly distended. Slight wall thickening. Layering debris suggested. Other: None. IMPRESSION: No collecting system dilatation. Slight urinary bladder wall thickening with some layering debris. Electronically Signed   By: Ranell Bring M.D.   On: 03/03/2024 11:08    Anti-infectives: Anti-infectives (From admission, onward)    Start     Dose/Rate Route Frequency Ordered Stop   03/03/24 1000  vancomycin  (VANCOREADY) IVPB 750 mg/150 mL  Status:  Discontinued        750 mg 150 mL/hr  over 60 Minutes Intravenous Every 24 hours 03/02/24 1137 03/04/24 1114   03/02/24 1200  vancomycin  (VANCOREADY) IVPB 1500 mg/300 mL        1,500 mg 150 mL/hr over 120 Minutes Intravenous  Once 03/02/24 0826 03/02/24 1500   02/28/24 0200  cefTRIAXone  (ROCEPHIN ) 2 g in sodium chloride  0.9 % 100 mL IVPB  Status:  Discontinued        2 g 200 mL/hr over  30 Minutes Intravenous Every 24 hours 02/27/24 1051 02/27/24 1051   02/28/24 0200  cefTRIAXone  (ROCEPHIN ) 2 g in sodium chloride  0.9 % 100 mL IVPB  Status:  Discontinued        2 g 200 mL/hr over 30 Minutes Intravenous Every 24 hours 02/27/24 1051 03/04/24 1818   02/27/24 1115  vancomycin  (VANCOREADY) IVPB 500 mg/100 mL  Status:  Discontinued        500 mg 100 mL/hr over 60 Minutes Intravenous  Once 02/27/24 1027 02/27/24 1058   02/27/24 0200  acyclovir  (ZOVIRAX ) 600 mg in dextrose  5 % 100 mL IVPB  Status:  Discontinued        600 mg 112 mL/hr over 60 Minutes Intravenous Every 24 hours 02/26/24 0829 02/27/24 1049   02/26/24 1600  ampicillin  (OMNIPEN) 2 g in sodium chloride  0.9 % 100 mL IVPB  Status:  Discontinued        2 g 300 mL/hr over 20 Minutes Intravenous Every 8 hours 02/26/24 0829 02/27/24 1049   02/26/24 0200  cefTRIAXone  (ROCEPHIN ) 2 g in sodium chloride  0.9 % 100 mL IVPB  Status:  Discontinued        2 g 200 mL/hr over 30 Minutes Intravenous Every 12 hours 02/26/24 0038 02/27/24 1051   02/26/24 0200  ampicillin  (OMNIPEN) 2 g in sodium chloride  0.9 % 100 mL IVPB  Status:  Discontinued        2 g 300 mL/hr over 20 Minutes Intravenous Every 6 hours 02/26/24 0038 02/26/24 0829   02/26/24 0200  acyclovir  (ZOVIRAX ) 600 mg in dextrose  5 % 100 mL IVPB  Status:  Discontinued        600 mg 112 mL/hr over 60 Minutes Intravenous Every 12 hours 02/26/24 0046 02/26/24 0829   02/26/24 0130  vancomycin  (VANCOREADY) IVPB 500 mg/100 mL        500 mg 100 mL/hr over 60 Minutes Intravenous  Once 02/26/24 0038 02/26/24 0208   02/26/24 0056  vancomycin  variable dose per unstable renal function (pharmacist dosing)  Status:  Discontinued         Does not apply See admin instructions 02/26/24 0057 02/27/24 1059   02/25/24 1800  piperacillin -tazobactam (ZOSYN ) IVPB 3.375 g  Status:  Discontinued        3.375 g 100 mL/hr over 30 Minutes Intravenous Every 6 hours 02/25/24 1542 02/25/24 1632   02/25/24  1645  piperacillin -tazobactam (ZOSYN ) IVPB 3.375 g        3.375 g 100 mL/hr over 30 Minutes Intravenous  Once 02/25/24 1632 02/25/24 1914   02/25/24 1600  vancomycin  (VANCOCIN ) IVPB 1000 mg/200 mL premix        1,000 mg 200 mL/hr over 60 Minutes Intravenous  Once 02/25/24 1549 02/25/24 1914       Assessment/Plan:  Stone temporixed with neph tube. NO furher procedural intervention as now comfort care. Should pt make dramatic recovery, will condier ureteroscopy to stone free in elective setting. Pt's family has good understanding of competing risks and desires only comfort care measures at this point.  Ricardo KATHEE Alvaro Mickey. 2024/03/31

## 2024-03-08 NOTE — Progress Notes (Addendum)
 Patient comfort care. Dilaudid  gtt infusing as ordered, patient in no distress and resting comfortably. At 1209, patient asystole on the cardiac monitor. Upon assessment, patient with absent vital signs and fixed pupils. No heartbeat or breath sounds auscultated. Patient pronounced by this RN and Lamar Reveal RN. TOD 1209. Dr. Neda aware. Next of kin at bedside.    2:54 PM  45cc of Dilaudid  wasted in med room steri cycle by Bri RN and Karleen PEAK

## 2024-03-08 NOTE — Progress Notes (Signed)
   NAME:  Alex Frazier, MRN:  [id number], DOB:  [birth year], LOS: 9 ADMISSION DATE:  02/25/2024, CONSULTATION DATE:  02/25/2024 REFERRING MD:  Randol - EDP, CHIEF COMPLAINT: Acute encephalopathy  History of Present Illness:  Patient is a 78 year old male with PMHx significant for Parkinson's disease, dementia, peripheral neuropathy, orthostatic hypotension, HLD presents to [facility] on 6/19 with AMS.  On 6/19 patient having complaints of fatigue and weakness.  Per patient's wife patient is altered but more altered than normal.  LKN 6/18 10 p.m.  EMS called finding patient had tremors and hypertensive SBP > 200.  Given clonidine .  Patient also noted to be febrile 104F.  Transferred to [facility] ED.  On arrival neuro exam showing left upward gaze deviation patient altered not following commands but moving extremities spontaneously.  Patient also with diffuse shaking. CT Head with no acute abnormality.  Concerned for possible seizure, given Ativan . Loaded with Keppra . Neuro consulted. Glucose 133.  VBG 7.44, 30, 31, 20.  BP stable and tachy 110s.  Tmax 105.80F and WBC 5.8.  Cultures obtained and placed on broad-spectrum antibiotics.  Given IV fluids.  LA 7.9 then 6.7.  UA with large leukocytes.  COVID/flu/RSV negative.  CXR unremarkable. Given AMS, elevated LA, and need for possible EEG, PCCM consulted for ICU admission.  Pertinent Medical History:   Past Medical History:  Diagnosis Date   B12 deficiency    Borderline   Bulging discs    Degenerative disc disease, cervical    Dementia associated with Parkinson disease (HCC)    Gastritis    Hyperlipidemia    Neuropathy    Parkinson's disease (HCC)    Parkinsonian syndrome (HCC)    Peripheral neuropathy    TIA (transient ischemic attack)    TIA symptoms from hypotension   Significant Hospital Events: Including procedures, antibiotic start and stop dates in addition to other pertinent events   6/19 Admitted with AMS and sepsis from UTI.  6/20 Still  obtunded. Has proteus and staph species in blood. MRI ordered. Cont CSF antimicrobial coverage. Feeding tube placed. On LTM. Renal fxn a little worse getting urine studies and renal US . Had perc nephrostomy tube placed by IR. 6/24: significant increase in O2 requirement overnight now on 35L [facility] from 4L yesterday. Had bedside GOC discussion with wife. Made DNR/DNI. Continuing all other care for now.  6/25: needs echo/repeat cultures for s.hominis x2 bottles. Unclear source. Adding vanc. Oxygen requirements still high.  6/27 adding anxiolytics  6/28-was transition to comfort measures  Interim History / Subjective:  Was transition to comfort measures Appears comfortable  Objective:   Blood pressure (!) 127/52, pulse 71, temperature 98.6 F (37 C), temperature source Oral, resp. rate 20, weight 71.9 kg, SpO2 94%.    FiO2 (%):  [65 %-67 %] 67 %   Intake/Output Summary (Last 24 hours) at March 12, 2024 0751 Last data filed at 03/04/2024 1800 Gross per 24 hour  Intake 1290 ml  Output 475 ml  Net 815 ml   Filed Weights   03/02/24 0609 03/03/24 0352 03/04/24 0500  Weight: 74.5 kg 70.9 kg 71.9 kg   Physical Examination: Patient appears comfortable Family at bedside  Resolved problem List:  Shock resolved  Assessment and Plan:   Comfort measures only Metabolic encephalopathy Dementia Parkinson's Mood disorder Possible seizures Septic shock Aspiration pneumonitis Pyelonephritis Bacteremia with Proteus mirabilis Acute hypoxemic respiratory failure  Focus on comfort measures

## 2024-03-08 DEATH — deceased

## 2024-03-22 ENCOUNTER — Telehealth: Payer: Self-pay

## 2024-03-22 NOTE — Patient Outreach (Signed)
 Received a voice message from wife Alex Frazier requesting a return call. Placed a successful outbound call to wife Alex Frazier who advised me the patient passed away on 2024-03-23.   Alex Ly RN BSN CCM [place]  [facility], [facility] Nurse Care Coordinator  Direct Dial: [phone] Website: [email or web address]

## 2024-04-01 ENCOUNTER — Telehealth: Payer: Self-pay

## 2024-04-08 NOTE — Death Summary Note (Signed)
 DEATH SUMMARY   Patient Details  Name: Alex Frazier MRN: [id number] DOB: [birth year]  Admission/Discharge Information   Admit Date:  03-21-2024  Date of Death: Date of Death: 2024-03-30  Time of Death: Time of Death: Apr 21, 1208  Length of Stay: 2024-04-10  Referring Physician: Duanne Butler DASEN, MD   Reason(s) for Hospitalization  Patient was brought into the hospital for altered mental status  Diagnoses  Preliminary cause of death:  Sepsis secondary to pyelonephritis caused by Proteus mirabilis Secondary Diagnoses (including complications and co-morbidities):  Principal Problem:   Seizure (HCC) Active Problems:   Hyperlipidemia   Autonomic dysfunction   Idiopathic polyneuropathy   Dementia associated with Parkinson disease (HCC)   Protein-calorie malnutrition, severe Pyelonephritis Hypernatremia Metabolic encephalopathy  Brief Hospital Course (including significant findings, care, treatment, and services provided and events leading to death)  Alex Frazier is a 78 y.o. year old male who brought into the hospital for altered mental status. At presentation did have uncontrolled blood pressures, high fever at 104, gaze deviation will concerns for seizures Received benzodiazepines, loaded with Keppra . Altered mental status related to sepsis from urinary tract infection, blood cultures positive for Proteus Mirabilis and staph species. Nephrostomy tube was placed following discovery of an obstructive stone on 6/20 Initiated and continued on antibiotics Required pressors Worsening respiratory failure with worsening oxygen requirement-on high flow oxygen supplementation, aggressive pulmonary hygiene-with concerns for aspiration pneumonia Goals of care discussions was had, CODE STATUS was changed. Patient continued to worsen and with ongoing goals of care discussions, was transition to comfort measures  Patient succumbed to his illness at 04-21-08 on Mar 30, 2024    Pertinent Labs and Studies   Significant Diagnostic Studies DG CHEST PORT 1 VIEW Result Date: 03/04/2024 CLINICAL DATA:  Pneumonia. EXAM: PORTABLE CHEST 1 VIEW COMPARISON:  March 01, 2024. FINDINGS: The heart size and mediastinal contours are within normal limits. Feeding tube tip is seen in proximal stomach. Stable diffuse lung opacities concerning for edema or possibly pneumonia. Small right pleural effusion is noted. The visualized skeletal structures are unremarkable. IMPRESSION: Stable bilateral lung opacities concerning for edema. Small right pleural effusion. Electronically Signed   By: Lynwood Landy Raddle M.D.   On: 03/04/2024 08:19   US  RENAL Result Date: 03/03/2024 CLINICAL DATA:  Nephrostomy. EXAM: RENAL / URINARY TRACT ULTRASOUND COMPLETE COMPARISON:  Renal ultrasound and CT 02/26/2024. FINDINGS: Right Kidney: Renal measurements: 12.5 by 5.9 x 6.0 cm = volume: 222.4 mL. Echogenicity within normal limits. No mass or hydronephrosis visualized. Left Kidney: Renal measurements: 11.4 x 5.5 x 5.4 cm = volume: 170.3 mL. Echogenicity within normal limits. No mass or hydronephrosis visualized. Bladder: Bladder is mildly distended. Slight wall thickening. Layering debris suggested. Other: None. IMPRESSION: No collecting system dilatation. Slight urinary bladder wall thickening with some layering debris. Electronically Signed   By: Ranell Bring M.D.   On: 03/03/2024 11:08   ECHOCARDIOGRAM COMPLETE Result Date: 03/02/2024    ECHOCARDIOGRAM REPORT   Patient Name:   Alex Frazier Date of Exam: 03/02/2024 Medical Rec #:  [id number]      Height:       66.0 in Accession #:    [id number]     Weight:       164.2 lb Date of Birth:  [birth year]      BSA:          1.839 m Patient Age:    78 years       BP:  161/80 mmHg Patient Gender: M              HR:           71 bpm. Exam Location:  Inpatient Procedure: 2D Echo, Cardiac Doppler and Color Doppler (Both Spectral and Color            Flow Doppler were utilized during procedure). Indications:     Endocarditis  History:        Patient has prior history of Echocardiogram examinations, most                 recent 11/03/2019.  Sonographer:    Benard Stallion Referring Phys: [id number] TINNIE FORBES FURTH IMPRESSIONS  1. Left ventricular ejection fraction, by estimation, is 60 to 65%. The left ventricle has normal function. The left ventricle has no regional wall motion abnormalities. Left ventricular diastolic parameters are indeterminate.  2. Right ventricular systolic function is normal. The right ventricular size is normal.  3. The mitral valve is normal in structure. Trivial mitral valve regurgitation. No evidence of mitral stenosis.  4. The aortic valve is normal in structure. Aortic valve regurgitation is not visualized. No aortic stenosis is present. Conclusion(s)/Recommendation(s): No evidence of valvular vegetation. FINDINGS  Left Ventricle: Left ventricular ejection fraction, by estimation, is 60 to 65%. The left ventricle has normal function. The left ventricle has no regional wall motion abnormalities. The left ventricular internal cavity size was normal in size. There is  no left ventricular hypertrophy. Left ventricular diastolic parameters are indeterminate. Right Ventricle: The right ventricular size is normal. No increase in right ventricular wall thickness. Right ventricular systolic function is normal. Left Atrium: Left atrial size was normal in size. Right Atrium: Right atrial size was normal in size. Pericardium: There is no evidence of pericardial effusion. Mitral Valve: The mitral valve is normal in structure. Trivial mitral valve regurgitation. No evidence of mitral valve stenosis. Tricuspid Valve: The tricuspid valve is normal in structure. Tricuspid valve regurgitation is mild . No evidence of tricuspid stenosis. Aortic Valve: The aortic valve is normal in structure. Aortic valve regurgitation is not visualized. No aortic stenosis is present. Aortic valve mean gradient measures 4.7 mmHg.  Aortic valve peak gradient measures 8.3 mmHg. Aortic valve area, by VTI measures 2.26 cm. Pulmonic Valve: The pulmonic valve was normal in structure. Pulmonic valve regurgitation is not visualized. No evidence of pulmonic stenosis. Aorta: The aortic root is normal in size and structure. Venous: The inferior vena cava was not well visualized. IAS/Shunts: No atrial level shunt detected by color flow Doppler.  LEFT VENTRICLE PLAX 2D LVIDd:         4.30 cm   Diastology LVIDs:         2.90 cm   LV e' medial:    6.22 cm/s LV PW:         1.10 cm   LV E/e' medial:  17.0 LV IVS:        1.10 cm   LV e' lateral:   9.01 cm/s LVOT diam:     1.90 cm   LV E/e' lateral: 11.8 LV SV:         64 LV SV Index:   35 LVOT Area:     2.84 cm  RIGHT VENTRICLE RV S prime:     13.10 cm/s TAPSE (M-mode): 2.4 cm LEFT ATRIUM             Index        RIGHT ATRIUM  Index LA diam:        3.70 cm 2.01 cm/m   RA Area:     11.40 cm LA Vol (A2C):   54.6 ml 29.69 ml/m  RA Volume:   23.20 ml  12.61 ml/m LA Vol (A4C):   33.9 ml 18.43 ml/m LA Biplane Vol: 43.3 ml 23.54 ml/m  AORTIC VALVE AV Area (Vmax):    2.40 cm AV Area (Vmean):   2.21 cm AV Area (VTI):     2.26 cm AV Vmax:           144.00 cm/s AV Vmean:          99.767 cm/s AV VTI:            0.285 m AV Peak Grad:      8.3 mmHg AV Mean Grad:      4.7 mmHg LVOT Vmax:         122.00 cm/s LVOT Vmean:        77.900 cm/s LVOT VTI:          0.227 m LVOT/AV VTI ratio: 0.80  AORTA Ao Root diam: 3.40 cm Ao Asc diam:  3.30 cm MITRAL VALVE                TRICUSPID VALVE MV Area (PHT): 3.48 cm     TR Peak grad:   33.2 mmHg MV Decel Time: 218 msec     TR Vmax:        288.00 cm/s MV E velocity: 106.00 cm/s MV A velocity: 89.20 cm/s   SHUNTS MV E/A ratio:  1.19         Systemic VTI:  0.23 m                             Systemic Diam: 1.90 cm Morene Brownie Electronically signed by Morene Brownie Signature Date/Time: 03/02/2024/4:08:41 PM    Final    DG CHEST PORT 1 VIEW Result Date:  03/01/2024 EXAM: 1 VIEW XRAY OF THE CHEST 03/01/2024 04:40:00 AM COMPARISON: 1 view chest x-ray 02/25/2024. CLINICAL HISTORY: 200808 Hypoxia 200808. FINDINGS: LUNGS AND PLEURA: Diffuse interstitial edema is new. Right greater than left pleural effusions are present. HEART AND MEDIASTINUM: No acute abnormality of the cardiac and mediastinal silhouettes. BONES AND SOFT TISSUES: No acute osseous abnormality. Small or fading tube projects over the stomach. IMPRESSION: 1. New diffuse interstitial edema. 2. Right greater than left pleural effusions. 3. Findings are consistent with volume overload and congestive heart failure. Infection is not excluded. Electronically signed by: Lonni Necessary MD 03/01/2024 05:27 AM EDT RP Workstation: [id number]   IR NEPHROSTOMY PLACEMENT RIGHT Result Date: 02/27/2024 CLINICAL DATA:  Sepsis, obstructing mid right ureteral calculus with mild hydronephrosis, suspected hydronephrosis EXAM: RIGHT PERCUTANEOUS NEPHROSTOMY CATHETER PLACEMENT UNDER ULTRASOUND AND FLUOROSCOPIC GUIDANCE FLUOROSCOPY: Radiation Exposure Index (as provided by the fluoroscopic device): 3.7 mGy air Kerma TECHNIQUE: Rightflank region prepped with chlorhexidine , draped in usual sterile fashion, infiltrated locally with 1% lidocaine . The patient was already receiving adequate prophylactic antibiotic coverage. Under real-time ultrasound guidance, a 21-gauge trocar needle was advanced into a posterior lower pole calyx. Ultrasound image documentation was saved. Urine spontaneously returned through the needle. Needle was exchanged over a guidewire for transitional dilator. Contrast injection confirmed appropriate positioning. Catheter was exchanged over a guidewire for a 10 French pigtail catheter, formed centrally within the right renal collecting system. Contrast injection confirms appropriate positioning and patency. Catheter secured externally with 0 silk  suture and placed to external drain bag. COMPLICATIONS:  COMPLICATIONS none IMPRESSION: 1. Technically successful right percutaneous nephrostomy catheter placement. Electronically Signed   By: JONETTA Faes M.D.   On: 02/27/2024 20:43   Overnight EEG with video Result Date: 02/27/2024 Shelton Arlin KIDD, MD     02/28/2024  9:14 AM Patient Name: SHAQUAN MISSEY MRN: [id number] Epilepsy Attending: Arlin KIDD Shelton Referring Physician/Provider: Jerrie Lola CROME, MD Duration: 02/26/2024 9056 to 02/27/2024 1130 Patient history: 77yo noted to have an episode of being nearly unresponsive having diffuse tremors with left gaze deviation. EEG to evaluate for seizure. Level of alertness: Awake/ lethargic AEDs during EEG study: None Technical aspects: This EEG study was done with scalp electrodes positioned according to the 10-20 International system of electrode placement. Electrical activity was reviewed with band pass filter of 1-70Hz , sensitivity of 7 uV/mm, display speed of 67mm/sec with a 60Hz  notched filter applied as appropriate. EEG data were recorded continuously and digitally stored.  Video monitoring was available and reviewed as appropriate. Description: EEG showed continuous generalized 3 to 6 Hz theta-delta slowing, at times with triphasic morphology. Hyperventilation and photic stimulation were not performed.   EEG was disconnected on 02/26/2024 between 1214 to 1348 and 1911 to 2009 due to testing ABNORMALITY - Continuous slow, generalized IMPRESSION: This study is  suggestive of moderate to severe diffuse encephalopathy. No seizures or epileptiform discharges were seen throughout the recording. Priyanka KIDD Shelton   US  RENAL Result Date: 02/26/2024 CLINICAL DATA:  Acute renal insufficiency. EXAM: RENAL / URINARY TRACT ULTRASOUND COMPLETE COMPARISON:  CT of earlier today FINDINGS: Right Kidney: Renal measurements: 12.0 x 5.9 x 4.9 cm = volume: 181 mL. Hydronephrosis on CT has resolved. Normal echogenicity. Left Kidney: Renal measurements: 10.8 x 6.0 x 4.4 cm = volume: 148  mL. No hydronephrosis. Normal echogenicity. Bladder: Collapsed around a Foley catheter. Other: None. IMPRESSION: No acute process or explanation for renal insufficiency. Right-sided hydronephrosis has resolved since prior CT. Electronically Signed   By: Rockey Kilts M.D.   On: 02/26/2024 18:17   CT ABDOMEN PELVIS WO CONTRAST Result Date: 02/26/2024 CLINICAL DATA:  Sepsis EXAM: CT ABDOMEN AND PELVIS WITHOUT CONTRAST TECHNIQUE: Multidetector CT imaging of the abdomen and pelvis was performed following the standard protocol without IV contrast. RADIATION DOSE REDUCTION: This exam was performed according to the departmental dose-optimization program which includes automated exposure control, adjustment of the mA and/or kV according to patient size and/or use of iterative reconstruction technique. COMPARISON:  CT abdomen and pelvis 03/17/2020. FINDINGS: Lower chest: There is bilateral lower lobe airspace consolidation, right greater than left. There are trace bilateral pleural effusions. Hepatobiliary: No focal liver abnormality is seen. No gallstones, gallbladder wall thickening, or biliary dilatation. Pancreas: Unremarkable. No pancreatic ductal dilatation or surrounding inflammatory changes. Spleen: Normal in size without focal abnormality. Adrenals/Urinary Tract: The bladder is completely decompressed by Foley catheter. There is a 6 mm calculus in the proximal right ureter. There is mild right-sided hydronephrosis. Additionally, there is a hyperdensities seen throughout the ureter and renal collecting system proximal to this level. Additional punctate nonobstructing bilateral renal calculi are seen. There is mild right perinephric and proximal periureteral stranding and fluid. There is no left-sided hydronephrosis. The adrenal glands are within normal limits. Stomach/Bowel: There is a large amount of stool throughout the entire colon. Rectosigmoid colon is dilated and stool-filled. There is diffuse distal sigmoid  colon and rectal wall thickening and surrounding inflammatory stranding. There is no bowel obstruction, pneumatosis or free air.  The appendix is not visualized. Nasogastric tube tip is in the body of stomach. Vascular/Lymphatic: Aortic atherosclerosis. No enlarged abdominal or pelvic lymph nodes. Reproductive: Prostate gland smaller absent. There is likely a small right hydrocele. Other: There is mild body wall edema.  There is no ascites. Musculoskeletal: L1, L2 and L5 compression deformities are stable. L3 compression deformity has mildly increased compared to prior. T11 compression deformity has mildly increased from prior. The There is a new left gluteal walled low-attenuation intramuscular collection measures 8.3 x 5.0 x 7.0 cm. IMPRESSION: 1. There is a 6 mm calculus in the proximal right ureter with mild right-sided hydronephrosis. There is also hyperdensity throughout the right ureter and renal collecting system proximal to this level which may represent blood products. Correlate clinically for infection. 2. Additional nonobstructing bilateral renal calculi. 3. Dilated stool-filled rectosigmoid colon with wall thickening inflammation compatible with stercoral colitis. 4. New left gluteal walled low-attenuation intramuscular collection measuring 8.3 x 5.0 x 7.0 cm. Findings may represent hematoma or abscess. Of the etiologies not excluded. 5. Bilateral lower lobe airspace consolidation, right greater than left, worrisome for pneumonia. Trace bilateral pleural effusions. 6. Mild body wall edema. 7. Aortic atherosclerosis. Aortic Atherosclerosis (ICD10-I70.0). Electronically Signed   By: Greig Pique M.D.   On: 02/26/2024 17:00   DG Abd Portable 1V Result Date: 02/26/2024 CLINICAL DATA:  Enteric tube placement EXAM: PORTABLE ABDOMEN - 1 VIEW COMPARISON:  abdominal radiograph dated 02/26/2024 at 4:13 a.m. has been replaced and FINDINGS: Gastric/enteric tube tip projects over the gastric fundus.  Nonobstructive bowel gas pattern. Partially imaged large volume stool within the lower abdomen. IMPRESSION: Gastric/enteric tube tip projects over the gastric fundus. Electronically Signed   By: Limin  Xu M.D.   On: 02/26/2024 15:44   MR BRAIN W WO CONTRAST Result Date: 02/26/2024 EXAM: MRI BRAIN WITH AND WITHOUT CONTRAST 02/26/2024 01:15:45 PM TECHNIQUE: Multiplanar multisequence MRI of the head/brain was performed with and without the administration of intravenous contrast. COMPARISON: Head CT 02/25/2024, MRI brain 10/28/2019. CLINICAL HISTORY: Meningitis/CNS infection suspected. FINDINGS: BRAIN AND VENTRICLES: Punctate focus of restricted diffusion in the subcortical white matter underlying the left precentral gyrus seen on axial image 42 series 2, consistent with acute infarct. Background of moderate chronic small vessel disease. Chronic microhemorrhage along the anterior right insula. 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 Generalized volume loss within the expected range for patient age. No acute intracranial hemorrhage. No mass effect or midline shift. No hydrocephalus. The sella is unremarkable. Normal flow voids. No mass or abnormal enhancement. ORBITS: No acute abnormality. SINUSES: No acute abnormality. BONES AND SOFT TISSUES: Normal bone marrow signal and enhancement. No acute soft tissue abnormality. IMPRESSION: 1. No acute findings of meningitis or CNS infection. 2. 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 3. Moderate chronic small vessel disease. Electronically signed by: Ryan Chess MD 02/26/2024 02:16 PM EDT RP Workstation: [id number]   DG Abd Portable 1V Result Date: 02/26/2024 CLINICAL DATA:  78 year old male NG tube placement. EXAM: PORTABLE ABDOMEN - 1 VIEW COMPARISON:  CT Abdomen and Pelvis 03/17/2020. FINDINGS: Portable AP semi upright view at 0413 hours. Enteric tube courses through the central mediastinum,  terminating in the left upper quadrant at the level of the stomach. The side hole is at or near the GEJ. Stomach appears decompressed. Nonobstructed visible bowel gas pattern. Lung bases appear stable since 2021. Normal visible mediastinal contours. No acute osseous abnormality identified. IMPRESSION: 1. Enteric tube terminates in the  stomach, but side hole is at or near the GEJ. Recommend advancing 5 cm to ensure side hole placement within the stomach. 2.  Non obstructed bowel gas pattern. Electronically Signed   By: VEAR Hurst M.D.   On: 02/26/2024 04:24   CT Head Wo Contrast Result Date: 02/25/2024 CLINICAL DATA:  Seizure, new-onset, no history of trauma EXAM: CT HEAD WITHOUT CONTRAST TECHNIQUE: Contiguous axial images were obtained from the base of the skull through the vertex without intravenous contrast. RADIATION DOSE REDUCTION: This exam was performed according to the departmental dose-optimization program which includes automated exposure control, adjustment of the mA and/or kV according to patient size and/or use of iterative reconstruction technique. COMPARISON:  CT of the head dated May 14, 2023. FINDINGS: Brain: Generalized cerebral volume loss. Mild-to-moderate periventricular white matter disease. No evidence of hemorrhage, mass, cortical infarct or hydrocephalus. Vascular: Calcific atheromatous disease within the carotid siphons. Skull: Intact and unremarkable. Sinuses/Orbits: Clear paranasal sinuses. Status post bilateral lens replacement. Other: None. IMPRESSION: 1. Age related atrophy and mild to moderate periventricular white matter disease, compatible with chronic small vessel disease. Electronically Signed   By: Evalene Coho M.D.   On: 02/25/2024 16:29   DG Chest Portable 1 View Result Date: 02/25/2024 CLINICAL DATA:  Hypoxia. EXAM: PORTABLE CHEST 1 VIEW COMPARISON:  05/14/2023 FINDINGS: Normal sized heart. Poor inspiration. Minimal linear atelectasis/scarring in the left lower  lung zone. Normal vasculature. No pleural fluid. Diffuse osteopenia. IMPRESSION: 1. Poor inspiration. 2. Minimal linear atelectasis/scarring in the left lower lung zone. Electronically Signed   By: Elspeth Bathe M.D.   On: 02/25/2024 16:09    Microbiology No results found for this or any previous visit (from the past 240 hours).  Lab Basic Metabolic Panel: No results for input(s): NA, K, CL, CO2, GLUCOSE, BUN, CREATININE, CALCIUM , MG, PHOS in the last 168 hours. Liver Function Tests: No results for input(s): AST, ALT, ALKPHOS, BILITOT, PROT, ALBUMIN in the last 168 hours. No results for input(s): LIPASE, AMYLASE in the last 168 hours. No results for input(s): AMMONIA in the last 168 hours. CBC: No results for input(s): WBC, NEUTROABS, HGB, HCT, MCV, PLT in the last 168 hours. Cardiac Enzymes: No results for input(s): CKTOTAL, CKMB, CKMBINDEX, TROPONINI in the last 168 hours. Sepsis Labs: No results for input(s): PROCALCITON, WBC, LATICACIDVEN in the last 168 hours.  Procedures/Operations   Nephrostomy tube placement 02/26/2024  Breion Novacek A Staton Markey 03/17/2024, 6:20 AM

## 2024-06-10 ENCOUNTER — Ambulatory Visit: Admitting: Family Medicine
